# Patient Record
Sex: Female | Born: 1955 | Race: White | Hispanic: No | Marital: Married | Smoking: Former smoker
Health system: Southern US, Community
[De-identification: ages and names within clinical notes are randomized; demographics above are authoritative.]

## PROBLEM LIST (undated history)

## (undated) ENCOUNTER — Emergency Department (HOSPITAL_COMMUNITY): Admission: EM | Payer: BC Managed Care – PPO

## (undated) DIAGNOSIS — F319 Bipolar disorder, unspecified: Secondary | ICD-10-CM

## (undated) DIAGNOSIS — I10 Essential (primary) hypertension: Secondary | ICD-10-CM

## (undated) DIAGNOSIS — F32A Depression, unspecified: Secondary | ICD-10-CM

## (undated) DIAGNOSIS — J45909 Unspecified asthma, uncomplicated: Secondary | ICD-10-CM

## (undated) DIAGNOSIS — F419 Anxiety disorder, unspecified: Secondary | ICD-10-CM

## (undated) DIAGNOSIS — M1990 Unspecified osteoarthritis, unspecified site: Secondary | ICD-10-CM

## (undated) DIAGNOSIS — D649 Anemia, unspecified: Secondary | ICD-10-CM

## (undated) HISTORY — PX: TUBAL LIGATION: SHX77

## (undated) HISTORY — PX: BREAST SURGERY: SHX581

## (undated) HISTORY — PX: ABDOMINAL HYSTERECTOMY: SHX81

## (undated) HISTORY — PX: OTHER SURGICAL HISTORY: SHX169

---

## 2005-08-06 ENCOUNTER — Encounter: Admission: RE | Admit: 2005-08-06 | Discharge: 2005-08-06 | Payer: Self-pay | Admitting: Surgery

## 2005-08-06 ENCOUNTER — Ambulatory Visit (HOSPITAL_COMMUNITY): Admission: RE | Admit: 2005-08-06 | Discharge: 2005-08-06 | Payer: Self-pay | Admitting: Surgery

## 2005-08-07 ENCOUNTER — Ambulatory Visit (HOSPITAL_COMMUNITY): Admission: RE | Admit: 2005-08-07 | Discharge: 2005-08-07 | Payer: Self-pay | Admitting: Surgery

## 2005-10-22 ENCOUNTER — Encounter: Admission: RE | Admit: 2005-10-22 | Discharge: 2006-01-20 | Payer: Self-pay | Admitting: Surgery

## 2005-11-03 ENCOUNTER — Inpatient Hospital Stay (HOSPITAL_COMMUNITY): Admission: RE | Admit: 2005-11-03 | Discharge: 2005-11-05 | Payer: Self-pay | Admitting: Surgery

## 2005-11-04 ENCOUNTER — Encounter: Payer: Self-pay | Admitting: Vascular Surgery

## 2005-12-14 ENCOUNTER — Encounter: Admission: RE | Admit: 2005-12-14 | Discharge: 2006-03-14 | Payer: Self-pay | Admitting: Surgery

## 2005-12-28 ENCOUNTER — Emergency Department (HOSPITAL_COMMUNITY): Admission: EM | Admit: 2005-12-28 | Discharge: 2005-12-28 | Payer: Self-pay | Admitting: *Deleted

## 2006-05-11 ENCOUNTER — Encounter: Admission: RE | Admit: 2006-05-11 | Discharge: 2006-05-11 | Payer: Self-pay | Admitting: Family Medicine

## 2006-10-26 ENCOUNTER — Encounter: Admission: RE | Admit: 2006-10-26 | Discharge: 2006-10-26 | Payer: Self-pay | Admitting: Obstetrics and Gynecology

## 2006-12-23 ENCOUNTER — Emergency Department (HOSPITAL_COMMUNITY): Admission: EM | Admit: 2006-12-23 | Discharge: 2006-12-23 | Payer: Self-pay | Admitting: Emergency Medicine

## 2008-01-16 ENCOUNTER — Emergency Department (HOSPITAL_COMMUNITY): Admission: EM | Admit: 2008-01-16 | Discharge: 2008-01-16 | Payer: Self-pay | Admitting: Emergency Medicine

## 2008-01-17 ENCOUNTER — Ambulatory Visit (HOSPITAL_COMMUNITY): Admission: EM | Admit: 2008-01-17 | Discharge: 2008-01-18 | Payer: Self-pay | Admitting: Emergency Medicine

## 2008-04-17 ENCOUNTER — Inpatient Hospital Stay (HOSPITAL_COMMUNITY): Admission: RE | Admit: 2008-04-17 | Discharge: 2008-04-18 | Payer: Self-pay | Admitting: Obstetrics and Gynecology

## 2008-12-19 ENCOUNTER — Emergency Department (HOSPITAL_COMMUNITY): Admission: EM | Admit: 2008-12-19 | Discharge: 2008-12-19 | Payer: Self-pay | Admitting: Emergency Medicine

## 2010-09-02 NOTE — H&P (Signed)
[name], [name]                 ACCOUNT NO.:  [id number]   MEDICAL RECORD NO.:  [id number]          PATIENT TYPE:  AMB   LOCATION:  SDC                           FACILITY:  [facility]   PHYSICIAN:  Gerald Leitz, MD          DATE OF BIRTH:  [birth year]   DATE OF ADMISSION:  DATE OF DISCHARGE:                              HISTORY & PHYSICAL   The patient scheduled for surgery on April 17, 2008.   HISTORY OF PRESENT ILLNESS:  This is a 55 year old G3, P1-0-0-1 with  cystocele and rectocele with desires treatment via anterior-posterior  repair.   PAST GYN HISTORY:  Hysterectomy in 1990 secondary to menorrhagia with  total abdominal hysterectomy, bilateral oophorectomy performed at that  time as well.  No history of sexually transmitted diseases.   PAST MEDICAL HISTORY:  Depression and migraine headaches.   PAST OB HISTORY:  Spontaneous vaginal delivery x1 and miscarriage x2.   PAST SURGICAL HISTORY:  Abdominal hysterectomy and bilateral  oophorectomy in 1990, gastric bypass surgery in July 2007, bowel  obstruction in October 2009, hemorrhoidectomy x2 one in 1977 and one in  1983.   CURRENT MEDICATIONS:  Cymbalta, Topamax, and Abilify.   ALLERGIES:  No known drug allergies.   SOCIAL HISTORY:  The patient is married.  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  She denies tobacco use and  alcohol use.  No illicit drug use.  The patient had to be primary  caregiver for her mother who has a history of uterine and colon cancer.   FAMILY HISTORY:  Mother with uterine and colon cancer.  No history of  ovarian cancer.  No family history of breast cancer.   REVIEW OF SYSTEMS:  Negative except as stated in the history of present  illness.   PHYSICAL EXAMINATION:  VITAL SIGNS:  Blood pressure 130/64 and weight  175 pounds.  CARDIOVASCULAR:  Regular rate and rhythm.  LUNGS:  Clear to auscultation bilaterally.  ABDOMEN:  Soft, nontender and nondistended.  No masses.  PELVIC:   Normal external female genitalia.  The patient has underlying  moderate cystocele as well as a rectocele on exam.  Bimanual exam  reveals no masses.   IMPRESSION AND PLAN:  A 55 year old with cystocele and rectocele desires  treatment with anterior-posterior repair.  Risks, benefits, and  alternatives of the surgery were discussed with the patient including  but not limited to  infection and bleeding, damage to the bladder or rectum with the need  for further surgery, need for transfusion was discussed.  Risk of HIV  and hepatitis B and C.  The patient was understanding of all risk and  desires to proceed with anterior-posterior repair.      Gerald Leitz, MD  Electronically Signed     TC/MEDQ  D:  04/04/2008  T:  04/05/2008  Job:  [id number]

## 2010-09-02 NOTE — Op Note (Signed)
[name], [name]                 ACCOUNT NO.:  [id number]   MEDICAL RECORD NO.:  [id number]          PATIENT TYPE:  INP   LOCATION:  5118                         FACILITY:  [facility]   PHYSICIAN:  Sandria Bales. Ezzard Standing, M.D.  DATE OF BIRTH:  1956-02-21   DATE OF PROCEDURE:  01/17/2008  DATE OF DISCHARGE:                               OPERATIVE REPORT   Date of surgery ??   PREOPERATIVE DIAGNOSIS:  Abdominal pain status post rule out Roux-en-Y  gastric bypass.   POSTOPERATIVE DIAGNOSIS:  Small bowel obstruction secondary to adhesive  band.   Anatomy of gastric bypass appears normal.  No internal hernia.   PROCEDURE:  Laparoscopic lysis of adhesions.   SURGEON:  Sandria Bales. Ezzard Standing, MD   ASSISTANT:  None.   ANESTHESIA:  General endotracheal.   ESTIMATED BLOOD LOSS:  Minimal.   PROCEDURE:  [name] is a 55 year old white female, the patient of Dr.  Marny Lowenstein, who had a laparoscopic Roux-en-Y gastric bypass on November 03, 2005.  She has successfully lost over 100 pounds of weight and has  done very well from her bypass surgery.  She developed abdominal pain  yesterday and went to the emergency room, the films were nondiagnostic.  She returned to the emergency room today with continued abdominal pain.   I discussed with her and her husband about proceeding with laparoscopic  exploration.  I expressed concerns for possible internal hernias or  other cause for possible bowel obstruction, also talked about a possible  of open surgery, the risk of bowel resection, and upper endoscopy as  nothing else proves to be obvious.   OPERATIVE NOTE:  The patient was placed in supine position and given a  general endotracheal anesthetic.  She had both her arms tucked by her  side.  Her abdomen was prepped with Betadine solution, sterilely draped  and a time-out was held to identify the patient and the procedure.  She  was given 1 g of cefoxitin at the initiation of procedure.   I accessed the  abdominal cavity through an infraumbilical incision with  sharp dissection carried down to the abdominal cavity.  A 0-degree 10-mm  laparoscope was inserted through a 12-mm Hasson trocar.  A 5-mm trocar  was placed in the right lower quadrant of the abdomen.  A 5-mm trocar in  the left upper quadrant of the abdomen.   An abdominal exploration was carried out.  Her right and left lobes of  liver were unremarkable with the gallbladder that I could see was  unremarkable.  She did have dilated proximal small bowel started  actually up in her gastric pouch and followed the small bowel down into  what looked like it first I thought maybe an internal hernia, but  actually proved to be an adhesive band between 2 loops of bowel.  I was  able to expose the adhesive band and cut it without difficulty.  I then  was able to run her gastric limb all the way down to the  jejunojejunostomy, which looked good.  I saw no evidence of any  mesenteric hernia, though I was a little careful in trying to pull up on  the small bowel because it was dilated.  I thought I had identified the  cause of the obstruction.  I went back and went distally down her common  channel and back of her biliary channel and all these channels all  looked good.  I think I released the obstruction.   I irrigated the wounds, re-inspected the bowel that had been trapped  which looked okay.  I then removed the trocar and closed the umbilical  port with a 0 Vicryl suture, closed the skin fold with a 5-0 Vicryl  suture painted the wounds with a tincture of benzoin and Steri-Strips  and the patient tolerated the procedure well.   She was transported to recovery room in good condition.  Sponge and  needle counts were correct at the end of the case.      Sandria Bales. Ezzard Standing, M.D.  Electronically Signed     DHN/MEDQ  D:  01/17/2008  T:  01/18/2008  Job:  [id number]   cc:   Jethro Bastos, M.D.

## 2010-09-02 NOTE — H&P (Signed)
[name], [name]                 ACCOUNT NO.:  [id number]   MEDICAL RECORD NO.:  [id number]          PATIENT TYPE:  INP   LOCATION:  5118                         FACILITY:  [facility]   PHYSICIAN:  Sandria Bales. Ezzard Standing, M.D.  DATE OF BIRTH:  1955-11-05   DATE OF ADMISSION:  01/17/2008  DATE OF DISCHARGE:                              HISTORY & PHYSICAL   Date of H&P ??   HISTORY OF PRESENT ILLNESS:  This is a 55 year old white female who is a  patient of Dr. Marny Lowenstein who underwent a laparoscopic Roux-en-Y  gastric bypass on November 03, 2005, for morbid obesity.  Her initial weight  was 289 pounds with a BMI of 43.8.  I last saw her in May 2008 with a  weight of 176 pounds and a BMI of 26.7.   She did very well until Monday, January 16, 2008.  She was eating egg  at work where she started developing epigastric pain.  Because of  worsening pain, she came to the [facility] Emergency Room approximately  at 1:00 p.m. and stayed until 7:00 p.m. on Monday, January 16, 2008.  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 Arline Asp Romine for further evaluation of this.  Her pain seemed to get  better with pain medication; however, she had worsening pain once the  medicine wore off, and then today when speaking to Dr. Leda Quail  could not be seen for several days, came back to the [facility]  Emergency Room this time with worsening abdominal pain.   She has had some nausea and vomiting.  It is specific in her epigastric  and the left upper quadrant.  She has had no other GI problems other  than her bypass surgery.   She denies any liver disease, gallbladder disease, pancreatic disease,  or colon disease.   PAST MEDICAL HISTORY:  She has, I do not think, any allergies.   CURRENT MEDICATIONS:  1. Cymbalta.  2. Abilify.  3. Topamax.   REVIEW OF SYMPTOMS:  NEUROLOGIC:  She has had  headaches, seen Dr. Merceda Elks about a year ago, and uses Topamax as a prevention for headaches.  CARDIAC:  She has had no heart disease or chest pain.  PULMONARY:  She quit smoking for the bariatric surgery and then she  restarted smoking but quit smoking again this past December 2008.  She  has had no lung disease.  GASTROINTESTINAL:  See history of present illness.  UROLOGIC:  No history of kidney stones or kidney infections.  GYN:  She has had a hysterectomy in 1989.  EXTREMITIES:  Good strength in all 4 extremities.   Other surgeries had besides the bypass and hysterectomy, she had a  lumpectomy of her left breast and she had a hemorrhoidectomy x2, most  recently in 1983.   Her husband is at the bedside.  She works for [employer].   PHYSICAL EXAMINATION:  VITAL SIGNS:  Her temperature is  98.9, her pulse  is 70, respirations 18, and blood pressure 98/57.  HEENT:  Unremarkable.  NECK:  Supple.  I found no mass or thyromegaly.  LUNGS:  Clear to auscultation.  HEART:  Regular rate and rhythm without murmur or rub.  ABDOMEN:  She is tender on palpation in her epigastrium and the left  upper quadrant.  She has decreased but present bowel sounds.  I feel no  evidence of hernia, but she does have a little lipoma of her abdominal  wall, and this actually was seen on the CAT scan.  EXTREMITIES:  Good strength in all 4 extremities.  NEUROLOGIC:  Grossly intact.   I reviewed her ultrasound and CAT scan with Dr. Audie Pinto.  Even though  from a GI standpoint the CAT scan was read as normal, my concern is that  she appears to have stuff that hangs up near her jejunojejunostomy.  I  wonder whether she has either an internal hernia, some external band, or  some problem with her anastomosis.   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   Her white blood count is 13,000 with 81% neutrophils.  Her hemoglobin is  14 and hematocrit 43.   Sodium 137, potassium 3.9, chloride of 105,  glucose of 116.  Her liver functions were normal except for mildly  depressed protein of 5.9, lipase was 24.   IMPRESSION:  1. Status post Roux-en-Y gastric bypass with acute abdominal pain.      Concern for internal hernia versus some kind of partial      obstruction.  I discussed with the patient both laparoscopic and open repairs and the  possibility of open surgery and resection.  I think she understands all  this.  Risks include bleeding, infection, leakage from the bowel, and  complications that cannot be resolved with surgery.  1. Quit smoking for the second time in December.  2. Recurrent headaches.  3. Anxiety, on Cymbalta.  4. Lipoma of abdominal wall.  5. Desmoid of right adnexal area.      Sandria Bales. Ezzard Standing, M.D.  Electronically Signed     DHN/MEDQ  D:  01/17/2008  T:  01/18/2008  Job:  [id number]   cc:   Jethro Bastos, M.D.  Lum Keas, MD

## 2010-09-02 NOTE — Op Note (Signed)
NAMEALVETA, [name]                 ACCOUNT NO.:  [id number]   MEDICAL RECORD NO.:  [id number]          PATIENT TYPE:  OIB   LOCATION:  9315                          FACILITY:  [facility]   PHYSICIAN:  Gerald Leitz, MD          DATE OF BIRTH:  April 03, 1956   DATE OF PROCEDURE:  04/17/2008  DATE OF DISCHARGE:                               OPERATIVE REPORT   PREOPERATIVE DIAGNOSES:  1. Cystocele.  2. Varicocele.   POSTOPERATIVE DIAGNOSES:  1. Cystocele.  2. Varicocele.   PROCEDURE:  Anterior posterior repair.   SURGEON:  Gerald Leitz, MD   ESTIMATED BLOOD LOSS:  100 mL.   ANESTHESIA:  General.   COMPLICATIONS:  None.   SPECIMENS:  None.   PROCEDURE:  The patient was taken to the operating room where she was  placed under general anesthesia.  She was placed in a dorsal lithotomy  position and prepped and draped in the usual sterile fashion.  In the  ER, catheterization was performed prior to beginning the procedure.  The  vaginal cuff was grasped with Allis clamps.  Incision was made into the  anterior portion of the vaginal cuff with scalpel.  The vesicovaginal  space was developed by dissecting off the endopelvic connective tissue  from the vaginal epithelium using Metzenbaum scissors.  The pubocervical  septum was identified.  The pubocervical fascia was reapproximated using  interrupted 2-0 sutures of 0 Vicryl.  Cystocele was introduced.  A  redundant vaginal epithelium was excised.  The vaginal epithelium had  been closed with 2-0 Vicryl in a running lock fashion.  Excellent  hemostasis was noted.  Attention was turned to the rectocele where a  triangular incision was made in the perineum with the scalpel.  The  rectocele was dissected off from the vaginal mucosa using Metzenbaum  scissors.  Allis clips were used for retraction.  The rectovaginal  septum was identified and reapproximated with interrupted sutures of 3-0  Vicryl.  Incisions were tied and adequate support was noted.   The  rectocele was reduced.  The vaginal epithelium was reapproximated with  running stitch of 2-0  Vicryl.  Perineal body was reattached with 4-0 Vicryl.  Vaginal packing  with Estrace cream was placed.  Lap and needle counts were correct x2.  The patient was awakened from the anesthesia and returned to the  recovery room in awake and stable condition.      Gerald Leitz, MD  Electronically Signed     TC/MEDQ  D:  04/17/2008  T:  04/18/2008  Job:  [id number]

## 2010-09-02 NOTE — H&P (Signed)
[name], [name]                 ACCOUNT NO.:  [id number]   MEDICAL RECORD NO.:  [id number]          PATIENT TYPE:  INP   LOCATION:  5118                         FACILITY:  [facility]   PHYSICIAN:  Maisie Fus A. Cornett, M.D.DATE OF BIRTH:  [birth year]   DATE OF ADMISSION:  01/17/2008  DATE OF DISCHARGE:                              HISTORY & PHYSICAL   REQUESTING PHYSICIAN:  Dr. Dianne Dun in the ER.   SURGEON:  Maisie Fus A. Cornett, MD   GASTRIC BYPASS SURGEON:  Sandria Bales. Ezzard Standing, MD   CHIEF COMPLAINT:  Epigastric abdominal pain.   HISTORY OF PRESENT ILLNESS:  [name] is a 55 year old white female  with a history of obesity and gastric bypass surgery approximately 2  years ago by Dr. Ezzard Standing.  The patient is no longer overweight.  Currently, the patient presents today to the Emergency Department with a  2-day history of severe abdominal pain.  She states that she began  having this pain yesterday around 8:30 in the morning after eating an  almond.  This pain is described as very severe epigastric pain.  She  states that this pain radiates to both her right upper quadrant and her  left upper quadrant as well as to her back.  Later, she went to work,  however, ended up having to leave work due to the severity of pain.  Due  to this amount of pain, the patient presented to [facility],  where a CT scan of the abdomen and pelvis as well as transvaginal  ultrasound and a pelvic ultrasound were completed, which were all  essentially normal except for a right dermoid ovarian cyst.  At that  time, the patient was sent home with a prescription for Percocet for  pain, which did not help her pain at all.  She states her last bowel  movement was yesterday.  She did pass some flatus yesterday, however,  has not passed any flatus today.  This morning she woke up, and her pain  persisted.  She states that due to continued worsening in her pain, she  presented back to the Emergency Department  today.  At this time, she had  a complete abdominal ultrasound, which was negative showing no  pericholecystic fluid, no gallstones, no common bile duct dilatation,  and no other intra-abdominal abnormalities on ultrasound.  At this time,  her LFTs and all other labs were essentially normal with the exception  of a white blood cell count of 13,000 as well as a neutrophil count of  81%.  The patient has had nausea as well as dry heaving.  At this time,  we were asked to see the patient by the emergency room physician who had  thought that this abdominal pain may be related to the patient's history  of gastric bypass surgery.   REVIEW OF SYSTEMS:  Apparently, the patient has had a decrease in urine  output within the past several days.  She states she had a small amount  last night as well as this morning, but otherwise has not been able to  urinate  since.  Otherwise, please see HPI.  The patient denies any chest  pain or shortness of breath.  Otherwise, all other systems are negative.   FAMILY HISTORY:  Her mother has a history of ovarian as well as cervical  cancer, which later metastasized to her spine.  Also, a history with her  mother of primary colon cancer.   PAST MEDICAL HISTORY:  1. History of obesity for which she is status post gastric bypass and      no longer obese.  2. Irritable bowel syndrome.  3. Depression.   PAST SURGICAL HISTORY:  Gastric bypass surgery.   SOCIAL HISTORY:  The patient is married.  She recently stopped smoking  this past December 2008.  She has been an occasional drinker  approximately 1-2 times a week approximately 4-5 beers each time,  however, she states that she does not do this every week.  She currently  works for a [employer].   ALLERGIES:  NKDA.   MEDICATIONS:  Cymbalta, Topamax, Abilify, calcium, iron, multivitamin,  as well as a probiotic, doses are unknown.   PHYSICAL EXAMINATION:  GENERAL:  [name] is a 55 year old  white  female, who is a very pleasant, lying in bed currently, and in mild  distress.  VITAL SIGNS:  Temperature 99.5, blood pressure 115/76, pulse 72,  respirations 18.  EYES:  Sclerae nonicteric.  Pupils were equal, round, and reactive to  light.  EARS, NOSE, and THROAT:  Without any obvious rashes or lesions.  No  rhinorrhea.  Throat shows no exudate.  MOUTH:  Pink and moist.  NECK:  Supple.  Trachea is midline.  No thyromegaly.  HEART:  Regular rate and rhythm.  Normal S1 and S2.  No murmurs,  gallops, or rubs were noted.  LUNGS:  Clear to auscultation bilaterally.  No wheezes, rhonchi, or  rales are noted.  Respiratory effort is nonlabored.  ABDOMEN:  Soft.  Extremely tender in the epigastric region as well as  the right upper quadrant and left upper quadrant.  The patient does have  active guarding as well as rebound tenderness.  Currently, she does not  have any bowel sounds and is nondistended.  Otherwise, a small little  mass is noted under one of her prior laparoscopic scars, which I feel at  this time is probably some scar tissue.  Otherwise, no other masses or  hernias are felt.  MUSCULOSKELETAL:  All four extremities are symmetrical.  No cyanosis,  clubbing, or edema.  SKIN:  Warm and dry.  No obvious masses, lesions, or rashes.  NEUROLOGIC:  Cranial nerves II through XII appear to be grossly intact.  PSYCH:  The patient is alert and oriented x3 with appropriate affect.   LABS AND DIAGNOSTICS:  White blood count 13,000, hemoglobin 14.7,  hematocrit 43.6, platelet count 249,000, neutrophils 81%.  Sodium 137,  potassium 3.9, glucose 116, BUN 8, creatinine 0.63.  LFTs are all  normal.  Lipase 24.   DIAGNOSTICS:  An acute abdominal series shows no acute cardiopulmonary  abnormalities, nonobstructive bowel-gas pattern, and no free air.  Abdominal ultrasound shows a normal-appearing gallbladder with no  gallbladder wall thickening, no pericholecystic fluid, no common bile   duct dilatation, and no gallstones.  No other intraabdominal  abnormalities were seen on ultrasound either.   IMPRESSION:  1. Epigastric abdominal pain with unknown etiology.  2. Depression.  3. Dehydration.   PLAN:  At this time, I have discussed this case with Dr. Ezzard Standing, who  is  the surgeon who performed Ms. Krejci's gastric bypass surgery  approximately 2 years ago.  At this time, due to her severe pain as well  as her diagnostics and labs, it appear to be ruling out problems with  possible cholecystitis or any other bowel obstruction type symptoms.  Dr. Ezzard Standing feels that this patient probably needs to go to the OR  tonight for a diagnostic laparoscopy.  Due to the patient's history of  gastric bypass surgery, it does put her at increased risk for a  possibility of an internal hernia as well as possible gastric ulcers.  At this time, we will prophylactically give the patient 1 g of cefoxitin  on-call to the OR.  We will also give her various p.r.n. medications  such as Dilaudid, Zofran, and Phenergan for nausea as well as pain.  Otherwise, I have explained the procedure to the patient and described  some other risks and benefits for this procedure and she currently  wishes to proceed.  A consent for a laparoscopy with possible  laparotomy, possible endoscopy, and possible small bowel resection has  been written for as well.      Letha Cape, PA      Maisie Fus A. Cornett, M.D.  Electronically Signed    KEO/MEDQ  D:  01/17/2008  T:  01/18/2008  Job:  [id number]   cc:   Sandria Bales. Ezzard Standing, M.D.  Jethro Bastos, M.D.

## 2010-09-05 NOTE — Discharge Summary (Signed)
NAMEKORRI, ASK                 ACCOUNT NO.:  [id number]   MEDICAL RECORD NO.:  [id number]          PATIENT TYPE:  INP   LOCATION:  9315                          FACILITY:  [facility]   PHYSICIAN:  Gerald Leitz, MD          DATE OF BIRTH:  Jul 14, 1955   DATE OF ADMISSION:  04/17/2008  DATE OF DISCHARGE:  04/18/2008                               DISCHARGE SUMMARY   INDICATION FOR ADMISSION:  1. Cystocele.  2. Rectocele.   DISCHARGE DIAGNOSES:  1. Cystocele.  2. Rectocele.  3. Status post anterior and posterior repair.   BRIEF HOSPITAL COURSE:  The patient underwent anterior and posterior  repair on April 17, 2008.  She did well postoperatively, was  discharged home on postop day #1.  Hemoglobin at discharge was 11.8.  Pain was well controlled.  She is scheduled to follow up for  postoperative visit in 2 weeks and discharged home on the following  medications, Motrin and Percocet.   CONDITION AT DISCHARGE:  Stable and improved.      Gerald Leitz, MD  Electronically Signed     TC/MEDQ  D:  06/02/2008  T:  06/03/2008  Job:  [id number]

## 2010-09-05 NOTE — Op Note (Signed)
[name], [name]                 ACCOUNT NO.:  [id number]   MEDICAL RECORD NO.:  [id number]          PATIENT TYPE:  INP   LOCATION:  1517                         [facility]AC[facility]   PHYSICIAN:  Sharlet Salina T. Hoxworth, M.D.DATE OF BIRTH:  1956/03/28   DATE OF PROCEDURE:  11/03/2005  DATE OF DISCHARGE:                                 OPERATIVE REPORT   PROCEDURE:  Upper GI endoscopy.   DESCRIPTION OF PROCEDURE:  Upper GI endoscopy is performed intraoperatively  at the completion of laparoscopic Roux-en-Y gastric bypass by Dr. Ovidio Kin.  With the outlet of the gastric pouch clamped by Dr. Ezzard Standing and with  the pouch and anastomosis under saline irrigation, the Olympus video  endoscope was passed into the upper esophagus and then advanced under direct  vision to the EG junction at 40 cm from the incisors.  The small gastric  pouch was entered and then tensely distended with air, and there was no  evidence of leak.  The anastomosis was visualized,  appeared patent.  Suture  and staple lines were intact and without bleeding.  The pouch was measured  at 4-5 cm in length.  Following completion of the procedure, the pouch was  desufflated and the scope withdrawn.      Lorne Skeens. Hoxworth, M.D.  Electronically Signed     BTH/MEDQ  D:  11/03/2005  T:  11/03/2005  Job:  [id number]

## 2010-09-05 NOTE — Op Note (Signed)
[name], [name]                 ACCOUNT NO.:  [id number]   MEDICAL RECORD NO.:  [id number]          PATIENT TYPE:  INP   LOCATION:  0001                         FACILITY:  [facility]   PHYSICIAN:  Sandria Bales. Ezzard Standing, M.D.  DATE OF BIRTH:  [birth year]   DATE OF PROCEDURE:  11/03/2005  DATE OF DISCHARGE:                                 OPERATIVE REPORT   PREOPERATIVE DIAGNOSIS:  Morbid obesity, weight 289, BMI 43.8.   POSTOPERATIVE DIAGNOSIS:  Morbid obesity, weight 289, BMI 43.8.   OPERATION PERFORMED:  Laparoscopic Roux-en-Y gastrojejunostomy (anticolic,  antigastric) and division of omentum.   SURGEON:  Sandria Bales. Ezzard Standing, M.D.   ASSISTANT:  Sharlet Salina T. Hoxworth, M.D.   ANESTHESIA:  General endotracheal.   ESTIMATED BLOOD LOSS:  Minimal.   INDICATIONS FOR PROCEDURE:  Ms. Wegman is a 55 year old white female who is  a patient of Jethro Bastos, M.D., who has been morbidly obese much of  her adult life.  She was into our preoperative bariatric program which has  included nutritional evaluation, psych evaluation, preop labs and x-rays.   She now comes for attempted laparoscopy Roux-en-Y gastric bypass.  The  indications and potential complications of the procedure were explained to  the patient.  Potential complications include but not limited to bleeding  infection, bowel leak or injury, deep venous thrombosis and long term  nutritional changes.   DESCRIPTION OF PROCEDURE:  Patient in supine position, given a general  endotracheal anesthesia.  She had PAS stockings in place, Foley catheter in  place, was given antibiotics preoperatively.   Her abdomen was prepped with Betadine solution and sterilely draped.  I  accessed her abdominal cavity with a 12 mm Opti-view trocar.  With this I  placed a 10 mm 30 degree laparoscope.  I placed of seven trocars.  There was  a 5 mm trocar lateral left upper quadrant. There was the Opti-view I just  described.  There was a trocar to the left  of midline in her upper abdomen.  There was a right paramedian trocar which was 12 mm.  There was a 12 mm  right subcostal, a 5 mm subxiphoid and a 10 mm to the left of the umbilicus.   Abdominal exploration was carried out initially.  The liver was noted to be  fatty, the stomach was unremarkable.  Her bowel was just covered with a  large amount of omentum.  She did have one attachment of the omentum down  toward her Pfannenstiel incision which was about 3 or 4 cm attachment.  I  took this down with a Harmonic scalpel.   I then went up and identified the ligament of Treitz and counted down 40 cm  down the jejunum.  I then divided the jejunum with a white load of the endo  GIA 45 mm stapler and took down the mesentery.  I then counted the future  gastric limb of 100 cm of jejunum.  I had marked the proximal gastric limb  with a Penrose drain and went down distally until I had counted 100 cm.  I  then a side-to-side jejunojejunostomy with a 45 mm stapler and I closed the  enterotomy with two running 2-0 Vicryl sutures.   I then closed the mesenteric defect with a running 2-0 silk suture with  Laparoties on both ends of this.   The patient was then placed in a reverse Trendelenburg position.  The  Nathanson retractor was placed through the subxiphoid 5 mm port and  retracted the left lobe of the liber which was moderately fatty in nature.   What was impressive, she had a lot of fat in her upper abdomen with a large  fat pad probably 3 or 4 cm that overlay her gastroesophageal junction.  I  first identified the angle of His in which I opened up a window about 2 cm  down along the left crus.  I then went to the lesser sac and tried to go  about 4 to 5 cm below the gastroesophageal junction and got into the lesser  sac through the lesser curvature of the stomach.   I actually took the anterior fat pad up about 2 cm to expose the anterior  wall of the stomach.   I then divided the  stomach first with a blue load of the 45 Endo GIA stapler  and I used two 60 staplers with the gold load.  Anesthesia then passed an  Ewald tube down into the stomach to make sure there was no compromise of the  esophagus and this passed easily.  Then I did my final firing with a 45 blue  load of the Endo GIA stapler.   So I created a new stomach pouch approximately 4 to 5 cm in length and 3 cm  in width and had disconnected distal stomach.  I did have a single bleeder  on the new pouch side which I put a stitch in which controlled the bleeding.  I then oversewed the gastric remnant using a locking 2-0 Vicryl suture.   I tried pulling up the jejunum to the stomach but there was a moderate  amount of tension and she had so much omentum I thought she would be best  served by having this divided, so I spent probably 15 minutes dividing the  omentum up to within about 1 or 2 cm of the colon.  I then brought up the  jejunum anticolic and antigastric and this time it reached much better and  did a J-hook which placed to the left side attached to the stomach pouch. I  put a posterior running 2-0 Vicryl suture.  I then did an Endo GIA stapler  trying to create about a 2.5 cm opening between the stomach and the jejunum.  There was some bleeding along the stomach side.  I closed the gastrojejunal  enterotomy with two running 2-0 Vicryl sutures and this controlled the  little bleeding on the stomach side.  I placed an extra stitch along one  area that I was worried about a possible leak.  I then did an anterior  running 2-0 Vicryl suture.   I then closed the Peterson's defect, finding the colon that I tacked to the  mesentery of the candy cane of the jejunum and then through the omentum and  fat.  I used Laparoties on this.   We then withdrew the Ewald tube.  Dr. Johna Sheriff broke scrub and went up and  did the upper endoscopy.  He identified about a 4 to 5 cm patch with a widely patent  gastrojejunostomy and  no evidence of bleeding.  While he  insufflated air, I grasped the small bowel and he put it under tension. I  flooded the upper abdomen with saline, saw no bubbles or evidence of air  leak.   He then removed the endoscope.  I then irrigated out the abdomen.  I placed  a seal to JJ anastomosis.  I placed a seal up along the new gastric pouch  which was right up at the angle of His and I finally placed Tisseel along  the gastrojejunostomy and over the jejunal stump.  I used a total of about 5  mL of Tisseel.   I then reinspected the divided omentum.  I saw no bleeding.  I looked back  down the pelvis and saw no bleeding where the omentum had been taken off the  anterior abdominal wall.  I then removed the trocars in turn under direct  visualization.  There was no bleeding at any trocar site.   The wounds were then cleaned off.  I then stapled each wound.  I had  infiltrated approximately 20 mL of 0.25% Marcaine in the different trocar  sites.  Sponge and needle counts were correct at the end of this case.  She  tolerated the procedure well and was transported to recovery room in good  condition.      Sandria Bales. Ezzard Standing, M.D.  Electronically Signed     DHN/MEDQ  D:  11/03/2005  T:  11/03/2005  Job:  [id number]   cc:   Jethro Bastos, M.D.  Fax: [phone]

## 2011-01-19 LAB — COMPREHENSIVE METABOLIC PANEL
ALT: 32
AST: 20
BUN: 8
CO2: 26
Calcium: 9.1
Chloride: 105
Creatinine, Ser: 0.63
GFR calc Af Amer: >60
Potassium: 3.9
Sodium: 137

## 2011-01-19 LAB — DIFFERENTIAL
Basophils Absolute: 0
Basophils Absolute: 0
Basophils Relative: 0
Basophils Relative: 0
Eosinophils Relative: 4
Lymphs Abs: 1.9
Monocytes Absolute: 0.4
Monocytes Relative: 5
Monocytes Relative: 6
Neutro Abs: 10.5 — ABNORMAL HIGH
Neutrophils Relative %: 81 — ABNORMAL HIGH

## 2011-01-19 LAB — CBC
HCT: 39.4
HCT: 43.6
Hemoglobin: 14.7
MCHC: 33.6
MCV: 97
Platelets: 239
WBC: 8.2

## 2011-01-19 LAB — URINALYSIS, ROUTINE W REFLEX MICROSCOPIC
Glucose, UA: NEGATIVE
Ketones, ur: 40 — AB
Specific Gravity, Urine: >1.046 — ABNORMAL HIGH
pH: 6

## 2011-01-19 LAB — BASIC METABOLIC PANEL
Calcium: 9.4
Chloride: 106
Creatinine, Ser: 0.7
Glucose, Bld: 113 — ABNORMAL HIGH
Potassium: 3.6

## 2011-01-19 LAB — HEPATIC FUNCTION PANEL
AST: 21
Bilirubin, Direct: 0.1

## 2011-01-23 LAB — CBC
MCHC: 33.6 g/dL (ref 30.0–36.0)
MCV: 97.4 fL (ref 78.0–100.0)
Platelets: 171 10*3/uL (ref 150–400)
RDW: 13.1 % (ref 11.5–15.5)
RDW: 13.5 % (ref 11.5–15.5)
WBC: 8.5 10*3/uL (ref 4.0–10.5)

## 2011-01-23 LAB — BASIC METABOLIC PANEL
BUN: 10 mg/dL (ref 6–23)
CO2: 26 mEq/L (ref 19–32)
Calcium: 8.4 mg/dL (ref 8.4–10.5)
Creatinine, Ser: 0.54 mg/dL (ref 0.4–1.2)
GFR calc Af Amer: >60 mL/min (ref >60)
Glucose, Bld: 89 mg/dL (ref 70–99)

## 2011-01-23 LAB — URINALYSIS, ROUTINE W REFLEX MICROSCOPIC
Protein, ur: NEGATIVE mg/dL
Urobilinogen, UA: 0.2 mg/dL (ref 0.0–1.0)

## 2011-01-23 LAB — TYPE AND SCREEN
ABO/RH(D): A POS
Antibody Screen: NEGATIVE

## 2020-02-24 ENCOUNTER — Inpatient Hospital Stay (HOSPITAL_COMMUNITY)
Admission: EM | Admit: 2020-02-24 | Discharge: 2020-03-01 | DRG: 493 | Disposition: A | Discharge status: Rehabilitation Facility | Payer: BC Managed Care – PPO | Attending: Internal Medicine | Admitting: Internal Medicine

## 2020-02-24 DIAGNOSIS — T148XXA Other injury of unspecified body region, initial encounter: Secondary | ICD-10-CM

## 2020-02-24 DIAGNOSIS — S82132A Displaced fracture of medial condyle of left tibia, initial encounter for closed fracture: Secondary | ICD-10-CM

## 2020-02-24 DIAGNOSIS — E559 Vitamin D deficiency, unspecified: Secondary | ICD-10-CM | POA: Diagnosis present

## 2020-02-24 DIAGNOSIS — F319 Bipolar disorder, unspecified: Secondary | ICD-10-CM | POA: Diagnosis present

## 2020-02-24 DIAGNOSIS — W010XXA Fall on same level from slipping, tripping and stumbling without subsequent striking against object, initial encounter: Secondary | ICD-10-CM | POA: Diagnosis present

## 2020-02-24 DIAGNOSIS — E222 Syndrome of inappropriate secretion of antidiuretic hormone: Secondary | ICD-10-CM | POA: Diagnosis present

## 2020-02-24 DIAGNOSIS — M79662 Pain in left lower leg: Secondary | ICD-10-CM | POA: Diagnosis not present

## 2020-02-24 DIAGNOSIS — Z79899 Other long term (current) drug therapy: Secondary | ICD-10-CM

## 2020-02-24 DIAGNOSIS — Z20822 Contact with and (suspected) exposure to covid-19: Secondary | ICD-10-CM | POA: Diagnosis present

## 2020-02-24 DIAGNOSIS — Y92008 Other place in unspecified non-institutional (private) residence as the place of occurrence of the external cause: Secondary | ICD-10-CM

## 2020-02-24 DIAGNOSIS — I10 Essential (primary) hypertension: Secondary | ICD-10-CM | POA: Diagnosis present

## 2020-02-24 DIAGNOSIS — S82142A Displaced bicondylar fracture of left tibia, initial encounter for closed fracture: Principal | ICD-10-CM | POA: Diagnosis present

## 2020-02-24 DIAGNOSIS — D62 Acute posthemorrhagic anemia: Secondary | ICD-10-CM

## 2020-02-24 DIAGNOSIS — Z87891 Personal history of nicotine dependence: Secondary | ICD-10-CM

## 2020-02-24 DIAGNOSIS — E871 Hypo-osmolality and hyponatremia: Secondary | ICD-10-CM | POA: Diagnosis present

## 2020-02-24 DIAGNOSIS — Z419 Encounter for procedure for purposes other than remedying health state, unspecified: Secondary | ICD-10-CM

## 2020-02-24 DIAGNOSIS — Z6835 Body mass index (BMI) 35.0-35.9, adult: Secondary | ICD-10-CM

## 2020-02-24 DIAGNOSIS — S82143A Displaced bicondylar fracture of unspecified tibia, initial encounter for closed fracture: Secondary | ICD-10-CM | POA: Diagnosis present

## 2020-02-24 DIAGNOSIS — G8918 Other acute postprocedural pain: Secondary | ICD-10-CM

## 2020-02-24 DIAGNOSIS — G4700 Insomnia, unspecified: Secondary | ICD-10-CM | POA: Diagnosis present

## 2020-02-24 DIAGNOSIS — F1020 Alcohol dependence, uncomplicated: Secondary | ICD-10-CM | POA: Diagnosis present

## 2020-02-24 DIAGNOSIS — K5900 Constipation, unspecified: Secondary | ICD-10-CM | POA: Diagnosis not present

## 2020-02-24 DIAGNOSIS — T502X5A Adverse effect of carbonic-anhydrase inhibitors, benzothiadiazides and other diuretics, initial encounter: Secondary | ICD-10-CM | POA: Diagnosis present

## 2020-02-24 NOTE — ED Triage Notes (Signed)
To triage via EMS.  Pt was transferred from EMS stretcher to ED stretcher.  Pt has h/o left femur fx that [place], [place] there but when came back to [place] surgery had to be redone.  Left leg is 6" shorter than right and has chronic low back and left leg pain.   Onset tonight pt was walking to [place] with no assistive devices and as she was stepping over grand kids and animals, she stepped down on left foot and immediately felt and heard a ripping and tearing in left leg.

## 2020-02-25 ENCOUNTER — Emergency Department (HOSPITAL_COMMUNITY): Payer: BC Managed Care – PPO

## 2020-02-25 ENCOUNTER — Observation Stay (HOSPITAL_COMMUNITY): Payer: BC Managed Care – PPO

## 2020-02-25 DIAGNOSIS — K5903 Drug induced constipation: Secondary | ICD-10-CM | POA: Diagnosis not present

## 2020-02-25 DIAGNOSIS — F411 Generalized anxiety disorder: Secondary | ICD-10-CM | POA: Diagnosis not present

## 2020-02-25 DIAGNOSIS — F1099 Alcohol use, unspecified with unspecified alcohol-induced disorder: Secondary | ICD-10-CM

## 2020-02-25 DIAGNOSIS — S82132S Displaced fracture of medial condyle of left tibia, sequela: Secondary | ICD-10-CM | POA: Diagnosis not present

## 2020-02-25 DIAGNOSIS — D62 Acute posthemorrhagic anemia: Secondary | ICD-10-CM | POA: Diagnosis not present

## 2020-02-25 DIAGNOSIS — E871 Hypo-osmolality and hyponatremia: Secondary | ICD-10-CM | POA: Diagnosis not present

## 2020-02-25 DIAGNOSIS — T502X5A Adverse effect of carbonic-anhydrase inhibitors, benzothiadiazides and other diuretics, initial encounter: Secondary | ICD-10-CM | POA: Diagnosis present

## 2020-02-25 DIAGNOSIS — G90522 Complex regional pain syndrome I of left lower limb: Secondary | ICD-10-CM | POA: Diagnosis not present

## 2020-02-25 DIAGNOSIS — Z87891 Personal history of nicotine dependence: Secondary | ICD-10-CM | POA: Diagnosis not present

## 2020-02-25 DIAGNOSIS — W19XXXA Unspecified fall, initial encounter: Secondary | ICD-10-CM | POA: Diagnosis not present

## 2020-02-25 DIAGNOSIS — Z79899 Other long term (current) drug therapy: Secondary | ICD-10-CM | POA: Diagnosis not present

## 2020-02-25 DIAGNOSIS — R52 Pain, unspecified: Secondary | ICD-10-CM | POA: Diagnosis not present

## 2020-02-25 DIAGNOSIS — S82141A Displaced bicondylar fracture of right tibia, initial encounter for closed fracture: Secondary | ICD-10-CM | POA: Diagnosis not present

## 2020-02-25 DIAGNOSIS — S82142D Displaced bicondylar fracture of left tibia, subsequent encounter for closed fracture with routine healing: Secondary | ICD-10-CM | POA: Diagnosis not present

## 2020-02-25 DIAGNOSIS — M79662 Pain in left lower leg: Secondary | ICD-10-CM | POA: Diagnosis present

## 2020-02-25 DIAGNOSIS — K5900 Constipation, unspecified: Secondary | ICD-10-CM | POA: Diagnosis not present

## 2020-02-25 DIAGNOSIS — E222 Syndrome of inappropriate secretion of antidiuretic hormone: Secondary | ICD-10-CM | POA: Diagnosis present

## 2020-02-25 DIAGNOSIS — S82143A Displaced bicondylar fracture of unspecified tibia, initial encounter for closed fracture: Secondary | ICD-10-CM | POA: Diagnosis present

## 2020-02-25 DIAGNOSIS — I10 Essential (primary) hypertension: Secondary | ICD-10-CM | POA: Diagnosis present

## 2020-02-25 DIAGNOSIS — S82132D Displaced fracture of medial condyle of left tibia, subsequent encounter for closed fracture with routine healing: Secondary | ICD-10-CM | POA: Diagnosis not present

## 2020-02-25 DIAGNOSIS — G4700 Insomnia, unspecified: Secondary | ICD-10-CM | POA: Diagnosis present

## 2020-02-25 DIAGNOSIS — R3915 Urgency of urination: Secondary | ICD-10-CM | POA: Diagnosis not present

## 2020-02-25 DIAGNOSIS — G8918 Other acute postprocedural pain: Secondary | ICD-10-CM | POA: Diagnosis not present

## 2020-02-25 DIAGNOSIS — S82142A Displaced bicondylar fracture of left tibia, initial encounter for closed fracture: Secondary | ICD-10-CM | POA: Diagnosis present

## 2020-02-25 DIAGNOSIS — F1020 Alcohol dependence, uncomplicated: Secondary | ICD-10-CM | POA: Diagnosis present

## 2020-02-25 DIAGNOSIS — R350 Frequency of micturition: Secondary | ICD-10-CM | POA: Diagnosis not present

## 2020-02-25 DIAGNOSIS — M7989 Other specified soft tissue disorders: Secondary | ICD-10-CM | POA: Diagnosis not present

## 2020-02-25 DIAGNOSIS — W010XXA Fall on same level from slipping, tripping and stumbling without subsequent striking against object, initial encounter: Secondary | ICD-10-CM | POA: Diagnosis present

## 2020-02-25 DIAGNOSIS — G479 Sleep disorder, unspecified: Secondary | ICD-10-CM | POA: Diagnosis not present

## 2020-02-25 DIAGNOSIS — Z6835 Body mass index (BMI) 35.0-35.9, adult: Secondary | ICD-10-CM | POA: Diagnosis not present

## 2020-02-25 DIAGNOSIS — F319 Bipolar disorder, unspecified: Secondary | ICD-10-CM | POA: Diagnosis present

## 2020-02-25 DIAGNOSIS — L299 Pruritus, unspecified: Secondary | ICD-10-CM | POA: Diagnosis not present

## 2020-02-25 DIAGNOSIS — Z20822 Contact with and (suspected) exposure to covid-19: Secondary | ICD-10-CM | POA: Diagnosis present

## 2020-02-25 DIAGNOSIS — E559 Vitamin D deficiency, unspecified: Secondary | ICD-10-CM | POA: Diagnosis present

## 2020-02-25 DIAGNOSIS — Y92008 Other place in unspecified non-institutional (private) residence as the place of occurrence of the external cause: Secondary | ICD-10-CM | POA: Diagnosis not present

## 2020-02-25 LAB — RESPIRATORY PANEL BY RT PCR (FLU A&B, COVID)
Influenza A by PCR: NEGATIVE
Influenza B by PCR: NEGATIVE
SARS Coronavirus 2 by RT PCR: NEGATIVE

## 2020-02-25 LAB — BASIC METABOLIC PANEL
Anion gap: 12 (ref 5–15)
Anion gap: 11 (ref 5–15)
Anion gap: 10 (ref 5–15)
BUN: 6 mg/dL — ABNORMAL LOW (ref 8–23)
BUN: 5 mg/dL — ABNORMAL LOW (ref 8–23)
BUN: 5 mg/dL — ABNORMAL LOW (ref 8–23)
CO2: 26 mmol/L (ref 22–32)
CO2: 29 mmol/L (ref 22–32)
CO2: 28 mmol/L (ref 22–32)
Calcium: 8.4 mg/dL — ABNORMAL LOW (ref 8.9–10.3)
Calcium: 8.6 mg/dL — ABNORMAL LOW (ref 8.9–10.3)
Calcium: 8.6 mg/dL — ABNORMAL LOW (ref 8.9–10.3)
Chloride: 86 mmol/L — ABNORMAL LOW (ref 98–111)
Chloride: 89 mmol/L — ABNORMAL LOW (ref 98–111)
Chloride: 91 mmol/L — ABNORMAL LOW (ref 98–111)
Creatinine, Ser: 0.48 mg/dL (ref 0.44–1.00)
Creatinine, Ser: 0.57 mg/dL (ref 0.44–1.00)
Creatinine, Ser: 0.59 mg/dL (ref 0.44–1.00)
GFR, Estimated: >60 mL/min (ref >60)
GFR, Estimated: >60 mL/min (ref >60)
GFR, Estimated: >60 mL/min (ref >60)
Glucose, Bld: 123 mg/dL — ABNORMAL HIGH (ref 70–99)
Glucose, Bld: 121 mg/dL — ABNORMAL HIGH (ref 70–99)
Glucose, Bld: 117 mg/dL — ABNORMAL HIGH (ref 70–99)
Potassium: 3.5 mmol/L (ref 3.5–5.1)
Potassium: 3.7 mmol/L (ref 3.5–5.1)
Potassium: 3.7 mmol/L (ref 3.5–5.1)
Sodium: 124 mmol/L — ABNORMAL LOW (ref 135–145)
Sodium: 129 mmol/L — ABNORMAL LOW (ref 135–145)
Sodium: 129 mmol/L — ABNORMAL LOW (ref 135–145)

## 2020-02-25 LAB — HEPATIC FUNCTION PANEL
ALT: 29 U/L (ref 0–44)
AST: 27 U/L (ref 15–41)
Albumin: 3.4 g/dL — ABNORMAL LOW (ref 3.5–5.0)
Alkaline Phosphatase: 80 U/L (ref 38–126)
Bilirubin, Direct: 0.2 mg/dL (ref 0.0–0.2)
Indirect Bilirubin: 0.5 mg/dL (ref 0.3–0.9)
Total Bilirubin: 0.7 mg/dL (ref 0.3–1.2)
Total Protein: 5.6 g/dL — ABNORMAL LOW (ref 6.5–8.1)

## 2020-02-25 LAB — CBC WITH DIFFERENTIAL/PLATELET
Abs Immature Granulocytes: 0.04 10*3/uL (ref 0.00–0.07)
Basophils Absolute: 0 10*3/uL (ref 0.0–0.1)
Basophils Relative: 0 %
Eosinophils Absolute: 0 10*3/uL (ref 0.0–0.5)
Eosinophils Relative: 0 %
HCT: 37.2 % (ref 36.0–46.0)
Hemoglobin: 13.1 g/dL (ref 12.0–15.0)
Immature Granulocytes: 0 %
Lymphocytes Relative: 10 %
Lymphs Abs: 1 10*3/uL (ref 0.7–4.0)
MCH: 31.7 pg (ref 26.0–34.0)
MCHC: 35.2 g/dL (ref 30.0–36.0)
MCV: 90.1 fL (ref 80.0–100.0)
Monocytes Absolute: 0.9 10*3/uL (ref 0.1–1.0)
Monocytes Relative: 9 %
Neutro Abs: 8.2 10*3/uL — ABNORMAL HIGH (ref 1.7–7.7)
Neutrophils Relative %: 81 %
Platelets: 277 10*3/uL (ref 150–400)
RBC: 4.13 MIL/uL (ref 3.87–5.11)
RDW: 12.4 % (ref 11.5–15.5)
WBC: 10.1 10*3/uL (ref 4.0–10.5)
nRBC: 0 % (ref 0.0–0.2)

## 2020-02-25 LAB — VITAMIN D 25 HYDROXY (VIT D DEFICIENCY, FRACTURES): Vit D, 25-Hydroxy: 18.03 ng/mL — ABNORMAL LOW (ref 30–100)

## 2020-02-25 LAB — HIV ANTIBODY (ROUTINE TESTING W REFLEX): HIV Screen 4th Generation wRfx: NONREACTIVE

## 2020-02-25 MED ORDER — VITAMIN D (ERGOCALCIFEROL) 1.25 MG (50000 UNIT) PO CAPS
50000.0000 [IU] | ORAL_CAPSULE | ORAL | Status: DC
  Administered 2020-02-26: 50000 [IU] via ORAL
  Filled 2020-02-25 (×2): qty 1

## 2020-02-25 MED ORDER — SODIUM CHLORIDE 0.9 % IV SOLN: INTRAVENOUS | Status: DC

## 2020-02-25 MED ORDER — SENNA 8.6 MG PO TABS
1.0000 | ORAL_TABLET | Freq: Two times a day (BID) | ORAL | Status: DC
  Administered 2020-02-25 – 2020-03-01 (×10): 8.6 mg via ORAL
  Filled 2020-02-25 (×10): qty 1

## 2020-02-25 MED ORDER — DIPHENHYDRAMINE HCL 25 MG PO CAPS
25.0000 mg | ORAL_CAPSULE | Freq: Four times a day (QID) | ORAL | Status: DC | PRN
  Administered 2020-02-25 (×2): 25 mg via ORAL
  Filled 2020-02-25 (×2): qty 1

## 2020-02-25 MED ORDER — FENTANYL CITRATE (PF) 100 MCG/2ML IJ SOLN
INTRAMUSCULAR | Status: AC
  Administered 2020-02-25: 50 ug
  Filled 2020-02-25: qty 2

## 2020-02-25 MED ORDER — HYDROMORPHONE HCL 1 MG/ML IJ SOLN
1.0000 mg | Freq: Once | INTRAMUSCULAR | Status: AC
  Administered 2020-02-25: 1 mg via INTRAVENOUS
  Filled 2020-02-25: qty 1

## 2020-02-25 MED ORDER — ACETAMINOPHEN 650 MG RE SUPP: 650.0000 mg | Freq: Four times a day (QID) | RECTAL | Status: DC | PRN

## 2020-02-25 MED ORDER — MORPHINE SULFATE (PF) 4 MG/ML IV SOLN
4.0000 mg | Freq: Once | INTRAVENOUS | Status: AC
  Administered 2020-02-25: 4 mg via INTRAVENOUS
  Filled 2020-02-25: qty 1

## 2020-02-25 MED ORDER — ACETAMINOPHEN 325 MG PO TABS
650.0000 mg | ORAL_TABLET | Freq: Four times a day (QID) | ORAL | Status: DC | PRN
  Administered 2020-02-25: 650 mg via ORAL
  Filled 2020-02-25: qty 2

## 2020-02-25 MED ORDER — HYDROMORPHONE HCL 1 MG/ML IJ SOLN
0.5000 mg | INTRAMUSCULAR | Status: DC | PRN
  Administered 2020-02-25 – 2020-02-26 (×6): 1 mg via INTRAVENOUS
  Filled 2020-02-25 (×7): qty 1

## 2020-02-25 NOTE — Consult Note (Signed)
Reason for Consult:  Left knee pain Referring Physician:  Dr. Randell Loop [name] is an 64 y.o. female.  HPI: The patient is a 64 year old female with a past medical history significant for hypertension and a previous left femur fracture treated surgically.  She fell yesterday at home while caring for her grandchildren.  She complains of acute pain in the left knee radiating to the left calf.  The pain is sharp and severe with any attempted motion.  She feels best when lying still.  She was unable to bear weight after the fall.  She was brought to the emergency room via EMS.  She was found to be hyponatremic and was admitted by the internal medicine teaching service.  She denies any history of injury or surgery to the knee.  She has had previous intramedullary nailing of the femur while residing in [facility].  She recently relocated to [place].  She denies any history of diabetes.  She is not a smoker.  She drinks alcohol regularly.    Past medical history/past surgical history: Hypertension, left femur fracture status post intramedullary nailing, bipolar disorder, morbid obesity   Family history: Negative for relevant diagnoses.  History of coronary artery disease in her father and cancer in her mother and brother   Social History: Drinks alcohol regularly.  No smoking for many years.  Denies drug use.  Allergies: No Known Allergies  Medications: I have reviewed the patient's current medications.  Results for orders placed or performed during the hospital encounter of 02/24/20 (from the past 48 hour(s))  Respiratory Panel by RT PCR (Flu A&B, Covid) - Nasopharyngeal Swab     Status: None   Collection Time: 02/25/20  4:25 AM   Specimen: Nasopharyngeal Swab  Result Value Ref Range   SARS Coronavirus 2 by RT PCR NEGATIVE NEGATIVE    Comment: (NOTE) SARS-CoV-2 target nucleic acids are NOT DETECTED.  The SARS-CoV-2 RNA is generally detectable in upper respiratoy specimens during the  acute phase of infection. The lowest concentration of SARS-CoV-2 viral copies this assay can detect is 131 copies/mL. A negative result does not preclude SARS-Cov-2 infection and should not be used as the sole basis for treatment or other patient management decisions. A negative result may occur with  improper specimen collection/handling, submission of specimen other than nasopharyngeal swab, presence of viral mutation(s) within the areas targeted by this assay, and inadequate number of viral copies (<131 copies/mL). A negative result must be combined with clinical observations, patient history, and epidemiological information. The expected result is Negative.  Fact Sheet for Patients:  PinkCheek.be  Fact Sheet for Healthcare Providers:  GravelBags.it  This test is no t yet approved or cleared by the [place] FDA and  has been authorized for detection and/or diagnosis of SARS-CoV-2 by FDA under an Emergency Use Authorization (EUA). This EUA will remain  in effect (meaning this test can be used) for the duration of the COVID-19 declaration under Section 564(b)(1) of the Act, 21 U.S.C. section 360bbb-3(b)(1), unless the authorization is terminated or revoked sooner.     Influenza A by PCR NEGATIVE NEGATIVE   Influenza B by PCR NEGATIVE NEGATIVE    Comment: (NOTE) The Xpert Xpress SARS-CoV-2/FLU/RSV assay is intended as an aid in  the diagnosis of influenza from Nasopharyngeal swab specimens and  should not be used as a sole basis for treatment. Nasal washings and  aspirates are unacceptable for Xpert Xpress SARS-CoV-2/FLU/RSV  testing.  Fact Sheet for Patients: PinkCheek.be  Fact  Sheet for Healthcare Providers: GravelBags.it  This test is not yet approved or cleared by the [place] and  has been authorized for detection and/or diagnosis of  SARS-CoV-2 by  FDA under an Emergency Use Authorization (EUA). This EUA will remain  in effect (meaning this test can be used) for the duration of the  Covid-19 declaration under Section 564(b)(1) of the Act, 21  U.S.C. section 360bbb-3(b)(1), unless the authorization is  terminated or revoked. Performed at [facility], [place] [address], [place], [place] [address]   CBC with Differential     Status: Abnormal   Collection Time: 02/25/20  4:25 AM  Result Value Ref Range   WBC 10.1 4.0 - 10.5 K/uL   RBC 4.13 3.87 - 5.11 MIL/uL   Hemoglobin 13.1 12.0 - 15.0 g/dL   HCT 37.2 36 - 46 %   MCV 90.1 80.0 - 100.0 fL   MCH 31.7 26.0 - 34.0 pg   MCHC 35.2 30.0 - 36.0 g/dL   RDW 12.4 11.5 - 15.5 %   Platelets 277 150 - 400 K/uL   nRBC 0.0 0.0 - 0.2 %   Neutrophils Relative % 81 %   Neutro Abs 8.2 (H) 1.7 - 7.7 K/uL   Lymphocytes Relative 10 %   Lymphs Abs 1.0 0.7 - 4.0 K/uL   Monocytes Relative 9 %   Monocytes Absolute 0.9 0.1 - 1.0 K/uL   Eosinophils Relative 0 %   Eosinophils Absolute 0.0 0.0 - 0.5 K/uL   Basophils Relative 0 %   Basophils Absolute 0.0 0.0 - 0.1 K/uL   Immature Granulocytes 0 %   Abs Immature Granulocytes 0.04 0.00 - 0.07 K/uL    Comment: Performed at [facility] [address], [place], [place] [address]  Basic metabolic panel     Status: Abnormal   Collection Time: 02/25/20  4:25 AM  Result Value Ref Range   Sodium 124 (L) 135 - 145 mmol/L   Potassium 3.5 3.5 - 5.1 mmol/L   Chloride 86 (L) 98 - 111 mmol/L   CO2 26 22 - 32 mmol/L   Glucose, Bld 123 (H) 70 - 99 mg/dL    Comment: Glucose reference range applies only to samples taken after fasting for at least 8 hours.   BUN 6 (L) 8 - 23 mg/dL   Creatinine, Ser 0.48 0.44 - 1.00 mg/dL   Calcium 8.4 (L) 8.9 - 10.3 mg/dL   GFR, Estimated >60 >60 mL/min    Comment: (NOTE) Calculated using the CKD-EPI Creatinine Equation (2021)    Anion gap 12 5 - 15    Comment: Performed at [facility] [address] [place], [place] [address]    CT Knee Left Wo Contrast  Result Date: 02/25/2020 CLINICAL DATA:  Tibial plateau fracture. EXAM: CT OF THE LEFT KNEE WITHOUT CONTRAST TECHNIQUE: Multidetector CT imaging of the left knee was performed according to the standard protocol. Multiplanar CT image reconstructions were also generated. COMPARISON:  Left knee radiographs, earlier today FINDINGS: Bones/Joint/Cartilage There is an acute, comminuted, intra-articular fracture involving the medial tibial plateau. There is mild depression of the anterior fracture fragments by approximately 5 mm. Signs of lateral collateral ligament avulsion injury is also noted with small osseous fragments noted adjacent to the lateral femoral condyle and lateral tibial plateau, image 46/6 and image 51/6. Additionally, nondisplaced fracture of the styloid process of the fibula is suspected which may be related to lateral collateral ligament injury and/or  arcuate ligament avulsion fracture., image 81/3. IM nail and screws are noted within the distal femoral diaphysis. Ligaments Suboptimally assessed by CT. Muscles and Tendons Unremarkable. Soft tissues Small suprapatellar joint effusion. Diffuse soft tissue stranding is identified within the subcutaneous fat surrounding the left knee. Small radio dense foreign body is noted within the subcutaneous soft tissues anterior to the proximal tibia measures 2 mm. IMPRESSION: 1. Acute, comminuted, intra-articular fracture involving the medial tibial plateau with mild depression of the anterior fracture fragments. 2. Signs of lateral collateral ligament avulsion injury is noted with small osseous fragments adjacent to the lateral femoral condyle and lateral tibial plateau. 3. Nondisplaced fracture of the styloid process of the fibula is suspected which may be related to lateral collateral ligament injury and/or arcuate ligament avulsion fracture. 4. Small suprapatellar joint effusion. 5. Small radio  dense foreign body is noted within the subcutaneous soft tissues anterior to the proximal tibia. Electronically Signed   By: Kerby Moors M.D.   On: 02/25/2020 05:26   DG Knee Complete 4 Views Left  Result Date: 02/25/2020 CLINICAL DATA:  Pain EXAM: LEFT KNEE - COMPLETE 4+ VIEW COMPARISON:  None. FINDINGS: There is an acute, depressed medial tibial plateau fracture. There is a small suprapatellar joint effusion. Mild tricompartmental degenerative changes are noted. There is soft tissue swelling about the knee. The patient has undergone prior intramedullary nail placement through the femur. The second most distal interlocking screw is bent. IMPRESSION: 1. Acute, depressed medial tibial plateau fracture. 2. Mild tricompartmental degenerative changes are noted. Electronically Signed   By: Constance Holster M.D.   On: 02/25/2020 03:02    ROS: No recent fever, chills, nausea, vomiting or changes in her appetite.  Recent visual difficulties were evaluated by an optometrist with no change in her vision identified.  10 system review was otherwise negative PE:  Blood pressure 111/66, pulse 78, temperature 97.7 F (36.5 C), temperature source Oral, resp. rate 18, SpO2 97 %. Well-nourished well-developed obese female in no apparent distress.  Alert and oriented x4.  Normal mood and affect.  Left lower extremity is immobilized in a knee immobilizer.  This was removed.  Skin is healthy and intact.  The knee has a moderate effusion.  Tender to palpation of the medial joint line.  Tender at the calf.  No significant pain with passive stretch at the ankle and toes.  No palpable pulses in either foot.  Dopplerable posterior tibial pulse bilaterally that is biphasic and equal.  Active plantar flexion dorsiflexion strength at the toes bilaterally.  No lymphadenopathy on either side.  Sensibility to light touch is intact in the medial and lateral plantar nerve distribution bilaterally.  Intact but subjectively decreased  in the superficial peroneal nerve distribution on the left.  Brisk capillary refill at the toes bilaterally.  Assessment/Plan: Left tibial plateau fracture with medial condylar depression anteriorly. -There is a small fleck of bone laterally suggestive of lateral collateral ligament injury.  At this point we will keep her immobilized in a knee immobilizer.  Nonweightbearing on the left lower extremity.  She may eat today and be n.p.o. after midnight.  She will need operative treatment of her displaced tibial plateau fracture.  Dr. Doreatha Martin is aware and will consult in the morning.  SCDs for DVT prophylaxis.  Hold blood thinners pending surgical treatment.  I explained the nature of this injury to the patient as well as the treatment plan in detail.  She understands the plan and agrees.

## 2020-02-25 NOTE — Progress Notes (Signed)
Dr Daryll Drown was sent secure amion message in regards to the patient has arrived to the unit.  Admission orders: notify MD when patient arrives.

## 2020-02-25 NOTE — ED Notes (Signed)
Admit MD at bedside

## 2020-02-25 NOTE — Progress Notes (Signed)
Orthopedic Tech Progress Note Patient Details:  [name] July 30, 1955 [id number]  Ortho Devices Type of Ortho Device: Knee Immobilizer Ortho Device/Splint Location: lle. RN assisted. Ortho Device/Splint Interventions: Ordered, Application, Adjustment   Post Interventions Patient Tolerated: Well Instructions Provided: Care of device, Adjustment of device   Karolee Stamps 02/25/2020, 6:50 AM

## 2020-02-25 NOTE — ED Notes (Signed)
Pt cleaned of urinary incontinence.

## 2020-02-25 NOTE — ED Provider Notes (Signed)
[facility]GENC[facility] DEPARTMENT Provider Note   CSN: [id number] Arrival date & time: 02/24/20  2256     History Chief Complaint  Patient presents with  . Leg Injury    [name] is a 64 y.o. female.  The history is provided by the patient and medical records.    64 y.o. F here after fall that occurred at home.  She has gait imbalance at baseline due to prior left femur fracture resulting in shortening of the leg.  She does use walker at baseline but was not using it tonight as she had grandchildren and pets in the home.  States she stepped down and felt a lot of "crunching and popping" in left knee and then fell to the floor.  No head injury or LOC.  States she has not been able to stand up or bear weight since that time.  She [facility] seen [facility], Dr. Alvan Dame, for her left femur issues and would like to stay with that practice.  No past medical history on file.  There are no problems to display for this patient.    OB History   No obstetric history on file.     No family history on file.  Social History   Tobacco Use  . Smoking status: Not on file  Substance Use Topics  . Alcohol use: Not on file  . Drug use: Not on file    Home Medications Prior to Admission medications   Not on File    Allergies    Patient has no allergy information on record.  Review of Systems   Review of Systems  Musculoskeletal: Positive for arthralgias.  All other systems reviewed and are negative.   Physical Exam Updated Vital Signs BP 133/60 (BP Location: Left Arm)   Pulse 76   Temp 97.8 F (36.6 C) (Oral)   Resp 18   SpO2 98%   Physical Exam Vitals and nursing note reviewed.  Constitutional:      Appearance: She is well-developed.     Comments: Morbidly obese  HENT:     Head: Normocephalic and atraumatic.  Eyes:     Conjunctiva/sclera: Conjunctivae normal.     Pupils: Pupils are equal, round, and reactive to light.   Cardiovascular:     Rate and Rhythm: Normal rate and regular rhythm.     Heart sounds: Normal heart sounds.  Pulmonary:     Effort: Pulmonary effort is normal.     Breath sounds: Normal breath sounds.  Abdominal:     General: Bowel sounds are normal.     Palpations: Abdomen is soft.  Musculoskeletal:        General: Normal range of motion.     Cervical back: Normal range of motion.     Comments: Knee is diffusely tender to palpation but worse over anterior aspect including patella and into proximal calf, there is swelling noted, wiggling toes as normal, DP pulse intact, foot is warm and well perfused  Skin:    General: Skin is warm and dry.  Neurological:     Mental Status: She is alert and oriented to person, place, and time.     ED Results / Procedures / Treatments   Labs (all labs ordered are listed, but only abnormal results are displayed) Labs Reviewed  CBC WITH DIFFERENTIAL/PLATELET - Abnormal; Notable for the following components:      Result Value   Neutro Abs 8.2 (*)    All other components  within normal limits  BASIC METABOLIC PANEL - Abnormal; Notable for the following components:   Sodium 124 (*)    Chloride 86 (*)    Glucose, Bld 123 (*)    BUN 6 (*)    Calcium 8.4 (*)    All other components within normal limits  RESPIRATORY PANEL BY RT PCR (FLU A&B, COVID)    EKG None  Radiology CT Knee Left Wo Contrast  Result Date: 02/25/2020 CLINICAL DATA:  Tibial plateau fracture. EXAM: CT OF THE LEFT KNEE WITHOUT CONTRAST TECHNIQUE: Multidetector CT imaging of the left knee was performed according to the standard protocol. Multiplanar CT image reconstructions were also generated. COMPARISON:  Left knee radiographs, earlier today FINDINGS: Bones/Joint/Cartilage There is an acute, comminuted, intra-articular fracture involving the medial tibial plateau. There is mild depression of the anterior fracture fragments by approximately 5 mm. Signs of lateral collateral  ligament avulsion injury is also noted with small osseous fragments noted adjacent to the lateral femoral condyle and lateral tibial plateau, image 46/6 and image 51/6. Additionally, nondisplaced fracture of the styloid process of the fibula is suspected which may be related to lateral collateral ligament injury and/or arcuate ligament avulsion fracture., image 81/3. IM nail and screws are noted within the distal femoral diaphysis. Ligaments Suboptimally assessed by CT. Muscles and Tendons Unremarkable. Soft tissues Small suprapatellar joint effusion. Diffuse soft tissue stranding is identified within the subcutaneous fat surrounding the left knee. Small radio dense foreign body is noted within the subcutaneous soft tissues anterior to the proximal tibia measures 2 mm. IMPRESSION: 1. Acute, comminuted, intra-articular fracture involving the medial tibial plateau with mild depression of the anterior fracture fragments. 2. Signs of lateral collateral ligament avulsion injury is noted with small osseous fragments adjacent to the lateral femoral condyle and lateral tibial plateau. 3. Nondisplaced fracture of the styloid process of the fibula is suspected which may be related to lateral collateral ligament injury and/or arcuate ligament avulsion fracture. 4. Small suprapatellar joint effusion. 5. Small radio dense foreign body is noted within the subcutaneous soft tissues anterior to the proximal tibia. Electronically Signed   By: Kerby Moors M.D.   On: 02/25/2020 05:26   DG Knee Complete 4 Views Left  Result Date: 02/25/2020 CLINICAL DATA:  Pain EXAM: LEFT KNEE - COMPLETE 4+ VIEW COMPARISON:  None. FINDINGS: There is an acute, depressed medial tibial plateau fracture. There is a small suprapatellar joint effusion. Mild tricompartmental degenerative changes are noted. There is soft tissue swelling about the knee. The patient has undergone prior intramedullary nail placement through the femur. The second most  distal interlocking screw is bent. IMPRESSION: 1. Acute, depressed medial tibial plateau fracture. 2. Mild tricompartmental degenerative changes are noted. Electronically Signed   By: Constance Holster M.D.   On: 02/25/2020 03:02    Procedures Procedures (including critical care time)  Medications Ordered in ED Medications  HYDROmorphone (DILAUDID) injection 1 mg (has no administration in time range)  fentaNYL (SUBLIMAZE) 100 MCG/2ML injection (50 mcg  Given 02/25/20 0208)  morphine 4 MG/ML injection 4 mg (4 mg Intravenous Given 02/25/20 0417)    ED Course  I have reviewed the triage vital signs and the nursing notes.  Pertinent labs & imaging results that were available during my care of the patient were reviewed by me and considered in my medical decision making (see chart for details).    MDM Rules/Calculators/A&P  64 y.o. F here after a fall at home.  States she felt some crunching and  popping.  She has baseline gait instability and uses walker but was not using it tonight at time of fall.  No head injury or LOC.  She is AAOx3, no focal deficits.  Does have diffuse tenderness of left knee and swelling extending into proximal calf.  Leg is NVI, foot warm and well perfused.  X-ray with depressed medial tibial plateau fracture.  Unfortunately, patient will not be able to ambulate given her already unsteady gait and obese body habitus.    Discussed with on call orthopedics for [facility], Dr. Tonita Cong-- will get CT knee, place in immobilizer, and admit for observation.  I have placed temporary admission orders until seen in AM.  CT has been performed, on review injuries appear much more extensive than on original screening x-ray.  See formal read as above.  Final Clinical Impression(s) / ED Diagnoses Final diagnoses:  Closed fracture of medial portion of left tibial plateau, initial encounter    Rx / DC Orders ED Discharge Orders    None       Larene Pickett, PA-C 02/25/20 0530     Lucrezia Starch, MD 02/25/20 0602    Lucrezia Starch, MD 02/25/20 [phone]

## 2020-02-25 NOTE — Plan of Care (Signed)

## 2020-02-25 NOTE — Consult Note (Signed)
Reason for Consult: Left tibia fracture Referring Physician: EDP  [name] is an 64 y.o. female.  HPI: Stepped down felt a pop in knee and fell. History of IM rod left femur with shortening.   No past medical history on file.   No family history on file.  Social History:  has no history on file for tobacco use, alcohol use, and drug use.  Allergies: No Known Allergies  Medications: I have reviewed the patient's current medications.  Results for orders placed or performed during the hospital encounter of 02/24/20 (from the past 48 hour(s))  Respiratory Panel by RT PCR (Flu A&B, Covid) - Nasopharyngeal Swab     Status: None   Collection Time: 02/25/20  4:25 AM   Specimen: Nasopharyngeal Swab  Result Value Ref Range   SARS Coronavirus 2 by RT PCR NEGATIVE NEGATIVE    Comment: (NOTE) SARS-CoV-2 target nucleic acids are NOT DETECTED.  The SARS-CoV-2 RNA is generally detectable in upper respiratoy specimens during the acute phase of infection. The lowest concentration of SARS-CoV-2 viral copies this assay can detect is 131 copies/mL. A negative result does not preclude SARS-Cov-2 infection and should not be used as the sole basis for treatment or other patient management decisions. A negative result may occur with  improper specimen collection/handling, submission of specimen other than nasopharyngeal swab, presence of viral mutation(s) within the areas targeted by this assay, and inadequate number of viral copies (<131 copies/mL). A negative result must be combined with clinical observations, patient history, and epidemiological information. The expected result is Negative.  Fact Sheet for Patients:  PinkCheek.be  Fact Sheet for Healthcare Providers:  GravelBags.it  This test is no t yet approved or cleared by the [place] FDA and  has been authorized for detection and/or diagnosis of SARS-CoV-2 by FDA under  an Emergency Use Authorization (EUA). This EUA will remain  in effect (meaning this test can be used) for the duration of the COVID-19 declaration under Section 564(b)(1) of the Act, 21 U.S.C. section 360bbb-3(b)(1), unless the authorization is terminated or revoked sooner.     Influenza A by PCR NEGATIVE NEGATIVE   Influenza B by PCR NEGATIVE NEGATIVE    Comment: (NOTE) The Xpert Xpress SARS-CoV-2/FLU/RSV assay is intended as an aid in  the diagnosis of influenza from Nasopharyngeal swab specimens and  should not be used as a sole basis for treatment. Nasal washings and  aspirates are unacceptable for Xpert Xpress SARS-CoV-2/FLU/RSV  testing.  Fact Sheet for Patients: PinkCheek.be  Fact Sheet for Healthcare Providers: GravelBags.it  This test is not yet approved or cleared by the [place] FDA and  has been authorized for detection and/or diagnosis of SARS-CoV-2 by  FDA under an Emergency Use Authorization (EUA). This EUA will remain  in effect (meaning this test can be used) for the duration of the  Covid-19 declaration under Section 564(b)(1) of the Act, 21  U.S.C. section 360bbb-3(b)(1), unless the authorization is  terminated or revoked. Performed at [facility], [place] [address], [place], [place] [address]   CBC with Differential     Status: Abnormal   Collection Time: 02/25/20  4:25 AM  Result Value Ref Range   WBC 10.1 4.0 - 10.5 K/uL   RBC 4.13 3.87 - 5.11 MIL/uL   Hemoglobin 13.1 12.0 - 15.0 g/dL   HCT 37.2 36 - 46 %   MCV 90.1 80.0 - 100.0 fL   MCH 31.7 26.0 - 34.0 pg   MCHC 35.2  30.0 - 36.0 g/dL   RDW 12.4 11.5 - 15.5 %   Platelets 277 150 - 400 K/uL   nRBC 0.0 0.0 - 0.2 %   Neutrophils Relative % 81 %   Neutro Abs 8.2 (H) 1.7 - 7.7 K/uL   Lymphocytes Relative 10 %   Lymphs Abs 1.0 0.7 - 4.0 K/uL   Monocytes Relative 9 %   Monocytes Absolute 0.9 0.1 - 1.0 K/uL   Eosinophils Relative 0 %    Eosinophils Absolute 0.0 0.0 - 0.5 K/uL   Basophils Relative 0 %   Basophils Absolute 0.0 0.0 - 0.1 K/uL   Immature Granulocytes 0 %   Abs Immature Granulocytes 0.04 0.00 - 0.07 K/uL    Comment: Performed at [facility] [address] [place], [place] [address]  Basic metabolic panel     Status: Abnormal   Collection Time: 02/25/20  4:25 AM  Result Value Ref Range   Sodium 124 (L) 135 - 145 mmol/L   Potassium 3.5 3.5 - 5.1 mmol/L   Chloride 86 (L) 98 - 111 mmol/L   CO2 26 22 - 32 mmol/L   Glucose, Bld 123 (H) 70 - 99 mg/dL    Comment: Glucose reference range applies only to samples taken after fasting for at least 8 hours.   BUN 6 (L) 8 - 23 mg/dL   Creatinine, Ser 0.48 0.44 - 1.00 mg/dL   Calcium 8.4 (L) 8.9 - 10.3 mg/dL   GFR, Estimated >60 >60 mL/min    Comment: (NOTE) Calculated using the CKD-EPI Creatinine Equation (2021)    Anion gap 12 5 - 15    Comment: Performed at [facility] [address], [place], [place] [address]    CT Knee Left Wo Contrast  Result Date: 02/25/2020 CLINICAL DATA:  Tibial plateau fracture. EXAM: CT OF THE LEFT KNEE WITHOUT CONTRAST TECHNIQUE: Multidetector CT imaging of the left knee was performed according to the standard protocol. Multiplanar CT image reconstructions were also generated. COMPARISON:  Left knee radiographs, earlier today FINDINGS: Bones/Joint/Cartilage There is an acute, comminuted, intra-articular fracture involving the medial tibial plateau. There is mild depression of the anterior fracture fragments by approximately 5 mm. Signs of lateral collateral ligament avulsion injury is also noted with small osseous fragments noted adjacent to the lateral femoral condyle and lateral tibial plateau, image 46/6 and image 51/6. Additionally, nondisplaced fracture of the styloid process of the fibula is suspected which may be related to lateral collateral ligament injury and/or arcuate ligament avulsion fracture., image 81/3. IM nail  and screws are noted within the distal femoral diaphysis. Ligaments Suboptimally assessed by CT. Muscles and Tendons Unremarkable. Soft tissues Small suprapatellar joint effusion. Diffuse soft tissue stranding is identified within the subcutaneous fat surrounding the left knee. Small radio dense foreign body is noted within the subcutaneous soft tissues anterior to the proximal tibia measures 2 mm. IMPRESSION: 1. Acute, comminuted, intra-articular fracture involving the medial tibial plateau with mild depression of the anterior fracture fragments. 2. Signs of lateral collateral ligament avulsion injury is noted with small osseous fragments adjacent to the lateral femoral condyle and lateral tibial plateau. 3. Nondisplaced fracture of the styloid process of the fibula is suspected which may be related to lateral collateral ligament injury and/or arcuate ligament avulsion fracture. 4. Small suprapatellar joint effusion. 5. Small radio dense foreign body is noted within the subcutaneous soft tissues anterior to the proximal tibia. Electronically Signed   By: Queen Slough.D.  On: 02/25/2020 05:26   DG Knee Complete 4 Views Left  Result Date: 02/25/2020 CLINICAL DATA:  Pain EXAM: LEFT KNEE - COMPLETE 4+ VIEW COMPARISON:  None. FINDINGS: There is an acute, depressed medial tibial plateau fracture. There is a small suprapatellar joint effusion. Mild tricompartmental degenerative changes are noted. There is soft tissue swelling about the knee. The patient has undergone prior intramedullary nail placement through the femur. The second most distal interlocking screw is bent. IMPRESSION: 1. Acute, depressed medial tibial plateau fracture. 2. Mild tricompartmental degenerative changes are noted. Electronically Signed   By: Constance Holster M.D.   On: 02/25/2020 03:02    Review of Systems  Musculoskeletal: Positive for joint pain.   Blood pressure 133/61, pulse 79, temperature 98 F (36.7 C), temperature source  Oral, resp. rate 18, SpO2 98 %. Physical Exam  Per Dr. Doran Durand  Assessment/Plan:  Comminuted left medial tibial plateau fracture with LCL avulsion. S/P IM nail left femur with shortening Hyponatremia. Obesity  Plan to admit to HCP with subsequent possible ORIF Of tibial plateau by  Dr. Doreatha [place]. He will se patient tomorrow Knee immobilizer. Ice and elevation. NWB. NV checks TEDS.   Johnn Hai 02/25/2020, 7:11 AM

## 2020-02-25 NOTE — Progress Notes (Signed)
Nurse spoke with lab in regards to the every 6 hour lab draw- The lab person reports that they are currently understaffed and will obtain as soon as possible.

## 2020-02-25 NOTE — ED Provider Notes (Signed)
Internal Medicine Teaching team is aware of case and will evaluate for admission.    Valarie Merino, MD 02/25/20 [phone]

## 2020-02-25 NOTE — Progress Notes (Signed)
EKG completed.  MD present at the bedside.

## 2020-02-25 NOTE — H&P (Addendum)
NAME:  [name], MRN:  [id number], DOB:  [birth year], LOS: 0 ADMISSION DATE:  02/24/2020, Primary: [name], MD  CHIEF COMPLAINT:  Leg pain  Medical Service: Internal Medicine Teaching Service         Attending Physician: Dr. Sid Falcon, MD    First Contact: Dr. Darrick Meigs Pager: [phone]  Second Contact: Dr. Wynetta Emery Pager: [phone]       After Hours (After 5p/  First Contact Pager: [phone]  weekends / holidays): Second Contact Pager: [phone]    History of present illness   64 year old female with past medical history of morbid obesity, bipolar disorder, hypertension presented to [facility] emergency department via EMS on 02/24/2020 for evaluation of left leg pain. Patient notes that she was taking care of her grandchildren yesterday who were staying the night and Tylenol for some air mattresses around 2000 last evening.  She notes that she heard crunching and popping prior to hitting the floor. She does note having drank 3 beers throughout the day yesterday.  Additional note, she femur fracture 2 years ago that required surgical fixation.  She denies use of PPIs, or ibuprofen.  No prior DEXA scan.  She has never been on bisphosphonates.  Also of note, she states that she recently was started on hydrochlorothiazide 25 mg daily on October 28 while establishing with a PCP at [facility].  Serum sodium at that time was 134 and she was instructed to follow-up sometime in early November for recheck.  Also of note, she endorses visual issues over the last 3 weeks or so making it difficult for her to see her laptop.  She describes this as the patient reported.  She did actually go to an optometrist at which time it was found that her vision had changed very little.  She has not seen any physician for the past 2 years prior to October 20 due to not having insurance.  ED course  Imaging in the ED revealed a left tibial plateau fracture involving the joint, left fibular fracture, and  avulsion injury of the LCL. Labs revealed a serum sodium of 124. Orthopedics was called by EDP who asked to admit to medicine for management of hyponatremia and noted that they will plan to take her to the OR later today.   Past Medical History  morbid obesity, bipolar disorder, hypertension  Home Medications     Prior to Admission medications   Medication Sig Start Date End Date Taking? Authorizing Provider  diclofenac (VOLTAREN) 75 MG EC tablet Take 75 mg by mouth 2 (two) times daily.   Yes [provider]  hydrochlorothiazide (HYDRODIURIL) 25 MG tablet Take 25 mg by mouth daily. 02/07/20  Yes [provider]    Allergies    None  Social History  Resides in [place], [place] with her husband.  Recently moved from [facility] couple years ago. Prior smoker.  Drinks about a sixpack of beer 3-4 times a week.  Denies any recreational drug use.  Family History   Mother passed away from colon cancer Father passed away from an MI One brother currently has colon cancer. ROS  Review of Systems  Constitutional: Negative for chills and fever.  Eyes: Positive for blurred vision.  Respiratory: Negative for cough and shortness of breath.   Cardiovascular: Negative for chest pain.  Gastrointestinal: Negative for abdominal pain.  Genitourinary: Negative.   Musculoskeletal: Positive for joint pain.  Neurological: Negative for dizziness, focal weakness, loss of consciousness, weakness and headaches.  Psychiatric/Behavioral: Negative.    Objective   Blood pressure 111/66, pulse 78, temperature 97.7 F (36.5 C), temperature source Oral, resp. rate 18, SpO2 97 %.    Examination: GENERAL: in no acute distress HEENT: atraumatic CV: heart RRR. +1 pitting edema to lower extremities. Right anterior pedal pulse appreciable on doppler. Left anterior pedal pulse not appreciable via doppler. Cap refill 1-2 seconds on the left. PULMONARY: breathing comfortably on  room air. Lungs clear. ABDOMEN: soft. Nontender to palpation.  Nondistended.  NEURO: diminished sensation to the dorsum of the left foot. Plantar and lateral sensation intact. SKIN: no rash or lesions on limited exam  MSK: splint present on left lower extremity. Able to move left toes.   Consults:  ortho  Significant Diagnostic Tests:  CT left knee>>acute, comminuted, intra-articular fracture involving the medial tibial plateau with mild depression of the anterior fracture fragments. Signs of lateral collateral ligament avulsion injurywith small osseous fragments adjacent to the lateral femoral condyle and lateral tibial plateau. Nondisplaced fracture of the styloid process of the fibula is suspected which may be related to lateral collateral ligament injury and or arcuate ligament avulsion fracture. Small suprapatellar joint effusion. Small radio dense foreign body noted within the subcutaneous soft tissues anterior the proximal tibia.   Labs    CBC Latest Ref Rng & Units 02/25/2020 04/18/2008 04/17/2008  WBC 4.0 - 10.5 K/uL 10.1 8.5 6.3  Hemoglobin 12.0 - 15.0 g/dL 13.1 11.8(L) 13.9  Hematocrit 36 - 46 % 37.2 34.5(L) 41.3  Platelets 150 - 400 K/uL 277 171 222   BMP Latest Ref Rng & Units 02/25/2020 04/17/2008 01/17/2008  Glucose 70 - 99 mg/dL 123(H) 89 116(H)  BUN 8 - 23 mg/dL 6(L) 10 8  Creatinine 0.44 - 1.00 mg/dL 0.48 0.54 0.63  Sodium 135 - 145 mmol/L 124(L) 132(L) 137  Potassium 3.5 - 5.1 mmol/L 3.5 3.6 3.9  Chloride 98 - 111 mmol/L 86(L) 99 105  CO2 22 - 32 mmol/L 26 26 26   Calcium 8.9 - 10.3 mg/dL 8.4(L) 8.4 9.1     Summary  64 yo female who presented to the ED after a fall resulting in a left tibial plateau fracture along with fibula fx and ligamentuous injury. Orthopedics has been called and has planned to operate later today. She is being admitted to the medicine service for management of incidentally noted hyponatremia noted on labs.  Assessment & Plan:  Active  Problems:   Tibial plateau fracture   Tibial plateau fracture, left  Left Tibial plateau fracture Left nondisplaced fibula fracture LCL, ?arcuate ligament injury History of femur fracture--fragility fracture. No prior DEXA, no long term use of medications that would increase her fracture risk. Alcohol use and prior tobacco use are present however. Relayed physical exam findings of absent anterior tibial pulse on the left to Dr. Doran Durand with orthopedics who will come to evaluate. Presence of good cap refill time is reassuring. Plan:  --management per orthopedics.  --Will also obtain a vitamin D level.  --Pain management with dilaudid 0.5-1mg  q2h --NPO  Subacute Hyponatremia (124 on admission). Likely in the setting of starting hctz 25mg  on October 20th. Sodium 137 at that time. I think the hctz compounded by her alcohol use, is the most likely source.  Considered obtaining serum osmol, urine sodium, however are likely to be affected by hctz. She has also noted blurry vision over the past three weeks, which may be related to the hyponatremia. Plan --start NS 128mL. She denied any other hyponatremic symptoms  and the blurry vision vision is questionable, so will hold off on giving 3% saline given the higher risks associated with this --Serum sodium goal: increase 4-49mmol/L per day as I do not think this is acute, more so subacute --BMP every 8 hours   Alcohol use disorder. Drinks a 6-pack of beer three to four times a week.  -CIWA  Hypertension. Discontinue hctz. Will need to follow up with PCP to discuss alternatives. Best practice:  CODE STATUS: Full Diet: NPO Pain management: dilaudid 0.5-1mg  q2h prn DVT for prophylaxis: SCDs Dispo: Admit patient to Inpatient with expected length of stay greater than 2 midnights.   Mitzi Hansen, MD Internal Medicine Resident PGY-2 [facility] Pager: [phone] 02/25/2020 8:38 AM

## 2020-02-25 NOTE — Progress Notes (Signed)
Patient has arrived to unit- Dr Tonita Cong called for update on the patient.

## 2020-02-26 ENCOUNTER — Encounter (HOSPITAL_COMMUNITY): Payer: Self-pay | Admitting: Internal Medicine

## 2020-02-26 ENCOUNTER — Inpatient Hospital Stay (HOSPITAL_COMMUNITY): Payer: BC Managed Care – PPO | Admitting: Certified Registered"

## 2020-02-26 ENCOUNTER — Inpatient Hospital Stay (HOSPITAL_COMMUNITY): Payer: BC Managed Care – PPO

## 2020-02-26 ENCOUNTER — Other Ambulatory Visit: Payer: Self-pay

## 2020-02-26 ENCOUNTER — Encounter (HOSPITAL_COMMUNITY)
Admission: EM | Disposition: A | Discharge status: Rehabilitation Facility | Payer: Self-pay | Source: Home / Self Care | Attending: Internal Medicine

## 2020-02-26 DIAGNOSIS — W19XXXA Unspecified fall, initial encounter: Secondary | ICD-10-CM

## 2020-02-26 HISTORY — PX: ORIF TIBIA PLATEAU: SHX2132

## 2020-02-26 LAB — BASIC METABOLIC PANEL
Anion gap: 11 (ref 5–15)
Anion gap: 7 (ref 5–15)
Anion gap: 9 (ref 5–15)
BUN: 5 mg/dL — ABNORMAL LOW (ref 8–23)
BUN: 5 mg/dL — ABNORMAL LOW (ref 8–23)
BUN: 7 mg/dL — ABNORMAL LOW (ref 8–23)
CO2: 28 mmol/L (ref 22–32)
CO2: 29 mmol/L (ref 22–32)
CO2: 30 mmol/L (ref 22–32)
Calcium: 8.2 mg/dL — ABNORMAL LOW (ref 8.9–10.3)
Calcium: 8.3 mg/dL — ABNORMAL LOW (ref 8.9–10.3)
Calcium: 8.4 mg/dL — ABNORMAL LOW (ref 8.9–10.3)
Chloride: 92 mmol/L — ABNORMAL LOW (ref 98–111)
Chloride: 94 mmol/L — ABNORMAL LOW (ref 98–111)
Chloride: 96 mmol/L — ABNORMAL LOW (ref 98–111)
Creatinine, Ser: 0.49 mg/dL (ref 0.44–1.00)
Creatinine, Ser: 0.55 mg/dL (ref 0.44–1.00)
Creatinine, Ser: 0.7 mg/dL (ref 0.44–1.00)
GFR, Estimated: >60 mL/min (ref >60)
GFR, Estimated: >60 mL/min (ref >60)
GFR, Estimated: >60 mL/min (ref >60)
Glucose, Bld: 113 mg/dL — ABNORMAL HIGH (ref 70–99)
Glucose, Bld: 125 mg/dL — ABNORMAL HIGH (ref 70–99)
Glucose, Bld: 152 mg/dL — ABNORMAL HIGH (ref 70–99)
Potassium: 3.7 mmol/L (ref 3.5–5.1)
Potassium: 3.7 mmol/L (ref 3.5–5.1)
Potassium: 4.2 mmol/L (ref 3.5–5.1)
Sodium: 130 mmol/L — ABNORMAL LOW (ref 135–145)
Sodium: 133 mmol/L — ABNORMAL LOW (ref 135–145)
Sodium: 133 mmol/L — ABNORMAL LOW (ref 135–145)

## 2020-02-26 LAB — CBC
HCT: 36.2 % (ref 36.0–46.0)
Hemoglobin: 11.8 g/dL — ABNORMAL LOW (ref 12.0–15.0)
MCH: 30.4 pg (ref 26.0–34.0)
MCHC: 32.6 g/dL (ref 30.0–36.0)
MCV: 93.3 fL (ref 80.0–100.0)
Platelets: 270 10*3/uL (ref 150–400)
RBC: 3.88 MIL/uL (ref 3.87–5.11)
RDW: 13.2 % (ref 11.5–15.5)
WBC: 7.3 10*3/uL (ref 4.0–10.5)
nRBC: 0 % (ref 0.0–0.2)

## 2020-02-26 LAB — MRSA PCR SCREENING: MRSA by PCR: NEGATIVE

## 2020-02-26 SURGERY — OPEN REDUCTION INTERNAL FIXATION (ORIF) TIBIAL PLATEAU
Anesthesia: General | Site: Leg Lower | Laterality: Left

## 2020-02-26 MED ORDER — PROPOFOL 10 MG/ML IV BOLUS
INTRAVENOUS | Status: AC
  Filled 2020-02-26: qty 20

## 2020-02-26 MED ORDER — ENOXAPARIN SODIUM 40 MG/0.4ML ~~LOC~~ SOLN
40.0000 mg | SUBCUTANEOUS | Status: DC
  Administered 2020-02-27 – 2020-03-01 (×4): 40 mg via SUBCUTANEOUS
  Filled 2020-02-26 (×4): qty 0.4

## 2020-02-26 MED ORDER — CEFAZOLIN SODIUM-DEXTROSE 2-4 GM/100ML-% IV SOLN: 2.0000 g | Freq: Three times a day (TID) | INTRAVENOUS | Status: DC

## 2020-02-26 MED ORDER — HYDROMORPHONE HCL 1 MG/ML IJ SOLN
INTRAMUSCULAR | Status: AC
  Filled 2020-02-26: qty 0.5

## 2020-02-26 MED ORDER — HYDROMORPHONE HCL 1 MG/ML IJ SOLN
0.5000 mg | INTRAMUSCULAR | Status: DC | PRN
  Administered 2020-02-26 – 2020-02-29 (×6): 1 mg via INTRAVENOUS
  Filled 2020-02-26 (×6): qty 1

## 2020-02-26 MED ORDER — FENTANYL CITRATE (PF) 250 MCG/5ML IJ SOLN
INTRAMUSCULAR | Status: AC
  Filled 2020-02-26: qty 5

## 2020-02-26 MED ORDER — DEXTROSE 5 % IV SOLN
3.0000 g | INTRAVENOUS | Status: DC
  Administered 2020-02-26: 3 g via INTRAVENOUS
  Filled 2020-02-26: qty 3000

## 2020-02-26 MED ORDER — MEPERIDINE HCL 25 MG/ML IJ SOLN
6.2500 mg | INTRAMUSCULAR | Status: DC | PRN
  Administered 2020-02-26 (×2): 12.5 mg via INTRAVENOUS

## 2020-02-26 MED ORDER — LACTATED RINGERS IV SOLN: INTRAVENOUS | Status: DC

## 2020-02-26 MED ORDER — MELATONIN 5 MG PO TABS
5.0000 mg | ORAL_TABLET | Freq: Once | ORAL | Status: AC
  Administered 2020-02-26: 5 mg via ORAL
  Filled 2020-02-26: qty 1

## 2020-02-26 MED ORDER — LIDOCAINE 2% (20 MG/ML) 5 ML SYRINGE
INTRAMUSCULAR | Status: DC | PRN
  Administered 2020-02-26: 40 mg via INTRAVENOUS

## 2020-02-26 MED ORDER — ONDANSETRON HCL 4 MG/2ML IJ SOLN
INTRAMUSCULAR | Status: DC | PRN
  Administered 2020-02-26: 4 mg via INTRAVENOUS

## 2020-02-26 MED ORDER — CEFAZOLIN SODIUM-DEXTROSE 2-4 GM/100ML-% IV SOLN
2.0000 g | Freq: Three times a day (TID) | INTRAVENOUS | Status: AC
  Administered 2020-02-26 – 2020-02-27 (×3): 2 g via INTRAVENOUS
  Filled 2020-02-26 (×3): qty 100

## 2020-02-26 MED ORDER — VANCOMYCIN HCL 1000 MG IV SOLR
INTRAVENOUS | Status: DC | PRN
  Administered 2020-02-26: 1000 mg

## 2020-02-26 MED ORDER — DEXAMETHASONE SODIUM PHOSPHATE 10 MG/ML IJ SOLN
INTRAMUSCULAR | Status: DC | PRN
  Administered 2020-02-26: 5 mg via INTRAVENOUS

## 2020-02-26 MED ORDER — HYDROMORPHONE HCL 1 MG/ML IJ SOLN
0.2500 mg | INTRAMUSCULAR | Status: DC | PRN
  Administered 2020-02-26 (×2): 0.5 mg via INTRAVENOUS

## 2020-02-26 MED ORDER — METHOCARBAMOL 500 MG PO TABS
500.0000 mg | ORAL_TABLET | Freq: Four times a day (QID) | ORAL | Status: DC | PRN
  Administered 2020-02-26 – 2020-02-29 (×7): 500 mg via ORAL
  Filled 2020-02-26 (×7): qty 1

## 2020-02-26 MED ORDER — ACETAMINOPHEN 10 MG/ML IV SOLN: 1000.0000 mg | Freq: Once | INTRAVENOUS | Status: DC | PRN

## 2020-02-26 MED ORDER — VANCOMYCIN HCL 1000 MG IV SOLR
INTRAVENOUS | Status: AC
  Filled 2020-02-26: qty 1000

## 2020-02-26 MED ORDER — MEPERIDINE HCL 25 MG/ML IJ SOLN
INTRAMUSCULAR | Status: AC
  Filled 2020-02-26: qty 1

## 2020-02-26 MED ORDER — FENTANYL CITRATE (PF) 250 MCG/5ML IJ SOLN
INTRAMUSCULAR | Status: DC | PRN
  Administered 2020-02-26 (×3): 50 ug via INTRAVENOUS
  Administered 2020-02-26: 100 ug via INTRAVENOUS

## 2020-02-26 MED ORDER — HYDROMORPHONE HCL 1 MG/ML IJ SOLN
INTRAMUSCULAR | Status: AC
  Filled 2020-02-26: qty 1

## 2020-02-26 MED ORDER — SUGAMMADEX SODIUM 200 MG/2ML IV SOLN
INTRAVENOUS | Status: DC | PRN
  Administered 2020-02-26: 300 mg via INTRAVENOUS

## 2020-02-26 MED ORDER — MIDAZOLAM HCL 5 MG/5ML IJ SOLN
INTRAMUSCULAR | Status: DC | PRN
  Administered 2020-02-26: 2 mg via INTRAVENOUS

## 2020-02-26 MED ORDER — ACETAMINOPHEN 160 MG/5ML PO SOLN: 325.0000 mg | Freq: Once | ORAL | Status: DC | PRN

## 2020-02-26 MED ORDER — LABETALOL HCL 5 MG/ML IV SOLN
INTRAVENOUS | Status: DC | PRN
  Administered 2020-02-26: 10 mg via INTRAVENOUS

## 2020-02-26 MED ORDER — BACITRACIN ZINC 500 UNIT/GM EX OINT
TOPICAL_OINTMENT | CUTANEOUS | Status: AC
  Filled 2020-02-26: qty 28.35

## 2020-02-26 MED ORDER — AMISULPRIDE (ANTIEMETIC) 5 MG/2ML IV SOLN: 10.0000 mg | Freq: Once | INTRAVENOUS | Status: DC | PRN

## 2020-02-26 MED ORDER — HYDROMORPHONE HCL 1 MG/ML IJ SOLN
2.0000 mg | Freq: Once | INTRAMUSCULAR | Status: AC
  Administered 2020-02-26: 2 mg via INTRAVENOUS
  Filled 2020-02-26: qty 2

## 2020-02-26 MED ORDER — TOBRAMYCIN SULFATE 1.2 G IJ SOLR
INTRAMUSCULAR | Status: DC | PRN
  Administered 2020-02-26: 1.2 g

## 2020-02-26 MED ORDER — DEXTROSE 5 % IV SOLN
INTRAVENOUS | Status: DC | PRN
  Administered 2020-02-26: 3 g via INTRAVENOUS

## 2020-02-26 MED ORDER — ROCURONIUM BROMIDE 10 MG/ML (PF) SYRINGE
PREFILLED_SYRINGE | INTRAVENOUS | Status: DC | PRN
  Administered 2020-02-26: 60 mg via INTRAVENOUS
  Administered 2020-02-26: 20 mg via INTRAVENOUS

## 2020-02-26 MED ORDER — ACETAMINOPHEN 325 MG PO TABS: 325.0000 mg | ORAL_TABLET | Freq: Once | ORAL | Status: DC | PRN

## 2020-02-26 MED ORDER — CHLORHEXIDINE GLUCONATE 0.12 % MT SOLN
OROMUCOSAL | Status: AC
  Filled 2020-02-26: qty 15

## 2020-02-26 MED ORDER — TOBRAMYCIN SULFATE 1.2 G IJ SOLR
INTRAMUSCULAR | Status: AC
  Filled 2020-02-26: qty 1.2

## 2020-02-26 MED ORDER — 0.9 % SODIUM CHLORIDE (POUR BTL) OPTIME
TOPICAL | Status: DC | PRN
  Administered 2020-02-26: 1000 mL

## 2020-02-26 MED ORDER — CHLORHEXIDINE GLUCONATE 0.12 % MT SOLN
15.0000 mL | Freq: Once | OROMUCOSAL | Status: AC
  Administered 2020-02-26: 15 mL via OROMUCOSAL

## 2020-02-26 MED ORDER — OXYCODONE HCL 5 MG PO TABS
5.0000 mg | ORAL_TABLET | ORAL | Status: DC | PRN
  Administered 2020-02-26 – 2020-03-01 (×13): 10 mg via ORAL
  Filled 2020-02-26 (×13): qty 2

## 2020-02-26 MED ORDER — DIPHENHYDRAMINE HCL 50 MG/ML IJ SOLN
25.0000 mg | Freq: Four times a day (QID) | INTRAMUSCULAR | Status: DC | PRN
  Administered 2020-02-26 – 2020-02-28 (×4): 25 mg via INTRAVENOUS
  Filled 2020-02-26 (×4): qty 1

## 2020-02-26 MED ORDER — PROPOFOL 10 MG/ML IV BOLUS
INTRAVENOUS | Status: DC | PRN
  Administered 2020-02-26: 130 mg via INTRAVENOUS

## 2020-02-26 MED ORDER — MIDAZOLAM HCL 2 MG/2ML IJ SOLN
INTRAMUSCULAR | Status: AC
  Filled 2020-02-26: qty 2

## 2020-02-26 SURGICAL SUPPLY — 87 items
APL PRP STRL LF DISP 70% ISPRP (MISCELLANEOUS) ×2
APL SKNCLS STERI-STRIP NONHPOA (GAUZE/BANDAGES/DRESSINGS) ×1
BANDAGE ESMARK 6X9 LF (GAUZE/BANDAGES/DRESSINGS) ×1 IMPLANT
BENZOIN TINCTURE PRP APPL 2/3 (GAUZE/BANDAGES/DRESSINGS) ×3 IMPLANT
BIT DRILL QC 2.5MM SHRT EVO SM (DRILL) ×1 IMPLANT
BLADE CLIPPER SURG (BLADE) IMPLANT
BLADE SURG 15 STRL LF DISP TIS (BLADE) ×1 IMPLANT
BLADE SURG 15 STRL SS (BLADE) ×3
BNDG CMPR 9X6 STRL LF SNTH (GAUZE/BANDAGES/DRESSINGS) ×1
BNDG CMPR MED 10X6 ELC LF (GAUZE/BANDAGES/DRESSINGS) ×1
BNDG ELASTIC 4X5.8 VLCR STR LF (GAUZE/BANDAGES/DRESSINGS) ×3 IMPLANT
BNDG ELASTIC 6X10 VLCR STRL LF (GAUZE/BANDAGES/DRESSINGS) ×3 IMPLANT
BNDG ELASTIC 6X5.8 VLCR STR LF (GAUZE/BANDAGES/DRESSINGS) ×3 IMPLANT
BNDG ESMARK 6X9 LF (GAUZE/BANDAGES/DRESSINGS) ×3
BNDG GAUZE ELAST 4 BULKY (GAUZE/BANDAGES/DRESSINGS) ×3 IMPLANT
BRUSH SCRUB EZ PLAIN DRY (MISCELLANEOUS) ×6 IMPLANT
CANISTER SUCT 3000ML PPV (MISCELLANEOUS) ×3 IMPLANT
CHLORAPREP W/TINT 26 (MISCELLANEOUS) ×6 IMPLANT
CLOSURE STERI-STRIP 1/2X4 (GAUZE/BANDAGES/DRESSINGS) ×2
CLSR STERI-STRIP ANTIMIC 1/2X4 (GAUZE/BANDAGES/DRESSINGS) ×4 IMPLANT
COVER SURGICAL LIGHT HANDLE (MISCELLANEOUS) ×3 IMPLANT
COVER WAND RF STERILE (DRAPES) ×3 IMPLANT
CUFF TOURN SGL QUICK 34 (TOURNIQUET CUFF) ×3
CUFF TRNQT CYL 34X4.125X (TOURNIQUET CUFF) ×1 IMPLANT
DRAPE C-ARM 42X72 X-RAY (DRAPES) ×3 IMPLANT
DRAPE C-ARMOR (DRAPES) ×3 IMPLANT
DRAPE ORTHO SPLIT 77X108 STRL (DRAPES) ×6
DRAPE SURG ORHT 6 SPLT 77X108 (DRAPES) ×2 IMPLANT
DRAPE U-SHAPE 47X51 STRL (DRAPES) ×3 IMPLANT
DRILL QC 2.5MM SHORT EVOS SM (DRILL) ×3
DRSG PAD ABDOMINAL 8X10 ST (GAUZE/BANDAGES/DRESSINGS) ×6 IMPLANT
ELECT REM PT RETURN 9FT ADLT (ELECTROSURGICAL) ×3
ELECTRODE REM PT RTRN 9FT ADLT (ELECTROSURGICAL) ×1 IMPLANT
GAUZE SPONGE 4X4 12PLY STRL (GAUZE/BANDAGES/DRESSINGS) ×3 IMPLANT
GLOVE BIO SURGEON STRL SZ 6.5 (GLOVE) ×6 IMPLANT
GLOVE BIO SURGEON STRL SZ7.5 (GLOVE) ×12 IMPLANT
GLOVE BIO SURGEONS STRL SZ 6.5 (GLOVE) ×3
GLOVE BIOGEL PI IND STRL 6.5 (GLOVE) ×1 IMPLANT
GLOVE BIOGEL PI IND STRL 7.5 (GLOVE) ×1 IMPLANT
GLOVE BIOGEL PI INDICATOR 6.5 (GLOVE) ×2
GLOVE BIOGEL PI INDICATOR 7.5 (GLOVE) ×2
GOWN STRL REUS W/ TWL LRG LVL3 (GOWN DISPOSABLE) ×2 IMPLANT
GOWN STRL REUS W/TWL LRG LVL3 (GOWN DISPOSABLE) ×6
IMMOBILIZER KNEE 22 UNIV (SOFTGOODS) ×3 IMPLANT
K-WIRE 1.6 (WIRE) ×6
K-WIRE FX150X1.6XTROC PNT (WIRE) ×2
KIT BASIN OR (CUSTOM PROCEDURE TRAY) ×3 IMPLANT
KIT TURNOVER KIT B (KITS) ×3 IMPLANT
KWIRE FX150X1.6XTROC PNT (WIRE) ×2 IMPLANT
NDL SUT 6 .5 CRC .975X.05 MAYO (NEEDLE) ×1 IMPLANT
NEEDLE MAYO TAPER (NEEDLE) ×3
NS IRRIG 1000ML POUR BTL (IV SOLUTION) ×3 IMPLANT
PACK TOTAL JOINT (CUSTOM PROCEDURE TRAY) ×3 IMPLANT
PAD ABD 8X10 STRL (GAUZE/BANDAGES/DRESSINGS) ×3 IMPLANT
PAD ARMBOARD 7.5X6 YLW CONV (MISCELLANEOUS) ×6 IMPLANT
PAD CAST 4YDX4 CTTN HI CHSV (CAST SUPPLIES) ×2 IMPLANT
PADDING CAST COTTON 4X4 STRL (CAST SUPPLIES) ×6
PADDING CAST COTTON 6X4 STRL (CAST SUPPLIES) ×3 IMPLANT
PLATE T TIB EVOS 3.5X71 4H LT (Plate) ×1 IMPLANT
SCREW CORT EVOS ST 3.5X28 (Screw) ×3 IMPLANT
SCREW CTX 3.5X36MM EVOS (Screw) ×3 IMPLANT
SCREW LOCK 3.5X50MM EVOS (Screw) ×3 IMPLANT
SCREW LOCK EVOS ST 3.5X18 (Screw) ×3 IMPLANT
SCREW LOCK ST 3.5X48 (Screw) ×3 IMPLANT
SCREW LOCK ST EVOS 3.5X38 (Screw) ×3 IMPLANT
STAPLER VISISTAT 35W (STAPLE) ×3 IMPLANT
SUCTION FRAZIER HANDLE 10FR (MISCELLANEOUS) ×3
SUCTION TUBE FRAZIER 10FR DISP (MISCELLANEOUS) ×1 IMPLANT
SUT ETHILON 2 0 FS 18 (SUTURE) ×3 IMPLANT
SUT ETHILON 3 0 PS 1 (SUTURE) IMPLANT
SUT FIBERWIRE #2 38 T-5 BLUE (SUTURE)
SUT MNCRL AB 3-0 PS2 27 (SUTURE) ×3 IMPLANT
SUT MON AB 3-0 SH 27 (SUTURE) ×3
SUT MON AB 3-0 SH27 (SUTURE) ×1 IMPLANT
SUT VIC AB 0 CT1 27 (SUTURE)
SUT VIC AB 0 CT1 27XBRD ANBCTR (SUTURE) IMPLANT
SUT VIC AB 1 CT1 18XCR BRD 8 (SUTURE) IMPLANT
SUT VIC AB 1 CT1 27 (SUTURE) ×3
SUT VIC AB 1 CT1 27XBRD ANBCTR (SUTURE) ×1 IMPLANT
SUT VIC AB 1 CT1 8-18 (SUTURE)
SUT VIC AB 2-0 CT1 27 (SUTURE) ×6
SUT VIC AB 2-0 CT1 TAPERPNT 27 (SUTURE) ×2 IMPLANT
SUTURE FIBERWR #2 38 T-5 BLUE (SUTURE) IMPLANT
T-PLATE TIB EVOS 3.5X71 4H LT (Plate) ×3 IMPLANT
TOWEL GREEN STERILE (TOWEL DISPOSABLE) ×6 IMPLANT
TRAY FOLEY MTR SLVR 16FR STAT (SET/KITS/TRAYS/PACK) IMPLANT
WATER STERILE IRR 1000ML POUR (IV SOLUTION) ×6 IMPLANT

## 2020-02-26 NOTE — Anesthesia Preprocedure Evaluation (Addendum)
Anesthesia Evaluation  Patient identified by MRN, date of birth, ID band Patient awake    Reviewed: Allergy & Precautions, NPO status , Patient's Chart, lab work & pertinent test results  Airway Mallampati: IV  TM Distance: <3 FB Neck ROM: Full  Mouth opening: Limited Mouth Opening  Dental  (+) Teeth Intact, Dental Advisory Given   Pulmonary neg pulmonary ROS,    breath sounds clear to auscultation       Cardiovascular hypertension, Pt. on medications  Rhythm:Regular Rate:Normal     Neuro/Psych PSYCHIATRIC DISORDERS negative neurological ROS     GI/Hepatic negative GI ROS, Neg liver ROS,   Endo/Other  negative endocrine ROS  Renal/GU negative Renal ROS     Musculoskeletal negative musculoskeletal ROS (+)   Abdominal (+) + obese,   Peds  Hematology negative hematology ROS (+)   Anesthesia Other Findings   Reproductive/Obstetrics                            Anesthesia Physical Anesthesia Plan  ASA: II  Anesthesia Plan: General   Post-op Pain Management:    Induction: Intravenous  PONV Risk Score and Plan: 4 or greater and Ondansetron, Dexamethasone, Midazolam and Scopolamine patch - Pre-op  Airway Management Planned: Oral ETT and Video Laryngoscope Planned  Additional Equipment: None  Intra-op Plan:   Post-operative Plan: Extubation in OR  Informed Consent: I have reviewed the patients History and Physical, chart, labs and discussed the procedure including the risks, benefits and alternatives for the proposed anesthesia with the patient or authorized representative who has indicated his/her understanding and acceptance.     Dental advisory given  Plan Discussed with: CRNA  Anesthesia Plan Comments: (Lab Results      Component                Value               Date                      WBC                      7.3                 02/26/2020                HGB                       11.8 (L)            02/26/2020                HCT                      36.2                02/26/2020                MCV                      93.3                02/26/2020                PLT                      270  02/26/2020            No results found for: INR, PROTIME  Covid-19 Nucleic Acid Test Results Lab Results      Component                Value               Date                      SARSCOV2NAA              NEGATIVE            02/25/2020           )      Anesthesia Quick Evaluation

## 2020-02-26 NOTE — Progress Notes (Signed)
[employer] at the bedside for placement of Hinged Knee Brace

## 2020-02-26 NOTE — Anesthesia Postprocedure Evaluation (Signed)
Anesthesia Post Note  [name]  Procedure(s) Performed: OPEN REDUCTION INTERNAL FIXATION (ORIF) TIBIAL PLATEAU (Left Leg Lower)     Anesthesia Post Evaluation No complications documented.  Last Vitals:  Vitals:   02/26/20 0358 02/26/20 0843  BP: (!) 113/57 (!) 120/48  Pulse: 71 74  Resp: 16 18  Temp: 36.8 C 36.7 C  SpO2: 94% 96%    Last Pain:  Vitals:   02/26/20 0934  TempSrc:   PainSc: 10-Worst pain ever                 Amadeo Garnet

## 2020-02-26 NOTE — Consult Note (Signed)
Orthopaedic Trauma Service (OTS) Consult   Patient ID: [name] MRN: [id number] DOB/AGE: 01-12-1956 64 y.o.  Reason for Consult:Left medial tibial plateau fracture Referring Physician: Dr. Wylene Simmer, MD [name]  HPI: [name] is an 64 y.o. female who is being seen in consultation at the request of Dr. Doran Durand for evaluation of left tibial plateau fracture.  Patient has no significant past medical history.  She states that she lives here in [place] now and was taking care of her grandchildren when her knee gave out from underneath her and she heard a crack and she fell.  She presented to the emergency room where x-rays showed a medial comminuted tibial plateau fracture with possible avulsion from the lateral femoral condyle.  Due to the complexity of her injury Dr. Doran Durand felt that this was outside the scope of practice and recommended orthopedic trauma evaluation for definitive management.  Patient was seen and evaluated on 5 N.  She currently relatively comfortable.  Denies any other injuries.  She underwent intramedullary nailing of the left femur fracture in [place].  She has subsequently been here in [place] she is having back pain and was uninsured so she did not got it evaluated.  She has had rhizotomies in the past that have helped a lot with her pain.  She usually ambulates with a walker.  She works Production designer, theatre/television/film for [employer].  She recently has been full-time employed and currently has insurance.  Medical history significant for hypertension  Surgical history significant for previous intramedullary nailing of left femur.  History reviewed. No pertinent family history.  Social History: Occasional alcohol use.  Does not use tobacco anymore.  Allergies: No Known Allergies  Medications:  No current facility-administered medications on file prior to encounter.   Current Outpatient Medications on File Prior to Encounter  Medication Sig Dispense Refill   . diclofenac (VOLTAREN) 75 MG EC tablet Take 75 mg by mouth 2 (two) times daily.    . hydrochlorothiazide (HYDRODIURIL) 25 MG tablet Take 25 mg by mouth daily.      ROS: Constitutional: No fever or chills Vision: No changes in vision ENT: No difficulty swallowing CV: No chest pain Pulm: No SOB or wheezing GI: No nausea or vomiting GU: No urgency or inability to hold urine Skin: No poor wound healing Neurologic: No numbness or tingling Psychiatric: No depression or anxiety Heme: No bruising Allergic: No reaction to medications or food   Exam: Blood pressure (!) 113/57, pulse 71, temperature 98.2 F (36.8 C), temperature source Oral, resp. rate 16, SpO2 94 %. General: No acute distress Orientation: Awake alert and oriented x3 Mood and Affect: Cooperative and pleasant Gait: Unable to assess due to her fracture Coordination and balance: Within normal limits   Left lower extremity: Knee immobilizer is in place.  This was loosened for the exam.  I did not provide stability exam secondary to increased level of pain.  She has moderate swelling but her skin wrinkles medially.  Her compartments are soft compressible.  She has active dorsiflexion plantarflexion of her foot and ankle.  She has a warm well-perfused foot with 2+ DP pulse.  Right lower extremity: Skin without lesions. No tenderness to palpation. Full painless ROM, full strength in each muscle groups without evidence of instability.   Medical Decision Making: Data: Imaging: X-rays and CT scan are reviewed which shows a comminuted medial tibial plateau fracture.  There is some depression of the articular surface.  No signs of any dislocation  or subluxation.  Labs:  Results for orders placed or performed during the hospital encounter of 02/24/20 (from the past 24 hour(s))  HIV Antibody (routine testing w rflx)     Status: None   Collection Time: 02/25/20 12:17 PM  Result Value Ref Range   HIV Screen 4th Generation wRfx Non  Reactive Non Reactive  Hepatic function panel     Status: Abnormal   Collection Time: 02/25/20 12:17 PM  Result Value Ref Range   Total Protein 5.6 (L) 6.5 - 8.1 g/dL   Albumin 3.4 (L) 3.5 - 5.0 g/dL   AST 27 15 - 41 U/L   ALT 29 0 - 44 U/L   Alkaline Phosphatase 80 38 - 126 U/L   Total Bilirubin 0.7 0.3 - 1.2 mg/dL   Bilirubin, Direct 0.2 0.0 - 0.2 mg/dL   Indirect Bilirubin 0.5 0.3 - 0.9 mg/dL  VITAMIN D 25 Hydroxy (Vit-D Deficiency, Fractures)     Status: Abnormal   Collection Time: 02/25/20 12:17 PM  Result Value Ref Range   Vit D, 25-Hydroxy 18.03 (L) 30 - 100 ng/mL  Basic metabolic panel     Status: Abnormal   Collection Time: 02/25/20 12:17 PM  Result Value Ref Range   Sodium 129 (L) 135 - 145 mmol/L   Potassium 3.7 3.5 - 5.1 mmol/L   Chloride 89 (L) 98 - 111 mmol/L   CO2 29 22 - 32 mmol/L   Glucose, Bld 121 (H) 70 - 99 mg/dL   BUN 5 (L) 8 - 23 mg/dL   Creatinine, Ser 0.57 0.44 - 1.00 mg/dL   Calcium 8.6 (L) 8.9 - 10.3 mg/dL   GFR, Estimated >60 >60 mL/min   Anion gap 11 5 - 15  Basic metabolic panel     Status: Abnormal   Collection Time: 02/25/20  4:36 PM  Result Value Ref Range   Sodium 129 (L) 135 - 145 mmol/L   Potassium 3.7 3.5 - 5.1 mmol/L   Chloride 91 (L) 98 - 111 mmol/L   CO2 28 22 - 32 mmol/L   Glucose, Bld 117 (H) 70 - 99 mg/dL   BUN 5 (L) 8 - 23 mg/dL   Creatinine, Ser 0.59 0.44 - 1.00 mg/dL   Calcium 8.6 (L) 8.9 - 10.3 mg/dL   GFR, Estimated >60 >60 mL/min   Anion gap 10 5 - 15  Basic metabolic panel     Status: Abnormal   Collection Time: 02/25/20 11:41 PM  Result Value Ref Range   Sodium 130 (L) 135 - 145 mmol/L   Potassium 3.7 3.5 - 5.1 mmol/L   Chloride 92 (L) 98 - 111 mmol/L   CO2 29 22 - 32 mmol/L   Glucose, Bld 125 (H) 70 - 99 mg/dL   BUN 7 (L) 8 - 23 mg/dL   Creatinine, Ser 0.55 0.44 - 1.00 mg/dL   Calcium 8.2 (L) 8.9 - 10.3 mg/dL   GFR, Estimated >60 >60 mL/min   Anion gap 9 5 - 15  MRSA PCR Screening     Status: None   Collection  Time: 02/26/20 12:15 AM   Specimen: Nasal Mucosa; Nasopharyngeal  Result Value Ref Range   MRSA by PCR NEGATIVE NEGATIVE  Basic metabolic panel     Status: Abnormal   Collection Time: 02/26/20  6:28 AM  Result Value Ref Range   Sodium 133 (L) 135 - 145 mmol/L   Potassium 3.7 3.5 - 5.1 mmol/L   Chloride 96 (L) 98 - 111 mmol/L  CO2 30 22 - 32 mmol/L   Glucose, Bld 113 (H) 70 - 99 mg/dL   BUN 5 (L) 8 - 23 mg/dL   Creatinine, Ser 0.49 0.44 - 1.00 mg/dL   Calcium 8.3 (L) 8.9 - 10.3 mg/dL   GFR, Estimated >60 >60 mL/min   Anion gap 7 5 - 15  CBC     Status: Abnormal   Collection Time: 02/26/20  6:28 AM  Result Value Ref Range   WBC 7.3 4.0 - 10.5 K/uL   RBC 3.88 3.87 - 5.11 MIL/uL   Hemoglobin 11.8 (L) 12.0 - 15.0 g/dL   HCT 36.2 36 - 46 %   MCV 93.3 80.0 - 100.0 fL   MCH 30.4 26.0 - 34.0 pg   MCHC 32.6 30.0 - 36.0 g/dL   RDW 13.2 11.5 - 15.5 %   Platelets 270 150 - 400 K/uL   nRBC 0.0 0.0 - 0.2 %    Imaging or Labs ordered: None  Medical history and chart was reviewed and case discussed with medical provider.  Assessment/Plan: 64 year old female with a medial tibial plateau fracture  Patient has a complex problem.  She requires open reduction internal fixation to restore the articular surface and restore the stability of her knee.  I discussed risks and benefits of proceeding with surgical intervention.  Risks included but not limited to bleeding, infection, malunion, nonunion, hardware failure, hardware irritation, nerve or blood vessel injury, posttraumatic arthritis, knee stiffness, need for further surgery including total knee arthroplasty, DVT, even the possibility of anesthetic complications.  The patient agreed to proceed with surgery and consent was obtained.  Shona Needles, MD [facility] [phone] (office) [email or web address]

## 2020-02-26 NOTE — Op Note (Addendum)
Orthopaedic Surgery Operative Note (CSN: [id number] ) Date of Surgery: 02/26/2020  Admit Date: 02/24/2020   Diagnoses: Pre-Op Diagnoses: Left medial tibial plateau fracture   Post-Op Diagnosis: Left medial tibial plateau fracture Left PCL/LCL injury Left knee posterior capsular disruption  Procedures: CPT 27536-Open reduction internal fixation of left tibial plateau fracture   Surgeons : Primary: Shona Needles, MD  Assistant: Patrecia Pace, PA-C  Location: OR 7   Anesthesia:General  Antibiotics: Ancef 3gm preop with 1 gm vancomycin powder and 1.2 gm tobramycin powder placed topically.   Tourniquet time:None  Estimated Blood FBPZ:025 mL  Complications:None   Specimens:None   Implants: Implant Name Type Inv. Item Serial No. Manufacturer Lot No. LRB No. Used Action  SCREW CTX 3.5X36MM EVOS - [id number] Screw SCREW CTX 3.5X36MM EVOS  [employer]  Left 1 Implanted  SCREW CORT EVOS ST 3.5X28 - [id number] Screw SCREW CORT EVOS ST 3.5X28  [employer]  Left 1 Implanted  four hole t plate left    [employer]  Left 1 Implanted  SCREW LOCK 3.5X50MM EVOS - [id number] Screw SCREW LOCK 3.5X50MM EVOS  [employer]  Left 1 Implanted  SCREW LOCK ST EVOS 3.5X38 - [id number] Screw SCREW LOCK ST EVOS 3.5X38  [employer]  Left 1 Implanted  SCREW LOCK ST 3.5X48 - [id number] Screw SCREW LOCK ST 3.5X48  [employer]  Left 1 Implanted  SCREW LOCK EVOS ST 3.5X18 - [id number] Screw SCREW LOCK EVOS ST 3.5X18  [employer]  Left 1 Implanted     Indications for Surgery: 64 year old female who sustained an injury to her left knee.  She was found to have a left anterior medial tibial plateau fracture that extended into the intercondylar notch.  Due to the unstable nature of her injury I recommend proceeding with open reduction internal fixation.  Risks included but not limited to bleeding,  infection, malunion, nonunion, hardware failure, hardware irritation, nerve and blood vessel injury, posttraumatic arthritis, DVT, knee instability, even the possibility of anesthesia complications.  Operative Findings: 1.  Unstable left knee with significant hyperextension and varus instability 2.  Open reduction internal fixation of left tibial plateau fracture using [employer] EVOS medial T locking plate. 3.  Improve stability after fixation of the anterior medial fragment with some residual hyperextension of the knee correction of varus instability.  Procedure: The patient was identified in the preoperative holding area. Consent was confirmed with the patient and their family and all questions were answered. The operative extremity was marked after confirmation with the patient. she was then brought back to the operating room by our anesthesia colleagues.  She was placed under general anesthetic and carefully transferred over to a radiolucent flat top table.  The left lower extremity was then prepped and draped in usual sterile fashion.  A timeout was performed to verify the patient, the procedure, and the extremity.  Preoperative antibiotics were dosed.  Fluoroscopic imaging was obtained to show the unstable nature of her injury.  She had severe varus instability along with significant hyperextension.  Due to the unstable nature of her anterior medial plateau fragment I proceeded with a medial parapatellar incision.  I carried down through skin and subcutaneous tissue.  I incised through the capsule cranial to the meniscus.  I made sure that I protected the meniscus insertion.  I then mobilized the patellar tendon to expose the fracture.  I tried to keep all the  soft tissue attachments including the deep MCL and the capsule attached to the bony fragment.  I was able to visualize the articular impaction and used a Publishing copy to elevate this up.  I then reduced the anterior medial  fragment that was underneath the meniscus in place and held provisionally with a K wire.  Fluoroscopic imaging was used to confirm adequate reduction.  I then used a T medial EVOS locking plate from [employer] in place this to buttress that anterior medial fragment.  I held it provisionally with a K wire.  I then placed nonlocking screws into the tibial shaft to bring the plate flush to bone.  I then placed a locking screw into the metaphysis.  I then placed 3 locking screws just underneath the articular surface to hold and raft the disimpaction of the joint.  I then obtained final fluoroscopic imaging.  A stress examination under anesthesia was performed which showed significantly improved varus stability.  She did have some hyperextension of her knee however it was nowhere near the amount that was preoperatively.  At this time I felt that no further intervention with surgical repair was needed necessary and that the remainder would heal with bracing.  The incision was copiously irrigated.  A gram of vancomycin 1.2 g of tobramycin powder were placed into the incision.  The capsule was closed with #1 Vicryl.  The skin was closed with 0 Vicryl and 3-0 Monocryl.  Steri-Strips were placed.  Sterile dressing was placed to the left knee.  The patient was then placed back in a knee immobilizer.  The patient was then awoken from anesthesia and taken to the PACU in stable condition.  Post Op Plan/Instructions: Patient be nonweightbearing to left lower extremity.  We will keep her in a hinged knee brace with about 5 degrees of flexion to prevent any hyperextension of the knee.  We will plan to mobilize her with physical and Occupational Therapy.  We will start Lovenox for DVT prophylaxis.  She will receive postoperative Ancef for surgical prophylaxis.  I was present and performed the entire surgery.  Patrecia Pace, PA-C did assist me throughout the case. An assistant was necessary given the difficulty in  approach, maintenance of reduction and ability to instrument the fracture.   Katha Hamming, MD [facility]

## 2020-02-26 NOTE — Progress Notes (Signed)
Date: 02/26/2020  Patient name: [name]  Medical record number: [id number]  Date of birth: 11/26/1955   I have seen and evaluated [name] and discussed their care with the Residency Team.  In brief, patient is a 64 year old female with a past medical history of morbid obesity, bipolar disorder, hypertension who presented to the ED with left leg pain status post fall.  Patient states that she was taking care of her grandchildren on the day prior to admission and tripped on an air mattress at approximately 8 PM.  Patient states that she heard a popping sound in crunching prior to hitting the floor.  Patient had severe pain after this and was unable to bear weight.  She was brought to the ED for further evaluation.  No chest pain, no shortness of breath, no palpitations, no lightheadedness, no syncope, no focal weakness, no tingling or numbness, no fevers or chills, no nausea or vomiting, no diarrhea, no abdominal pain.  Of note, patient was started on hydrochlorothiazide on October 28 for her hypertension.  Her serum sodium at that time was 134.  He did also endorse some blurry vision over the last 2 to 3 weeks.  She followed up with and optometrist for this and is found that her vision was unchanged.  Today, patient was complaining of severe pain in her left lower extremity and complains of toes feeling cold.  PMHx, Fam Hx, and/or Soc Hx : As per resident admit note  Vitals:   02/26/20 0358 02/26/20 0843  BP: (!) 113/57 (!) 120/48  Pulse: 71 74  Resp: 16 18  Temp: 98.2 F (36.8 C) 98 F (36.7 C)  SpO2: 94% 96%   General: Awake, alert, oriented x3, significant distress secondary to pain CVS: Regular rate and rhythm, normal heart sounds Lungs: CTA bilaterally Abdomen: Soft, nontender, nondistended, normoactive bowel sounds Extremities: Left lower extremity in immobilizer.  No tenderness to palpation of the toes of the right lower extremity Psych: Patient appears anxious and in  significant distress secondary to pain HEENT: Normocephalic, atraumatic Skin: Warm and dry Neuro: Oriented x3, patient able to move the toes in her left foot and has intact sensation over her left lower extremity toes  Assessment and Plan: I have seen and evaluated the patient as outlined above. I agree with the formulated Assessment and Plan as detailed in the residents' note, with the following changes:   1.  Acute left tibial plateau fracture and fracture of left fibula with lateral collateral ligament avulsion injury status post fall: -Patient presented to the ED after a fall at home and was found to have an acute left tibial plateau fracture as well as fracture of her left fibula and lateral collateral ligament avulsion injury on imaging. -We will continue with pain control with IV Dilaudid for now -Ortho follow-up recommendations appreciated.  Patient is status post open reduction and internal fixation of left tibial plateau fracture. -Maintain nonweightbearing of left lower extremity for now -PT/OT to evaluate patient in a.m. -Patient was also noted to have vitamin D deficiency.  We will continue vitamin D supplementation at this time -Patient's fall was likely mechanical but patient was also noted to be hyponatremic with a sodium 124 on admission.  This may have contributed to her fall. -Continue Lovenox for DVT prophylaxis -Patient need to maintain a hinged knee brace with about 5 degrees flexion to prevent hyperextension of the knee  2.  Hyponatremia secondary to HCTZ: -Patient was noted to have a sodium 124  on admission in setting of recent initiation of HCTZ for hypertension.  Patient may also have a component of SIADH secondary to pain which contributed to her hyponatremia. -HCTZ was held and patient was initially started on IV hydration with improvement of her sodium from 124 to 129.  IV fluids were held at this point and patient's sodium correction over the first 24 hours was  approximately 6 mEq which is at goal -Patient repeat sodium today was 133.  Goal sodium for the next 24 hours is between 134-136.  We will continue to monitor her sodium closely -No further work-up at this time  Aldine Contes, MD 11/8/20212:42 PM

## 2020-02-26 NOTE — Plan of Care (Signed)

## 2020-02-26 NOTE — Transfer of Care (Signed)
Immediate Anesthesia Transfer of Care Note  Patient: Karren Newland  Procedure(s) Performed: OPEN REDUCTION INTERNAL FIXATION (ORIF) TIBIAL PLATEAU (Left Leg Lower)  Patient Location: PACU  Anesthesia Type:General  Level of Consciousness: awake, alert  and oriented  Airway & Oxygen Therapy: Patient Spontanous Breathing and Patient connected to face mask oxygen  Post-op Assessment: Report given to RN, Post -op Vital signs reviewed and stable and Patient moving all extremities  Post vital signs: Reviewed and stable  Last Vitals:  Vitals Value Taken Time  BP 176/76 02/26/20 1410  Temp    Pulse 74 02/26/20 1419  Resp 23 02/26/20 1419  SpO2 100 % 02/26/20 1419  Vitals shown include unvalidated device data.  Last Pain:  Vitals:   02/26/20 0934  TempSrc:   PainSc: 10-Worst pain ever         Complications: No complications documented.

## 2020-02-26 NOTE — Progress Notes (Addendum)
Subjective:  [name] is a 64 year old female with past medical history of morbid obesity, bipolar disorder, prior left femur fracture, alcohol use disorder and hypertension who presented to [facility] on the evening of 11/06 after a fall resulting in a left tibial plateau fracture, LCL injury, and fibular fracture found to have hyponatremia (124), now awaiting orthopedic intervention.  Overnight, no acute events.  This morning, patient reports that she is in significant pain. She complains of her left lower extremity feeling cold. She reports being told by orthopedic surgery that she will be taken to the operating room at 12:30PM today. She has no further questions or concerns.  Objective:  Vital signs in last 24 hours: Vitals:   02/25/20 1616 02/25/20 2007 02/26/20 0358 02/26/20 0843  BP: (!) 113/59 (!) 113/52 (!) 113/57 (!) 120/48  Pulse: 77 73 71 74  Resp: 19 16 16 18   Temp: 97.8 F (36.6 C) 98.1 F (36.7 C) 98.2 F (36.8 C) 98 F (36.7 C)  TempSrc: Oral Oral Oral Oral  SpO2: 97% 97% 94% 96%  SpO2: 96 %  Intake/Output Summary (Last 24 hours) at 02/26/2020 1111 Last data filed at 02/26/2020 0900 Gross per 24 hour  Intake 240 ml  Output 2300 ml  Net -2060 ml  There were no vitals filed for this visit. Physical Exam Vitals and nursing note reviewed. Exam conducted with a chaperone present.  Constitutional:      General: She is in acute distress.     Appearance: She is obese.  HENT:     Head: Normocephalic and atraumatic.  Eyes:     Extraocular Movements: Extraocular movements intact.     Conjunctiva/sclera: Conjunctivae normal.  Cardiovascular:     Rate and Rhythm: Normal rate and regular rhythm.     Pulses: Normal pulses.     Heart sounds: Normal heart sounds.  Pulmonary:     Breath sounds: Normal breath sounds.  Abdominal:     General: Abdomen is flat. Bowel sounds are normal.     Palpations: Abdomen is soft.     Tenderness: There is no abdominal tenderness.   Musculoskeletal:     Cervical back: Normal range of motion and neck supple.     Comments: Patient's left lower extremity in immbolizer. She is able to move her toes on left lower extremity.  Skin:    General: Skin is warm and dry.     Capillary Refill: Capillary refill takes less than 2 seconds.  Neurological:     General: No focal deficit present.     Mental Status: She is alert. Mental status is at baseline.     Comments: Patient reports intact sensation in left lower extremity and toes.  Psychiatric:        Mood and Affect: Mood normal.        Thought Content: Thought content normal.        Judgment: Judgment normal.    LABS: CBC Latest Ref Rng & Units 02/26/2020 02/25/2020 04/18/2008  WBC 4.0 - 10.5 K/uL 7.3 10.1 8.5  Hemoglobin 12.0 - 15.0 g/dL 11.8(L) 13.1 11.8(L)  Hematocrit 36 - 46 % 36.2 37.2 34.5(L)  Platelets 150 - 400 K/uL 270 277 171   BMP Latest Ref Rng & Units 02/26/2020 02/25/2020 02/25/2020  Glucose 70 - 99 mg/dL 113(H) 125(H) 117(H)  BUN 8 - 23 mg/dL 5(L) 7(L) 5(L)  Creatinine 0.44 - 1.00 mg/dL 0.49 0.55 0.59  Sodium 135 - 145 mmol/L 133(L) 130(L) 129(L)  Potassium 3.5 -  5.1 mmol/L 3.7 3.7 3.7  Chloride 98 - 111 mmol/L 96(L) 92(L) 91(L)  CO2 22 - 32 mmol/L 30 29 28   Calcium 8.9 - 10.3 mg/dL 8.3(L) 8.2(L) 8.6(L)   25-OH Vitamin D - 18.03 Parathyroid hormone - in process  IMAGING: CT Knee Left Wo Contrast  Result Date: 02/25/2020 CLINICAL DATA:  Tibial plateau fracture. EXAM: CT OF THE LEFT KNEE WITHOUT CONTRAST TECHNIQUE: Multidetector CT imaging of the left knee was performed according to the standard protocol. Multiplanar CT image reconstructions were also generated. COMPARISON:  Left knee radiographs, earlier today FINDINGS: Bones/Joint/Cartilage There is an acute, comminuted, intra-articular fracture involving the medial tibial plateau. There is mild depression of the anterior fracture fragments by approximately 5 mm. Signs of lateral collateral ligament  avulsion injury is also noted with small osseous fragments noted adjacent to the lateral femoral condyle and lateral tibial plateau, image 46/6 and image 51/6. Additionally, nondisplaced fracture of the styloid process of the fibula is suspected which may be related to lateral collateral ligament injury and/or arcuate ligament avulsion fracture., image 81/3. IM nail and screws are noted within the distal femoral diaphysis. Ligaments Suboptimally assessed by CT. Muscles and Tendons Unremarkable. Soft tissues Small suprapatellar joint effusion. Diffuse soft tissue stranding is identified within the subcutaneous fat surrounding the left knee. Small radio dense foreign body is noted within the subcutaneous soft tissues anterior to the proximal tibia measures 2 mm. IMPRESSION: 1. Acute, comminuted, intra-articular fracture involving the medial tibial plateau with mild depression of the anterior fracture fragments. 2. Signs of lateral collateral ligament avulsion injury is noted with small osseous fragments adjacent to the lateral femoral condyle and lateral tibial plateau. 3. Nondisplaced fracture of the styloid process of the fibula is suspected which may be related to lateral collateral ligament injury and/or arcuate ligament avulsion fracture. 4. Small suprapatellar joint effusion. 5. Small radio dense foreign body is noted within the subcutaneous soft tissues anterior to the proximal tibia. Electronically Signed   By: Kerby Moors M.D.   On: 02/25/2020 05:26   DG Knee Complete 4 Views Left  Result Date: 02/25/2020 CLINICAL DATA:  Pain EXAM: LEFT KNEE - COMPLETE 4+ VIEW COMPARISON:  None. FINDINGS: There is an acute, depressed medial tibial plateau fracture. There is a small suprapatellar joint effusion. Mild tricompartmental degenerative changes are noted. There is soft tissue swelling about the knee. The patient has undergone prior intramedullary nail placement through the femur. The second most distal  interlocking screw is bent. IMPRESSION: 1. Acute, depressed medial tibial plateau fracture. 2. Mild tricompartmental degenerative changes are noted. Electronically Signed   By: Constance Holster M.D.   On: 02/25/2020 03:02   Assessment/Plan:  Principal Problem:   Tibial plateau fracture, left Active Problems:   Tibial plateau fracture   Hyponatremia   Alcohol use disorder, moderate, dependence ([place])   Essential hypertension  [name] is a 64 year old female with past medical history of morbid obesity, bipolar disorder, prior left femur fracture, alcohol use disorder and hypertension who presented to [facility] on the evening of 11/06 after a fall resulting in a left tibial plateau fracture, LCL injury, and fibular fracture found to have hyponatremia (124), now awaiting orthopedic intervention.  #Left tibial plateau fracture with medial condylar depression, active #Fracture of fibular styloid process, active #Lateral collateral ligament avulsion injury, active Patient presenting with fractures with ligament injury following fall. Orthopedic surgery on board with plan to operate this morning.  -Knee immobilizer pending surgery -Nonweightbearing of left lower extremity -  Operation today -Continue pain regimen:  -Dilaudid 0.5-1mg  Q2H PRN  #Vitamin D deficiency, active Patient's 25-OH vitamin D level 18.03. -Start on vitamin D supplementation (50,000 units weekly) for 6-8 weeks -Following loading, switch to daily supplementation  #Hyponatremia, improving  Sodium initially 124 on arrival, most recently 133 this morning. Likely multifactorial given her HCTZ as well as her alcohol use and pain which can cause SIADH. -Daily BMP  #HTN, chronic -Discontinued HCTZ in setting of hyponatremia -Plan to follow-up with PCP for alternative treatment  #Alcohol Use Disorder, chronic Reports drinking approximately a 6-pack of beer three to four times a week. -Placed on CIWA  #VTE ppx:  SCDs #Diet: NPO #Code status: Full code #Bowel regimen: Senna twice daily  Cato Mulligan, MD 02/26/2020, 11:11 AM Pager: [phone] After 5pm on weekdays and 1pm on weekends: On Call pager [phone]

## 2020-02-26 NOTE — Anesthesia Procedure Notes (Signed)
Procedure Name: Intubation Date/Time: 02/26/2020 12:24 PM Performed by: Amadeo Garnet, CRNA Pre-anesthesia Checklist: Patient identified, Emergency Drugs available and Suction available Patient Re-evaluated:Patient Re-evaluated prior to induction Oxygen Delivery Method: Circle system utilized Preoxygenation: Pre-oxygenation with 100% oxygen Induction Type: IV induction Ventilation: Mask ventilation without difficulty Laryngoscope Size: Glidescope and 4 Grade View: Grade I Tube type: Oral Tube size: 7.0 mm Number of attempts: 1 Airway Equipment and Method: Stylet and Oral airway Placement Confirmation: ETT inserted through vocal cords under direct vision,  positive ETCO2 and breath sounds checked- equal and bilateral Secured at: 22 cm Tube secured with: Tape Dental Injury: Teeth and Oropharynx as per pre-operative assessment

## 2020-02-26 NOTE — Progress Notes (Signed)
Orthopedic Tech Progress Note Patient Details:  [name] 23-Aug-1955 [id number] Called in order to [employer] for a Coffee. LOCKED in 5 degrees flexion   Patient ID: [name], female   DOB: [birth year], 64 y.o.   MRN: [id number]   [name] 02/26/2020, 3:34 PM

## 2020-02-26 NOTE — Progress Notes (Signed)
Patient was given Dilaudid 2 mg  IV for acute severe pain.

## 2020-02-27 ENCOUNTER — Encounter (HOSPITAL_COMMUNITY): Payer: Self-pay | Admitting: Student

## 2020-02-27 LAB — BASIC METABOLIC PANEL
Anion gap: 10 (ref 5–15)
Anion gap: 8 (ref 5–15)
Anion gap: 8 (ref 5–15)
Anion gap: 7 (ref 5–15)
BUN: <5 mg/dL — ABNORMAL LOW (ref 8–23)
BUN: <5 mg/dL — ABNORMAL LOW (ref 8–23)
BUN: <5 mg/dL — ABNORMAL LOW (ref 8–23)
BUN: 7 mg/dL — ABNORMAL LOW (ref 8–23)
CO2: 23 mmol/L (ref 22–32)
CO2: 29 mmol/L (ref 22–32)
CO2: 32 mmol/L (ref 22–32)
CO2: 29 mmol/L (ref 22–32)
Calcium: 8.4 mg/dL — ABNORMAL LOW (ref 8.9–10.3)
Calcium: 8.6 mg/dL — ABNORMAL LOW (ref 8.9–10.3)
Calcium: 8.7 mg/dL — ABNORMAL LOW (ref 8.9–10.3)
Calcium: 8.6 mg/dL — ABNORMAL LOW (ref 8.9–10.3)
Chloride: 99 mmol/L (ref 98–111)
Chloride: 97 mmol/L — ABNORMAL LOW (ref 98–111)
Chloride: 94 mmol/L — ABNORMAL LOW (ref 98–111)
Chloride: 94 mmol/L — ABNORMAL LOW (ref 98–111)
Creatinine, Ser: 0.49 mg/dL (ref 0.44–1.00)
Creatinine, Ser: 0.48 mg/dL (ref 0.44–1.00)
Creatinine, Ser: 0.48 mg/dL (ref 0.44–1.00)
Creatinine, Ser: 0.63 mg/dL (ref 0.44–1.00)
GFR, Estimated: >60 mL/min (ref >60)
GFR, Estimated: >60 mL/min (ref >60)
GFR, Estimated: >60 mL/min (ref >60)
GFR, Estimated: >60 mL/min (ref >60)
Glucose, Bld: 145 mg/dL — ABNORMAL HIGH (ref 70–99)
Glucose, Bld: 135 mg/dL — ABNORMAL HIGH (ref 70–99)
Glucose, Bld: 133 mg/dL — ABNORMAL HIGH (ref 70–99)
Glucose, Bld: 160 mg/dL — ABNORMAL HIGH (ref 70–99)
Potassium: 4.9 mmol/L (ref 3.5–5.1)
Potassium: 4.6 mmol/L (ref 3.5–5.1)
Potassium: 4.2 mmol/L (ref 3.5–5.1)
Potassium: 4.5 mmol/L (ref 3.5–5.1)
Sodium: 132 mmol/L — ABNORMAL LOW (ref 135–145)
Sodium: 134 mmol/L — ABNORMAL LOW (ref 135–145)
Sodium: 134 mmol/L — ABNORMAL LOW (ref 135–145)
Sodium: 130 mmol/L — ABNORMAL LOW (ref 135–145)

## 2020-02-27 LAB — CBC
HCT: 35.2 % — ABNORMAL LOW (ref 36.0–46.0)
Hemoglobin: 11.4 g/dL — ABNORMAL LOW (ref 12.0–15.0)
MCH: 30.6 pg (ref 26.0–34.0)
MCHC: 32.4 g/dL (ref 30.0–36.0)
MCV: 94.4 fL (ref 80.0–100.0)
Platelets: 241 10*3/uL (ref 150–400)
RBC: 3.73 MIL/uL — ABNORMAL LOW (ref 3.87–5.11)
RDW: 13.2 % (ref 11.5–15.5)
WBC: 9 10*3/uL (ref 4.0–10.5)
nRBC: 0 % (ref 0.0–0.2)

## 2020-02-27 LAB — PARATHYROID HORMONE, INTACT (NO CA): PTH: 40 pg/mL (ref 15–65)

## 2020-02-27 MED ORDER — GABAPENTIN 300 MG PO CAPS
300.0000 mg | ORAL_CAPSULE | Freq: Three times a day (TID) | ORAL | Status: DC
  Administered 2020-02-27 – 2020-03-01 (×11): 300 mg via ORAL
  Filled 2020-02-27 (×11): qty 1

## 2020-02-27 MED ORDER — KETOROLAC TROMETHAMINE 15 MG/ML IJ SOLN
15.0000 mg | Freq: Four times a day (QID) | INTRAMUSCULAR | Status: DC
  Administered 2020-02-27 – 2020-02-29 (×9): 15 mg via INTRAVENOUS
  Filled 2020-02-27 (×9): qty 1

## 2020-02-27 MED ORDER — ACETAMINOPHEN 650 MG RE SUPP: 650.0000 mg | Freq: Four times a day (QID) | RECTAL | Status: DC

## 2020-02-27 MED ORDER — ACETAMINOPHEN 325 MG PO TABS
650.0000 mg | ORAL_TABLET | Freq: Four times a day (QID) | ORAL | Status: DC
  Administered 2020-02-27 – 2020-03-01 (×14): 650 mg via ORAL
  Filled 2020-02-27 (×14): qty 2

## 2020-02-27 NOTE — Progress Notes (Addendum)
Rehab Admissions Coordinator Note:  Per PT and OT recommendation, this patient was screened by [name] for appropriateness for an Inpatient Acute Rehab Consult.  At this time, we are recommending Inpatient Rehab consult. AC will contact MD to request consult order.   [name] 02/27/2020, 4:46 PM  I can be reached at [phone].

## 2020-02-27 NOTE — TOC CAGE-AID Note (Signed)
Transition of Care [facility]) - CAGE-AID Screening   Patient Details  Name: [name] MRN: [id number] Date of Birth: 1956-04-17  Transition of Care [facility] CM/SW Contact:    Emeterio Reeve, [place] Phone Number: 02/27/2020, 3:34 PM   Clinical Narrative:  CSW met with pt at bedside. CSW introduced self and explained her role at the hospital.  Pt reports that she is an alcoholic who had 10 years of clean time. Pt reports she began drinking again de to personal issues. Pt reports she has a therapist. Pt declined resources stating she knows what's out there and how to access it when she is ready, but states shes not ready right now.   CSW explained that its recommended that pt goes to SNF and that she wont be able to drink there. Pt reports she understands and states that she will be fine not having alcohol since she will be away from her stressor. Pt also expressed worry about bills while out of work. CSW offered pt [employer], pt declined.    CAGE-AID Screening:    Have You Ever Felt You Ought to Cut Down on Your Drinking or Drug Use?: Yes Have People Annoyed You By Critizing Your Drinking Or Drug Use?: No Have You Felt Bad Or Guilty About Your Drinking Or Drug Use?: No Have You Ever Had a Drink or Used Drugs First Thing In The Morning to Steady Your Nerves or to Get Rid of a Hangover?: No CAGE-AID Score: 1  Substance Abuse Education Offered: Yes    Blima Ledger, [place] Social Worker [phone]

## 2020-02-27 NOTE — Evaluation (Signed)
Occupational Therapy Evaluation Patient Details Name: [name] MRN: [id number] DOB: 08-Dec-1955 Today's Date: 02/27/2020    History of Present Illness Pt is a 64 y.o. female admitted 02/24/20 after fall sustaining L tibial plateau fx with lateral collateral ligament avulsion injury. S/p L tibial ORIF on 02/26/20. PMH includes L femur IMN, HTN, obesity, bipolar disorder, ETOH use.   Clinical Impression   PTA, pt lives with husband and reports Modified Independence for ADLs, IADLs in the home and mobility using RW or quad cane. Pt works from home and reports back pain has chronically limited pt's QOL. Pt presents now s/p fx with NWB L LE status. Pt with deficits in pain, strength, endurance, balance and ability to maintain precautions. Pt able to demo bed mobility at [place] A using leg lifter but requires cues for light lifting of LE to maintain WB precautions. Pt able to demo scooting alongside bed at min guard with plan to locate drop arm recliner for scooting or sliding transfer training. Attempted standing with Max A, but pt unable to achieve full upright posture with 1 person assist. Pt requires Min A for UB ADLs and Total A for LB ADLs. Extended time spent discussing home setup, DME needs and compensatory strategies for ADLs. Recommend CIR for intensive therapies. If unable to go to CIR, pt will need [facility] rehab at [facility].     Follow Up Recommendations  CIR;Supervision/Assistance - 24 hour;Other (comment) (SNF if unable to go to CIR)    Equipment Recommendations  3 in 1 bedside commode;Tub/shower bench;Wheelchair (measurements OT);Wheelchair cushion (measurements OT);Other (comment) (RW; bariatric DME)    Recommendations for Other Services Rehab consult     Precautions / Restrictions Precautions Precautions: Fall Required Braces or Orthoses: Other Brace Other Brace: L knee hinged brace locked in 5-10' flexion Restrictions Weight Bearing Restrictions: Yes LLE Weight Bearing: Non weight bearing       Mobility Bed Mobility Overal bed mobility: Needs Assistance Bed Mobility: Supine to Sit;Sit to Supine     Supine to sit: Min assist;HOB elevated Sit to supine: Min assist;HOB elevated   General bed mobility comments: Min A using gait belt as leg lifter and HOB slightly elevated. Heavy use of bed rail. Cued to avoid pulling too hard on leg lifter, use only to lightly lift to advance LE with continued reinforcement needed    Transfers Overall transfer level: Needs assistance Equipment used: Rolling walker (2 wheeled);None Transfers: Sit to/from Stand;Lateral/Scoot Transfers           General transfer comment: Pt able to scoot along bedside with great effort at min guard. No drop arm recliner in room to advance OOB today. Attempted sit to stand trial with RW with pt able to clear hips slightly but unable to transition to upright posture with 1 person assist. Pt also noted to have difficulty maintaining NWB with standing attempt. Plan to trial Bariatric walker next time and 2 person assist     Balance Overall balance assessment: Needs assistance;History of Falls Sitting-balance support: No upper extremity supported;Feet supported Sitting balance-Leahy Scale: Good                                     ADL either performed or assessed with clinical judgement   ADL Overall ADL's : Needs assistance/impaired Eating/Feeding: Independent;Sitting   Grooming: Set up;Sitting   Upper Body Bathing: Minimal assistance;Sitting   Lower Body Bathing: Maximal assistance;Sitting/lateral leans  Upper Body Dressing : Set up;Sitting   Lower Body Dressing: Total assistance;Sitting/lateral leans;Sit to/from stand Lower Body Dressing Details (indicate cue type and reason): Discussed easier clothing options for dressing tasks      Toileting- Clothing Manipulation and Hygiene: Total assistance;Bed level         General ADL Comments: Limited by pain, NWB status and  increased weight in ability to mobilize OOB without extensive assist      Vision Baseline Vision/History: No visual deficits Patient Visual Report: No change from baseline Vision Assessment?: No apparent visual deficits     Perception     Praxis      Pertinent Vitals/Pain Pain Assessment: Faces Faces Pain Scale: Hurts whole lot Pain Location: L LE with movement after session Pain Descriptors / Indicators: Grimacing;Guarding;Moaning Pain Intervention(s): Limited activity within patient's tolerance;Monitored during session;Repositioned;RN gave pain meds during session     Hand Dominance Right   Extremity/Trunk Assessment Upper Extremity Assessment Upper Extremity Assessment: Overall WFL for tasks assessed   Lower Extremity Assessment Lower Extremity Assessment: Defer to PT evaluation LLE Deficits / Details: s/p L tibial ORIF locked in 10' flex hinged brace; hip functionally <3/5, likely limited by post-op pain and weakness LLE: Unable to fully assess due to pain;Unable to fully assess due to immobilization LLE Coordination: decreased gross motor;decreased fine motor       Communication Communication Communication: No difficulties   Cognition Arousal/Alertness: Awake/alert Behavior During Therapy: WFL for tasks assessed/performed;Anxious Overall Cognitive Status: Within Functional Limits for tasks assessed                                 General Comments: Anxiety regarding pain with therapy, looks for feedback, implements safety techniques after cueing    General Comments  Extended time spent discussing home setup with husband sending pictures of bathroom to pt during session. Unsure if bathroom wheelchair accessible, etc. Pt with increased awareness of deficits, agreeable to rehab now.     Exercises     Shoulder Instructions      Home Living Family/patient expects to be discharged to:: Private residence Living Arrangements: Spouse/significant  other Available Help at Discharge: Family Type of Home: House Home Access: Ramped entrance     [place]: One level     Bathroom Shower/Tub: Teacher, early years/pre: Standard (low toilet) Bathroom Accessibility: Yes How Accessible: Accessible via walker Home Equipment: Walker - 2 wheels;Hand held shower head;Adaptive equipment;Cane - quad Adaptive Equipment: Other (Comment) (leg lifter) Additional Comments: Ramp being set-up today      Prior Functioning/Environment Level of Independence: Independent with assistive device(s)        Comments: Pt reports being Modified Independent using RW or quad cane for mobility. Pt able to complete ADLs, IADLs in the home. Limited by back pain. Uses electric shopping cart in store        OT Problem List: Decreased strength;Decreased activity tolerance;Impaired balance (sitting and/or standing);Decreased safety awareness;Decreased knowledge of use of DME or AE;Decreased knowledge of precautions;Pain;Obesity      OT Treatment/Interventions: Self-care/ADL training;Therapeutic exercise;DME and/or AE instruction;Therapeutic activities;Patient/family education    OT Goals(Current goals can be found in the care plan section) Acute Rehab OT Goals Patient Stated Goal: be able to take care of self independently  OT Goal Formulation: With patient Time For Goal Achievement: 03/12/20 Potential to Achieve Goals: Good ADL Goals Pt Will Perform Lower Body Bathing: with min assist;sitting/lateral leans  Pt Will Perform Lower Body Dressing: with min assist;sitting/lateral leans Pt Will Transfer to Toilet: with max assist;stand pivot transfer;bedside commode Pt Will Perform Toileting - Clothing Manipulation and hygiene: with min assist;sitting/lateral leans  OT Frequency: Min 2X/week   Barriers to D/C:            Co-evaluation              AM-PAC OT "6 Clicks" Daily Activity     Outcome Measure Help from another person eating  meals?: None Help from another person taking care of personal grooming?: A Little Help from another person toileting, which includes using toliet, bedpan, or urinal?: Total Help from another person bathing (including washing, rinsing, drying)?: A Lot Help from another person to put on and taking off regular upper body clothing?: A Little Help from another person to put on and taking off regular lower body clothing?: Total 6 Click Score: 14   End of Session Equipment Utilized During Treatment: Gait belt;Rolling walker;Other (comment) (knee brace) Nurse Communication: Mobility status  Activity Tolerance: Patient limited by pain Patient left: in bed;with call bell/phone within reach;with bed alarm set  OT Visit Diagnosis: Unsteadiness on feet (R26.81);Other abnormalities of gait and mobility (R26.89);Muscle weakness (generalized) (M62.81);History of falling (Z91.81);Pain Pain - Right/Left: Left Pain - part of body: Leg                Time: 3943-2003 OT Time Calculation (min): 49 min Charges:  OT General Charges $OT Visit: 1 Visit OT Evaluation $OT Eval Moderate Complexity: 1 Mod OT Treatments $Self Care/Home Management : 8-22 mins $Therapeutic Activity: 8-22 mins  Layla Maw, OTR/L  Layla Maw 02/27/2020, 2:34 PM

## 2020-02-27 NOTE — NC FL2 (Signed)
[place] LEVEL OF CARE SCREENING TOOL     IDENTIFICATION  Patient Name: [name] Birthdate: [birth year] Sex: female Admission Date (Current Location): 02/24/2020  [facility] and [place] Number:  Herbalist and Address:  [facility], [place] [address] [place], [place] [address]      Provider Number: [id number]  Attending Physician Name and Address:  Sid Falcon, MD  Relative Name and Phone Number:  Mckenze Slone, spouse - [phone]    Current Level of Care: Hospital Recommended Level of Care: [place] Prior Approval Number:    Date Approved/Denied:   PASRR Number: [id number]  Discharge Plan: SNF    Current Diagnoses: Patient Active Problem List   Diagnosis Date Noted  . Tibial plateau fracture 02/25/2020  . Tibial plateau fracture, left 02/25/2020  . Hyponatremia 02/25/2020  . Alcohol use disorder, moderate, dependence ([place]) 02/25/2020  . Essential hypertension 02/25/2020    Orientation RESPIRATION BLADDER Height & Weight     Self, Time, Situation, Place  Normal Continent Weight: 107.2 kg Height:  5\' 8"  (172.7 cm)  BEHAVIORAL SYMPTOMS/MOOD NEUROLOGICAL BOWEL NUTRITION STATUS      Continent Diet (See discharge summary)  AMBULATORY STATUS COMMUNICATION OF NEEDS Skin   Total Care Verbally Surgical wounds                       Personal Care Assistance Level of Assistance  Bathing, Dressing Bathing Assistance: Limited assistance   Dressing Assistance: Limited assistance     Functional Limitations Info  Sight, Hearing, Speech Sight Info: Adequate Hearing Info: Adequate Speech Info: Adequate    SPECIAL CARE FACTORS FREQUENCY  PT (By licensed PT), OT (By licensed OT)     PT Frequency: 5 x per week OT Frequency: 5 x per week            Contractures Contractures Info: Not present    Additional Factors Info  Code Status, Allergies, Psychotropic Code Status Info: full  code Allergies Info: NKDA Psychotropic Info: Neurontin         Current Medications (02/27/2020):  This is the current hospital active medication list Current Facility-Administered Medications  Medication Dose Route Frequency Provider Last Rate Last Admin  . acetaminophen (TYLENOL) tablet 650 mg  650 mg Oral Q6H Delray Alt, PA-C   650 mg at 02/27/20 1356   Or  . acetaminophen (TYLENOL) suppository 650 mg  650 mg Rectal Q6H Patrecia Pace A, PA-C      . diphenhydrAMINE (BENADRYL) injection 25 mg  25 mg Intravenous Q6H PRN Patrecia Pace A, PA-C   25 mg at 02/26/20 2040  . enoxaparin (LOVENOX) injection 40 mg  40 mg Subcutaneous Q24H Patrecia Pace A, PA-C   40 mg at 02/27/20 0849  . gabapentin (NEURONTIN) capsule 300 mg  300 mg Oral TID Patrecia Pace A, PA-C   300 mg at 02/27/20 1401  . HYDROmorphone (DILAUDID) injection 0.5-1 mg  0.5-1 mg Intravenous Q3H PRN Delray Alt, PA-C   1 mg at 02/27/20 5638  . ketorolac (TORADOL) 15 MG/ML injection 15 mg  15 mg Intravenous Q6H Patrecia Pace A, PA-C   15 mg at 02/27/20 1356  . lactated ringers infusion   Intravenous Continuous Delray Alt, PA-C 10 mL/hr at 02/26/20 1104 New Bag at 02/26/20 1331  . methocarbamol (ROBAXIN) tablet 500 mg  500 mg Oral Q6H PRN Patrecia Pace A, PA-C   500 mg at 02/27/20 0443  .  oxyCODONE (Oxy IR/ROXICODONE) immediate release tablet 5-10 mg  5-10 mg Oral Q4H PRN Patrecia Pace A, PA-C   10 mg at 02/27/20 1050  . senna (SENOKOT) tablet 8.6 mg  1 tablet Oral BID Patrecia Pace A, PA-C   8.6 mg at 02/27/20 0849  . Vitamin D (Ergocalciferol) (DRISDOL) capsule 50,000 Units  50,000 Units Oral Q7 days Delray Alt, PA-C   50,000 Units at 02/26/20 2258     Discharge Medications: Please see discharge summary for a list of discharge medications.  Relevant Imaging Results:  Relevant Lab Results:   Additional Information SS# [id number]  Curlene Labrum, RN

## 2020-02-27 NOTE — Progress Notes (Signed)
Orthopaedic Trauma Progress Note  S: Doing fair this morning. Pain in left leg, rates it 6/10 currently. States that she has difficulty ambulating with her walker at baseline. Although not documented patient states she occasionally takes her husband's gabapentin at home for nerve pain related to her left hip and she also back. She has also gotten gabapentin prescribed for her dog in the past for which she has taken due to having no insurance at the time. Patient also notes that she is having some decreased sensation over the top of her left foot. She is unsure how long this has been going on or if this is new since her recent injury. She has not been out of bed yet since surgery yesterday afternoon. She is very anxious regarding therapy. Patient has a family history of osteoporosis, is asking about getting a bone scan while here in the hospital for evaluation of this. Was seen by her primary care doctor about 2 weeks ago and was started on D2 supplementation.  I was contacted by Sheliah Hatch yesterday afternoon regarding locking of Bledsoe brace. Brace is only able to be locked in increments of 10 degrees.  O:  Vitals:   02/27/20 0330 02/27/20 0700  BP: (!) 162/67 (!) 150/75  Pulse: 74 78  Resp: 17 17  Temp: 97.6 F (36.4 C) 98.1 F (36.7 C)  SpO2: 100% 100%    General: Sitting up in bed, no acute distress Respiratory:  No increased work of breathing.  Left Lower Extremity: Bledsoe brace in place locked in 10 degrees of flexion. Dressing is clean, dry, intact. Some decrease sensation noted over the dorsal aspect of the foot when compared to contralateral side. Able to wiggle toes. Foot warm and well-perfused. Compartments of compressible. Otherwise neurovascularly intact  Imaging: Stable post op imaging.  Labs:  Results for orders placed or performed during the hospital encounter of 02/24/20 (from the past 24 hour(s))  Basic metabolic panel     Status: Abnormal   Collection Time: 02/26/20   5:08 PM  Result Value Ref Range   Sodium 133 (L) 135 - 145 mmol/L   Potassium 4.2 3.5 - 5.1 mmol/L   Chloride 94 (L) 98 - 111 mmol/L   CO2 28 22 - 32 mmol/L   Glucose, Bld 152 (H) 70 - 99 mg/dL   BUN 5 (L) 8 - 23 mg/dL   Creatinine, Ser 0.70 0.44 - 1.00 mg/dL   Calcium 8.4 (L) 8.9 - 10.3 mg/dL   GFR, Estimated >60 >60 mL/min   Anion gap 11 5 - 15  Basic metabolic panel     Status: Abnormal   Collection Time: 02/26/20 11:19 PM  Result Value Ref Range   Sodium 132 (L) 135 - 145 mmol/L   Potassium 4.9 3.5 - 5.1 mmol/L   Chloride 99 98 - 111 mmol/L   CO2 23 22 - 32 mmol/L   Glucose, Bld 145 (H) 70 - 99 mg/dL   BUN <5 (L) 8 - 23 mg/dL   Creatinine, Ser 0.49 0.44 - 1.00 mg/dL   Calcium 8.4 (L) 8.9 - 10.3 mg/dL   GFR, Estimated >60 >60 mL/min   Anion gap 10 5 - 15  Basic metabolic panel     Status: Abnormal   Collection Time: 02/27/20  1:31 AM  Result Value Ref Range   Sodium 134 (L) 135 - 145 mmol/L   Potassium 4.6 3.5 - 5.1 mmol/L   Chloride 97 (L) 98 - 111 mmol/L   CO2 29  22 - 32 mmol/L   Glucose, Bld 135 (H) 70 - 99 mg/dL   BUN <5 (L) 8 - 23 mg/dL   Creatinine, Ser 0.48 0.44 - 1.00 mg/dL   Calcium 8.6 (L) 8.9 - 10.3 mg/dL   GFR, Estimated >60 >60 mL/min   Anion gap 8 5 - 15  CBC     Status: Abnormal   Collection Time: 02/27/20  1:31 AM  Result Value Ref Range   WBC 9.0 4.0 - 10.5 K/uL   RBC 3.73 (L) 3.87 - 5.11 MIL/uL   Hemoglobin 11.4 (L) 12.0 - 15.0 g/dL   HCT 35.2 (L) 36 - 46 %   MCV 94.4 80.0 - 100.0 fL   MCH 30.6 26.0 - 34.0 pg   MCHC 32.4 30.0 - 36.0 g/dL   RDW 13.2 11.5 - 15.5 %   Platelets 241 150 - 400 K/uL   nRBC 0.0 0.0 - 0.2 %    Assessment: 64 year old female status post fall, 1 Day Post-Op   Injuries:1.  Left medial tibial plateau fracture s/p ORIF 2. Left PCL/LCL injury 3. Left knee posterior capsular disruption  Weightbearing: NWB LLE  Insicional and dressing care: Plan to remove dressing tomorrow to evaluate incision  Showering: Okay to begin  showering on 02/29/2020 with assistance  Orthopedic device(s): Bledsoe brace left knee, locked in 10 degrees flexion   CV/Blood loss: Acute blood loss anemia, Hgb 11.4 this morning. Hemodynamically stable  Pain management:  1. Tylenol 650 mg q 6 hours scheduled 2. Robaxin 500 mg q 6 hours PRN 3. Oxycodone 5-10 mg q 4 hours PRN 4. Neurontin 300 mg TID 5. Dilaudid 0.5-1 mg q 3 hours PRN 6. Toradol 15 mg q 6 hours x 5 doses  VTE prophylaxis: Lovenox, SCD RLE  ID:  Ancef 2gm post op  Foley/Lines:  No foley, KVO IVFs  Medical co-morbidities: morbid obesity, bipolar disorder, hypertension  Impediments to Fracture Healing: Vit D level 18, continue vitamin D supplementation  Dispo: PT/OT eval, dispo pending.   Follow - up plan: 2 weeks after discharge for repeat x-rays  Contact information:  Katha Hamming MD, Patrecia Pace PA-C   Miria Cappelli A. [facility] [phone] (office) [email or web address]

## 2020-02-27 NOTE — Plan of Care (Signed)

## 2020-02-27 NOTE — Progress Notes (Signed)
Subjective:  [name] is a 64 year old female with past medical history of morbid obesity, bipolar disorder, prior left femur fracture, alcohol use disorder and hypertension who presented to [facility] on the evening of 11/06 after a fall resulting in a left tibial plateau fracture, LCL injury, and fibular fracture found to have hyponatremia (124), now awaiting orthopedic intervention.  Overnight, no acute events.  This morning, patient reports that she is very upset. She reports having excruciating pain throughout her left lower extremity, particularly in her ankle. She has no other concerns or questions.    Objective:  Vital signs in last 24 hours: Vitals:   02/26/20 1806 02/26/20 2245 02/27/20 0211 02/27/20 0330  BP: (!) 147/72 (!) 160/62 (!) 144/67 (!) 162/67  Pulse: 76   74  Resp: 17 17 16 17   Temp: 98.4 F (36.9 C) 97.9 F (36.6 C) 98 F (36.7 C) 97.6 F (36.4 C)  TempSrc: Oral Oral Oral Oral  SpO2:  97% 99% 100%  Weight: 107.2 kg     Height: 5\' 8"  (1.727 m)     On room air  Intake/Output Summary (Last 24 hours) at 02/27/2020 0648 Last data filed at 02/27/2020 0600 Gross per 24 hour  Intake 1779.58 ml  Output 3225 ml  Net -1445.42 ml   Filed Weights   02/26/20 1806  Weight: 107.2 kg   Physical Exam Vitals and nursing note reviewed. Exam conducted with a chaperone present.  Constitutional:      General: She is in acute distress.     Appearance: She is obese.  HENT:     Head: Normocephalic and atraumatic.  Eyes:     Extraocular Movements: Extraocular movements intact.     Conjunctiva/sclera: Conjunctivae normal.  Cardiovascular:     Rate and Rhythm: Normal rate and regular rhythm.     Pulses: Normal pulses.     Heart sounds: Normal heart sounds.  Pulmonary:     Breath sounds: Normal breath sounds.  Abdominal:     General: Abdomen is flat. Bowel sounds are normal.     Palpations: Abdomen is soft.     Tenderness: There is no abdominal tenderness.   Musculoskeletal:     Cervical back: Normal range of motion and neck supple.     Comments: Patient's left lower extremity wrapped and in knee brace.  Skin:    General: Skin is warm and dry.     Capillary Refill: Capillary refill takes less than 2 seconds.  Neurological:     General: No focal deficit present.     Mental Status: She is alert. Mental status is at baseline.     Comments: Patient reports intact sensation in left lower extremity and toes.  Psychiatric:        Mood and Affect: Mood normal.        Thought Content: Thought content normal.        Judgment: Judgment normal.     Comments: Tearful, very anxious    LABS: CBC Latest Ref Rng & Units 02/27/2020 02/26/2020 02/25/2020  WBC 4.0 - 10.5 K/uL 9.0 7.3 10.1  Hemoglobin 12.0 - 15.0 g/dL 11.4(L) 11.8(L) 13.1  Hematocrit 36 - 46 % 35.2(L) 36.2 37.2  Platelets 150 - 400 K/uL 241 270 277   BMP Latest Ref Rng & Units 02/27/2020 02/26/2020 02/26/2020  Glucose 70 - 99 mg/dL 135(H) 145(H) 152(H)  BUN 8 - 23 mg/dL <5(L) <5(L) 5(L)  Creatinine 0.44 - 1.00 mg/dL 0.48 0.49 0.70  Sodium 135 -  145 mmol/L 134(L) 132(L) 133(L)  Potassium 3.5 - 5.1 mmol/L 4.6 4.9 4.2  Chloride 98 - 111 mmol/L 97(L) 99 94(L)  CO2 22 - 32 mmol/L 29 23 28   Calcium 8.9 - 10.3 mg/dL 8.6(L) 8.4(L) 8.4(L)   25-OH Vitamin D - 18.03 Parathyroid hormone - 40  IMAGING: DG Tibia/Fibula Left  Result Date: 02/26/2020 CLINICAL DATA:  ORIF tibial plateau fracture. EXAM: LEFT TIBIA AND FIBULA - 2 VIEW; DG C-ARM 1-60 MIN FLUOROSCOPY TIME:  1 minute, 21 seconds (7.9 mGy) COMPARISON:  Left knee radiographs-02/25/2020; left knee CT-02/25/2020 FINDINGS: Seven spot intraoperative fluoroscopic images of the left knee are provided for review and demonstrate the C quell a of sideplate fixation of previously identified impacted medial tibial plateau fracture. Alignment appears near anatomic. Note is also made of the distal end of a intramedullary femoral rod, incompletely evaluated.  No radiopaque foreign body. IMPRESSION: Post sideplate fixation of medial tibial plateau fracture without evidence of complication. Electronically Signed   By: Sandi Mariscal M.D.   On: 02/26/2020 13:51   CT Knee Left Wo Contrast  Result Date: 02/25/2020 CLINICAL DATA:  Tibial plateau fracture. EXAM: CT OF THE LEFT KNEE WITHOUT CONTRAST TECHNIQUE: Multidetector CT imaging of the left knee was performed according to the standard protocol. Multiplanar CT image reconstructions were also generated. COMPARISON:  Left knee radiographs, earlier today FINDINGS: Bones/Joint/Cartilage There is an acute, comminuted, intra-articular fracture involving the medial tibial plateau. There is mild depression of the anterior fracture fragments by approximately 5 mm. Signs of lateral collateral ligament avulsion injury is also noted with small osseous fragments noted adjacent to the lateral femoral condyle and lateral tibial plateau, image 46/6 and image 51/6. Additionally, nondisplaced fracture of the styloid process of the fibula is suspected which may be related to lateral collateral ligament injury and/or arcuate ligament avulsion fracture., image 81/3. IM nail and screws are noted within the distal femoral diaphysis. Ligaments Suboptimally assessed by CT. Muscles and Tendons Unremarkable. Soft tissues Small suprapatellar joint effusion. Diffuse soft tissue stranding is identified within the subcutaneous fat surrounding the left knee. Small radio dense foreign body is noted within the subcutaneous soft tissues anterior to the proximal tibia measures 2 mm. IMPRESSION: 1. Acute, comminuted, intra-articular fracture involving the medial tibial plateau with mild depression of the anterior fracture fragments. 2. Signs of lateral collateral ligament avulsion injury is noted with small osseous fragments adjacent to the lateral femoral condyle and lateral tibial plateau. 3. Nondisplaced fracture of the styloid process of the fibula is  suspected which may be related to lateral collateral ligament injury and/or arcuate ligament avulsion fracture. 4. Small suprapatellar joint effusion. 5. Small radio dense foreign body is noted within the subcutaneous soft tissues anterior to the proximal tibia. Electronically Signed   By: Kerby Moors M.D.   On: 02/25/2020 05:26   DG Knee Complete 4 Views Left  Result Date: 02/25/2020 CLINICAL DATA:  Pain EXAM: LEFT KNEE - COMPLETE 4+ VIEW COMPARISON:  None. FINDINGS: There is an acute, depressed medial tibial plateau fracture. There is a small suprapatellar joint effusion. Mild tricompartmental degenerative changes are noted. There is soft tissue swelling about the knee. The patient has undergone prior intramedullary nail placement through the femur. The second most distal interlocking screw is bent. IMPRESSION: 1. Acute, depressed medial tibial plateau fracture. 2. Mild tricompartmental degenerative changes are noted. Electronically Signed   By: Constance Holster M.D.   On: 02/25/2020 03:02   DG Knee Left Port  Result Date: 02/26/2020  CLINICAL DATA:  64 year old female postoperative films following ORIF. EXAM: PORTABLE LEFT KNEE - 1-2 VIEW COMPARISON:  Intraoperative images 12 45 hours today. FINDINGS: AP and cross-table lateral views. Pre-existing distal left femur intramedullary rod and distal interlocking cortical screws. New anterior/medial proximal tibia plate and screws. No adverse hardware features. No new osseous abnormality identified. IMPRESSION: Proximal left tibia ORIF with no adverse features. Electronically Signed   By: Genevie Ann M.D.   On: 02/26/2020 18:13   DG C-Arm 1-60 Min  Result Date: 02/26/2020 CLINICAL DATA:  ORIF tibial plateau fracture. EXAM: LEFT TIBIA AND FIBULA - 2 VIEW; DG C-ARM 1-60 MIN FLUOROSCOPY TIME:  1 minute, 21 seconds (7.9 mGy) COMPARISON:  Left knee radiographs-02/25/2020; left knee CT-02/25/2020 FINDINGS: Seven spot intraoperative fluoroscopic images of the left  knee are provided for review and demonstrate the C quell a of sideplate fixation of previously identified impacted medial tibial plateau fracture. Alignment appears near anatomic. Note is also made of the distal end of a intramedullary femoral rod, incompletely evaluated. No radiopaque foreign body. IMPRESSION: Post sideplate fixation of medial tibial plateau fracture without evidence of complication. Electronically Signed   By: Sandi Mariscal M.D.   On: 02/26/2020 13:51   Assessment/Plan:  Principal Problem:   Tibial plateau fracture, left Active Problems:   Tibial plateau fracture   Hyponatremia   Alcohol use disorder, moderate, dependence ([place])   Essential hypertension  [name] is a 64 year old female with past medical history of morbid obesity, bipolar disorder, prior left femur fracture, alcohol use disorder and hypertension who presented to [facility] on the evening of 11/06 after a fall resulting in a left tibial plateau fracture, LCL injury, and fibular fracture found to have hyponatremia (124), now awaiting orthopedic intervention.  #Left medial tibial plateau fracture, PCL/LCL injury, knee posterior capsular disruption s/p ORIF Patient presented with fractures with ligament injury following fall. Patient underwent orthopedic surgery on 94/08/8590 without complications. Following the operation, she reports persistent significant pain. -Escalate pain regimen per ortho:  -Oxycodone 5-10mg  Q4H PRN  -Robaxin 500mg  Q6H PRN  -Hydromorphone 0.5-1mg  Q3H PRN  -Toradol 15mg  Q6H  -Gabapentin 300mg  TID  -Tylenol 650mg  Q6H -PT/OT eval and treat -CBC tomorrow morning -Orthopedics following  #Hyponatremia, improving  Sodium initially 124 on arrival, most recently 134 this morning. Likely multifactorial given her HCTZ as well as her alcohol use and pain which can cause SIADH. -BMP this evening, monitor sodium -Daily BMP -Holding home HCTZ  #HTN, chronic -Discontinued HCTZ in setting of  hyponatremia -Plan to follow-up with PCP for alternative treatment  #Vitamin D deficiency, active Patient's 25-OH vitamin D level 18.03. -Start on vitamin D supplementation (50,000 units weekly) for 6-8 weeks -Following loading, switch to daily supplementation  #Alcohol Use Disorder, chronic Reports drinking approximately a 6-pack of beer three to four times a week. -Placed on CIWA  #VTE ppx: Lovenox #Diet: Heart healthy #Code status: Full code #Bowel regimen: Senna twice daily #PT/OT recs: pending  Cato Mulligan, MD 02/27/2020, 6:48 AM Pager: [phone] After 5pm on weekdays and 1pm on weekends: On Call pager [phone]

## 2020-02-27 NOTE — Evaluation (Signed)
Physical Therapy Evaluation Patient Details Name: [name] MRN: [id number] DOB: Jun 27, 1955 Today's Date: 02/27/2020   History of Present Illness  Pt is a 64 y.o. female admitted 02/24/20 after fall sustaining L tibial plateau fx with lateral collateral ligament avulsion injury. S/p L tibial ORIF on 02/26/20. PMH includes L femur IMN, HTN, obesity, bipolar disorder, ETOH use.    Clinical Impression  Pt presents with an overall decrease in functional mobility secondary to above. PTA, pt independent, lives with husband, and enjoys spending time with grandkids. Educ on LLE NWB precautions, brace wear, positioning, and importance of mobility. Today, pt required maxA for bed mobility; unable to stand to RW or pivot to recliner with assist+1, limited by pain and weakness. Will require +2 assist for OOB mobility. Discussed recommendation for post-acute rehab services to maximize functional mobility and independence prior to return home; pt unsure, hopeful for d/c home with husband's assist. Will follow acutely to address established goals.    Follow Up Recommendations CIR;Supervision for mobility/OOB    Equipment Recommendations   (TBD)    Recommendations for Other Services OT consult;Rehab consult     Precautions / Restrictions Precautions Precautions: Fall Required Braces or Orthoses: Other Brace Other Brace: L knee hinged brace locked in 5-10' flexion Restrictions Weight Bearing Restrictions: Yes LLE Weight Bearing: Non weight bearing      Mobility  Bed Mobility Overal bed mobility: Needs Assistance Bed Mobility: Supine to Sit;Sit to Supine     Supine to sit: Max assist Sit to supine: Max assist   General bed mobility comments: Pt assisting well with RLE and BUE support, reliant on bed rails; maxA to manage LLE, limited by significant pain    Transfers Overall transfer level: Needs assistance               General transfer comment: Attempted 2x stand from EOB with RW, pt  able to minimally offload buttocks, but unable to initiate extension to stand despite maxA; limited by pain and weakness, difficulty maintaining LLE NWB precautions with anterior weight translation  Ambulation/Gait                Stairs            Wheelchair Mobility    Modified Rankin (Stroke Patients Only)       Balance Overall balance assessment: Needs assistance   Sitting balance-Leahy Scale: Fair                                       Pertinent Vitals/Pain Pain Assessment: Faces Faces Pain Scale: Hurts worst Pain Location: Pt rates 5/10 pain at rest; faces pain scale 10/10 with mobility Pain Descriptors / Indicators: Grimacing;Guarding;Moaning Pain Intervention(s): Premedicated before session;Repositioned;Monitored during session    Home Living Family/patient expects to be discharged to:: Private residence (Simultaneous filing. User may not have seen previous data.) Living Arrangements: Spouse/significant other (Simultaneous filing. User may not have seen previous data.) Available Help at Discharge: Family;Available 24 hours/day (Simultaneous filing. User may not have seen previous data.) Type of Home: House (Simultaneous filing. User may not have seen previous data.) Home Access: Ramped entrance (Simultaneous filing. User may not have seen previous data.)     Home Layout: One level (Simultaneous filing. User may not have seen previous data.) Home Equipment: Walker - 2 wheels (Simultaneous filing. User may not have seen previous data.) Additional Comments: Ramp being set-up today (Simultaneous filing.  User may not have seen previous data.)    Prior Function Level of Independence: Independent (Simultaneous filing. User may not have seen previous data.)         Comments: Enjoys spending time with 2 grandkids who live nearby     Hand Dominance        Extremity/Trunk Assessment   Upper Extremity Assessment Upper Extremity Assessment:  Overall WFL for tasks assessed    Lower Extremity Assessment Lower Extremity Assessment: LLE deficits/detail LLE Deficits / Details: s/p L tibial ORIF locked in 10' flex hinged brace; hip functionally <3/5, likely limited by post-op pain and weakness LLE: Unable to fully assess due to pain;Unable to fully assess due to immobilization LLE Coordination: decreased gross motor;decreased fine motor       Communication   Communication: No difficulties  Cognition Arousal/Alertness: Awake/alert Behavior During Therapy: WFL for tasks assessed/performed;Anxious Overall Cognitive Status: Within Functional Limits for tasks assessed                                 General Comments: Some anxiety related to pain/mobility and current amount of assist pt needing; becoming tearful at end of session but responds well to encouragement      General Comments General comments (skin integrity, edema, etc.): Increased time discussing d/c recommendations; pt hopeful for return home (not interested in SNF) - educ on current assist level needs and DME needs    Exercises     Assessment/Plan    PT Assessment Patient needs continued PT services  PT Problem List Decreased strength;Decreased range of motion;Decreased activity tolerance;Decreased balance;Decreased mobility;Decreased knowledge of use of DME;Decreased knowledge of precautions;Obesity;Pain       PT Treatment Interventions DME instruction;Gait training;Functional mobility training;Therapeutic activities;Therapeutic exercise;Balance training;Patient/family education;Wheelchair mobility training    PT Goals (Current goals can be found in the Care Plan section)  Acute Rehab PT Goals Patient Stated Goal: Return home with help from husband PT Goal Formulation: With patient Time For Goal Achievement: 03/12/20 Potential to Achieve Goals: Fair    Frequency Min 5X/week   Barriers to discharge        Co-evaluation                AM-PAC PT "6 Clicks" Mobility  Outcome Measure Help needed turning from your back to your side while in a flat bed without using bedrails?: A Lot Help needed moving from lying on your back to sitting on the side of a flat bed without using bedrails?: A Lot Help needed moving to and from a bed to a chair (including a wheelchair)?: Total Help needed standing up from a chair using your arms (e.g., wheelchair or bedside chair)?: Total Help needed to walk in hospital room?: Total Help needed climbing 3-5 steps with a railing? : Total 6 Click Score: 8    End of Session Equipment Utilized During Treatment: Gait belt Activity Tolerance: Patient limited by pain Patient left: in bed;with call bell/phone within reach;with bed alarm set Nurse Communication: Mobility status PT Visit Diagnosis: Other abnormalities of gait and mobility (R26.89);Pain;Muscle weakness (generalized) (M62.81) Pain - Right/Left: Left Pain - part of body: Leg;Knee    Time: 1001-1033 PT Time Calculation (min) (ACUTE ONLY): 32 min   Charges:   PT Evaluation $PT Eval Moderate Complexity: 1 Mod PT Treatments $Therapeutic Activity: 8-22 mins       Mabeline Caras, PT, DPT Acute Rehabilitation Services  Pager [phone] Office [phone]  Derry Lory 02/27/2020, 1:56 PM

## 2020-02-27 NOTE — TOC Initial Note (Signed)
Transition of Care [facility]) - Initial/Assessment Note    Patient Details  Name: [name] MRN: [id number] Date of Birth: 01-Nov-1955  Transition of Care [facility]) CM/SW Contact:    Curlene Labrum, RN Phone Number: 02/27/2020, 1:59 PM  Clinical Narrative:                 Case management met with the patient at the bedside and the patient states that she is willing to go to rehab for SNF placement.  I'm waiting on PT evaluation and note since the patient was unable to stand or participate in PT this morning.  I gave the patient information for choice regarding SNF placement and I will present offers to the patient tomorrow once the PT evaluation is done and note is placed.  The patient would like [name] to be her first choice if they are in network with the patient's insurance.  Patient is fully vaccinated for COVID and copy is in her wallet.  Will continue to follow the patient for [facility] placement - need to give patient bed choice once available.  Expected Discharge Plan: [place] Barriers to Discharge: No Barriers Identified (waiting on PT evaluation)   Patient Goals and CMS Choice Patient states their goals for this hospitalization and ongoing recovery are:: Patient plans to discharge to a SNF facility. CMS Medicare.gov Compare Post Acute Care list provided to:: Patient Choice offered to / list presented to : Patient  Expected Discharge Plan and Services Expected Discharge Plan: [place]   Discharge Planning Services: CM Consult Post Acute Care Choice: [place] Living arrangements for the past 2 months: Single Family Home                                      Prior Living Arrangements/Services Living arrangements for the past 2 months: Single Family Home Lives with:: Spouse Patient language and need for interpreter reviewed:: Yes Do you feel safe going back to the place where you live?: Yes      Need for Family  Participation in Patient Care: Yes (Comment) Care giver support system in place?: Yes (comment) Current home services: DME (Patient has RW, WC and Cane at home - needs raised bari toilet seat) Criminal Activity/Legal Involvement Pertinent to Current Situation/Hospitalization: No - Comment as needed  Activities of Daily Living Home Assistive Devices/Equipment: None ADL Screening (condition at time of admission) Patient's cognitive ability adequate to safely complete daily activities?: Yes Is the patient deaf or have difficulty hearing?: No Does the patient have difficulty seeing, even when wearing glasses/contacts?: No Does the patient have difficulty concentrating, remembering, or making decisions?: No Patient able to express need for assistance with ADLs?: Yes Does the patient have difficulty dressing or bathing?: No Independently performs ADLs?: Yes (appropriate for developmental age) Does the patient have difficulty walking or climbing stairs?: Yes Weakness of Legs: Both Weakness of Arms/Hands: None  Permission Sought/Granted Permission sought to share information with : Case Manager Permission granted to share information with : Yes, Verbal Permission Granted     Permission granted to share info w AGENCY: SNF facility  Permission granted to share info w Relationship: spouse - [name]     Emotional Assessment Appearance:: Appears stated age Attitude/Demeanor/Rapport: Engaged Affect (typically observed): Accepting Orientation: : Oriented to Self, Oriented to Place, Oriented to  Time, Oriented to Situation Alcohol / Substance Use: Alcohol Use Psych  Involvement: No (comment)  Admission diagnosis:  Tibial plateau fracture [S82.143A] Tibial plateau fracture, left [S82.142A] Closed fracture of medial portion of left tibial plateau, initial encounter [S82.132A] Patient Active Problem List   Diagnosis Date Noted  . Tibial plateau fracture 02/25/2020  . Tibial plateau fracture, left  02/25/2020  . Hyponatremia 02/25/2020  . Alcohol use disorder, moderate, dependence ([place]) 02/25/2020  . Essential hypertension 02/25/2020   PCP:  [name], MD Pharmacy:   [facility] [phone] [address], [address] AT [facility] [place] [address] Phone: [phone] Fax: [phone]     Social Determinants of Health ([place]) Interventions    Readmission Risk Interventions Readmission Risk Prevention Plan 02/27/2020  Post Dischage Appt Complete  Medication Screening Complete  Transportation Screening Complete  Some recent data might be hidden

## 2020-02-28 ENCOUNTER — Inpatient Hospital Stay (HOSPITAL_COMMUNITY): Payer: BC Managed Care – PPO

## 2020-02-28 LAB — CBC
HCT: 33 % — ABNORMAL LOW (ref 36.0–46.0)
Hemoglobin: 10.9 g/dL — ABNORMAL LOW (ref 12.0–15.0)
MCH: 31.5 pg (ref 26.0–34.0)
MCHC: 33 g/dL (ref 30.0–36.0)
MCV: 95.4 fL (ref 80.0–100.0)
Platelets: 247 10*3/uL (ref 150–400)
RBC: 3.46 MIL/uL — ABNORMAL LOW (ref 3.87–5.11)
RDW: 13.3 % (ref 11.5–15.5)
WBC: 7.2 10*3/uL (ref 4.0–10.5)
nRBC: 0 % (ref 0.0–0.2)

## 2020-02-28 LAB — BASIC METABOLIC PANEL
Anion gap: 9 (ref 5–15)
BUN: 8 mg/dL (ref 8–23)
CO2: 29 mmol/L (ref 22–32)
Calcium: 8.6 mg/dL — ABNORMAL LOW (ref 8.9–10.3)
Chloride: 97 mmol/L — ABNORMAL LOW (ref 98–111)
Creatinine, Ser: 0.52 mg/dL (ref 0.44–1.00)
GFR, Estimated: >60 mL/min (ref >60)
Glucose, Bld: 99 mg/dL (ref 70–99)
Potassium: 4.5 mmol/L (ref 3.5–5.1)
Sodium: 135 mmol/L (ref 135–145)

## 2020-02-28 LAB — GLUCOSE, CAPILLARY
Glucose-Capillary: 119 mg/dL — ABNORMAL HIGH (ref 70–99)
Glucose-Capillary: 109 mg/dL — ABNORMAL HIGH (ref 70–99)

## 2020-02-28 MED ORDER — AMLODIPINE BESYLATE 5 MG PO TABS
5.0000 mg | ORAL_TABLET | Freq: Every day | ORAL | Status: DC
  Administered 2020-02-28 – 2020-03-01 (×3): 5 mg via ORAL
  Filled 2020-02-28 (×3): qty 1

## 2020-02-28 NOTE — Progress Notes (Signed)
Inpatient Rehab Admissions:  Inpatient Rehab Consult received.  I met with pt at the bedside for rehabilitation assessment and to discuss goals and expectations of an inpatient rehab admission. Feel pt is an appropriate candidate for CIR. Pt would like to pursue this program and is very motivated. Confirmed DC support with pt's husband. AC will begin insurance auth process for possible admit.   Raechel Ache, OTR/L  Rehab Admissions Coordinator  [phone] 02/28/2020 4:00 PM

## 2020-02-28 NOTE — Progress Notes (Signed)
Occupational Therapy Treatment Patient Details Name: [name] MRN: [id number] DOB: 1955-08-25 Today's Date: 02/28/2020    History of present illness Pt is a 64 y.o. female admitted 02/24/20 after fall sustaining L tibial plateau fx with lateral collateral ligament avulsion injury. S/p L tibial ORIF on 02/26/20. PMH includes L femur IMN, HTN, obesity, bipolar disorder, ETOH use.   OT comments  Pt progressing towards goals and highly motivated to attempt transfer to [facility] for BM today. Pt overall Max A x 2 for scooting/squat pivot transfers to [facility] and recliner chair. Pt receptive of feedback and assists in problem solving for safety. Pt requires Total A for posterior hygiene (reports use of bidet at home due to difficulty reaching). Pt may benefit from AE education and further compensatory strategies for LB ADLs during next session. Continue to recommend CIR.    Follow Up Recommendations  CIR;Supervision/Assistance - 24 hour;Other (comment) (will need SNF if unable to go to CIR)    Equipment Recommendations  3 in 1 bedside commode;Tub/shower bench;Wheelchair (measurements OT);Wheelchair cushion (measurements OT);Other (comment) (bariatric, drop arm BSC)    Recommendations for Other Services Rehab consult    Precautions / Restrictions Precautions Precautions: Fall Required Braces or Orthoses: Other Brace Other Brace: L knee hinged brace locked in 5-10' flexion (ortho does not want knee extended fully) Restrictions Weight Bearing Restrictions: Yes LLE Weight Bearing: Non weight bearing       Mobility Bed Mobility Overal bed mobility: Needs Assistance Bed Mobility: Supine to Sit     Supine to sit: Min assist;HOB elevated Sit to supine: Min assist;HOB elevated   General bed mobility comments: Min A using gait belt as leg lifter and HOB slightly elevated. Heavy use of bed rail.   Transfers Overall transfer level: Needs assistance Equipment used: 2 person hand held assist;1 person  hand held assist Transfers: Sit to/from Stand;Lateral/Scoot Transfers;Squat Pivot Transfers Sit to Stand: Max assist;+2 physical assistance;+2 safety/equipment   Squat pivot transfers: Max assist;+2 physical assistance;+2 safety/equipment    Lateral/Scoot Transfers: Min guard General transfer comment: Attempted standing, but unable to achieve fully upright despite maxA+2; able to scoot along EOB with min guard, heavy use of BUE support; short increments of squat pivot transfer with single HHA (hooked elbows) and arm rest from bed>drop-arm BSC>drop-arm recliner with maxA+2    Balance Overall balance assessment: Needs assistance;History of Falls Sitting-balance support: No upper extremity supported;Feet supported Sitting balance-Leahy Scale: Good         Standing balance comment: Dependent for posterior pericare with partial standing, heavy reliance on BUE support                           ADL either performed or assessed with clinical judgement   ADL Overall ADL's : Needs assistance/impaired                     Lower Body Dressing: Total assistance;Sitting/lateral leans;Sit to/from stand Lower Body Dressing Details (indicate cue type and reason): Total A to don socks, may benefit from AE education during next session Toilet Transfer: Maximal assistance;+2 for physical assistance;+2 for safety/equipment;Squat-pivot;BSC (drop arm BSC) Toilet Transfer Details (indicate cue type and reason): Max A x 2 for squat pivot/scooting transfer to drop arm BSC, cueing for technique and pt with improving problem solving  Toileting- Clothing Manipulation and Hygiene: Total assistance;Sit to/from stand;Sitting/lateral lean;+2 for physical assistance;+2 for safety/equipment Toileting - Clothing Manipulation Details (indicate cue type and reason): Total A  for peri care after BM with slight squat stance. x 2 assist needed. Pt reports difficulty reaching and uses a bidet at home        General ADL Comments: Limited by pain but motivated to return to independence and attempt all tasks     Vision   Vision Assessment?: No apparent visual deficits   Perception     Praxis      Cognition Arousal/Alertness: Awake/alert Behavior During Therapy: WFL for tasks assessed/performed;Anxious Overall Cognitive Status: Within Functional Limits for tasks assessed                                 General Comments: Anxiety regarding pain with therapy, looks for feedback, implements safety techniques after cueing         Exercises     Shoulder Instructions       General Comments hinged knee brace readjusted as it was sliding off of L LE.     Pertinent Vitals/ Pain       Pain Assessment: Faces Faces Pain Scale: Hurts whole lot Pain Location: LLE with mobility Pain Descriptors / Indicators: Grimacing;Guarding;Moaning Pain Intervention(s): Limited activity within patient's tolerance;Monitored during session  Home Living                                          Prior Functioning/Environment              Frequency  Min 2X/week        Progress Toward Goals  OT Goals(current goals can now be found in the care plan section)  Progress towards OT goals: Progressing toward goals  Acute Rehab OT Goals Patient Stated Goal: be able to take care of self independently  OT Goal Formulation: With patient Time For Goal Achievement: 03/12/20 Potential to Achieve Goals: Good ADL Goals Pt Will Perform Lower Body Bathing: with min assist;sitting/lateral leans Pt Will Perform Lower Body Dressing: with min assist;sitting/lateral leans Pt Will Transfer to Toilet: with max assist;stand pivot transfer;bedside commode Pt Will Perform Toileting - Clothing Manipulation and hygiene: with min assist;sitting/lateral leans  Plan Discharge plan remains appropriate    Co-evaluation    PT/OT/SLP Co-Evaluation/Treatment: Yes Reason for Co-Treatment:  For patient/therapist safety;To address functional/ADL transfers   OT goals addressed during session: ADL's and self-care      AM-PAC OT "6 Clicks" Daily Activity     Outcome Measure   Help from another person eating meals?: None Help from another person taking care of personal grooming?: A Little Help from another person toileting, which includes using toliet, bedpan, or urinal?: Total Help from another person bathing (including washing, rinsing, drying)?: A Lot Help from another person to put on and taking off regular upper body clothing?: A Little Help from another person to put on and taking off regular lower body clothing?: Total 6 Click Score: 14    End of Session Equipment Utilized During Treatment: Gait belt;Other (comment) (knee brace)  OT Visit Diagnosis: Unsteadiness on feet (R26.81);Other abnormalities of gait and mobility (R26.89);Muscle weakness (generalized) (M62.81);History of falling (Z91.81);Pain Pain - Right/Left: Left Pain - part of body: Leg   Activity Tolerance Patient tolerated treatment well   Patient Left in chair;with call bell/phone within reach;with chair alarm set   Nurse Communication Mobility status;Need for lift equipment  Time: 4496-7591 OT Time Calculation (min): 37 min  Charges: OT General Charges $OT Visit: 1 Visit OT Treatments $Self Care/Home Management : 8-22 mins  Layla Maw, OTR/L   Layla Maw 02/28/2020, 3:16 PM

## 2020-02-28 NOTE — Plan of Care (Signed)
Patient is alert and oriented x4. Patient has pain to LLE, immobilizer in place. Scheduled toradol and tylenol effective at controlling pain. No other needs expressed. Problem: Education: Goal: Knowledge of General Education information will improve Description: Including pain rating scale, medication(s)/side effects and non-pharmacologic comfort measures Outcome: Progressing   Problem: Health Behavior/Discharge Planning: Goal: Ability to manage health-related needs will improve Outcome: Progressing   Problem: Clinical Measurements: Goal: Ability to maintain clinical measurements within normal limits will improve Outcome: Progressing Goal: Will remain free from infection Outcome: Progressing Goal: Diagnostic test results will improve Outcome: Progressing Goal: Respiratory complications will improve Outcome: Progressing Goal: Cardiovascular complication will be avoided Outcome: Progressing   Problem: Activity: Goal: Risk for activity intolerance will decrease Outcome: Progressing   Problem: Nutrition: Goal: Adequate nutrition will be maintained Outcome: Progressing   Problem: Coping: Goal: Level of anxiety will decrease Outcome: Progressing   Problem: Elimination: Goal: Will not experience complications related to bowel motility Outcome: Progressing Goal: Will not experience complications related to urinary retention Outcome: Progressing   Problem: Pain Managment: Goal: General experience of comfort will improve Outcome: Progressing   Problem: Safety: Goal: Ability to remain free from injury will improve Outcome: Progressing   Problem: Skin Integrity: Goal: Risk for impaired skin integrity will decrease Outcome: Progressing

## 2020-02-28 NOTE — Progress Notes (Signed)
Orthopaedic Trauma Progress Note  S: Pain much better today, slept well overnight. Feels motivated to work with therapies and is hopeful for CIR  O:  Vitals:   02/28/20 0501 02/28/20 0800  BP: (!) 156/77 (!) 151/66  Pulse: 70 78  Resp: 17 18  Temp: 99 F (37.2 C) 98.2 F (36.8 C)  SpO2: 97% 98%    General: Sitting up in bed, no acute distress Respiratory:  No increased work of breathing.  Left Lower Extremity: Bledsoe brace in place locked in 10 degrees of flexion. Dressing remove, incsion clean, dry, intact with steri-strips in place. Tenderness through lower leg as expected. Swelling appropriate.  Sensation intact distally. Able to wiggle toes. Foot warm and well-perfused. Compartments of compressible. Otherwise neurovascularly intact  Imaging: Stable post op imaging.  Labs:  Results for orders placed or performed during the hospital encounter of 02/24/20 (from the past 24 hour(s))  Basic metabolic panel     Status: Abnormal   Collection Time: 02/27/20  5:29 PM  Result Value Ref Range   Sodium 130 (L) 135 - 145 mmol/L   Potassium 4.5 3.5 - 5.1 mmol/L   Chloride 94 (L) 98 - 111 mmol/L   CO2 29 22 - 32 mmol/L   Glucose, Bld 160 (H) 70 - 99 mg/dL   BUN 7 (L) 8 - 23 mg/dL   Creatinine, Ser 0.63 0.44 - 1.00 mg/dL   Calcium 8.6 (L) 8.9 - 10.3 mg/dL   GFR, Estimated >60 >60 mL/min   Anion gap 7 5 - 15  Basic metabolic panel     Status: Abnormal   Collection Time: 02/28/20  2:33 AM  Result Value Ref Range   Sodium 135 135 - 145 mmol/L   Potassium 4.5 3.5 - 5.1 mmol/L   Chloride 97 (L) 98 - 111 mmol/L   CO2 29 22 - 32 mmol/L   Glucose, Bld 99 70 - 99 mg/dL   BUN 8 8 - 23 mg/dL   Creatinine, Ser 0.52 0.44 - 1.00 mg/dL   Calcium 8.6 (L) 8.9 - 10.3 mg/dL   GFR, Estimated >60 >60 mL/min   Anion gap 9 5 - 15  CBC     Status: Abnormal   Collection Time: 02/28/20  2:33 AM  Result Value Ref Range   WBC 7.2 4.0 - 10.5 K/uL   RBC 3.46 (L) 3.87 - 5.11 MIL/uL   Hemoglobin 10.9 (L)  12.0 - 15.0 g/dL   HCT 33.0 (L) 36 - 46 %   MCV 95.4 80.0 - 100.0 fL   MCH 31.5 26.0 - 34.0 pg   MCHC 33.0 30.0 - 36.0 g/dL   RDW 13.3 11.5 - 15.5 %   Platelets 247 150 - 400 K/uL   nRBC 0.0 0.0 - 0.2 %  Glucose, capillary     Status: Abnormal   Collection Time: 02/28/20  6:54 AM  Result Value Ref Range   Glucose-Capillary 119 (H) 70 - 99 mg/dL    Assessment: 64 year old female status post fall, 2 Days Post-Op   Injuries:1.  Left medial tibial plateau fracture s/p ORIF 2. Left PCL/LCL injury 3. Left knee posterior capsular disruption  Weightbearing: NWB LLE  Insicional and dressing care: Ok to leave incision open to air Showering: Okay to begin showering on 02/29/2020 with assistance  Orthopedic device(s): Bledsoe brace left knee, locked in 10 degrees flexion   CV/Blood loss: Acute blood loss anemia, Hgb 10.9 this morning. Hemodynamically stable  Pain management:  1. Tylenol 650 mg q  6 hours scheduled 2. Robaxin 500 mg q 6 hours PRN 3. Oxycodone 5-10 mg q 4 hours PRN 4. Neurontin 300 mg TID 5. Dilaudid 0.5-1 mg q 3 hours PRN 6. Toradol 15 mg q 6 hours x 5 doses  VTE prophylaxis: Lovenox, SCD RLE  ID:  Ancef 2gm post op  Foley/Lines:  No foley, KVO IVFs  Medical co-morbidities: morbid obesity, bipolar disorder, hypertension  Impediments to Fracture Healing: Vit D level 18, continue home dosevitamin D supplementation  Dispo: Therapies as tolerated. PT/OT recommending CIR. Consult has been placed for this.  Patient ok for d/c to next venue from ortho standpoint once bed available  Follow - up plan: Will continue to follow while in hospital and plan for outpateint follow-up  2 weeks after discharge for repeat x-rays  Contact information:  Katha Hamming MD, Patrecia Pace PA-C   [facility] [phone] (office) [email or web address]

## 2020-02-28 NOTE — Progress Notes (Signed)
Subjective:  [name] is a 64 year old female with past medical history of morbid obesity, bipolar disorder, prior left femur fracture, alcohol use disorder and hypertension (on HCTZ) who presented to [facility] on the evening of 11/06 after a fall resulting in a left tibial plateau fracture, LCL injury, and fibular fracture with a laboratory evidence of hyponatremia (124) s/p ORIF on 28/78 without complication.  Overnight, no acute events.  This morning, patient states that she is feeling much improved from yesterday. She reports sleeping well overnight. She has not gotten out of bed today, but looks forward to working with her physical and occupational therapists. Patient is concerned about brittle bone because her mother has been diagnosed with this condition. Patient states that she has scheduled a Telehealth visit with her PCP next Monday.   Objective:  Vital signs in last 24 hours: Vitals:   02/27/20 1640 02/27/20 2040 02/28/20 0501 02/28/20 0800  BP: (!) 150/72 (!) 158/78 (!) 156/77 (!) 151/66  Pulse: 85 69 70 78  Resp: 18 16 17 18   Temp: 98.1 F (36.7 C) 98.5 F (36.9 C) 99 F (37.2 C) 98.2 F (36.8 C)  TempSrc: Oral Oral Oral   SpO2: 100% 98% 97% 98%  Weight:      Height:      On room air  Intake/Output Summary (Last 24 hours) at 02/28/2020 1245 Last data filed at 02/28/2020 0900 Gross per 24 hour  Intake 940 ml  Output 5000 ml  Net -4060 ml   Filed Weights   02/26/20 1806  Weight: 107.2 kg   Physical Exam Vitals and nursing note reviewed. Exam conducted with a chaperone present.  Constitutional:      General: She is not in acute distress.    Appearance: She is obese.  HENT:     Head: Normocephalic and atraumatic.  Eyes:     Extraocular Movements: Extraocular movements intact.     Conjunctiva/sclera: Conjunctivae normal.  Cardiovascular:     Rate and Rhythm: Normal rate and regular rhythm.     Pulses: Normal pulses.     Heart sounds: Normal heart  sounds.  Pulmonary:     Breath sounds: Normal breath sounds.  Abdominal:     General: Abdomen is flat. Bowel sounds are normal.     Palpations: Abdomen is soft.     Tenderness: There is no abdominal tenderness.  Musculoskeletal:     Cervical back: Normal range of motion and neck supple.     Comments: Patient's left lower extremity wrapped and in knee brace.  Skin:    General: Skin is warm and dry.     Capillary Refill: Capillary refill takes less than 2 seconds.  Neurological:     General: No focal deficit present.     Mental Status: She is alert. Mental status is at baseline.     Comments: Patient reports intact sensation in left lower extremity and toes.  Psychiatric:        Mood and Affect: Mood normal.        Behavior: Behavior normal.        Thought Content: Thought content normal.        Judgment: Judgment normal.    LABS: CBC Latest Ref Rng & Units 02/28/2020 02/27/2020 02/26/2020  WBC 4.0 - 10.5 K/uL 7.2 9.0 7.3  Hemoglobin 12.0 - 15.0 g/dL 10.9(L) 11.4(L) 11.8(L)  Hematocrit 36 - 46 % 33.0(L) 35.2(L) 36.2  Platelets 150 - 400 K/uL 247 241 270   BMP Latest  Ref Rng & Units 02/28/2020 02/27/2020 02/27/2020  Glucose 70 - 99 mg/dL 99 160(H) 133(H)  BUN 8 - 23 mg/dL 8 7(L) <5(L)  Creatinine 0.44 - 1.00 mg/dL 0.52 0.63 0.48  Sodium 135 - 145 mmol/L 135 130(L) 134(L)  Potassium 3.5 - 5.1 mmol/L 4.5 4.5 4.2  Chloride 98 - 111 mmol/L 97(L) 94(L) 94(L)  CO2 22 - 32 mmol/L 29 29 32  Calcium 8.9 - 10.3 mg/dL 8.6(L) 8.6(L) 8.7(L)   25-OH Vitamin D - 18.03 Parathyroid hormone - 40  IMAGING: DG Tibia/Fibula Left  Result Date: 02/26/2020 CLINICAL DATA:  ORIF tibial plateau fracture. EXAM: LEFT TIBIA AND FIBULA - 2 VIEW; DG C-ARM 1-60 MIN FLUOROSCOPY TIME:  1 minute, 21 seconds (7.9 mGy) COMPARISON:  Left knee radiographs-02/25/2020; left knee CT-02/25/2020 FINDINGS: Seven spot intraoperative fluoroscopic images of the left knee are provided for review and demonstrate the C quell a  of sideplate fixation of previously identified impacted medial tibial plateau fracture. Alignment appears near anatomic. Note is also made of the distal end of a intramedullary femoral rod, incompletely evaluated. No radiopaque foreign body. IMPRESSION: Post sideplate fixation of medial tibial plateau fracture without evidence of complication. Electronically Signed   By: Sandi Mariscal M.D.   On: 02/26/2020 13:51   CT Knee Left Wo Contrast  Result Date: 02/25/2020 CLINICAL DATA:  Tibial plateau fracture. EXAM: CT OF THE LEFT KNEE WITHOUT CONTRAST TECHNIQUE: Multidetector CT imaging of the left knee was performed according to the standard protocol. Multiplanar CT image reconstructions were also generated. COMPARISON:  Left knee radiographs, earlier today FINDINGS: Bones/Joint/Cartilage There is an acute, comminuted, intra-articular fracture involving the medial tibial plateau. There is mild depression of the anterior fracture fragments by approximately 5 mm. Signs of lateral collateral ligament avulsion injury is also noted with small osseous fragments noted adjacent to the lateral femoral condyle and lateral tibial plateau, image 46/6 and image 51/6. Additionally, nondisplaced fracture of the styloid process of the fibula is suspected which may be related to lateral collateral ligament injury and/or arcuate ligament avulsion fracture., image 81/3. IM nail and screws are noted within the distal femoral diaphysis. Ligaments Suboptimally assessed by CT. Muscles and Tendons Unremarkable. Soft tissues Small suprapatellar joint effusion. Diffuse soft tissue stranding is identified within the subcutaneous fat surrounding the left knee. Small radio dense foreign body is noted within the subcutaneous soft tissues anterior to the proximal tibia measures 2 mm. IMPRESSION: 1. Acute, comminuted, intra-articular fracture involving the medial tibial plateau with mild depression of the anterior fracture fragments. 2. Signs of  lateral collateral ligament avulsion injury is noted with small osseous fragments adjacent to the lateral femoral condyle and lateral tibial plateau. 3. Nondisplaced fracture of the styloid process of the fibula is suspected which may be related to lateral collateral ligament injury and/or arcuate ligament avulsion fracture. 4. Small suprapatellar joint effusion. 5. Small radio dense foreign body is noted within the subcutaneous soft tissues anterior to the proximal tibia. Electronically Signed   By: Kerby Moors M.D.   On: 02/25/2020 05:26   DG Knee Complete 4 Views Left  Result Date: 02/25/2020 CLINICAL DATA:  Pain EXAM: LEFT KNEE - COMPLETE 4+ VIEW COMPARISON:  None. FINDINGS: There is an acute, depressed medial tibial plateau fracture. There is a small suprapatellar joint effusion. Mild tricompartmental degenerative changes are noted. There is soft tissue swelling about the knee. The patient has undergone prior intramedullary nail placement through the femur. The second most distal interlocking screw is bent. IMPRESSION:  1. Acute, depressed medial tibial plateau fracture. 2. Mild tricompartmental degenerative changes are noted. Electronically Signed   By: Constance Holster M.D.   On: 02/25/2020 03:02   DG Knee Left Port  Result Date: 02/26/2020 CLINICAL DATA:  64 year old female postoperative films following ORIF. EXAM: PORTABLE LEFT KNEE - 1-2 VIEW COMPARISON:  Intraoperative images 12 45 hours today. FINDINGS: AP and cross-table lateral views. Pre-existing distal left femur intramedullary rod and distal interlocking cortical screws. New anterior/medial proximal tibia plate and screws. No adverse hardware features. No new osseous abnormality identified. IMPRESSION: Proximal left tibia ORIF with no adverse features. Electronically Signed   By: Genevie Ann M.D.   On: 02/26/2020 18:13   DG C-Arm 1-60 Min  Result Date: 02/26/2020 CLINICAL DATA:  ORIF tibial plateau fracture. EXAM: LEFT TIBIA AND FIBULA -  2 VIEW; DG C-ARM 1-60 MIN FLUOROSCOPY TIME:  1 minute, 21 seconds (7.9 mGy) COMPARISON:  Left knee radiographs-02/25/2020; left knee CT-02/25/2020 FINDINGS: Seven spot intraoperative fluoroscopic images of the left knee are provided for review and demonstrate the C quell a of sideplate fixation of previously identified impacted medial tibial plateau fracture. Alignment appears near anatomic. Note is also made of the distal end of a intramedullary femoral rod, incompletely evaluated. No radiopaque foreign body. IMPRESSION: Post sideplate fixation of medial tibial plateau fracture without evidence of complication. Electronically Signed   By: Sandi Mariscal M.D.   On: 02/26/2020 13:51   Assessment/Plan:  Principal Problem:   Tibial plateau fracture, left Active Problems:   Tibial plateau fracture   Hyponatremia   Alcohol use disorder, moderate, dependence ([place])   Essential hypertension  Ms. Kali Deadwyler is a 64 year old female with past medical history of morbid obesity, bipolar disorder, prior left femur fracture, alcohol use disorder and hypertension (on HCTZ) who presented to [facility] on the evening of 11/06 after a fall resulting in a left tibial plateau fracture, LCL injury, and fibular fracture with a laboratory evidence of hyponatremia (124) s/p ORIF on 02/72 without complication.  #Left medial tibial plateau fracture, PCL/LCL injury, knee posterior capsular disruption s/p ORIF Patient presented with fractures with ligament injury following fall. Patient underwent orthopedic surgery on 53/09/6438 without complications. Patient doing well on current pain medication regimen.  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 HCTZ was discontinued and sodium most recently 135 this morning. -Daily BMP -Discontinued HCTZ  #HTN, chronic Patient mildly hypertensive to 140s-150s following discontinuation of HCTZ. -Discontinued HCTZ in setting of hyponatremia -Started amlodipine 5mg  this morning -Plan to follow-up with PCP for continued management  #Vitamin D deficiency, active Patient's 25-OH vitamin D level 18.03 with normal PTH level. -Start on vitamin D supplementation (50,000 units weekly) for 6-8 weeks -Following loading, switch to daily supplementation  #Alcohol Use Disorder, chronic Reports drinking approximately a 6-pack of beer three to four times a week. -Placed on CIWA  #VTE ppx: Lovenox #Diet: Heart healthy #Code status: Full code #Bowel regimen: Senna twice daily #PT/OT recs: CIR, awaiting admission  Cato Mulligan, MD 02/28/2020, 12:45 PM Pager: [phone] After 5pm on weekdays and 1pm on weekends: On Call pager [phone]

## 2020-02-28 NOTE — Progress Notes (Signed)
Physical Therapy Treatment Patient Details Name: [name] MRN: [id number] DOB: Jul 18, 1955 Today's Date: 02/28/2020    History of Present Illness Pt is a 64 y.o. female admitted 02/24/20 after fall sustaining L tibial plateau fx with lateral collateral ligament avulsion injury. S/p L tibial ORIF on 02/26/20. PMH includes L femur IMN, HTN, obesity, bipolar disorder, ETOH use.   PT Comments    Pt extremely motivated for OOB mobility this session, with goal being to sit on BSC in order to finally have BM. Pt able to perform multiple lateral scoots and squat pivot transfers to drop-arm BSC and drop-arm recliner, requiring up to maxA+2. Remains limited by LLE pain, generalized weakness, and decreased activity tolerance. Continue to recommend intensive CIR-level therapies to maximize functional mobility and independence prior to return home.    Follow Up Recommendations  CIR;Supervision for mobility/OOB     Equipment Recommendations  Wheelchair (measurements PT);Wheelchair cushion (measurements PT);Other (comment) (bariatric rolling walker, bariatric 3in1 with drop-arm)    Recommendations for Other Services       Precautions / Restrictions Precautions Precautions: Fall Required Braces or Orthoses: Other Brace Other Brace: L knee hinged brace locked in 5-10' flexion (ortho does not want knee extended fully) Restrictions Weight Bearing Restrictions: Yes LLE Weight Bearing: Non weight bearing    Mobility  Bed Mobility Overal bed mobility: Needs Assistance Bed Mobility: Supine to Sit     Supine to sit: Min assist;HOB elevated     General bed mobility comments: Min A using gait belt as leg lifter and HOB slightly elevated. Heavy use of bed rail. Cued to avoid pulling too hard on leg lifter, use only to lightly lift to advance LE  Transfers Overall transfer level: Needs assistance Equipment used: 2 person hand held assist;1 person hand held assist Transfers: Sit to/from  Stand;Lateral/Scoot Transfers;Squat Pivot Transfers Sit to Stand: Max assist;+2 physical assistance;+2 safety/equipment   Squat pivot transfers: Max assist;+2 physical assistance;+2 safety/equipment    Lateral/Scoot Transfers: Min guard General transfer comment: Attempted standing, but unable to achieve fully upright despite maxA+2; able to scoot along EOB with min guard, heavy use of BUE support; short increments of squat pivot transfer with single HHA (hooked elbows) and arm rest from bed>drop-arm BSC>drop-arm recliner with maxA+2  Ambulation/Gait                 Stairs             Wheelchair Mobility    Modified Rankin (Stroke Patients Only)       Balance Overall balance assessment: Needs assistance;History of Falls Sitting-balance support: No upper extremity supported;Feet supported Sitting balance-Leahy Scale: Good         Standing balance comment: Dependent for posterior pericare with partial standing, heavy reliance on BUE support                            Cognition Arousal/Alertness: Awake/alert Behavior During Therapy: WFL for tasks assessed/performed;Anxious Overall Cognitive Status: Within Functional Limits for tasks assessed                                 General Comments: Anxiety regarding pain with therapy, looks for feedback, implements safety techniques after cueing       Exercises      General Comments General comments (skin integrity, edema, etc.): Pt extremely motivated to participate and regain PLOF. Bledsoe brace readjusted as  it was sliding off leg      Pertinent Vitals/Pain Pain Assessment: Faces Faces Pain Scale: Hurts whole lot Pain Location: LLE with mobility Pain Descriptors / Indicators: Grimacing;Guarding;Moaning Pain Intervention(s): Monitored during session;Repositioned;Premedicated before session    Home Living                      Prior Function            PT Goals (current  goals can now be found in the care plan section) Progress towards PT goals: Progressing toward goals    Frequency    Min 5X/week      PT Plan Current plan remains appropriate    Co-evaluation              AM-PAC PT "6 Clicks" Mobility   Outcome Measure  Help needed turning from your back to your side while in a flat bed without using bedrails?: A Little Help needed moving from lying on your back to sitting on the side of a flat bed without using bedrails?: A Lot Help needed moving to and from a bed to a chair (including a wheelchair)?: Total Help needed standing up from a chair using your arms (e.g., wheelchair or bedside chair)?: Total Help needed to walk in hospital room?: Total Help needed climbing 3-5 steps with a railing? : Total 6 Click Score: 9    End of Session Equipment Utilized During Treatment: Gait belt Activity Tolerance: Patient tolerated treatment well Patient left: in chair;with call bell/phone within reach;with chair alarm set Nurse Communication: Mobility status;Need for lift equipment PT Visit Diagnosis: Other abnormalities of gait and mobility (R26.89);Pain;Muscle weakness (generalized) (M62.81) Pain - Right/Left: Left Pain - part of body: Leg;Knee     Time: 8182-9937 PT Time Calculation (min) (ACUTE ONLY): 33 min  Charges:  $Therapeutic Activity: 8-22 mins                    Mabeline Caras, PT, DPT Acute Rehabilitation Services  Pager [phone] Office [place] 02/28/2020, 3:07 PM

## 2020-02-29 LAB — BASIC METABOLIC PANEL
Anion gap: 7 (ref 5–15)
BUN: 6 mg/dL — ABNORMAL LOW (ref 8–23)
CO2: 28 mmol/L (ref 22–32)
Calcium: 8.7 mg/dL — ABNORMAL LOW (ref 8.9–10.3)
Chloride: 101 mmol/L (ref 98–111)
Creatinine, Ser: 0.52 mg/dL (ref 0.44–1.00)
GFR, Estimated: >60 mL/min (ref >60)
Glucose, Bld: 110 mg/dL — ABNORMAL HIGH (ref 70–99)
Potassium: 4.3 mmol/L (ref 3.5–5.1)
Sodium: 136 mmol/L (ref 135–145)

## 2020-02-29 LAB — CBC
HCT: 33.1 % — ABNORMAL LOW (ref 36.0–46.0)
Hemoglobin: 10.9 g/dL — ABNORMAL LOW (ref 12.0–15.0)
MCH: 31 pg (ref 26.0–34.0)
MCHC: 32.9 g/dL (ref 30.0–36.0)
MCV: 94 fL (ref 80.0–100.0)
Platelets: 243 10*3/uL (ref 150–400)
RBC: 3.52 MIL/uL — ABNORMAL LOW (ref 3.87–5.11)
RDW: 13.1 % (ref 11.5–15.5)
WBC: 7 10*3/uL (ref 4.0–10.5)
nRBC: 0 % (ref 0.0–0.2)

## 2020-02-29 LAB — GLUCOSE, CAPILLARY
Glucose-Capillary: 110 mg/dL — ABNORMAL HIGH (ref 70–99)
Glucose-Capillary: 110 mg/dL — ABNORMAL HIGH (ref 70–99)

## 2020-02-29 MED ORDER — DIPHENHYDRAMINE HCL 25 MG PO CAPS
25.0000 mg | ORAL_CAPSULE | Freq: Four times a day (QID) | ORAL | Status: DC | PRN
  Administered 2020-03-01: 25 mg via ORAL
  Filled 2020-02-29: qty 1

## 2020-02-29 NOTE — PMR Pre-admission (Signed)
PMR Admission Coordinator Pre-Admission Assessment  [name] is an 64 y.o., female MRN: [id number] DOB: October 11, 1955 Height: 5' 8"  (172.7 cm) Weight: 107.2 kg  Insurance Information HMO:     PPO: yes     PCP:      IPA:      80/20:      OTHER:  PRIMARY: BCBS       Policy#: [id number]      Subscriber: patient CM Name: Amy via fax      Phone#: [phone]     Fax#: [phone] Pre-Cert#: [id number]      Employer:  [employer]aufmann provided by Amy via fax for admit to CIR. Effective dates of authorization are: 02/29/20 with clinical update required by 03/18/20 to the above fax number [phone]) Benefits:  Phone #: [phone]     Name:  Eff. Date: 01/15/2020-04/19/2020     Deduct: $2,000 ($0 met)      Out of Pocket Max: $4,000 ($121.47 met)      Life Max: NA CIR: $0/admission co-pay , then, 90% coverage, 10% co-insurance      SNF: 90% coverage, 10% co-insurance, no co-pay; limit 60 days Outpatient: $70 per visit, 0% co-insurance, limit 30 visits    Home Health: 90% coverage, 10% co-insurance, no co-pay       DME: 90% coverage, 10% co-insurance, no co-pay     Providers:  SECONDARY: None      Policy#:      Phone#:   Development worker, community:       Phone#:   The Therapist, art Information Summary" for patients in Inpatient Rehabilitation Facilities with attached "Privacy Act Ewa [place]entry Records" was provided and verbally reviewed with: N/A  Emergency Contact Information Contact Information    Name Relation Home Work [place] Spouse [phone]  [phone]      Current Medical History  Patient Admitting Diagnosis: fall resulting in left tibial plateau fracture, LCL injury, and fibular fracture s/p ORIF  History of Present Illness: [name] is a 64 year old right-handed female with history of morbid obesity BMI 35.93, bipolar disorder, hypertension as well as tobacco/alcohol use, left femur fracture 2 years ago that required surgical intervention.  Per  chart review patient lives with spouse.  1 level home ramped entrance.  Independent with assistive device.  Patient able to complete ADLs modified independent.  Presented 02/25/2020 after a fall when her knee gave out from under her.  No loss of consciousness.  Admission chemistries hemoglobin 13.1, sodium 124, glucose 123.  X-rays and imaging revealed left medial tibial plateau fracture.  Underwent ORIF of left tibial plateau fracture 02/26/2020 per Dr. Doreatha Martin..  Placed on Lovenox for DVT prophylaxis.  Nonweightbearing left lower extremity with left hinged knee brace locked in 5-10 degree flexion.  Acute blood loss anemia 10.9 and monitored.  Therapy evaluations completed with recommendations of inpatient rehab services. Pt is to admit to CIR on 03/01/20.     Patient's medical record from [facility] has been reviewed by the rehabilitation admission coordinator and physician.  Past Medical History  History reviewed. No pertinent past medical history.  Family History   family history is not on file.  Prior Rehab/Hospitalizations Has the patient had prior rehab or hospitalizations prior to admission? No  Has the patient had major surgery during 100 days prior to admission? Yes   Current Medications  Current Facility-Administered Medications:  .  acetaminophen (TYLENOL) tablet 650 mg, 650 mg, Oral, Q6H, 650 mg at 03/01/20 0935 **OR** acetaminophen (  TYLENOL) suppository 650 mg, 650 mg, Rectal, Q6H, Ricci Barker, Sarah A, PA-C .  amLODipine (NORVASC) tablet 5 mg, 5 mg, Oral, Daily, Gaylan Gerold, DO, 5 mg at 03/01/20 0935 .  diphenhydrAMINE (BENADRYL) capsule 25 mg, 25 mg, Oral, Q6H PRN, Cato Mulligan, MD, 25 mg at 03/01/20 0000 .  enoxaparin (LOVENOX) injection 40 mg, 40 mg, Subcutaneous, Q24H, Delray Alt, PA-C, 40 mg at 03/01/20 0934 .  gabapentin (NEURONTIN) capsule 300 mg, 300 mg, Oral, TID, Patrecia Pace A, PA-C, 300 mg at 03/01/20 0935 .  lactated ringers infusion, ,  Intravenous, Continuous, Delray Alt, PA-C, Last Rate: 10 mL/hr at 02/26/20 1104, New Bag at 02/26/20 1331 .  methocarbamol (ROBAXIN) tablet 500 mg, 500 mg, Oral, Q6H PRN, Delray Alt, PA-C, 500 mg at 02/29/20 0820 .  oxyCODONE (Oxy IR/ROXICODONE) immediate release tablet 5-10 mg, 5-10 mg, Oral, Q4H PRN, Patrecia Pace A, PA-C, 10 mg at 03/01/20 0254 .  senna (SENOKOT) tablet 8.6 mg, 1 tablet, Oral, BID, Patrecia Pace A, PA-C, 8.6 mg at 03/01/20 0935 .  Vitamin D (Ergocalciferol) (DRISDOL) capsule 50,000 Units, 50,000 Units, Oral, Q7 days, Vivien Rota, 50,000 Units at 02/26/20 2258  Patients Current Diet:  Diet Order            Diet Heart Room service appropriate? Yes; Fluid consistency: Thin  Diet effective now                 Precautions / Restrictions Precautions Precautions: Fall Other Brace: L knee hinged brace locked in 5-10' flexion (ortho does not want knee extended fully) Restrictions Weight Bearing Restrictions: Yes LLE Weight Bearing: Touchdown weight bearing   Has the patient had 2 or more falls or a fall with injury in the past year? Yes  Prior Activity Level Limited Community (1-2x/wk): limited due to LLE injuries in the past; limited mobility; used electric scooter in grocery store and RW vs cane in house.   Prior Functional Level Self Care: Did the patient need help bathing, dressing, using the toilet or eating? Independent  Indoor Mobility: Did the patient need assistance with walking from room to room (with or without device)? Independent  Stairs: Did the patient need assistance with internal or external stairs (with or without device)? Dependent  Functional Cognition: Did the patient need help planning regular tasks such as shopping or remembering to take medications? Independent  Home Assistive Devices / Equipment Home Assistive Devices/Equipment: None Home Equipment: Walker - 2 wheels, Hand held shower head, Adaptive equipment, Cane -  quad  Prior Device Use: Indicate devices/aids used by the patient prior to current illness, exacerbation or injury? Motorized wheelchair or scooter, Designer, television/film set  Overall Cognitive Status: Within Functional Limits for tasks assessed Orientation Level: Oriented X4 General Comments: Anxiety regarding pain with therapy, looks for feedback, implements safety techniques after cueing     Extremity Assessment (includes Sensation/Coordination)  Upper Extremity Assessment: Overall WFL for tasks assessed  Lower Extremity Assessment: Defer to PT evaluation LLE Deficits / Details: s/p L tibial ORIF locked in 10' flex hinged brace; hip functionally <3/5, likely limited by post-op pain and weakness LLE: Unable to fully assess due to pain, Unable to fully assess due to immobilization LLE Coordination: decreased gross motor, decreased fine motor    ADLs  Overall ADL's : Needs assistance/impaired Eating/Feeding: Independent, Sitting Grooming: Set up, Sitting Upper Body Bathing: Minimal assistance, Sitting Lower Body Bathing: Maximal assistance, Sitting/lateral leans Upper Body Dressing :  Set up, Sitting Lower Body Dressing: Total assistance, Sitting/lateral leans, Sit to/from stand Lower Body Dressing Details (indicate cue type and reason): Total A to don socks, may benefit from AE education during next session Toilet Transfer: Maximal assistance, +2 for physical assistance, +2 for safety/equipment, Squat-pivot, BSC (drop arm BSC) Toilet Transfer Details (indicate cue type and reason): Max A x 2 for squat pivot/scooting transfer to drop arm BSC, cueing for technique and pt with improving problem solving  Toileting- Clothing Manipulation and Hygiene: Total assistance, Sit to/from stand, Sitting/lateral lean, +2 for physical assistance, +2 for safety/equipment Toileting - Clothing Manipulation Details (indicate cue type and reason): Total A for peri care after BM  with slight squat stance. x 2 assist needed. Pt reports difficulty reaching and uses a bidet at home General ADL Comments: Limited by pain but motivated to return to independence and attempt all tasks    Mobility  Overal bed mobility: Needs Assistance Bed Mobility: Supine to Sit, Sit to Supine Supine to sit: Min assist, HOB elevated Sit to supine: Mod assist General bed mobility comments: Min A using gait belt as leg lifter and HOB slightly elevated. Heavy use of bed rail.   Pt required moderate assistance to manage LLE to move back into bed.  Pt placed in trendelenberg position to boost to head of bed. Increased time to position.    Transfers  Overall transfer level: Needs assistance Equipment used: 1 person hand held assist (assisted L side and leaning to the R with forearm supported on back of recliner.) Transfers: Sit to/from Stand Sit to Stand: Max assist, +2 physical assistance, +2 safety/equipment Squat pivot transfers: Mod assist, +2 safety/equipment  Lateral/Scoot Transfers: Min guard General transfer comment: Pt able to stand twice from elevate surface of bed.  Assistance to boost into standing with use of back of recliner on R side to pull and support weight on R forarm.  Pt stood for 10-20 sec each trial.    Ambulation / Gait / Stairs / Wheelchair Mobility  Ambulation/Gait Ambulation/Gait assistance:  (NT)    Posture / Balance Balance Overall balance assessment: Needs assistance, History of Falls Sitting-balance support: No upper extremity supported, Feet supported Sitting balance-Leahy Scale: Good Standing balance-Leahy Scale: Poor Standing balance comment: Dependent for posterior pericare with full standing. heavy reliance on RUE support on back of recliner.    Special needs/care consideration Continuous Drip IV  : lactated ringers infusion,  Skin: excoriated skin: abdomen; surgical incision to knee      Previous Home Environment (from acute therapy  documentation) Living Arrangements: Spouse/significant other Available Help at Discharge: Family Type of Home: House Home Layout: One level Home Access: Ramped entrance Bathroom Shower/Tub: Chiropodist: Standard (low toilet) Bathroom Accessibility: Yes How Accessible: Accessible via walker Marshall: No Additional Comments: Ramp being set-up today  Discharge Living Setting Plans for Discharge Living Setting: House (lives with husband) Type of Home at Discharge: House Discharge Home Layout: One level Discharge Home Access: Ramped entrance (vs one step to enter in to garage (no rails)) Discharge Bathroom Shower/Tub: Tub/shower unit Discharge Bathroom Toilet: Standard Discharge Bathroom Accessibility: Yes How Accessible: Accessible via walker Does the patient have any problems obtaining your medications?: No  Social/Family/Support Systems Patient Roles: Spouse Contact Information: [name] (husband): [phone] Anticipated Caregiver: husband  Anticipated Caregiver's Contact Information: see above Ability/Limitations of Caregiver: Min A Caregiver Availability: 24/7 Discharge Plan Discussed with Primary Caregiver: Yes Is Caregiver In Agreement with Plan?: Yes Does Caregiver/Family have Issues  with Lodging/Transportation while Pt is in Rehab?: No  Goals Patient/Family Goal for Rehab: PT/OT: Min A; SLP: NA Expected length of stay: 12-16 days Pt/Family Agrees to Admission and willing to participate: Yes Program Orientation Provided & Reviewed with Pt/Caregiver Including Roles  & Responsibilities: Yes (pt and her husband)  Barriers to Discharge: Weight bearing restrictions  Decrease burden of Care through IP rehab admission: Other NA  Possible need for SNF placement upon discharge: Not anticipated; pt has good support at DC and an accessible home (ramped entrance).   Patient Condition: I have reviewed medical records from [facility],  spoken with MD, RN, [facility] Team, and patient and spouse. I met with patient at the bedside for inpatient rehabilitation assessment.  Patient will benefit from ongoing PT and OT, can actively participate in 3 hours of therapy a day 5 days of the week, and can make measurable gains during the admission.  Patient will also benefit from the coordinated team approach during an Inpatient Acute Rehabilitation admission.  The patient will receive intensive therapy as well as Rehabilitation physician, nursing, social worker, and care management interventions.  Due to safety, skin/wound care, disease management, medication administration, pain management and patient education the patient requires 24 hour a day rehabilitation nursing.  The patient is currently Mod A +2 for transfers, no gait, and Max +2 to total A for basic ADLs.  Discharge setting and therapy post discharge at home with home health is anticipated.  Patient has agreed to participate in the Acute Inpatient Rehabilitation Program and will admit 03/01/20.  Preadmission Screen Completed By:  Raechel Ache, 03/01/2020 1:11 PM ______________________________________________________________________   Discussed status with Dr. Posey Pronto on 03/01/20 at 1:11PM and received approval for admission today 03/01/20.  Admission Coordinator:  Raechel Ache, OT, time 1:11PM/Date 03/01/20   Assessment/Plan: Diagnosis: left tibial plateau fracture, LCL injury, and fibular fracture s/p ORIF  1. Does the need for close, 24 hr/day Medical supervision in concert with the patient's rehab needs make it unreasonable for this patient to be served in a less intensive setting? Yes  2. Co-Morbidities requiring supervision/potential complications: morbid obesity (encourage weight loss), bipolar disorder, HTN (monitor and provide prns in accordance with increased physical exertion and pain), as well as tobacco/alcohol use,  3. Due to bladder management, bowel management, safety, skin/wound  care, disease management, pain management and patient education, does the patient require 24 hr/day rehab nursing? Yes 4. Does the patient require coordinated care of a physician, rehab nurse, PT, OT to address physical and functional deficits in the context of the above medical diagnosis(es)? Yes Addressing deficits in the following areas: balance, endurance, locomotion, strength, transferring, bathing, dressing, toileting and psychosocial support 5. Can the patient actively participate in an intensive therapy program of at least 3 hrs of therapy 5 days a week? Yes 6. The potential for patient to make measurable gains while on inpatient rehab is excellent 7. Anticipated functional outcomes upon discharge from inpatient rehab: supervision and min assist PT, supervision and min assist OT, n/a SLP 8. Estimated rehab length of stay to reach the above functional goals is: 12-15 days. 9. Anticipated discharge destination: Home 10. Overall Rehab/Functional Prognosis: good   MD Signature: Delice Lesch, MD, ABPMR

## 2020-02-29 NOTE — Progress Notes (Signed)
Inpatient Rehabilitation-Admissions Coordinator   Received insurance approval for admit to CIR. Unfortunately I do not have a bed available for this patient today. Will follow until bed becomes available.   Raechel Ache, OTR/L  Rehab Admissions Coordinator  [phone] 02/29/2020 2:30 PM

## 2020-02-29 NOTE — H&P (Signed)
Physical Medicine and Rehabilitation Admission H&P    Chief Complaint  Patient presents with  . Leg Injury  : HPI: [name] is a 64 year old right-handed female with history of morbid obesity BMI 35.93, bipolar disorder, hypertension as well as tobacco/alcohol use, left femur fracture 2 years ago that required surgical intervention.  History taken from chart review and patient.  Patient lives with spouse.  1 level home ramped entrance.  Independent with assistive device.  Patient able to complete ADLs modified independent.  She presented on 02/25/2020 after fall, when her knee gave out from under her.  Denies LOC.  Admission chemistries hemoglobin 13.1, sodium 124, glucose 123.  X-rays and imaging revealed left medial tibial plateau fracture.  Underwent ORIF of left tibial plateau fracture on 02/26/2020 per Dr. Doreatha Martin..  Placed on Lovenox for DVT prophylaxis.  Nonweightbearing left lower extremity with left hinged knee brace locked in 5-10 degree flexion.  Hospital course further complicated by acute blood loss anemia, 10.9 and monitored.  Therapy evaluations completed with recommendations of inpatient rehab services.  Please see preadmission assessment from earlier today as well.  Review of Systems  Constitutional: Negative for chills and fever.  HENT: Negative for hearing loss.   Eyes: Negative for blurred vision and double vision.  Respiratory: Negative for cough and shortness of breath.   Cardiovascular: Negative for chest pain, palpitations and leg swelling.  Gastrointestinal: Positive for constipation. Negative for heartburn, nausea and vomiting.  Genitourinary: Negative for dysuria, flank pain and hematuria.  Musculoskeletal: Positive for joint pain and myalgias.  Skin: Negative for rash.  Neurological: Positive for tingling and focal weakness. Negative for loss of consciousness.  Psychiatric/Behavioral: The patient has insomnia.        Bipolar  All other systems reviewed and are  negative.  Past medical history: Bipolar disorder, hypertension, morbid obesity Past Surgical History:  Procedure Laterality Date  . ORIF TIBIA PLATEAU Left 02/26/2020   Procedure: OPEN REDUCTION INTERNAL FIXATION (ORIF) TIBIAL PLATEAU;  Surgeon: Shona Needles, MD;  Location: [facility];  Service: Orthopedics;  Laterality: Left;   Medical history: No pertinent family history of trauma Social History:  reports that she has quit smoking. She has never used smokeless tobacco. She reports current alcohol use. She reports that she does not use drugs. Allergies: No Known Allergies Medications Prior to Admission  Medication Sig Dispense Refill  . diclofenac (VOLTAREN) 75 MG EC tablet Take 75 mg by mouth 2 (two) times daily.    . hydrochlorothiazide (HYDRODIURIL) 25 MG tablet Take 25 mg by mouth daily.      Drug Regimen Review Drug regimen was reviewed and remains appropriate with no significant issues identified  Home: Home Living Family/patient expects to be discharged to:: Private residence Living Arrangements: Spouse/significant other Available Help at Discharge: Family Type of Home: House Home Access: Ramped entrance [place]: One level Bathroom Shower/Tub: Chiropodist: Standard (low toilet) Bathroom Accessibility: Yes Home Equipment: Environmental consultant - 2 wheels, Hand held shower head, Adaptive equipment, Cane - quad Adaptive Equipment: Other (Comment) (leg lifter) Additional Comments: Ramp being set-up today   Functional History: Prior Function Level of Independence: Independent with assistive device(s) Comments: Pt reports being Modified Independent using RW or quad cane for mobility. Pt able to complete ADLs, IADLs in the home. Limited by back pain. Uses electric shopping cart in store  Functional Status:  Mobility: Bed Mobility Overal bed mobility: Needs Assistance Bed Mobility: Supine to Sit, Sit to Supine Supine to  sit: Min assist, HOB elevated Sit to supine:  Mod assist General bed mobility comments: Min A using gait belt as leg lifter and HOB slightly elevated. Heavy use of bed rail.   Pt required moderate assistance to manage LLE to move back into bed.  Pt placed in trendelenberg position to boost to head of bed. Increased time to position. Transfers Overall transfer level: Needs assistance Equipment used: 1 person hand held assist (assisted L side and leaning to the R with forearm supported on back of recliner.) Transfers: Sit to/from Stand Sit to Stand: Max assist, +2 physical assistance, +2 safety/equipment Squat pivot transfers: Mod assist, +2 safety/equipment  Lateral/Scoot Transfers: Min guard General transfer comment: Pt able to stand twice from elevate surface of bed.  Assistance to boost into standing with use of back of recliner on R side to pull and support weight on R forarm.  Pt stood for 10-20 sec each trial. Ambulation/Gait Ambulation/Gait assistance:  (NT)    ADL: ADL Overall ADL's : Needs assistance/impaired Eating/Feeding: Independent, Sitting Grooming: Set up, Sitting Upper Body Bathing: Minimal assistance, Sitting Lower Body Bathing: Maximal assistance, Sitting/lateral leans Upper Body Dressing : Set up, Sitting Lower Body Dressing: Total assistance, Sitting/lateral leans, Sit to/from stand Lower Body Dressing Details (indicate cue type and reason): Total A to don socks, may benefit from AE education during next session Toilet Transfer: Maximal assistance, +2 for physical assistance, +2 for safety/equipment, Squat-pivot, BSC (drop arm BSC) Toilet Transfer Details (indicate cue type and reason): Max A x 2 for squat pivot/scooting transfer to drop arm BSC, cueing for technique and pt with improving problem solving  Toileting- Clothing Manipulation and Hygiene: Total assistance, Sit to/from stand, Sitting/lateral lean, +2 for physical assistance, +2 for safety/equipment Toileting - Clothing Manipulation Details (indicate cue  type and reason): Total A for peri care after BM with slight squat stance. x 2 assist needed. Pt reports difficulty reaching and uses a bidet at home General ADL Comments: Limited by pain but motivated to return to independence and attempt all tasks  Cognition: Cognition Overall Cognitive Status: Within Functional Limits for tasks assessed Orientation Level: Oriented X4 Cognition Arousal/Alertness: Awake/alert Behavior During Therapy: WFL for tasks assessed/performed, Anxious Overall Cognitive Status: Within Functional Limits for tasks assessed General Comments: Anxiety regarding pain with therapy, looks for feedback, implements safety techniques after cueing   Physical Exam: Blood pressure (!) 150/71, pulse 68, temperature 97.8 F (36.6 C), temperature source Oral, resp. rate 17, height 5\' 8"  (1.727 m), weight 107.2 kg, SpO2 97 %. Physical Exam Vitals reviewed.  Constitutional:      Appearance: She is obese.  HENT:     Head: Normocephalic and atraumatic.     Right Ear: External ear normal.     Left Ear: External ear normal.     Nose: Nose normal.  Eyes:     General:        Right eye: No discharge.        Left eye: No discharge.     Extraocular Movements: Extraocular movements intact.  Cardiovascular:     Rate and Rhythm: Normal rate and regular rhythm.  Pulmonary:     Effort: Pulmonary effort is normal. No respiratory distress.     Breath sounds: No stridor.  Abdominal:     General: Abdomen is flat. Bowel sounds are normal. There is no distension.  Musculoskeletal:     Cervical back: Normal range of motion and neck supple.     Comments: Left knee with edema  and tenderness  Skin:    Comments: Left knee incision site CDI  Neurological:     Mental Status: She is alert and oriented to person, place, and time.     Comments: Alert Motor: Bilateral upper extremities: 5/5 proximal distal Right lower extremity: 4+/5 proximal distal Left lower extremity: Hip flexion to/5 (some  pain inhibition), knee brace, ankle dorsiflexion 4+/5  Psychiatric:        Mood and Affect: Mood normal.        Behavior: Behavior normal.        Thought Content: Thought content normal.     Results for orders placed or performed during the hospital encounter of 02/24/20 (from the past 48 hour(s))  Glucose, capillary     Status: Abnormal   Collection Time: 02/28/20  8:29 PM  Result Value Ref Range   Glucose-Capillary 109 (H) 70 - 99 mg/dL    Comment: Glucose reference range applies only to samples taken after fasting for at least 8 hours.  Basic metabolic panel     Status: Abnormal   Collection Time: 02/29/20  1:49 AM  Result Value Ref Range   Sodium 136 135 - 145 mmol/L   Potassium 4.3 3.5 - 5.1 mmol/L   Chloride 101 98 - 111 mmol/L   CO2 28 22 - 32 mmol/L   Glucose, Bld 110 (H) 70 - 99 mg/dL    Comment: Glucose reference range applies only to samples taken after fasting for at least 8 hours.   BUN 6 (L) 8 - 23 mg/dL   Creatinine, Ser 0.52 0.44 - 1.00 mg/dL   Calcium 8.7 (L) 8.9 - 10.3 mg/dL   GFR, Estimated >60 >60 mL/min    Comment: (NOTE) Calculated using the CKD-EPI Creatinine Equation (2021)    Anion gap 7 5 - 15    Comment: Performed at [facility] [address], [place], [place] [address]  CBC     Status: Abnormal   Collection Time: 02/29/20  1:49 AM  Result Value Ref Range   WBC 7.0 4.0 - 10.5 K/uL   RBC 3.52 (L) 3.87 - 5.11 MIL/uL   Hemoglobin 10.9 (L) 12.0 - 15.0 g/dL   HCT 33.1 (L) 36 - 46 %   MCV 94.0 80.0 - 100.0 fL   MCH 31.0 26.0 - 34.0 pg   MCHC 32.9 30.0 - 36.0 g/dL   RDW 13.1 11.5 - 15.5 %   Platelets 243 150 - 400 K/uL   nRBC 0.0 0.0 - 0.2 %    Comment: Performed at [facility], [place] [address], [place], [place] [address]  Glucose, capillary     Status: Abnormal   Collection Time: 02/29/20  6:48 AM  Result Value Ref Range   Glucose-Capillary 110 (H) 70 - 99 mg/dL    Comment: Glucose reference range applies only to samples taken  after fasting for at least 8 hours.  Glucose, capillary     Status: Abnormal   Collection Time: 02/29/20  8:14 PM  Result Value Ref Range   Glucose-Capillary 110 (H) 70 - 99 mg/dL    Comment: Glucose reference range applies only to samples taken after fasting for at least 8 hours.  Basic metabolic panel     Status: Abnormal   Collection Time: 03/01/20  2:35 AM  Result Value Ref Range   Sodium 136 135 - 145 mmol/L   Potassium 4.5 3.5 - 5.1 mmol/L   Chloride 98 98 - 111 mmol/L   CO2  30 22 - 32 mmol/L   Glucose, Bld 108 (H) 70 - 99 mg/dL    Comment: Glucose reference range applies only to samples taken after fasting for at least 8 hours.   BUN 8 8 - 23 mg/dL   Creatinine, Ser 0.54 0.44 - 1.00 mg/dL   Calcium 9.0 8.9 - 10.3 mg/dL   GFR, Estimated >60 >60 mL/min    Comment: (NOTE) Calculated using the CKD-EPI Creatinine Equation (2021)    Anion gap 8 5 - 15    Comment: Performed at [facility] [address] [place], [place] [address]   No results found.  Medical Problem List and Plan: 1.  Decreased functional mobility secondary to left tibial plateau fracture.  Status post ORIF 02/25/2020.  Nonweightbearing left lower extremity with hinged knee brace locked in 5 to 10 degree flexion.  Ortho does not want knee extended fully  -patient may not shower  -ELOS/Goals: 12-15 days/supervision/min a  Admit to CIR 2.  Antithrombotics: -DVT/anticoagulation: Lovenox.  Vascular studies ordered.  -antiplatelet therapy: N/A 3. Pain Management: Neurontin 300 mg 3 times daily, oxycodone and Robaxin as needed  Monitor with increased exertion 4. Mood: Provide emotional support  -antipsychotic agents: N/A 5. Neuropsych: This patient is capable of making decisions on her own behalf. 6. Skin/Wound Care: Routine skin checks 7. Fluids/Electrolytes/Nutrition: Routine in and outs.  CMP ordered. 8.  Acute blood loss anemia.    CBC ordered. 9.  Hypertension.  Norvasc 5 mg daily.    Monitor with  increased mobility. 10.  History of alcohol tobacco use.  Provide counseling 11.  Morbid obesity.  BMI 35.93.  Encourage weight loss.  Dietary follow-up  Cathlyn Parsons, PA-C 03/01/2020  I have personally performed a face to face diagnostic evaluation, including, but not limited to relevant history and physical exam findings, of this patient and developed relevant assessment and plan.  Additionally, I have reviewed and concur with the physician assistant's documentation above.  Delice Lesch, MD, ABPMR

## 2020-02-29 NOTE — Progress Notes (Signed)
Physical Therapy Treatment Patient Details Name: [name] MRN: [id number] DOB: [birth year] Today's Date: 02/29/2020    History of Present Illness Pt is a 64 y.o. female admitted 02/24/20 after fall sustaining L tibial plateau fx with lateral collateral ligament avulsion injury. S/p L tibial ORIF on 02/26/20. PMH includes L femur IMN, HTN, obesity, bipolar disorder, ETOH use.    PT Comments    Pt supine in bed on arrival.  Pt very eager and motivated to mobilize.  She was able achieve standing x 2 trials this session with heavy support on R side.  Pt continues to benefit from aggressive rehab in a post acute setting to maximize functional gains before returning home.      Follow Up Recommendations  CIR;Supervision for mobility/OOB     Equipment Recommendations  Wheelchair (measurements PT);Wheelchair cushion (measurements PT);Other (comment)    Recommendations for Other Services       Precautions / Restrictions Precautions Precautions: Fall Required Braces or Orthoses: Other Brace Other Brace: L knee hinged brace locked in 5-10' flexion (ortho does not want knee extended fully) Restrictions Weight Bearing Restrictions: Yes LLE Weight Bearing: Non weight bearing    Mobility  Bed Mobility Overal bed mobility: Needs Assistance Bed Mobility: Supine to Sit;Sit to Supine     Supine to sit: Min assist;HOB elevated Sit to supine: Mod assist   General bed mobility comments: Min A using gait belt as leg lifter and HOB slightly elevated. Heavy use of bed rail.   Pt required moderate assistance to manage LLE to move back into bed.  Pt placed in trendelenberg position to boost to head of bed. Increased time to position.  Transfers Overall transfer level: Needs assistance Equipment used: 1 person hand held assist (assisted L side and leaning to the R with forearm supported on back of recliner.) Transfers: Sit to/from Stand     Squat pivot transfers: Mod assist;+2 safety/equipment      General transfer comment: Pt able to stand twice from elevate surface of bed.  Assistance to boost into standing with use of back of recliner on R side to pull and support weight on R forarm.  Pt stood for 10-20 sec each trial.  Ambulation/Gait Ambulation/Gait assistance:  (NT)               Stairs             Wheelchair Mobility    Modified Rankin (Stroke Patients Only)       Balance Overall balance assessment: Needs assistance;History of Falls Sitting-balance support: No upper extremity supported;Feet supported Sitting balance-Leahy Scale: Good       Standing balance-Leahy Scale: Poor Standing balance comment: Dependent for posterior pericare with full standing. heavy reliance on RUE support on back of recliner.                            Cognition Arousal/Alertness: Awake/alert Behavior During Therapy: WFL for tasks assessed/performed;Anxious Overall Cognitive Status: Within Functional Limits for tasks assessed                                 General Comments: Anxiety regarding pain with therapy, looks for feedback, implements safety techniques after cueing       Exercises      General Comments        Pertinent Vitals/Pain Pain Assessment: 0-10 Pain Score: 10-Worst pain ever Pain Location:  LLE with mobility Pain Descriptors / Indicators: Grimacing;Guarding;Moaning Pain Intervention(s): Monitored during session;Repositioned    Home Living                      Prior Function            PT Goals (current goals can now be found in the care plan section) Acute Rehab PT Goals Patient Stated Goal: be able to take care of self independently  Potential to Achieve Goals: Fair Progress towards PT goals: Progressing toward goals    Frequency    Min 5X/week      PT Plan Current plan remains appropriate    Co-evaluation              AM-PAC PT "6 Clicks" Mobility   Outcome Measure  Help needed  turning from your back to your side while in a flat bed without using bedrails?: A Little Help needed moving from lying on your back to sitting on the side of a flat bed without using bedrails?: A Lot Help needed moving to and from a bed to a chair (including a wheelchair)?: Total Help needed standing up from a chair using your arms (e.g., wheelchair or bedside chair)?: Total Help needed to walk in hospital room?: Total Help needed climbing 3-5 steps with a railing? : Total 6 Click Score: 9    End of Session Equipment Utilized During Treatment: Gait belt Activity Tolerance: Patient tolerated treatment well Patient left: in chair;with call bell/phone within reach;with chair alarm set Nurse Communication: Mobility status;Need for lift equipment PT Visit Diagnosis: Other abnormalities of gait and mobility (R26.89);Pain;Muscle weakness (generalized) (M62.81) Pain - Right/Left: Left Pain - part of body: Leg;Knee     Time: 1683-7290 PT Time Calculation (min) (ACUTE ONLY): 33 min  Charges:  $Therapeutic Activity: 23-37 mins                     Erasmo Leventhal , PTA Acute Rehabilitation Services Pager [phone] Office [phone]     Leary Mcnulty Eli Hose 02/29/2020, 5:35 PM

## 2020-02-29 NOTE — Progress Notes (Signed)
Subjective:  [name] is a 64 year old female with past medical history of morbid obesity, bipolar disorder, prior left femur fracture, alcohol use disorder and hypertension (on HCTZ) who presented to [facility] on the evening of 11/06 after a fall resulting in a left tibial plateau fracture, LCL injury, and fibular fracture with a laboratory evidence of hyponatremia (124) s/p ORIF on 35/36 without complication.  Overnight, no acute events.  This morning, patient reports that her pain is well controlled at rest on current regimen. She states that her pain is worse with activity, however she looks forward to continuing to work with physical and occupational therapy as well as discharge to inpatient rehab. She has no further questions or concerns.  Objective:  Vital signs in last 24 hours: Vitals:   02/28/20 0800 02/28/20 1513 02/28/20 1957 02/29/20 0427  BP: (!) 151/66 (!) 143/66 137/71 (!) 115/92  Pulse: 78 77 80 64  Resp: 18 18 18 16   Temp: 98.2 F (36.8 C) 97.7 F (36.5 C) 98.4 F (36.9 C) (!) 97.3 F (36.3 C)  TempSrc:  Oral Oral Oral  SpO2: 98% 96% 99% 99%  Weight:      Height:      On room air  Intake/Output Summary (Last 24 hours) at 02/29/2020 0642 Last data filed at 02/29/2020 0500 Gross per 24 hour  Intake 1080 ml  Output 5300 ml  Net -4220 ml   Filed Weights   02/26/20 1806  Weight: 107.2 kg   Physical Exam Vitals and nursing note reviewed. Exam conducted with a chaperone present.  Constitutional:      General: She is not in acute distress.    Appearance: She is obese.  HENT:     Head: Normocephalic and atraumatic.  Eyes:     Extraocular Movements: Extraocular movements intact.     Conjunctiva/sclera: Conjunctivae normal.  Cardiovascular:     Rate and Rhythm: Normal rate and regular rhythm.     Pulses: Normal pulses.     Heart sounds: Normal heart sounds.  Pulmonary:     Breath sounds: Normal breath sounds.  Abdominal:     General: Abdomen is flat.  Bowel sounds are normal.     Palpations: Abdomen is soft.     Tenderness: There is no abdominal tenderness.  Musculoskeletal:     Cervical back: Normal range of motion and neck supple.     Comments: Patient's left lower extremity in knee brace.  Skin:    General: Skin is warm and dry.     Capillary Refill: Capillary refill takes less than 2 seconds.  Neurological:     General: No focal deficit present.     Mental Status: She is alert. Mental status is at baseline.     Comments: Patient reports intact sensation in left lower extremity and toes.  Psychiatric:        Mood and Affect: Mood normal.        Behavior: Behavior normal.        Thought Content: Thought content normal.        Judgment: Judgment normal.    LABS: CBC Latest Ref Rng & Units 02/29/2020 02/28/2020 02/27/2020  WBC 4.0 - 10.5 K/uL 7.0 7.2 9.0  Hemoglobin 12.0 - 15.0 g/dL 10.9(L) 10.9(L) 11.4(L)  Hematocrit 36 - 46 % 33.1(L) 33.0(L) 35.2(L)  Platelets 150 - 400 K/uL 243 247 241   BMP Latest Ref Rng & Units 02/29/2020 02/28/2020 02/27/2020  Glucose 70 - 99 mg/dL 110(H) 99 160(H)  BUN 8 - 23 mg/dL 6(L) 8 7(L)  Creatinine 0.44 - 1.00 mg/dL 0.52 0.52 0.63  Sodium 135 - 145 mmol/L 136 135 130(L)  Potassium 3.5 - 5.1 mmol/L 4.3 4.5 4.5  Chloride 98 - 111 mmol/L 101 97(L) 94(L)  CO2 22 - 32 mmol/L 28 29 29   Calcium 8.9 - 10.3 mg/dL 8.7(L) 8.6(L) 8.6(L)   25-OH Vitamin D - 18.03 Parathyroid hormone - 40  IMAGING: DG Tibia/Fibula Left  Result Date: 02/26/2020 CLINICAL DATA:  ORIF tibial plateau fracture. EXAM: LEFT TIBIA AND FIBULA - 2 VIEW; DG C-ARM 1-60 MIN FLUOROSCOPY TIME:  1 minute, 21 seconds (7.9 mGy) COMPARISON:  Left knee radiographs-02/25/2020; left knee CT-02/25/2020 FINDINGS: Seven spot intraoperative fluoroscopic images of the left knee are provided for review and demonstrate the C quell a of sideplate fixation of previously identified impacted medial tibial plateau fracture. Alignment appears near anatomic.  Note is also made of the distal end of a intramedullary femoral rod, incompletely evaluated. No radiopaque foreign body. IMPRESSION: Post sideplate fixation of medial tibial plateau fracture without evidence of complication. Electronically Signed   By: Sandi Mariscal M.D.   On: 02/26/2020 13:51   DG Knee Left Port  Result Date: 02/26/2020 CLINICAL DATA:  64 year old female postoperative films following ORIF. EXAM: PORTABLE LEFT KNEE - 1-2 VIEW COMPARISON:  Intraoperative images 12 45 hours today. FINDINGS: AP and cross-table lateral views. Pre-existing distal left femur intramedullary rod and distal interlocking cortical screws. New anterior/medial proximal tibia plate and screws. No adverse hardware features. No new osseous abnormality identified. IMPRESSION: Proximal left tibia ORIF with no adverse features. Electronically Signed   By: Genevie Ann M.D.   On: 02/26/2020 18:13   DG C-Arm 1-60 Min  Result Date: 02/26/2020 CLINICAL DATA:  ORIF tibial plateau fracture. EXAM: LEFT TIBIA AND FIBULA - 2 VIEW; DG C-ARM 1-60 MIN FLUOROSCOPY TIME:  1 minute, 21 seconds (7.9 mGy) COMPARISON:  Left knee radiographs-02/25/2020; left knee CT-02/25/2020 FINDINGS: Seven spot intraoperative fluoroscopic images of the left knee are provided for review and demonstrate the C quell a of sideplate fixation of previously identified impacted medial tibial plateau fracture. Alignment appears near anatomic. Note is also made of the distal end of a intramedullary femoral rod, incompletely evaluated. No radiopaque foreign body. IMPRESSION: Post sideplate fixation of medial tibial plateau fracture without evidence of complication. Electronically Signed   By: Sandi Mariscal M.D.   On: 02/26/2020 13:51   DG FEMUR MIN 2 VIEWS LEFT  Result Date: 02/28/2020 CLINICAL DATA:  Left femoral fracture. EXAM: LEFT FEMUR 2 VIEWS COMPARISON:  February 26, 2020. FINDINGS: Status post intramedullary rod fixation of left femur for treatment of old healed  proximal left femoral fracture. No acute fracture or dislocation is noted involving the left femur. IMPRESSION: Status post intramedullary rod fixation of old healed proximal left femoral fracture. Electronically Signed   By: Marijo Conception M.D.   On: 02/28/2020 13:47   Assessment/Plan:  Principal Problem:   Tibial plateau fracture, left Active Problems:   Tibial plateau fracture   Hyponatremia   Alcohol use disorder, moderate, dependence (Rafael Hernandez)   Essential hypertension  Ms. Wanetta Funderburke is a 64 year old female with past medical history of morbid obesity, bipolar disorder, prior left femur fracture, alcohol use disorder and hypertension (on HCTZ) who presented to [facility] on the evening of 11/06 after a fall resulting in a left tibial plateau fracture, LCL injury, and fibular fracture with a laboratory evidence of hyponatremia (124) s/p ORIF on 11/08  without complication.  #Left medial tibial plateau fracture, PCL/LCL injury, knee posterior capsular disruption s/p ORIF, stable Patient presented with fractures with ligament injury following fall. Patient underwent orthopedic surgery on 20/11/221 without complications. Patient doing well on current pain medication regimen.  -Pain regimen per ortho:  -Oxycodone 5-10mg  Q4H PRN  -Robaxin 500mg  Q6H PRN  -Toradol 15mg  Q6H scheduled  -Gabapentin 300mg  TID scheduled  -Tylenol 650mg  Q6H scheduled  -Hydromorphone 0.5-1mg  Q3H PRN for breakthrough pain -PT/OT eval and treat, recommending CIR  #HTN, chronic, stable Patient's blood pressure well controlled on amlodipine. -Discontinued HCTZ in setting of hyponatremia -Continue amlodipine 5mg  -Plan to follow-up with PCP for continued management  #Vitamin D deficiency, chronic, stable Patient's 25-OH vitamin D level 18.03 with normal PTH level. 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 HCTZ was discontinued and sodium most recently 136 this morning. -Daily BMP -Discontinued HCTZ  #VTE ppx: Lovenox #Diet: Heart healthy #Code status: Full code #Bowel regimen: Senna twice daily #PT/OT recs: CIR, awaiting admission  Cato Mulligan, MD 02/29/2020, 6:42 AM Pager: [phone] After 5pm on weekdays and 1pm on weekends: On Call pager [phone]

## 2020-02-29 NOTE — Discharge Summary (Addendum)
Name: [name] MRN: [id number] DOB: [birth year] 64 y.o. PCP: Rachel Mires, MD  Date of Admission: 02/24/2020 10:56 PM Date of Discharge:  Attending Physician: Aldine Contes, MD  Discharge Diagnosis: 1. Left Tibial Plateau Fracture complicated by ligamentous injury s/p ORIF 2. Vitamin D deficiency 3. Hypertension 4. Hyponatremia (resolved) 5. Alcohol use disorder  Discharge Medications: Allergies as of 03/01/2020   No Known Allergies     Medication List    STOP taking these medications   hydrochlorothiazide 25 MG tablet Commonly known as: HYDRODIURIL     TAKE these medications   acetaminophen 325 MG tablet Commonly known as: TYLENOL Take 2 tablets (650 mg total) by mouth every 6 (six) hours.   amLODipine 5 MG tablet Commonly known as: NORVASC Take 1 tablet (5 mg total) by mouth daily. Start taking on: March 02, 2020   diclofenac 75 MG EC tablet Commonly known as: VOLTAREN Take 75 mg by mouth 2 (two) times daily.   diphenhydrAMINE 25 mg capsule Commonly known as: BENADRYL Take 1 capsule (25 mg total) by mouth every 6 (six) hours as needed for itching.   enoxaparin 40 MG/0.4ML injection Commonly known as: LOVENOX Inject 0.4 mLs (40 mg total) into the skin daily. Start taking on: March 02, 2020   gabapentin 300 MG capsule Commonly known as: NEURONTIN Take 1 capsule (300 mg total) by mouth 3 (three) times daily.   methocarbamol 500 MG tablet Commonly known as: ROBAXIN Take 1 tablet (500 mg total) by mouth every 6 (six) hours as needed for muscle spasms.   oxyCODONE 5 MG immediate release tablet Commonly known as: Oxy IR/ROXICODONE Take 1-2 tablets (5-10 mg total) by mouth every 4 (four) hours as needed for moderate pain or severe pain (5 mg moderate pain, 10 mg severe pain).   senna 8.6 MG Tabs tablet Commonly known as: SENOKOT Take 1 tablet (8.6 mg total) by mouth 2 (two) times daily.   Vitamin D (Ergocalciferol) 1.25 MG (50000 UNIT) Caps  capsule Commonly known as: DRISDOL Take 1 capsule (50,000 Units total) by mouth every 7 (seven) days. Start taking on: March 03, 2020       Disposition and follow-up:   [name] was discharged from [facility] in Stable condition.  At the hospital follow up visit please address:  1.  Left tibial plateau plateau fracture with ligamentous injury s/p ORIF 11/8. Discharged to inpatient rehabilitation for ongoing needs.  --Non-weight bearing on LLE --Wound care: ok to leave open to air. Ok to shower with assistance --Orthopedic devices: Bledsoe brace locked in 10 degrees flexion to left knee --Follow up with orthopedics 2 weeks after discharge for repeat xrays. --Pain management: tylenol, oxycodone 5-10mg  every 4 hours as needed, robaxin 500mg  every 6 hours as needed for muscle spasms, gabapentin 300mg  three times daily  2. Vitamin D deficiency. She does have a history of femur fracture 2 years ago--unsure if these may be fragility fractures. Evaluate for possible need for bisphosphonate therapy. -Medication change: START Vitamin D 50,000U weekly for 6 weeks followed by 800U daily thereafter.  3. Hypertension/ Hyponatremia. Pt recently started on 25mg  hctz by PCP on 10/20. Sodium 124 on this admission. Hyponatremia now resolved.  Medication changes: HCTZ 25mg  discontinued. Started amlodipine 5mg  daily. Follow up: with PCP after discharge for blood pressure recheck and BMP  4. Alcohol use disorder. Pt notes drinking about a 6 pack of beer three to four times a week. Encourage cessation  2.  Labs /  imaging needed at time of follow-up: BMP  3.  Pending labs/ test needing follow-up: none  Follow-up Appointments: Orthopedics, PCP  Follow-up Information    Rachel Mires, MD. Call in 1 week(s).   Specialty: Family Medicine Contact information: [place] [address] [phone]              Hospital Course: #Left medial  tibial plateau fracture, PCL/LCL injury, knee posterior capsular disruption s/p ORIF, stable Patient presented with fractures with ligament injury following fall. Patient underwent operation on 56/05/1306 without complications. Patient required de-escalation of her pain medication regimen as she is pending discharge to CIR. Currently doing well on current regimen. -Tylenol 650mg  Q6H scheduled -Gabapentin 300mg  TID scheduled -Robaxin 500mg  Q6H PRN -Oxycodone 5-10mg  Q4H PRN -Continue PT/OT while admitted -Discharge to CIR  #HTN, chronic Patient's blood pressure elevated to 140s-150s in the setting of pain following surgery and discontinuation of HCTZ with transition to amlodipine.  -Discontinued HCTZ in setting of hyponatremia -Continue amlodipine 5mg  -Plan to follow-up with PCP for continued management of this chronic medical condition  #Vitamin D deficiency, chronic, stable Patient's 25-OH vitamin D level 18.03 with normal PTH level. 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 HCTZ was discontinued and sodium within normal limits for the past few days. -Daily BMP  Discharge Vitals:   BP (!) 154/75 (BP Location: Right Arm)   Pulse 76   Temp 97.9 F (36.6 C) (Oral)   Resp 17   Ht 5\' 8"  (1.727 m)   Wt 107.2 kg   SpO2 98%   BMI 35.93 kg/m   Pertinent Labs, Studies, and Procedures:  CBC Latest Ref Rng & Units 02/29/2020 02/28/2020 02/27/2020  WBC 4.0 - 10.5 K/uL 7.0 7.2 9.0  Hemoglobin 12.0 - 15.0 g/dL 10.9(L) 10.9(L) 11.4(L)  Hematocrit 36 - 46 % 33.1(L) 33.0(L) 35.2(L)  Platelets 150 - 400 K/uL 243 247 241   CMP Latest Ref Rng & Units 03/01/2020 02/29/2020 02/28/2020   Glucose 70 - 99 mg/dL 108(H) 110(H) 99  BUN 8 - 23 mg/dL 8 6(L) 8  Creatinine 0.44 - 1.00 mg/dL 0.54 0.52 0.52  Sodium 135 - 145 mmol/L 136 136 135  Potassium 3.5 - 5.1 mmol/L 4.5 4.3 4.5  Chloride 98 - 111 mmol/L 98 101 97(L)  CO2 22 - 32 mmol/L 30 28 29   Calcium 8.9 - 10.3 mg/dL 9.0 8.7(L) 8.6(L)  Total Protein 6.5 - 8.1 g/dL - - -  Total Bilirubin 0.3 - 1.2 mg/dL - - -  Alkaline Phos 38 - 126 U/L - - -  AST 15 - 41 U/L - - -  ALT 0 - 44 U/L - - -   DG Knee Left Port  Result Date: 02/26/2020 CLINICAL DATA:  64 year old female postoperative films following ORIF. EXAM: PORTABLE LEFT KNEE - 1-2 VIEW COMPARISON:  Intraoperative images 12 45 hours today. FINDINGS: AP and cross-table lateral views. Pre-existing distal left femur intramedullary rod and distal interlocking cortical screws. New anterior/medial proximal tibia plate and screws. No adverse hardware features. No new osseous abnormality identified. IMPRESSION: Proximal left tibia ORIF with no adverse features. Electronically Signed   By: Genevie Ann M.D.   On: 02/26/2020 18:13   DG FEMUR MIN 2 VIEWS  LEFT  Result Date: 02/28/2020 CLINICAL DATA:  Left femoral fracture. EXAM: LEFT FEMUR 2 VIEWS COMPARISON:  February 26, 2020. FINDINGS: Status post intramedullary rod fixation of left femur for treatment of old healed proximal left femoral fracture. No acute fracture or dislocation is noted involving the left femur. IMPRESSION: Status post intramedullary rod fixation of old healed proximal left femoral fracture. Electronically Signed   By: Marijo Conception M.D.   On: 02/28/2020 13:47   Discharge Instructions: Discharge Instructions    Call MD for:  difficulty breathing, headache or visual disturbances   Complete by: As directed    Call MD for:  extreme fatigue   Complete by: As directed    Call MD for:  hives   Complete by: As directed    Call MD for:  persistant dizziness or light-headedness   Complete by: As directed    Call MD for:   persistant nausea and vomiting   Complete by: As directed    Call MD for:  redness, tenderness, or signs of infection (pain, swelling, redness, odor or green/yellow discharge around incision site)   Complete by: As directed    Call MD for:  severe uncontrolled pain   Complete by: As directed    Call MD for:  temperature >100.4   Complete by: As directed    Diet - low sodium heart healthy   Complete by: As directed    Increase activity slowly   Complete by: As directed    No wound care   Complete by: As directed      Signed: Foy Guadalajara, MD 03/01/2020, 2:44 PM   Pager: [phone]

## 2020-03-01 ENCOUNTER — Inpatient Hospital Stay (HOSPITAL_COMMUNITY)
Admit: 2020-03-01 | Discharge: 2020-03-16 | DRG: 560 | Disposition: A | Discharge status: Home / Self Care | Payer: BC Managed Care – PPO | Source: Intra-hospital | Attending: Physical Medicine & Rehabilitation | Admitting: Physical Medicine & Rehabilitation

## 2020-03-01 DIAGNOSIS — R52 Pain, unspecified: Secondary | ICD-10-CM

## 2020-03-01 DIAGNOSIS — Z79899 Other long term (current) drug therapy: Secondary | ICD-10-CM | POA: Diagnosis not present

## 2020-03-01 DIAGNOSIS — L299 Pruritus, unspecified: Secondary | ICD-10-CM

## 2020-03-01 DIAGNOSIS — K5903 Drug induced constipation: Secondary | ICD-10-CM

## 2020-03-01 DIAGNOSIS — F319 Bipolar disorder, unspecified: Secondary | ICD-10-CM | POA: Diagnosis present

## 2020-03-01 DIAGNOSIS — Z716 Tobacco abuse counseling: Secondary | ICD-10-CM

## 2020-03-01 DIAGNOSIS — F1010 Alcohol abuse, uncomplicated: Secondary | ICD-10-CM | POA: Diagnosis present

## 2020-03-01 DIAGNOSIS — S82142D Displaced bicondylar fracture of left tibia, subsequent encounter for closed fracture with routine healing: Secondary | ICD-10-CM

## 2020-03-01 DIAGNOSIS — S82132A Displaced fracture of medial condyle of left tibia, initial encounter for closed fracture: Secondary | ICD-10-CM | POA: Diagnosis present

## 2020-03-01 DIAGNOSIS — E871 Hypo-osmolality and hyponatremia: Secondary | ICD-10-CM | POA: Diagnosis present

## 2020-03-01 DIAGNOSIS — T148XXA Other injury of unspecified body region, initial encounter: Secondary | ICD-10-CM

## 2020-03-01 DIAGNOSIS — I10 Essential (primary) hypertension: Secondary | ICD-10-CM

## 2020-03-01 DIAGNOSIS — E8809 Other disorders of plasma-protein metabolism, not elsewhere classified: Secondary | ICD-10-CM | POA: Diagnosis present

## 2020-03-01 DIAGNOSIS — M79609 Pain in unspecified limb: Secondary | ICD-10-CM | POA: Diagnosis not present

## 2020-03-01 DIAGNOSIS — E559 Vitamin D deficiency, unspecified: Secondary | ICD-10-CM

## 2020-03-01 DIAGNOSIS — R350 Frequency of micturition: Secondary | ICD-10-CM

## 2020-03-01 DIAGNOSIS — G479 Sleep disorder, unspecified: Secondary | ICD-10-CM

## 2020-03-01 DIAGNOSIS — Z87891 Personal history of nicotine dependence: Secondary | ICD-10-CM | POA: Diagnosis not present

## 2020-03-01 DIAGNOSIS — F411 Generalized anxiety disorder: Secondary | ICD-10-CM

## 2020-03-01 DIAGNOSIS — Z7141 Alcohol abuse counseling and surveillance of alcoholic: Secondary | ICD-10-CM | POA: Diagnosis not present

## 2020-03-01 DIAGNOSIS — S82141A Displaced bicondylar fracture of right tibia, initial encounter for closed fracture: Secondary | ICD-10-CM

## 2020-03-01 DIAGNOSIS — Z6841 Body Mass Index (BMI) 40.0 and over, adult: Secondary | ICD-10-CM

## 2020-03-01 DIAGNOSIS — G8918 Other acute postprocedural pain: Secondary | ICD-10-CM | POA: Diagnosis not present

## 2020-03-01 DIAGNOSIS — D62 Acute posthemorrhagic anemia: Secondary | ICD-10-CM | POA: Diagnosis present

## 2020-03-01 DIAGNOSIS — S82132D Displaced fracture of medial condyle of left tibia, subsequent encounter for closed fracture with routine healing: Secondary | ICD-10-CM | POA: Diagnosis not present

## 2020-03-01 DIAGNOSIS — W19XXXD Unspecified fall, subsequent encounter: Secondary | ICD-10-CM | POA: Diagnosis present

## 2020-03-01 DIAGNOSIS — M79662 Pain in left lower leg: Secondary | ICD-10-CM

## 2020-03-01 DIAGNOSIS — R3915 Urgency of urination: Secondary | ICD-10-CM

## 2020-03-01 DIAGNOSIS — S82142S Displaced bicondylar fracture of left tibia, sequela: Secondary | ICD-10-CM

## 2020-03-01 DIAGNOSIS — S82132S Displaced fracture of medial condyle of left tibia, sequela: Secondary | ICD-10-CM | POA: Diagnosis not present

## 2020-03-01 DIAGNOSIS — Z713 Dietary counseling and surveillance: Secondary | ICD-10-CM | POA: Diagnosis not present

## 2020-03-01 DIAGNOSIS — M7989 Other specified soft tissue disorders: Secondary | ICD-10-CM | POA: Diagnosis not present

## 2020-03-01 LAB — BASIC METABOLIC PANEL
Anion gap: 8 (ref 5–15)
BUN: 8 mg/dL (ref 8–23)
CO2: 30 mmol/L (ref 22–32)
Calcium: 9 mg/dL (ref 8.9–10.3)
Chloride: 98 mmol/L (ref 98–111)
Creatinine, Ser: 0.54 mg/dL (ref 0.44–1.00)
GFR, Estimated: >60 mL/min (ref >60)
Glucose, Bld: 108 mg/dL — ABNORMAL HIGH (ref 70–99)
Potassium: 4.5 mmol/L (ref 3.5–5.1)
Sodium: 136 mmol/L (ref 135–145)

## 2020-03-01 MED ORDER — OXYCODONE HCL 5 MG PO TABS
5.0000 mg | ORAL_TABLET | ORAL | 0 refills | Status: DC | PRN
Start: 2020-03-01 — End: 2020-03-15

## 2020-03-01 MED ORDER — ACETAMINOPHEN 325 MG PO TABS
325.0000 mg | ORAL_TABLET | ORAL | Status: DC | PRN
  Administered 2020-03-02 – 2020-03-15 (×8): 650 mg via ORAL
  Filled 2020-03-01 (×8): qty 2

## 2020-03-01 MED ORDER — SENNA 8.6 MG PO TABS
1.0000 | ORAL_TABLET | Freq: Two times a day (BID) | ORAL | Status: DC
  Administered 2020-03-01 – 2020-03-16 (×30): 8.6 mg via ORAL
  Filled 2020-03-01 (×30): qty 1

## 2020-03-01 MED ORDER — ENOXAPARIN SODIUM 40 MG/0.4ML ~~LOC~~ SOLN
40.0000 mg | SUBCUTANEOUS | Status: DC
Start: 2020-03-02 — End: 2020-03-16

## 2020-03-01 MED ORDER — METHOCARBAMOL 500 MG PO TABS: 500.0000 mg | ORAL_TABLET | Freq: Four times a day (QID) | ORAL | Status: DC | PRN

## 2020-03-01 MED ORDER — GABAPENTIN 300 MG PO CAPS
300.0000 mg | ORAL_CAPSULE | Freq: Three times a day (TID) | ORAL | Status: DC
  Administered 2020-03-02 – 2020-03-03 (×4): 300 mg via ORAL
  Filled 2020-03-01 (×4): qty 1

## 2020-03-01 MED ORDER — ENOXAPARIN SODIUM 40 MG/0.4ML ~~LOC~~ SOLN
40.0000 mg | SUBCUTANEOUS | Status: DC
  Administered 2020-03-02 – 2020-03-16 (×15): 40 mg via SUBCUTANEOUS
  Filled 2020-03-01 (×16): qty 0.4

## 2020-03-01 MED ORDER — DIPHENHYDRAMINE HCL 25 MG PO CAPS
25.0000 mg | ORAL_CAPSULE | Freq: Four times a day (QID) | ORAL | Status: DC | PRN
  Administered 2020-03-01 – 2020-03-03 (×5): 25 mg via ORAL
  Filled 2020-03-01 (×5): qty 1

## 2020-03-01 MED ORDER — GABAPENTIN 300 MG PO CAPS
300.0000 mg | ORAL_CAPSULE | Freq: Three times a day (TID) | ORAL | Status: DC
Start: 2020-03-01 — End: 2020-03-16

## 2020-03-01 MED ORDER — ENOXAPARIN SODIUM 40 MG/0.4ML ~~LOC~~ SOLN
40.0000 mg | SUBCUTANEOUS | Status: DC
Start: 2020-03-02 — End: 2020-03-01

## 2020-03-01 MED ORDER — ACETAMINOPHEN 325 MG PO TABS
650.0000 mg | ORAL_TABLET | Freq: Four times a day (QID) | ORAL | Status: DC
Start: 2020-03-01 — End: 2020-08-21

## 2020-03-01 MED ORDER — VITAMIN D (ERGOCALCIFEROL) 1.25 MG (50000 UNIT) PO CAPS
50000.0000 [IU] | ORAL_CAPSULE | ORAL | Status: DC
Start: 2020-03-03 — End: 2020-03-15

## 2020-03-01 MED ORDER — AMLODIPINE BESYLATE 5 MG PO TABS
5.0000 mg | ORAL_TABLET | Freq: Every day | ORAL | Status: DC
  Administered 2020-03-02 – 2020-03-16 (×15): 5 mg via ORAL
  Filled 2020-03-01 (×15): qty 1

## 2020-03-01 MED ORDER — SENNA 8.6 MG PO TABS
1.0000 | ORAL_TABLET | Freq: Two times a day (BID) | ORAL | 0 refills | Status: DC
Start: 2020-03-01 — End: 2020-03-15

## 2020-03-01 MED ORDER — VITAMIN D (ERGOCALCIFEROL) 1.25 MG (50000 UNIT) PO CAPS
50000.0000 [IU] | ORAL_CAPSULE | ORAL | Status: DC
  Administered 2020-03-04 – 2020-03-11 (×2): 50000 [IU] via ORAL
  Filled 2020-03-01 (×2): qty 1

## 2020-03-01 MED ORDER — DIPHENHYDRAMINE HCL 25 MG PO CAPS: 25.0000 mg | ORAL_CAPSULE | Freq: Four times a day (QID) | ORAL | 0 refills | Status: DC | PRN

## 2020-03-01 MED ORDER — METHOCARBAMOL 500 MG PO TABS
500.0000 mg | ORAL_TABLET | Freq: Four times a day (QID) | ORAL | Status: DC | PRN
  Administered 2020-03-02 – 2020-03-14 (×37): 500 mg via ORAL
  Filled 2020-03-01 (×42): qty 1

## 2020-03-01 MED ORDER — AMLODIPINE BESYLATE 5 MG PO TABS
5.0000 mg | ORAL_TABLET | Freq: Every day | ORAL | Status: DC
Start: 2020-03-02 — End: 2020-03-15

## 2020-03-01 MED ORDER — OXYCODONE HCL 5 MG PO TABS
5.0000 mg | ORAL_TABLET | ORAL | Status: DC | PRN
  Administered 2020-03-01 – 2020-03-04 (×12): 10 mg via ORAL
  Administered 2020-03-04: 5 mg via ORAL
  Administered 2020-03-04 – 2020-03-07 (×18): 10 mg via ORAL
  Administered 2020-03-07: 5 mg via ORAL
  Administered 2020-03-07 – 2020-03-12 (×25): 10 mg via ORAL
  Filled 2020-03-01 (×60): qty 2

## 2020-03-01 NOTE — Progress Notes (Signed)
PT Cancellation Note  Patient Details Name: [name] MRN: [id number] DOB: 03/11/1956   Cancelled Treatment:    Reason Eval/Treat Not Completed: (P) Other (comment) (Pt plan to admit to CIR today, will defer session due to upcoming discharge.)   Carlene Coria 03/01/2020, 4:17 PM

## 2020-03-01 NOTE — Progress Notes (Signed)
Orthopaedic Trauma Progress Note  S: Pain controlled, not requiring much medication.  Has received insurance approval for CIR, no beds available currently.  O:  Vitals:   03/01/20 0454 03/01/20 0834  BP: (!) 158/77 (!) 154/75  Pulse: 63 76  Resp: 15 17  Temp: 98.3 F (36.8 C) 97.9 F (36.6 C)  SpO2: 99% 98%    General: Sitting up in bed, no acute distress Respiratory:  No increased work of breathing.  Left Lower Extremity: Bledsoe brace in place locked in 10 degrees of flexion.  Incision clean, dry, intact with steri-strips in place. Tenderness through lower leg as expected. Swelling appropriate.  Sensation intact distally. Able to wiggle toes. Foot warm and well-perfused. Compartments soft and compressible. Otherwise neurovascularly intact  Imaging: Stable post op imaging.  Labs:  Results for orders placed or performed during the hospital encounter of 02/24/20 (from the past 24 hour(s))  Glucose, capillary     Status: Abnormal   Collection Time: 02/29/20  8:14 PM  Result Value Ref Range   Glucose-Capillary 110 (H) 70 - 99 mg/dL  Basic metabolic panel     Status: Abnormal   Collection Time: 03/01/20  2:35 AM  Result Value Ref Range   Sodium 136 135 - 145 mmol/L   Potassium 4.5 3.5 - 5.1 mmol/L   Chloride 98 98 - 111 mmol/L   CO2 30 22 - 32 mmol/L   Glucose, Bld 108 (H) 70 - 99 mg/dL   BUN 8 8 - 23 mg/dL   Creatinine, Ser 0.54 0.44 - 1.00 mg/dL   Calcium 9.0 8.9 - 10.3 mg/dL   GFR, Estimated >60 >60 mL/min   Anion gap 8 5 - 15    Assessment: 64 year old female status post fall, 4 Days Post-Op   Injuries:1.  Left medial tibial plateau fracture s/p ORIF 2. Left PCL/LCL injury 3. Left knee posterior capsular disruption  Weightbearing: NWB LLE  Insicional and dressing care: Ok to leave incision open to air Showering: Okay to begin showering with assistance  Orthopedic device(s): Bledsoe brace left knee, locked in 10 degrees flexion   CV/Blood loss: Hemoglobin stable.    Pain management:  1. Tylenol 650 mg q 6 hours scheduled 2. Robaxin 500 mg q 6 hours PRN 3. Oxycodone 5-10 mg q 4 hours PRN 4. Neurontin 300 mg TID  VTE prophylaxis: Lovenox, SCD RLE  ID:  Ancef 2gm post op completed  Foley/Lines:  No foley, KVO IVFs  Medical co-morbidities: morbid obesity, bipolar disorder, hypertension  Impediments to Fracture Healing: Vit D level 18, continue home dose vitamin D supplementation  Dispo: Patient ok for d/c to CIR from ortho standpoint once bed available.  Continue Lovenox for 30 days postop  Follow - up plan: Will continue to follow while in hospital and plan for outpateint follow-up  2 weeks after discharge for repeat x-rays  Contact information:  Katha Hamming MD, Patrecia Pace PA-C   [facility] [phone] (office) [email or web address]

## 2020-03-01 NOTE — Plan of Care (Signed)
  Problem: Education: Goal: Knowledge of General Education information will improve Description: Including pain rating scale, medication(s)/side effects and non-pharmacologic comfort measures Outcome: Progressing   Problem: Health Behavior/Discharge Planning: Goal: Ability to manage health-related needs will improve Outcome: Progressing   Problem: Clinical Measurements: Goal: Ability to maintain clinical measurements within normal limits will improve Outcome: Progressing Goal: Will remain free from infection Outcome: Progressing Goal: Diagnostic test results will improve Outcome: Progressing Goal: Respiratory complications will improve Outcome: Progressing Goal: Cardiovascular complication will be avoided Outcome: Progressing   Problem: Nutrition: Goal: Adequate nutrition will be maintained Outcome: Progressing   Problem: Coping: Goal: Level of anxiety will decrease Outcome: Progressing   Problem: Elimination: Goal: Will not experience complications related to bowel motility Outcome: Progressing Goal: Will not experience complications related to urinary retention Outcome: Progressing   Problem: Safety: Goal: Ability to remain free from injury will improve Outcome: Progressing   

## 2020-03-01 NOTE — Discharge Instructions (Signed)
Ms. Faddis,  It was a pleasure taking care of you during your recent hospitalization. You were admitted to the hospital due to a fracture of your tibial plateau with injury to associated ligaments. You will discharge to the inpatient rehab facility for continued management of this condition.  Sincerely, Dr. Cato Mulligan, MD

## 2020-03-01 NOTE — Plan of Care (Signed)
Patient is s/p left tibial ORIF that was done yesterday on 11/11. Pain controlled with prn medications as ordered.Plan for discharge is CIR - insurance approved, just waiting for the bed. Keeping patient comfortable. No apparent distress at this time. Will continue to monitor and continue current POC.

## 2020-03-01 NOTE — Progress Notes (Signed)
Inpatient Rehabilitation-Admissions Coordinator   Received medical approval from attending service to admit to CIR Saturday, 03/02/20. Pt notified of bed offer and she has accepted. Reviewed insurance benefit letter and consent form. All questions answered. RN and [facility] team notified of plan for Saturday.   PM&R MD, Dr. Dagoberto Ligas will see the patient Saturday morning to confirm medical readiness. RN can call [phone] for report by noon.   Raechel Ache, OTR/L  Rehab Admissions Coordinator  [phone] 03/01/2020 1:06 PM

## 2020-03-01 NOTE — TOC Transition Note (Signed)
Transition of Care [facility]) - CM/SW Discharge Note   Patient Details  Name: [name] MRN: [id number] Date of Birth: 05/06/1955  [facility] CM/SW Contact:  Curlene Labrum, RN Phone Number: 03/01/2020, 4:29 PM   Clinical Narrative:    Case management spoke Claiborne Billings, CM with CIR and the patient will be admitted to CIR today.  I spoke with the patient at the bedside and she is agreeable to CIR admission this evening.   Final next level of care: [place] Barriers to Discharge: No Barriers Identified (waiting on PT evaluation)   Patient Goals and CMS Choice Patient states their goals for this hospitalization and ongoing recovery are:: Patient plans to discharge to a SNF facility. CMS Medicare.gov Compare Post Acute Care list provided to:: Patient Choice offered to / list presented to : Patient  Discharge Placement                       Discharge Plan and Services   Discharge Planning Services: CM Consult Post Acute Care Choice: [place]                               Social Determinants of Health (SDOH) Interventions     Readmission Risk Interventions Readmission Risk Prevention Plan 02/27/2020  Post Dischage Appt Complete  Medication Screening Complete  Transportation Screening Complete  Some recent data might be hidden

## 2020-03-01 NOTE — Progress Notes (Signed)
Subjective:  [name] is a 64 year old female with past medical history of morbid obesity, bipolar disorder, prior left femur fracture, alcohol use disorder and hypertension (on HCTZ) who presented to [facility] on the evening of 11/06 after a fall resulting in a left tibial plateau fracture, LCL injury, and fibular fracture with laboratory evidence of hyponatremia, now s/p ORIF on 29/52 without complication.  Overnight, no acute events.  This morning, patient reports feeling well with no acute complaints. She understands that she is approved for CIR placement and now awaiting bed availability. She has no further questions or concerns.  Objective:  Vital signs in last 24 hours: Vitals:   02/29/20 0816 02/29/20 1528 02/29/20 1930 03/01/20 0454  BP: (!) 126/59 (!) 147/64 (!) 152/69 (!) 158/77  Pulse: 83 79 69 63  Resp: 17 18 16 15   Temp: 97.9 F (36.6 C) 98.2 F (36.8 C) 97.6 F (36.4 C) 98.3 F (36.8 C)  TempSrc: Oral Oral Oral Oral  SpO2: 98% 100% 97% 99%  Weight:      Height:      On room air  Intake/Output Summary (Last 24 hours) at 03/01/2020 0724 Last data filed at 03/01/2020 0500 Gross per 24 hour  Intake 840 ml  Output 3500 ml  Net -2660 ml   Filed Weights   02/26/20 1806  Weight: 107.2 kg   Physical Exam Vitals and nursing note reviewed. Exam conducted with a chaperone present.  Constitutional:      General: She is not in acute distress.    Appearance: She is obese.  HENT:     Head: Normocephalic and atraumatic.  Eyes:     Extraocular Movements: Extraocular movements intact.     Conjunctiva/sclera: Conjunctivae normal.  Cardiovascular:     Rate and Rhythm: Normal rate and regular rhythm.     Pulses: Normal pulses.     Heart sounds: Normal heart sounds.  Pulmonary:     Breath sounds: Normal breath sounds.  Abdominal:     General: Abdomen is flat. Bowel sounds are normal.     Palpations: Abdomen is soft.     Tenderness: There is no abdominal  tenderness.  Musculoskeletal:     Cervical back: Normal range of motion and neck supple.     Comments: Patient's left lower extremity in knee brace.  Skin:    General: Skin is warm and dry.     Capillary Refill: Capillary refill takes less than 2 seconds.  Neurological:     General: No focal deficit present.     Mental Status: She is alert. Mental status is at baseline.     Comments: Patient reports intact sensation in left lower extremity and toes.  Psychiatric:        Mood and Affect: Mood normal.        Behavior: Behavior normal.        Thought Content: Thought content normal.        Judgment: Judgment normal.    CBC Latest Ref Rng & Units 02/29/2020 02/28/2020 02/27/2020  WBC 4.0 - 10.5 K/uL 7.0 7.2 9.0  Hemoglobin 12.0 - 15.0 g/dL 10.9(L) 10.9(L) 11.4(L)  Hematocrit 36 - 46 % 33.1(L) 33.0(L) 35.2(L)  Platelets 150 - 400 K/uL 243 247 241   BMP Latest Ref Rng & Units 03/01/2020 02/29/2020 02/28/2020  Glucose 70 - 99 mg/dL 108(H) 110(H) 99  BUN 8 - 23 mg/dL 8 6(L) 8  Creatinine 0.44 - 1.00 mg/dL 0.54 0.52 0.52  Sodium 135 - 145  mmol/L 136 136 135  Potassium 3.5 - 5.1 mmol/L 4.5 4.3 4.5  Chloride 98 - 111 mmol/L 98 101 97(L)  CO2 22 - 32 mmol/L 30 28 29   Calcium 8.9 - 10.3 mg/dL 9.0 8.7(L) 8.6(L)   25-OH Vitamin D - 18.03 Parathyroid hormone - 40  IMAGING: DG Tibia/Fibula Left  Result Date: 02/26/2020 CLINICAL DATA:  ORIF tibial plateau fracture. EXAM: LEFT TIBIA AND FIBULA - 2 VIEW; DG C-ARM 1-60 MIN FLUOROSCOPY TIME:  1 minute, 21 seconds (7.9 mGy) COMPARISON:  Left knee radiographs-02/25/2020; left knee CT-02/25/2020 FINDINGS: Seven spot intraoperative fluoroscopic images of the left knee are provided for review and demonstrate the C quell a of sideplate fixation of previously identified impacted medial tibial plateau fracture. Alignment appears near anatomic. Note is also made of the distal end of a intramedullary femoral rod, incompletely evaluated. No radiopaque foreign  body. IMPRESSION: Post sideplate fixation of medial tibial plateau fracture without evidence of complication. Electronically Signed   By: Sandi Mariscal M.D.   On: 02/26/2020 13:51   DG Knee Left Port  Result Date: 02/26/2020 CLINICAL DATA:  64 year old female postoperative films following ORIF. EXAM: PORTABLE LEFT KNEE - 1-2 VIEW COMPARISON:  Intraoperative images 12 45 hours today. FINDINGS: AP and cross-table lateral views. Pre-existing distal left femur intramedullary rod and distal interlocking cortical screws. New anterior/medial proximal tibia plate and screws. No adverse hardware features. No new osseous abnormality identified. IMPRESSION: Proximal left tibia ORIF with no adverse features. Electronically Signed   By: Genevie Ann M.D.   On: 02/26/2020 18:13   DG C-Arm 1-60 Min  Result Date: 02/26/2020 CLINICAL DATA:  ORIF tibial plateau fracture. EXAM: LEFT TIBIA AND FIBULA - 2 VIEW; DG C-ARM 1-60 MIN FLUOROSCOPY TIME:  1 minute, 21 seconds (7.9 mGy) COMPARISON:  Left knee radiographs-02/25/2020; left knee CT-02/25/2020 FINDINGS: Seven spot intraoperative fluoroscopic images of the left knee are provided for review and demonstrate the C quell a of sideplate fixation of previously identified impacted medial tibial plateau fracture. Alignment appears near anatomic. Note is also made of the distal end of a intramedullary femoral rod, incompletely evaluated. No radiopaque foreign body. IMPRESSION: Post sideplate fixation of medial tibial plateau fracture without evidence of complication. Electronically Signed   By: Sandi Mariscal M.D.   On: 02/26/2020 13:51   DG FEMUR MIN 2 VIEWS LEFT  Result Date: 02/28/2020 CLINICAL DATA:  Left femoral fracture. EXAM: LEFT FEMUR 2 VIEWS COMPARISON:  February 26, 2020. FINDINGS: Status post intramedullary rod fixation of left femur for treatment of old healed proximal left femoral fracture. No acute fracture or dislocation is noted involving the left femur. IMPRESSION: Status  post intramedullary rod fixation of old healed proximal left femoral fracture. Electronically Signed   By: Marijo Conception M.D.   On: 02/28/2020 13:47   Assessment/Plan:  Principal Problem:   Tibial plateau fracture, left Active Problems:   Tibial plateau fracture   Hyponatremia   Alcohol use disorder, moderate, dependence ([place])   Essential hypertension  [name] is a 64 year old female with past medical history of morbid obesity, bipolar disorder, prior left femur fracture, alcohol use disorder and hypertension (on HCTZ) who presented to [facility] on the evening of 11/06 after a fall resulting in a left tibial plateau fracture, LCL injury, and fibular fracture with laboratory evidence of hyponatremia, now s/p ORIF on 80/03 without complication.  #Left medial tibial plateau fracture, PCL/LCL injury, knee posterior capsular disruption s/p ORIF, stable Patient presented with fractures with ligament  injury following fall. Patient underwent operation on 74/04/2876 without complications. Patient required de-escalation of her pain medication regimen as she is pending discharge to CIR. Currently doing well on current regimen. -Tylenol 650mg  Q6H scheduled -Gabapentin 300mg  TID scheduled -Robaxin 500mg  Q6H PRN -Oxycodone 5-10mg  Q4H PRN -Continue PT/OT while admitted -Discharge to CIR  #HTN, chronic Patient's blood pressure elevated to 140s-150s in the setting of pain following surgery and discontinuation of HCTZ with transition to amlodipine.  -Discontinued HCTZ in setting of hyponatremia -Continue amlodipine 5mg  -Plan to follow-up with PCP for continued management of this chronic medical condition  #Vitamin D deficiency, chronic, stable Patient's 25-OH vitamin D level 18.03 with normal PTH level. 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 HCTZ was discontinued and sodium within normal limits for the past few days. -Daily BMP  #VTE ppx: Lovenox (will need to continue for 30 days postop) #Diet: Heart healthy #Code status: Full code #Bowel regimen: Senna twice daily #PT/OT recs: CIR, awaiting admission  Cato Mulligan, MD 03/01/2020, 7:24 AM Pager: [phone] After 5pm on weekdays and 1pm on weekends: On Call pager [phone]

## 2020-03-01 NOTE — H&P (Signed)
Physical Medicine and Rehabilitation Admission H&P    Chief Complaint  Patient presents with  . Leg Injury  : HPI: [name] is a 64 year old right-handed female with history of morbid obesity BMI 35.93, bipolar disorder, hypertension as well as tobacco/alcohol use, left femur fracture 2 years ago that required surgical intervention.  History taken from chart review and patient.  Patient lives with spouse.  1 level home ramped entrance.  Independent with assistive device.  Patient able to complete ADLs modified independent.  She presented on 02/25/2020 after fall, when her knee gave out from under her.  Denies LOC.  Admission chemistries hemoglobin 13.1, sodium 124, glucose 123.  X-rays and imaging revealed left medial tibial plateau fracture.  Underwent ORIF of left tibial plateau fracture on 02/26/2020 per Dr. Doreatha Martin..  Placed on Lovenox for DVT prophylaxis.  Nonweightbearing left lower extremity with left hinged knee brace locked in 5-10 degree flexion.  Hospital course further complicated by acute blood loss anemia, 10.9 and monitored.  Therapy evaluations completed with recommendations of inpatient rehab services.  Please see preadmission assessment from earlier today as well.  Review of Systems  Constitutional: Negative for chills and fever.  HENT: Negative for hearing loss.   Eyes: Negative for blurred vision and double vision.  Respiratory: Negative for cough and shortness of breath.   Cardiovascular: Negative for chest pain, palpitations and leg swelling.  Gastrointestinal: Positive for constipation. Negative for heartburn, nausea and vomiting.  Genitourinary: Negative for dysuria, flank pain and hematuria.  Musculoskeletal: Positive for joint pain and myalgias.  Skin: Negative for rash.  Neurological: Positive for tingling and focal weakness. Negative for loss of consciousness.  Psychiatric/Behavioral: The patient has insomnia.        Bipolar  All other systems reviewed and are  negative.  Past medical history: Bipolar disorder, hypertension, morbid obesity Past Surgical History:  Procedure Laterality Date  . ORIF TIBIA PLATEAU Left 02/26/2020   Procedure: OPEN REDUCTION INTERNAL FIXATION (ORIF) TIBIAL PLATEAU;  Surgeon: Shona Needles, MD;  Location: [facility];  Service: Orthopedics;  Laterality: Left;   Medical history: No pertinent family history of trauma Social History:  reports that she has quit smoking. She has never used smokeless tobacco. She reports current alcohol use. She reports that she does not use drugs. Allergies: No Known Allergies Medications Prior to Admission  Medication Sig Dispense Refill  . diclofenac (VOLTAREN) 75 MG EC tablet Take 75 mg by mouth 2 (two) times daily.    . hydrochlorothiazide (HYDRODIURIL) 25 MG tablet Take 25 mg by mouth daily.      Drug Regimen Review Drug regimen was reviewed and remains appropriate with no significant issues identified  Home: Home Living Family/patient expects to be discharged to:: Private residence Living Arrangements: Spouse/significant other Available Help at Discharge: Family Type of Home: House Home Access: Ramped entrance [place]: One level Bathroom Shower/Tub: Chiropodist: Standard (low toilet) Bathroom Accessibility: Yes Home Equipment: Environmental consultant - 2 wheels, Hand held shower head, Adaptive equipment, Cane - quad Adaptive Equipment: Other (Comment) (leg lifter) Additional Comments: Ramp being set-up today   Functional History: Prior Function Level of Independence: Independent with assistive device(s) Comments: Pt reports being Modified Independent using RW or quad cane for mobility. Pt able to complete ADLs, IADLs in the home. Limited by back pain. Uses electric shopping cart in store  Functional Status:  Mobility: Bed Mobility Overal bed mobility: Needs Assistance Bed Mobility: Supine to Sit, Sit to Supine Supine to  sit: Min assist, HOB elevated Sit to supine:  Mod assist General bed mobility comments: Min A using gait belt as leg lifter and HOB slightly elevated. Heavy use of bed rail.   Pt required moderate assistance to manage LLE to move back into bed.  Pt placed in trendelenberg position to boost to head of bed. Increased time to position. Transfers Overall transfer level: Needs assistance Equipment used: 1 person hand held assist (assisted L side and leaning to the R with forearm supported on back of recliner.) Transfers: Sit to/from Stand Sit to Stand: Max assist, +2 physical assistance, +2 safety/equipment Squat pivot transfers: Mod assist, +2 safety/equipment  Lateral/Scoot Transfers: Min guard General transfer comment: Pt able to stand twice from elevate surface of bed.  Assistance to boost into standing with use of back of recliner on R side to pull and support weight on R forarm.  Pt stood for 10-20 sec each trial. Ambulation/Gait Ambulation/Gait assistance:  (NT)    ADL: ADL Overall ADL's : Needs assistance/impaired Eating/Feeding: Independent, Sitting Grooming: Set up, Sitting Upper Body Bathing: Minimal assistance, Sitting Lower Body Bathing: Maximal assistance, Sitting/lateral leans Upper Body Dressing : Set up, Sitting Lower Body Dressing: Total assistance, Sitting/lateral leans, Sit to/from stand Lower Body Dressing Details (indicate cue type and reason): Total A to don socks, may benefit from AE education during next session Toilet Transfer: Maximal assistance, +2 for physical assistance, +2 for safety/equipment, Squat-pivot, BSC (drop arm BSC) Toilet Transfer Details (indicate cue type and reason): Max A x 2 for squat pivot/scooting transfer to drop arm BSC, cueing for technique and pt with improving problem solving  Toileting- Clothing Manipulation and Hygiene: Total assistance, Sit to/from stand, Sitting/lateral lean, +2 for physical assistance, +2 for safety/equipment Toileting - Clothing Manipulation Details (indicate cue  type and reason): Total A for peri care after BM with slight squat stance. x 2 assist needed. Pt reports difficulty reaching and uses a bidet at home General ADL Comments: Limited by pain but motivated to return to independence and attempt all tasks  Cognition: Cognition Overall Cognitive Status: Within Functional Limits for tasks assessed Orientation Level: Oriented X4 Cognition Arousal/Alertness: Awake/alert Behavior During Therapy: WFL for tasks assessed/performed, Anxious Overall Cognitive Status: Within Functional Limits for tasks assessed General Comments: Anxiety regarding pain with therapy, looks for feedback, implements safety techniques after cueing   Physical Exam: Blood pressure (!) 150/71, pulse 68, temperature 97.8 F (36.6 C), temperature source Oral, resp. rate 17, height 5\' 8"  (1.727 m), weight 107.2 kg, SpO2 97 %. Physical Exam Vitals reviewed.  Constitutional:      Appearance: She is obese.  HENT:     Head: Normocephalic and atraumatic.     Right Ear: External ear normal.     Left Ear: External ear normal.     Nose: Nose normal.  Eyes:     General:        Right eye: No discharge.        Left eye: No discharge.     Extraocular Movements: Extraocular movements intact.  Cardiovascular:     Rate and Rhythm: Normal rate and regular rhythm.  Pulmonary:     Effort: Pulmonary effort is normal. No respiratory distress.     Breath sounds: No stridor.  Abdominal:     General: Abdomen is flat. Bowel sounds are normal. There is no distension.  Musculoskeletal:     Cervical back: Normal range of motion and neck supple.     Comments: Left knee with edema  and tenderness  Skin:    Comments: Left knee incision site CDI  Neurological:     Mental Status: She is alert and oriented to person, place, and time.     Comments: Alert Motor: Bilateral upper extremities: 5/5 proximal distal Right lower extremity: 4+/5 proximal distal Left lower extremity: Hip flexion to/5 (some  pain inhibition), knee brace, ankle dorsiflexion 4+/5  Psychiatric:        Mood and Affect: Mood normal.        Behavior: Behavior normal.        Thought Content: Thought content normal.     Results for orders placed or performed during the hospital encounter of 02/24/20 (from the past 48 hour(s))  Glucose, capillary     Status: Abnormal   Collection Time: 02/28/20  8:29 PM  Result Value Ref Range   Glucose-Capillary 109 (H) 70 - 99 mg/dL    Comment: Glucose reference range applies only to samples taken after fasting for at least 8 hours.  Basic metabolic panel     Status: Abnormal   Collection Time: 02/29/20  1:49 AM  Result Value Ref Range   Sodium 136 135 - 145 mmol/L   Potassium 4.3 3.5 - 5.1 mmol/L   Chloride 101 98 - 111 mmol/L   CO2 28 22 - 32 mmol/L   Glucose, Bld 110 (H) 70 - 99 mg/dL    Comment: Glucose reference range applies only to samples taken after fasting for at least 8 hours.   BUN 6 (L) 8 - 23 mg/dL   Creatinine, Ser 0.52 0.44 - 1.00 mg/dL   Calcium 8.7 (L) 8.9 - 10.3 mg/dL   GFR, Estimated >60 >60 mL/min    Comment: (NOTE) Calculated using the CKD-EPI Creatinine Equation (2021)    Anion gap 7 5 - 15    Comment: Performed at [facility] [address], [place], [place] [address]  CBC     Status: Abnormal   Collection Time: 02/29/20  1:49 AM  Result Value Ref Range   WBC 7.0 4.0 - 10.5 K/uL   RBC 3.52 (L) 3.87 - 5.11 MIL/uL   Hemoglobin 10.9 (L) 12.0 - 15.0 g/dL   HCT 33.1 (L) 36 - 46 %   MCV 94.0 80.0 - 100.0 fL   MCH 31.0 26.0 - 34.0 pg   MCHC 32.9 30.0 - 36.0 g/dL   RDW 13.1 11.5 - 15.5 %   Platelets 243 150 - 400 K/uL   nRBC 0.0 0.0 - 0.2 %    Comment: Performed at [facility], [place] [address] [place], [place] [address]  Glucose, capillary     Status: Abnormal   Collection Time: 02/29/20  6:48 AM  Result Value Ref Range   Glucose-Capillary 110 (H) 70 - 99 mg/dL    Comment: Glucose reference range applies only to samples taken  after fasting for at least 8 hours.  Glucose, capillary     Status: Abnormal   Collection Time: 02/29/20  8:14 PM  Result Value Ref Range   Glucose-Capillary 110 (H) 70 - 99 mg/dL    Comment: Glucose reference range applies only to samples taken after fasting for at least 8 hours.  Basic metabolic panel     Status: Abnormal   Collection Time: 03/01/20  2:35 AM  Result Value Ref Range   Sodium 136 135 - 145 mmol/L   Potassium 4.5 3.5 - 5.1 mmol/L   Chloride 98 98 - 111 mmol/L   CO2  30 22 - 32 mmol/L   Glucose, Bld 108 (H) 70 - 99 mg/dL    Comment: Glucose reference range applies only to samples taken after fasting for at least 8 hours.   BUN 8 8 - 23 mg/dL   Creatinine, Ser 0.54 0.44 - 1.00 mg/dL   Calcium 9.0 8.9 - 10.3 mg/dL   GFR, Estimated >60 >60 mL/min    Comment: (NOTE) Calculated using the CKD-EPI Creatinine Equation (2021)    Anion gap 8 5 - 15    Comment: Performed at [facility] [address] [place], [place] [address]   No results found.  Medical Problem List and Plan: 1.  Decreased functional mobility secondary to left tibial plateau fracture.  Status post ORIF 02/25/2020.  Nonweightbearing left lower extremity with hinged knee brace locked in 5 to 10 degree flexion.  Ortho does not want knee extended fully  -patient may not shower  -ELOS/Goals: 12-15 days/supervision/min a  Admit to CIR 2.  Antithrombotics: -DVT/anticoagulation: Lovenox.  Vascular studies ordered.  -antiplatelet therapy: N/A 3. Pain Management: Neurontin 300 mg 3 times daily, oxycodone and Robaxin as needed  Monitor with increased exertion 4. Mood: Provide emotional support  -antipsychotic agents: N/A 5. Neuropsych: This patient is capable of making decisions on her own behalf. 6. Skin/Wound Care: Routine skin checks 7. Fluids/Electrolytes/Nutrition: Routine in and outs.  CMP ordered. 8.  Acute blood loss anemia.    CBC ordered. 9.  Hypertension.  Norvasc 5 mg daily.    Monitor with  increased mobility. 10.  History of alcohol tobacco use.  Provide counseling 11.  Morbid obesity.  BMI 35.93.  Encourage weight loss.  Dietary follow-up  Cathlyn Parsons, PA-C 03/01/2020  I have personally performed a face to face diagnostic evaluation, including, but not limited to relevant history and physical exam findings, of this patient and developed relevant assessment and plan.  Additionally, I have reviewed and concur with the physician assistant's documentation above.  Delice Lesch, MD, ABPMR  The patient's status has not changed. Any changes from the pre-admission screening or documentation from the acute chart are noted above.   Delice Lesch, MD, ABPMR

## 2020-03-01 NOTE — IPOC Note (Signed)
Individualized overall Plan of Care [facility]) Patient Details Name: [name] MRN: [id number] DOB: 24-Jan-1956  Admitting Diagnosis: Left medial tibial plateau fracture  Hospital Problems: Principal Problem:   Left medial tibial plateau fracture Active Problems:   Post-operative pain   Benign essential HTN   Pruritus     Functional Problem List: Nursing Endurance, Medication Management, Pain, Safety, Skin Integrity  PT Safety, Endurance, Motor, Pain, Balance  OT Balance, Safety, Sensory, Endurance, Edema, Motor, Pain  SLP    TR         Basic ADL's: OT Grooming, Bathing, Dressing, Toileting     Advanced  ADL's: OT       Transfers: PT Bed Mobility, Bed to Chair, Car, Chief Operating Officer: PT Ambulation, Emergency planning/management officer     Additional Impairments: OT None  SLP        TR      Anticipated Outcomes Item Anticipated Outcome  Self Feeding n/a  Swallowing      Basic self-care  Mod I  Insurance underwriter Transfers Supervision  Bowel/Bladder     Transfers  MOD i BASIC, MIN ASSIST CAR  Locomotion  MIN ASSIST SHORT DISTANCE HOUSEHOLD FOR BR ACCESS  Communication     Cognition     Pain  pt to report pain as less than a 3/10  Safety/Judgment  pt to verbalize and demonstrate understanding of safety precautions   Therapy Plan: PT Intensity: Minimum of 1-2 x/day ,45 to 90 minutes PT Frequency: 5 out of 7 days PT Duration Estimated Length of Stay: 12-14 dAYS OT Intensity: Minimum of 1-2 x/day, 45 to 90 minutes OT Frequency: 5 out of 7 days OT Duration/Estimated Length of Stay: 10-14 days      Team Interventions: Nursing Interventions Patient/Family Education, Pain Management, Medication Management, Skin Care/Wound Management, Discharge Planning, Psychosocial Support  PT interventions Ambulation/gait training, DME/adaptive equipment instruction, UE/LE Strength taining/ROM, Wheelchair propulsion/positioning, Human resources officer, Discharge planning, Pain management, Therapeutic Activities, Disease management/prevention, Functional mobility training, Patient/family education, Therapeutic Exercise  OT Interventions Balance/vestibular training, Disease mangement/prevention, Self Care/advanced ADL retraining, Therapeutic Exercise, Wheelchair propulsion/positioning, DME/adaptive equipment instruction, Pain management, Skin care/wound managment, UE/LE Strength taining/ROM, Community reintegration, Barrister's clerk education, Functional electrical stimulation, UE/LE Coordination activities, Discharge planning, Functional mobility training, Psychosocial support, Therapeutic Activities  SLP Interventions    TR Interventions    SW/CM Interventions Discharge Planning, Psychosocial Support, Patient/Family Education   Barriers to Discharge MD  Medical stability, Wound care and Weight  Nursing      PT Home environment access/layout, Weight, Weight bearing restrictions bathroom not bari wc accessible  OT      SLP      SW Decreased caregiver support Husband can not do much lifting if needed   Team Discharge Planning: Destination: PT-Home ,OT- Home , SLP-  Projected Follow-up: PT-Home health PT, OT-  None, SLP-  Projected Equipment Needs: PT-To be determined, OT- 3 in 1 bedside comode, SLP-  Equipment Details: PT- , OT-  Patient/family involved in discharge planning: PT- Patient,  OT-Patient, SLP-   MD ELOS: 10-14 days. Medical Rehab Prognosis:  Good Assessment: 64 year old right-handed female with history of morbid obesity BMI 35.93, bipolar disorder, hypertension as well as tobacco/alcohol use, left femur fracture 2 years ago that required surgical intervention. She presented on 02/25/2020 after fall, when her knee gave out from under her.  Denies LOC.  Admission chemistries hemoglobin 13.1, sodium 124, glucose 123.  X-rays and imaging revealed left  medial tibial plateau fracture.  Underwent ORIF of left tibial plateau  fracture on 02/26/2020 per Dr. Doreatha Martin. Nonweightbearing left lower extremity with left hinged knee brace locked in 5-10 degree flexion.  Hospital course further complicated by acute blood loss anemia,monitored.  Patient with resulting functional deficits with mobility, endurance, sefl-care.  Will set goals for mod I/supervision with PT/OT.  Due to the current state of emergency, patients may not be receiving their 3-hours of Medicare-mandated therapy.  See Team Conference Notes for weekly updates to the plan of care

## 2020-03-01 NOTE — Progress Notes (Signed)
Inpatient Rehabilitation Medication Review by a Pharmacist  A complete drug regimen review was completed for this patient to identify any potential clinically significant medication issues.  Clinically significant medication issues were identified:  no   Type of Medication Issue Identified Description of Issue Urgent (address now) Non-Urgent (address on AM team rounds) Plan Plan Accepted by Provider? (Yes / No / Pending AM Rounds)  Drug Interaction(s) (clinically significant)       Duplicate Therapy       Allergy       No Medication Administration End Date       Incorrect Dose       Additional Drug Therapy Needed       Other  Home med not yet resumed:  Voltaren None-urgent Resume per MD's discretion      Time spent performing this drug regimen review (minutes):  10 min  Vianey Caniglia D. Mina Marble, PharmD, BCPS, [place] 03/01/2020, 9:47 PM

## 2020-03-01 NOTE — Progress Notes (Signed)
Inpatient Rehabilitation-Admissions Coordinator   *IP Rehab bed available today. Will admit this patient to CIR today. MD notified and has provided medical clearance for today.

## 2020-03-02 ENCOUNTER — Inpatient Hospital Stay (HOSPITAL_COMMUNITY): Payer: BC Managed Care – PPO

## 2020-03-02 DIAGNOSIS — S82132S Displaced fracture of medial condyle of left tibia, sequela: Secondary | ICD-10-CM

## 2020-03-02 NOTE — Evaluation (Signed)
Physical Therapy Assessment and Plan  Patient Details  Name: [name] MRN: [id number] Date of Birth: 05-15-1955  PT Diagnosis: Pain in joint Rehab Potential: Good ELOS: 12-14 dAYS   Today's Date: 03/02/2020 PT Individual Time: 5573-2202 and 1415-1430 PT Individual Time Calculation (min): 45 min  And 15 min  Hospital Problem: Principal Problem:   Left medial tibial plateau fracture   Past Medical History: No past medical history on file. Past Surgical History:  Past Surgical History:  Procedure Laterality Date   ORIF TIBIA PLATEAU Left 02/26/2020   Procedure: OPEN REDUCTION INTERNAL FIXATION (ORIF) TIBIAL PLATEAU;  Surgeon: Shona Needles, MD;  Location: [place];  Service: Orthopedics;  Laterality: Left;    Assessment & Plan Clinical Impression:[name] is a 64 year old right-handed female with history of morbid obesity BMI 35.93, bipolar disorder, hypertension as well as tobacco/alcohol use, left femur fracture 2 years ago that required surgical intervention.  History taken from chart review and patient.  Patient lives with spouse.  1 level home ramped entrance.  Independent with assistive device.  Patient able to complete ADLs modified independent.  She presented on 02/25/2020 after fall, when her knee gave out from under her.  Denies LOC.  Admission chemistries hemoglobin 13.1, sodium 124, glucose 123.  X-rays and imaging revealed left medial tibial plateau fracture.  Underwent ORIF of left tibial plateau fracture on 02/26/2020 per Dr. Doreatha Martin..  Placed on Lovenox for DVT prophylaxis.  Nonweightbearing left lower extremity with left hinged knee brace locked in 5-10 degree flexion.  Hospital course further complicated by acute blood loss anemia, 10.9 and monitored.  Therapy evaluations completed with recommendations of inpatient rehab services   Patient transferred to CIR on 03/01/2020 .   Patient currently requires max with mobility secondary to muscle weakness and muscle joint  tightness, decreased cardiorespiratoy endurance and decreased standing balance and difficulty maintaining precautions.  Prior to hospitalization, patient was modified independent  with mobility and lived with Spouse in a House home.  Home access is  Ramped entrance.  Patient will benefit from skilled PT intervention to maximize safe functional mobility, minimize fall risk and decrease caregiver burden for planned discharge home with 24 hour assist.  Anticipate patient will benefit from follow up [facility] at discharge.  PT - End of Session Activity Tolerance: Tolerates 30+ min activity with multiple rests Endurance Deficit: Yes PT Assessment Rehab Potential (ACUTE/IP ONLY): Good PT Barriers to Discharge: Home environment access/layout;Weight;Weight bearing restrictions PT Barriers to Discharge Comments: bathroom not bari wc accessible PT Patient demonstrates impairments in the following area(s): Safety;Endurance;Motor;Pain;Balance PT Transfers Functional Problem(s): Bed Mobility;Bed to Chair;Car;Furniture PT Locomotion Functional Problem(s): Ambulation;Wheelchair Mobility PT Plan PT Intensity: Minimum of 1-2 x/day ,45 to 90 minutes PT Frequency: 5 out of 7 days PT Duration Estimated Length of Stay: 12-14 dAYS PT Treatment/Interventions: Ambulation/gait training;DME/adaptive equipment instruction;UE/LE Strength taining/ROM;Wheelchair propulsion/positioning;Balance/vestibular training;Discharge planning;Pain management;Therapeutic Activities;Disease management/prevention;Functional mobility training;Patient/family education;Therapeutic Exercise PT Transfers Anticipated Outcome(s): MOD i BASIC, MIN ASSIST CAR PT Locomotion Anticipated Outcome(s): MIN ASSIST SHORT DISTANCE HOUSEHOLD FOR BR ACCESS PT Recommendation Follow Up Recommendations: Home health PT Patient destination: Home Equipment Recommended: To be determined   PT Evaluation Precautions/Restrictions Precautions Precautions:  Knee Precaution Comments: Bledsoe locked 15 degree flexion Other Brace: L knee hinged brace locked in 5-10' flexion (ortho does not want knee extended fully) Restrictions LLE Weight Bearing: Non weight bearing Other Position/Activity Restrictions: LLE General   Vital SignsTherapy Vitals Temp: 97.8 F (36.6 C) Temp Source: Oral Pulse Rate: 73 BP: 135/63  Pain Pain Assessment Pain Scale: 0-10 Pain Score: 8  Pain Type: Surgical pain Pain Location: Knee Pain Orientation: Left Pain Descriptors / Indicators: Aching Pain Frequency: Constant Pain Onset: On-going Patients Stated Pain Goal: 4 Pain Intervention(s): Medication (See eMAR) Home Living/Prior Functioning Home Living Available Help at Discharge: Family;Available 24 hours/day Type of Home: House Home Access: Ramped entrance Home Layout: One level Bathroom Shower/Tub: Chiropodist: Standard Bathroom Accessibility: Yes  Lives With: Spouse Prior Function Level of Independence: Requires assistive device for independence  Able to Take Stairs?: No Driving: Yes Vocation: Full time employment Vocation Requirements: works from home Comments: Pt reports being Modified Independent using RW or quad cane for mobility. Pt able to complete ADLs, IADLs in the home. Limited by back pain. Uses electric shopping cart in store Vision/Perception     Cognition Overall Cognitive Status: Within Functional Limits for tasks assessed Arousal/Alertness: Awake/alert Orientation Level: Oriented X4 Attention: Alternating Memory: Appears intact Sensation Sensation Light Touch: Impaired by gross assessment Light Touch Impaired Details: Impaired LLE (slightly decreased sesitivity to lt touch anterior aspect of lower limb/tibia) Hot/Cold: Not tested Proprioception: Appears Intact Coordination Gross Motor Movements are Fluid and Coordinated: No Fine Motor Movements are Fluid and Coordinated: Yes (UEs) Coordination and Movement  Description: limited by LLE pain and NWB status Finger Nose Finger Test: intact Motor  Motor Motor: Within Functional Limits Motor - Skilled Clinical Observations: strength assessment limited by pain, moves UEs and RLE in normal fashion and able to sustaing light resistance, pain limits tolerance   Trunk/Postural Assessment  Cervical Assessment Cervical Assessment: Within Functional Limits Thoracic Assessment Thoracic Assessment: Within Functional Limits Lumbar Assessment Lumbar Assessment: Within Functional Limits Postural Control Postural Control: Within Functional Limits (in sitting, unable to stand)  Balance Balance Balance Assessed: Yes Static Sitting Balance Static Sitting - Balance Support: Feet supported Static Sitting - Level of Assistance: 7: Independent Dynamic Sitting Balance Dynamic Sitting - Balance Support: During functional activity Dynamic Sitting - Level of Assistance: 5: Stand by assistance Dynamic Sitting - Balance Activities: Reaching across midline;Lateral lean/weight shifting;Forward lean/weight shifting Extremity Assessment  RUE Assessment RUE Assessment: Within Functional Limits   RLE Assessment RLE Assessment: Within Functional Limits LLE Assessment LLE Assessment: Exceptions to [facility] Passive Range of Motion (PROM) Comments: Bledsoe brace locked 5-10degrees flexion Active Range of Motion (AROM) Comments: ankle WFL General Strength Comments: with limb supported pt able to abd/add at hip , DF/PF ankle thru full ROM, unable to test other due to pain  Care Tool Care Tool Bed Mobility Roll left and right activity   Roll left and right assist level: Moderate Assistance - Patient 50 - 74%    Sit to lying activity   Sit to lying assist level: Moderate Assistance - Patient 50 - 74%    Lying to sitting edge of bed activity   Lying to sitting edge of bed assist level: Moderate Assistance - Patient 50 - 74%     Care Tool Transfers Sit to stand transfer  Sit to stand activity did not occur: Safety/medical concerns      Chair/bed transfer   Chair/bed transfer assist level: 2 Pension scheme manager transfer   Assist Level: 2 Production assistant, radio transfer activity did not occur: Safety/medical concerns        Care Tool Locomotion Ambulation Ambulation activity did not occur: Safety/medical concerns        Walk 10 feet activity Walk 10 feet activity did not occur:  Safety/medical concerns       Walk 50 feet with 2 turns activity Walk 50 feet with 2 turns activity did not occur: Safety/medical concerns      Walk 150 feet activity Walk 150 feet activity did not occur: Safety/medical concerns      Walk 10 feet on uneven surfaces activity Walk 10 feet on uneven surfaces activity did not occur: Safety/medical concerns      Stairs Stair activity did not occur: Safety/medical concerns        Walk up/down 1 step activity Walk up/down 1 step or curb (drop down) activity did not occur: Safety/medical concerns     Walk up/down 4 steps activity did not occuR: Safety/medical concerns  Walk up/down 4 steps activity      Walk up/down 12 steps activity Walk up/down 12 steps activity did not occur: Safety/medical concerns      Pick up small objects from floor Pick up small object from the floor (from standing position) activity did not occur: Safety/medical concerns      Wheelchair Will patient use wheelchair at discharge?: Yes Type of Wheelchair: Manual Wheelchair activity did not occur: Safety/medical concerns      Wheel 50 feet with 2 turns activity Wheelchair 50 feet with 2 turns activity did not occur: Safety/medical concerns    Wheel 150 feet activity Wheelchair 150 feet activity did not occur: Safety/medical concerns      Refer to Care Plan for Long Term Goals  SHORT TERM GOAL WEEK 1 PT Short Term Goal 1 (Week 1): supine to/from sit w/supervision PT Short Term Goal 2 (Week 1): sliding board transfers w/level surfaces  w/min assist of 1 PT Short Term Goal 3 (Week 1): sit to stand in parallel bars or w/bari walker w/mod assist NWBing LLE PT Short Term Goal 4 (Week 1): pt will maintain NWBing in standing w/LRAD x >1 min  Recommendations for other services: None   Skilled Therapeutic Intervention Evaluation completed (see details above and below) with education on PT POC and goals and individual treatment initiated with focus on functional mobility/transfers, LE strength, dynamic sitting balance/coordination, ADLs, and improved endurance with activity Pt initially supine, very anxious and upset stating she thought she was on Purewick and accidentally urinated large amount in bed.  Therapist provided assurance and support.  Then states she would like to use BSC. Pt did not have [place] in room. Therapist obtained bariatric BSC and delivered to pt room.  Pt very upset and states she was not able to hold urine and had further soiled her bed/brief.  Nursing notified and therapist agreed to return later in day to complete evaluation. Therapist returned to complete eval, pt requesting assistance to [facility] stating she needed to urinate.   Therapist adjusted bledsoe brace which was very low and educated pt on positioning.  Pt w/much pain w/any movement of LE.   Supine to sit w/mod assist, assist to manage LLE, use of rail, painful. In sitting LLE propped w/floormat/folded Instructed pt w/use of slidingboard and technique for transfer using BSC, no droparm available in bari size. Pt able to lean to L for palcement of board/total assist of therapist. Bed to BSC max assist of 1, cues for sequencing and safety, cues to lean forward and not back to prevent sliding/safety risk, therapist supports LLE to ensure NWB. On commode, pt able to boost for removal of board and lowering of pants and again for raising pants and placing board, this takes mult efforts due to obesity/pt fatigue.  This requires second person assist for raising  pants/stabiliing BSC, stabilizing board w/transfer Pt continent of urine and declines hygiene stating she is too tired and will get assist when back in the bed.  Sit to supine w/max assist for LLE management. Once supine, pt able to scoot w/rails and mod assist. Bledsoe brace again required repositioning due to sliding down at leas 1.5in during mobility. Ice applied to LLE/knee for pain control.  Discussed pt goals, scheduling, expectations to prepare for dc to home, team conference, and pt performance this session as pt very anxious and concerned w/limited ability to participate.  Assured pt/support provided, pt assured that her max effort in session was acceptable and that process/performance would improve w/IPR services.   Pt left supine w/rails up x 3, alarm set, bed in lowest position, and needs in reach.    Mobility Bed Mobility Bed Mobility: Rolling Right;Rolling Left;Sitting - Scoot to Edge of Bed;Scooting to [employer];Sit to Supine;Supine to Sit Rolling Right: Minimal Assistance - Patient > 75% Rolling Left: Minimal Assistance - Patient > 75% Supine to Sit: Moderate Assistance - Patient 50-74% Sitting - Scoot to Edge of Bed: Moderate Assistance - Patient 50-74% Sit to Supine: Moderate Assistance - Patient 50-74% Scooting to HOB: Moderate Assistance - Patient 50-74% Transfers Transfers: Lateral/Scoot Transfers Lateral/Scoot Transfers: 2 Press photographer (Assistive device): Other (Comment) (sliding board) Locomotion  Gait Ambulation: No Gait Gait: No Stairs / Additional Locomotion Stairs: No Wheelchair Mobility Wheelchair Mobility: No   Discharge Criteria: Patient will be discharged from PT if patient refuses treatment 3 consecutive times without medical reason, if treatment goals not met, if there is a change in medical status, if patient makes no progress towards goals or if patient is discharged from hospital.  The above assessment, treatment plan, treatment alternatives  and goals were discussed and mutually agreed upon: by patient Callie Fielding, [place] 03/02/2020, 5:14 PM

## 2020-03-02 NOTE — Progress Notes (Addendum)
[facility] PHYSICAL MEDICINE & REHABILITATION PROGRESS NOTE   Subjective/Complaints:  Pt reports itching- benadryl helps- ordered it last night for 25 mg q6 hours prn- pt likes current dosing.   Feels constipate-d LBM yesterday but small- needs to go more-  Pain 7/10- but JUST got pain meds.  Pees a lot- feels bad about asking nursing to go to bathroom- liked [place]wick- explained don't do here. She knows.   ROS:  Pt denies SOB, abd pain, CP, N/V/C/D, and vision changes   Objective:   No results found. Recent Labs    02/29/20 0149  WBC 7.0  HGB 10.9*  HCT 33.1*  PLT 243   Recent Labs    02/29/20 0149 03/01/20 0235  NA 136 136  K 4.3 4.5  CL 101 98  CO2 28 30  GLUCOSE 110* 108*  BUN 6* 8  CREATININE 0.52 0.54  CALCIUM 8.7* 9.0    Intake/Output Summary (Last 24 hours) at 03/02/2020 1415 Last data filed at 03/02/2020 0914 Gross per 24 hour  Intake 720 ml  Output 475 ml  Net 245 ml        Physical Exam: Vital Signs Blood pressure (!) 142/63, pulse 77, temperature 97.9 F (36.6 C), temperature source Oral, resp. rate 20, height 5\' 8"  (1.727 m), weight (!) 143.2 kg, SpO2 97 %.   Physical Exam Vitals reviewed.  Constitutional: pt awake, , BMI 36- sitting up in bed- RN in room for visit, NAD.  HENT: conjugate gaze Cardiovascular: RRR  Pulmonary: CTA B/L- no W/R/R- good air movement.  Abdominal: Soft, NT, ND, (+)BS  Musculoskeletal:     Cervical back: Normal range of motion and neck supple.     Comments: Left knee with edema and tenderness  Skin:    Comments: Left knee incision site CDI  Neurological: ox3    Comments: Alert Motor: Bilateral upper extremities: 5/5 proximal distal Right lower extremity: 4+/5 proximal distal Left lower extremity: Hip flexion to/5 (some pain inhibition), knee brace, ankle dorsiflexion 4+/5  Psychiatric:    appropriate, a little nervous about frequent voids     Assessment/Plan: 1. Functional deficits which require 3+  hours per day of interdisciplinary therapy in a comprehensive inpatient rehab setting.  Physiatrist is providing close team supervision and 24 hour management of active medical problems listed below.  Physiatrist and rehab team continue to assess barriers to discharge/monitor patient progress toward functional and medical goals  Care Tool:  Bathing    Body parts bathed by patient: Right arm, Left arm, Chest, Abdomen, Front perineal area, Left upper leg, Right upper leg, Face   Body parts bathed by helper: Buttocks, Right lower leg, Left lower leg     Bathing assist Assist Level: Minimal Assistance - Patient > 75%     Upper Body Dressing/Undressing Upper body dressing   What is the patient wearing?: Dress    Upper body assist Assist Level: Set up assist    Lower Body Dressing/Undressing Lower body dressing    Lower body dressing activity did not occur:  (Pt had just completed hygiene with NT prior to OT arrival.)       Lower body assist       Toileting Toileting    Toileting assist       Transfers Chair/bed transfer  Transfers assist           Locomotion Ambulation   Ambulation assist              Walk 10 feet activity  Assist           Walk 50 feet activity   Assist           Walk 150 feet activity   Assist           Walk 10 feet on uneven surface  activity   Assist           Wheelchair     Assist               Wheelchair 50 feet with 2 turns activity    Assist            Wheelchair 150 feet activity     Assist          Blood pressure (!) 142/63, pulse 77, temperature 97.9 F (36.6 C), temperature source Oral, resp. rate 20, height 5\' 8"  (1.727 m), weight (!) 143.2 kg, SpO2 97 %.  Medical Problem List and Plan: 1.  Decreased functional mobility secondary to left tibial plateau fracture.  Status post ORIF 02/25/2020.  Nonweightbearing left lower extremity with hinged knee brace  locked in 5 to 10 degree flexion.  Ortho does not want knee extended fully  11/13- weight went from 107 kg to 143 kg in 4 days- will check bed scale             -patient may not shower             -ELOS/Goals: 12-15 days/supervision/min a             Admit to CIR 2.  Antithrombotics: -DVT/anticoagulation: Lovenox.  Vascular studies ordered.             -antiplatelet therapy: N/A 3. Pain Management: Neurontin 300 mg 3 times daily, oxycodone and Robaxin as needed  11/13- pain usually controlled with current regimen- con't regimen             Monitor with increased exertion 4. Mood: Provide emotional support             -antipsychotic agents: N/A 5. Neuropsych: This patient is capable of making decisions on her own behalf. 6. Skin/Wound Care: Routine skin checks 7. Fluids/Electrolytes/Nutrition: Routine in and outs.  CMP ordered. 8.  Acute blood loss anemia.               CBC ordered. 9.  Hypertension.  Norvasc 5 mg daily.   11/13- BP controlled con't regimen              Monitor with increased mobility. 10.  History of alcohol tobacco use.  Provide counseling 11.  Morbid obesity.  BMI 35.93.  Encourage weight loss.  Dietary follow-up  11/13- we weighed pt- her BMI was 36- now 48- something wrong with weight- will have nursing weigh again.   12. Itching  11/13- started benadryl 25 mg q6 hours prn- con't  LOS: 1 days A FACE TO FACE EVALUATION WAS PERFORMED  [name] 03/02/2020, 2:15 PM

## 2020-03-02 NOTE — Evaluation (Signed)
Occupational Therapy Assessment and Plan  Patient Details  Name: Stephanye Finnicum MRN: [id number] Date of Birth: 1955/12/16  OT Diagnosis: acute pain and muscle weakness (generalized) Rehab Potential: Rehab Potential (ACUTE ONLY): Good ELOS: 10-14 days   Today's Date: 03/02/2020 OT Individual Time: 0900-1015 OT Individual Time Calculation (min): 75 min     Hospital Problem: Active Problems:   Left medial tibial plateau fracture   Past Medical History: No past medical history on file. Past Surgical History:  Past Surgical History:  Procedure Laterality Date  . ORIF TIBIA PLATEAU Left 02/26/2020   Procedure: OPEN REDUCTION INTERNAL FIXATION (ORIF) TIBIAL PLATEAU;  Surgeon: Shona Needles, MD;  Location: [facility];  Service: Orthopedics;  Laterality: Left;    Assessment & Plan Clinical Impression: Keshawna Dix is a 64 year old right-handed female with history of morbid obesity BMI 35.93, bipolar disorder, hypertension as well as tobacco/alcohol use, left femur fracture 2 years ago that required surgical intervention.  History taken from chart review and patient.  Patient lives with spouse.  1 level home ramped entrance.  Independent with assistive device.  Patient able to complete ADLs modified independent.  She presented on 02/25/2020 after fall, when her knee gave out from under her.  Denies LOC.  Admission chemistries hemoglobin 13.1, sodium 124, glucose 123.  X-rays and imaging revealed left medial tibial plateau fracture.  Underwent ORIF of left tibial plateau fracture on 02/26/2020 per Dr. Doreatha Martin..  Placed on Lovenox for DVT prophylaxis.  Nonweightbearing left lower extremity with left hinged knee brace locked in 5-10 degree flexion.  Hospital course further complicated by acute blood loss anemia, 10.9 and monitored.  Therapy evaluations completed with recommendations of inpatient rehab services.Jakyah Bradby is a 64 year old right-handed female with history of morbid obesity BMI 35.93, bipolar  disorder, hypertension as well as tobacco/alcohol use, left femur fracture 2 years ago that required surgical intervention.  History taken from chart review and patient.  Patient lives with spouse.  1 level home ramped entrance.  Independent with assistive device.  Patient able to complete ADLs modified independent.  She presented on 02/25/2020 after fall, when her knee gave out from under her.  Denies LOC.  Admission chemistries hemoglobin 13.1, sodium 124, glucose 123.  X-rays and imaging revealed left medial tibial plateau fracture.  Underwent ORIF of left tibial plateau fracture on 02/26/2020 per Dr. Doreatha Martin..  Placed on Lovenox for DVT prophylaxis.  Nonweightbearing left lower extremity with left hinged knee brace locked in 5-10 degree flexion.  Hospital course further complicated by acute blood loss anemia, 10.9 and monitored.  Therapy evaluations completed with recommendations of inpatient rehab services. Patient transferred to CIR on 03/01/2020 .    Patient currently requires max with basic self-care skills secondary to muscle weakness, decreased cardiorespiratoy endurance and decreased standing balance, decreased postural control and decreased balance strategies.  Prior to hospitalization, patient could complete basic ADLs with modified independent .  Patient will benefit from skilled intervention to increase independence with basic self-care skills prior to discharge home with care partner.  Anticipate patient will require intermittent supervision and no further OT follow recommended.  OT - End of Session Activity Tolerance: Decreased this session Endurance Deficit: Yes OT Assessment Rehab Potential (ACUTE ONLY): Good OT Patient demonstrates impairments in the following area(s): Balance;Safety;Sensory;Endurance;Edema;Motor;Pain OT Basic ADL's Functional Problem(s): Grooming;Bathing;Dressing;Toileting OT Transfers Functional Problem(s): Toilet OT Additional Impairment(s): None OT Plan OT  Intensity: Minimum of 1-2 x/day, 45 to 90 minutes OT Frequency: 5 out of 7 days OT Duration/Estimated Length  of Stay: 10-14 days OT Treatment/Interventions: Balance/vestibular training;Disease mangement/prevention;Self Care/advanced ADL retraining;Therapeutic Exercise;Wheelchair propulsion/positioning;DME/adaptive equipment instruction;Pain management;Skin care/wound managment;UE/LE Strength taining/ROM;Community reintegration;Patient/family education;Functional electrical stimulation;UE/LE Coordination activities;Discharge planning;Functional mobility training;Psychosocial support;Therapeutic Activities OT Self Feeding Anticipated Outcome(s): n/a OT Basic Self-Care Anticipated Outcome(s): Mod I OT Toileting Anticipated Outcome(s): Supervision OT Bathroom Transfers Anticipated Outcome(s): Supervision OT Recommendation Patient destination: Home Follow Up Recommendations: None Equipment Recommended: 3 in 1 bedside comode   OT Evaluation Precautions/Restrictions  Precautions Precautions: Knee Restrictions Weight Bearing Restrictions: Yes LLE Weight Bearing: Non weight bearing General   Vital Signs  Pain Pain Assessment Pain Scale: 0-10 Pain Score: 9  Pain Type: Surgical pain Pain Location: Knee Pain Orientation: Left Pain Descriptors / Indicators: Aching;Sharp;Shooting Pain Frequency: Constant Pain Onset: On-going Patients Stated Pain Goal: 4 Pain Intervention(s): Medication (See eMAR) Home Living/Prior [address] expects to be discharged to:: Private residence Living Arrangements: Spouse/significant other Available Help at Discharge: Family, Available 24 hours/day Type of Home: House Home Access: Ramped entrance Home Layout: One level Bathroom Shower/Tub: Chiropodist: Standard Bathroom Accessibility: Yes  Lives With: Spouse Vision Baseline Vision/History: Wears glasses Wears Glasses: At all times Patient Visual Report:  No change from baseline Vision Assessment?: No apparent visual deficits Perception  Perception: Within Functional Limits Praxis Praxis: Intact Cognition Overall Cognitive Status: Within Functional Limits for tasks assessed Arousal/Alertness: Awake/alert Orientation Level: Person;Place;Situation Person: Oriented Place: Oriented Situation: Oriented Year: 2021 Month: November Day of Week: Correct Memory: Appears intact Immediate Memory Recall: Sock;Blue;Bed Memory Recall Sock: Without Cue Memory Recall Blue: Without Cue Memory Recall Bed: Without Cue Attention: Alternating Awareness: Appears intact Problem Solving: Appears intact Safety/Judgment: Appears intact Sensation Sensation Light Touch: Appears Intact Coordination Gross Motor Movements are Fluid and Coordinated: No Fine Motor Movements are Fluid and Coordinated: Yes Coordination and Movement Description: limited by LLE pain and NWB status Motor  Motor Motor - Skilled Clinical Observations: limited by pain  Trunk/Postural Assessment  Cervical Assessment Cervical Assessment: Within Functional Limits Thoracic Assessment Thoracic Assessment: Within Functional Limits Lumbar Assessment Lumbar Assessment: Within Functional Limits Postural Control Postural Control: Within Functional Limits  Balance Balance Balance Assessed: Yes Static Sitting Balance Static Sitting - Balance Support: Feet supported;No upper extremity supported Static Sitting - Level of Assistance: 7: Independent Dynamic Sitting Balance Dynamic Sitting - Balance Support: During functional activity Dynamic Sitting - Level of Assistance: 6: Modified independent (Device/Increase time) Extremity/Trunk Assessment RUE Assessment RUE Assessment: Within Functional Limits General Strength Comments: 5/5 LUE Assessment LUE Assessment: Within Functional Limits General Strength Comments: 5/5  Care Tool Care Tool Self Care Eating        Oral Care     Oral Care Assist Level: Set up assist    Bathing   Body parts bathed by patient: Right arm;Left arm;Chest;Abdomen;Front perineal area;Left upper leg;Right upper leg;Face Body parts bathed by helper: Buttocks;Right lower leg;Left lower leg   Assist Level: Minimal Assistance - Patient > 75%    Upper Body Dressing(including orthotics)   What is the patient wearing?: Dress   Assist Level: Set up assist    Lower Body Dressing (excluding footwear) Lower body dressing activity did not occur:  (Pt had just completed hygiene with NT prior to OT arrival.)        Putting on/Taking off footwear   What is the patient wearing?: [place] for footwear: Total Assistance - Patient < 25%       Care Tool Toileting Toileting activity  Care Tool Bed Mobility Roll left and right activity        Sit to lying activity   Sit to lying assist level: Moderate Assistance - Patient 50 - 74%    Lying to sitting edge of bed activity   Lying to sitting edge of bed assist level: Minimal Assistance - Patient > 75%     Care Tool Transfers Sit to stand transfer        Chair/bed transfer         Toilet transfer         Care Tool Cognition Expression of Ideas and Wants Expression of Ideas and Wants: Without difficulty (complex and basic) - expresses complex messages without difficulty and with speech that is clear and easy to understand   Understanding Verbal and Non-Verbal Content Understanding Verbal and Non-Verbal Content: Understands (complex and basic) - clear comprehension without cues or repetitions   Memory/Recall Ability *first 3 days only Memory/Recall Ability *first 3 days only: Current season;Location of own room;Staff names and faces;That he or she is in a hospital/hospital unit    Refer to Care Plan for [place] [facility] OT Short Term Goal 1 (Week 1): With use of AE, patient will complete LB dressing with min A OT Short Term Goal 2 (Week 1):  Patient will complete toilet transfer with min A OT Short Term Goal 3 (Week 1): Patient will complete toileting task with min A  Recommendations for other services: None    Skilled Therapeutic Intervention OT evaluation completed. Discussed role of OT, safety plan, fall risk, possible DME, etc. Pt very self-motivated. Completed supine>sit with min A to manage LLE then pt remained sitting EOB for ~45 minutes. Pt completed bathing and dressing with mod-max assist. Due to NT completing peri care prior to OT arrival, pt did not complete this task. Pt reported 6-9 pain throughout session in LLE with OT assisting for positioning. Towards end of session, max pain reported with pt requesting to return to supine. Completed sit>supine with mod A and positioning in bed with min A. OT provided ice and educated on coping responses to pain. Pt left with all needs in reach and bed alarm on. RN aware of pain.  ADL   Mobility  Bed Mobility Bed Mobility: Right Sidelying to Sit;Sit to Supine Right Sidelying to Sit: Minimal Assistance - Patient > 75% Sit to Supine: Moderate Assistance - Patient 50-74%   Discharge Criteria: Patient will be discharged from OT if patient refuses treatment 3 consecutive times without medical reason, if treatment goals not met, if there is a change in medical status, if patient makes no progress towards goals or if patient is discharged from hospital.  The above assessment, treatment plan, treatment alternatives and goals were discussed and mutually agreed upon: by patient  Duayne Cal 03/02/2020, 12:41 PM

## 2020-03-02 NOTE — Progress Notes (Signed)
Jamse Arn, MD  Physician  Physical Medicine and Rehabilitation  PMR Pre-admission      Signed  Date of Service:  02/29/2020  2:35 PM      Related encounter: ED to Hosp-Admission (Discharged) from 02/24/2020 in [place]       Show:Clear all [x] Manual[x] Template[x] Copied  Added by: [x] Raechel Ache, OT[x] Jamse Arn, MD  [] Hover for details PMR Admission Coordinator Pre-Admission Assessment   [name] is an 64 y.o., female MRN: [id number] DOB: 07-11-1955 Height: 5' 8"  (172.7 cm) Weight: 107.2 kg   Insurance Information HMO:     PPO: yes     PCP:      IPA:      80/20:      OTHER:  PRIMARY: BCBS       Policy#: [id number]      Subscriber: patient CM Name: Amy via fax      Phone#: [phone]     Fax#: [phone] Pre-Cert#: [id number]      Employer:  [employer]m Kaufmann provided by Amy via fax for admit to CIR. Effective dates of authorization are: 02/29/20 with clinical update required by 03/18/20 to the above fax number [phone]) Benefits:  Phone #: [phone]     Name:  Eff. Date: 01/15/2020-04/19/2020     Deduct: $2,000 ($0 met)      Out of Pocket Max: $4,000 ($121.47 met)      Life Max: NA CIR: $0/admission co-pay , then, 90% coverage, 10% co-insurance      SNF: 90% coverage, 10% co-insurance, no co-pay; limit 60 days Outpatient: $70 per visit, 0% co-insurance, limit 30 visits    Home Health: 90% coverage, 10% co-insurance, no co-pay       DME: 90% coverage, 10% co-insurance, no co-pay     Providers:  SECONDARY: None      Policy#:      Phone#:    Development worker, community:       Phone#:    The Therapist, art Information Summary" for patients in Inpatient Rehabilitation Facilities with attached "Privacy Act [place] Records" was provided and verbally reviewed with: N/A   Emergency Contact Information         Contact Information     Name Relation Home Work Ken[place]  Spouse [phone]   [phone]         Current Medical History  Patient Admitting Diagnosis: fall resulting in left tibial plateau fracture, LCL injury, and fibular fracture s/p ORIF   History of Present Illness: [name] is a 64 year old right-handed female with history of morbid obesity BMI 35.93, bipolar disorder, hypertension as well as tobacco/alcohol use, left femur fracture 2 years ago that required surgical intervention.  Per chart review patient lives with spouse.  1 level home ramped entrance.  Independent with assistive device.  Patient able to complete ADLs modified independent.  Presented 02/25/2020 after a fall when her knee gave out from under her.  No loss of consciousness.  Admission chemistries hemoglobin 13.1, sodium 124, glucose 123.  X-rays and imaging revealed left medial tibial plateau fracture.  Underwent ORIF of left tibial plateau fracture 02/26/2020 per Dr. Doreatha Martin..  Placed on Lovenox for DVT prophylaxis.  Nonweightbearing left lower extremity with left hinged knee brace locked in 5-10 degree flexion.  Acute blood loss anemia 10.9 and monitored.  Therapy evaluations completed with recommendations of inpatient rehab services. Pt is to admit to CIR on 03/01/20.  Patient's medical record from [facility] has been reviewed by the rehabilitation admission coordinator and physician.   Past Medical History  History reviewed. No pertinent past medical history.   Family History   family history is not on file.   Prior Rehab/Hospitalizations Has the patient had prior rehab or hospitalizations prior to admission? No   Has the patient had major surgery during 100 days prior to admission? Yes              Current Medications   Current Facility-Administered Medications:  .  acetaminophen (TYLENOL) tablet 650 mg, 650 mg, Oral, Q6H, 650 mg at 03/01/20 0935 **OR** acetaminophen (TYLENOL) suppository 650 mg, 650 mg, Rectal, Q6H, Ricci Barker, Sarah A, PA-C .   amLODipine (NORVASC) tablet 5 mg, 5 mg, Oral, Daily, Gaylan Gerold, DO, 5 mg at 03/01/20 0935 .  diphenhydrAMINE (BENADRYL) capsule 25 mg, 25 mg, Oral, Q6H PRN, Cato Mulligan, MD, 25 mg at 03/01/20 0000 .  enoxaparin (LOVENOX) injection 40 mg, 40 mg, Subcutaneous, Q24H, Delray Alt, PA-C, 40 mg at 03/01/20 0934 .  gabapentin (NEURONTIN) capsule 300 mg, 300 mg, Oral, TID, Patrecia Pace A, PA-C, 300 mg at 03/01/20 0935 .  lactated ringers infusion, , Intravenous, Continuous, Delray Alt, PA-C, Last Rate: 10 mL/hr at 02/26/20 1104, New Bag at 02/26/20 1331 .  methocarbamol (ROBAXIN) tablet 500 mg, 500 mg, Oral, Q6H PRN, Delray Alt, PA-C, 500 mg at 02/29/20 0820 .  oxyCODONE (Oxy IR/ROXICODONE) immediate release tablet 5-10 mg, 5-10 mg, Oral, Q4H PRN, Patrecia Pace A, PA-C, 10 mg at 03/01/20 0254 .  senna (SENOKOT) tablet 8.6 mg, 1 tablet, Oral, BID, Patrecia Pace A, PA-C, 8.6 mg at 03/01/20 0935 .  Vitamin D (Ergocalciferol) (DRISDOL) capsule 50,000 Units, 50,000 Units, Oral, Q7 days, Vivien Rota, 50,000 Units at 02/26/20 2258   Patients Current Diet:  Diet Order                      Diet Heart Room service appropriate? Yes; Fluid consistency: Thin  Diet effective now                      Precautions / Restrictions Precautions Precautions: Fall Other Brace: L knee hinged brace locked in 5-10' flexion (ortho does not want knee extended fully) Restrictions Weight Bearing Restrictions: Yes LLE Weight Bearing: Touchdown weight bearing    Has the patient had 2 or more falls or a fall with injury in the past year? Yes   Prior Activity Level Limited Community (1-2x/wk): limited due to LLE injuries in the past; limited mobility; used electric scooter in grocery store and RW vs cane in house.    Prior Functional Level Self Care: Did the patient need help bathing, dressing, using the toilet or eating? Independent   Indoor Mobility: Did the patient need assistance with  walking from room to room (with or without device)? Independent   Stairs: Did the patient need assistance with internal or external stairs (with or without device)? Dependent   Functional Cognition: Did the patient need help planning regular tasks such as shopping or remembering to take medications? Independent   Home Assistive Devices / Equipment Home Assistive Devices/Equipment: None Home Equipment: Walker - 2 wheels, Hand held shower head, Adaptive equipment, Cane - quad   Prior Device Use: Indicate devices/aids used by the patient prior to current illness, exacerbation or injury? Motorized wheelchair or scooter, Environmental consultant and cane   Current  Functional Level Cognition   Overall Cognitive Status: Within Functional Limits for tasks assessed Orientation Level: Oriented X4 General Comments: Anxiety regarding pain with therapy, looks for feedback, implements safety techniques after cueing     Extremity Assessment (includes Sensation/Coordination)   Upper Extremity Assessment: Overall WFL for tasks assessed  Lower Extremity Assessment: Defer to PT evaluation LLE Deficits / Details: s/p L tibial ORIF locked in 10' flex hinged brace; hip functionally <3/5, likely limited by post-op pain and weakness LLE: Unable to fully assess due to pain, Unable to fully assess due to immobilization LLE Coordination: decreased gross motor, decreased fine motor     ADLs   Overall ADL's : Needs assistance/impaired Eating/Feeding: Independent, Sitting Grooming: Set up, Sitting Upper Body Bathing: Minimal assistance, Sitting Lower Body Bathing: Maximal assistance, Sitting/lateral leans Upper Body Dressing : Set up, Sitting Lower Body Dressing: Total assistance, Sitting/lateral leans, Sit to/from stand Lower Body Dressing Details (indicate cue type and reason): Total A to don socks, may benefit from AE education during next session Toilet Transfer: Maximal assistance, +2 for physical assistance, +2 for  safety/equipment, Squat-pivot, BSC (drop arm BSC) Toilet Transfer Details (indicate cue type and reason): Max A x 2 for squat pivot/scooting transfer to drop arm BSC, cueing for technique and pt with improving problem solving  Toileting- Clothing Manipulation and Hygiene: Total assistance, Sit to/from stand, Sitting/lateral lean, +2 for physical assistance, +2 for safety/equipment Toileting - Clothing Manipulation Details (indicate cue type and reason): Total A for peri care after BM with slight squat stance. x 2 assist needed. Pt reports difficulty reaching and uses a bidet at home General ADL Comments: Limited by pain but motivated to return to independence and attempt all tasks     Mobility   Overal bed mobility: Needs Assistance Bed Mobility: Supine to Sit, Sit to Supine Supine to sit: Min assist, HOB elevated Sit to supine: Mod assist General bed mobility comments: Min A using gait belt as leg lifter and HOB slightly elevated. Heavy use of bed rail.   Pt required moderate assistance to manage LLE to move back into bed.  Pt placed in trendelenberg position to boost to head of bed. Increased time to position.     Transfers   Overall transfer level: Needs assistance Equipment used: 1 person hand held assist (assisted L side and leaning to the R with forearm supported on back of recliner.) Transfers: Sit to/from Stand Sit to Stand: Max assist, +2 physical assistance, +2 safety/equipment Squat pivot transfers: Mod assist, +2 safety/equipment  Lateral/Scoot Transfers: Min guard General transfer comment: Pt able to stand twice from elevate surface of bed.  Assistance to boost into standing with use of back of recliner on R side to pull and support weight on R forarm.  Pt stood for 10-20 sec each trial.     Ambulation / Gait / Stairs / Wheelchair Mobility   Ambulation/Gait Ambulation/Gait assistance:  (NT)     Posture / Balance Balance Overall balance assessment: Needs assistance, History of  Falls Sitting-balance support: No upper extremity supported, Feet supported Sitting balance-Leahy Scale: Good Standing balance-Leahy Scale: Poor Standing balance comment: Dependent for posterior pericare with full standing. heavy reliance on RUE support on back of recliner.     Special needs/care consideration Continuous Drip IV  : lactated ringers infusion,  Skin: excoriated skin: abdomen; surgical incision to knee       Previous Home Environment (from acute therapy documentation) Living Arrangements: Spouse/significant other Available Help at Discharge: Family Type of  Home: House Home Layout: One level Home Access: Ramped entrance Bathroom Shower/Tub: Chiropodist: Standard (low toilet) Bathroom Accessibility: Yes How Accessible: Accessible via walker [place]: No Additional Comments: Ramp being set-up today   Discharge Living Setting Plans for Discharge Living Setting: House (lives with husband) Type of Home at Discharge: House Discharge Home Layout: One level Discharge Home Access: Ramped entrance (vs one step to enter in to garage (no rails)) Discharge Bathroom Shower/Tub: Tub/shower unit Discharge Bathroom Toilet: Standard Discharge Bathroom Accessibility: Yes How Accessible: Accessible via walker Does the patient have any problems obtaining your medications?: No   Social/Family/Support Systems Patient Roles: Spouse Contact Information: [name] (husband): [phone] Anticipated Caregiver: husband  Anticipated Caregiver's Contact Information: see above Ability/Limitations of Caregiver: Min A Caregiver Availability: 24/7 Discharge Plan Discussed with Primary Caregiver: Yes Is Caregiver In Agreement with Plan?: Yes Does Caregiver/Family have Issues with Lodging/Transportation while Pt is in Rehab?: No   Goals Patient/Family Goal for Rehab: PT/OT: Min A; SLP: NA Expected length of stay: 12-16 days Pt/Family Agrees to Admission and willing  to participate: Yes Program Orientation Provided & Reviewed with Pt/Caregiver Including Roles  & Responsibilities: Yes (pt and her husband)  Barriers to Discharge: Weight bearing restrictions   Decrease burden of Care through IP rehab admission: Other NA   Possible need for SNF placement upon discharge: Not anticipated; pt has good support at DC and an accessible home (ramped entrance).    Patient Condition: I have reviewed medical records from [facility], spoken with MD, RN, [facility] Team, and patient and spouse. I met with patient at the bedside for inpatient rehabilitation assessment.  Patient will benefit from ongoing PT and OT, can actively participate in 3 hours of therapy a day 5 days of the week, and can make measurable gains during the admission.  Patient will also benefit from the coordinated team approach during an Inpatient Acute Rehabilitation admission.  The patient will receive intensive therapy as well as Rehabilitation physician, nursing, social worker, and care management interventions.  Due to safety, skin/wound care, disease management, medication administration, pain management and patient education the patient requires 24 hour a day rehabilitation nursing.  The patient is currently Mod A +2 for transfers, no gait, and Max +2 to total A for basic ADLs.  Discharge setting and therapy post discharge at home with home health is anticipated.  Patient has agreed to participate in the Acute Inpatient Rehabilitation Program and will admit 03/01/20.   Preadmission Screen Completed By:  Raechel Ache, 03/01/2020 1:11 PM ______________________________________________________________________   Discussed status with Dr. Posey Pronto on 03/01/20 at 1:11PM and received approval for admission today 03/01/20.   Admission Coordinator:  Raechel Ache, OT, time 1:11PM/Date 03/01/20    Assessment/Plan: Diagnosis: left tibial plateau fracture, LCL injury, and fibular fracture s/p ORIF    1. Does the need for close, 24 hr/day Medical supervision in concert with the patient's rehab needs make it unreasonable for this patient to be served in a less intensive setting? Yes  2. Co-Morbidities requiring supervision/potential complications: morbid obesity (encourage weight loss), bipolar disorder, HTN (monitor and provide prns in accordance with increased physical exertion and pain), as well as tobacco/alcohol use,  3. Due to bladder management, bowel management, safety, skin/wound care, disease management, pain management and patient education, does the patient require 24 hr/day rehab nursing? Yes 4. Does the patient require coordinated care of a physician, rehab nurse, PT, OT to address physical and functional deficits in the context  of the above medical diagnosis(es)? Yes Addressing deficits in the following areas: balance, endurance, locomotion, strength, transferring, bathing, dressing, toileting and psychosocial support 5. Can the patient actively participate in an intensive therapy program of at least 3 hrs of therapy 5 days a week? Yes 6. The potential for patient to make measurable gains while on inpatient rehab is excellent 7. Anticipated functional outcomes upon discharge from inpatient rehab: supervision and min assist PT, supervision and min assist OT, n/a SLP 8. Estimated rehab length of stay to reach the above functional goals is: 12-15 days. 9. Anticipated discharge destination: Home 10. Overall Rehab/Functional Prognosis: good     MD Signature: Delice Lesch, MD, ABPMR        Revision History                          Note Details  Author Jamse Arn, MD File Time 03/01/2020  2:33 PM  Author Type Physician Status Signed  Last Editor Jamse Arn, MD Service Physical Medicine and [place] # [id number] Admit Date 03/01/2020

## 2020-03-03 ENCOUNTER — Inpatient Hospital Stay (HOSPITAL_COMMUNITY): Payer: BC Managed Care – PPO

## 2020-03-03 DIAGNOSIS — M7989 Other specified soft tissue disorders: Secondary | ICD-10-CM

## 2020-03-03 MED ORDER — GABAPENTIN 300 MG PO CAPS
600.0000 mg | ORAL_CAPSULE | Freq: Once | ORAL | Status: AC
  Administered 2020-03-03: 600 mg via ORAL
  Filled 2020-03-03: qty 2

## 2020-03-03 MED ORDER — ZINC OXIDE 40 % EX OINT
TOPICAL_OINTMENT | Freq: Two times a day (BID) | CUTANEOUS | Status: DC
  Administered 2020-03-03 – 2020-03-11 (×3): 1 via TOPICAL
  Filled 2020-03-03 (×2): qty 57

## 2020-03-03 MED ORDER — GABAPENTIN 300 MG PO CAPS
900.0000 mg | ORAL_CAPSULE | Freq: Two times a day (BID) | ORAL | Status: DC
  Administered 2020-03-03 – 2020-03-14 (×22): 900 mg via ORAL
  Filled 2020-03-03 (×22): qty 3

## 2020-03-03 MED ORDER — DIPHENHYDRAMINE HCL 25 MG PO CAPS
25.0000 mg | ORAL_CAPSULE | ORAL | Status: DC | PRN
  Administered 2020-03-03 – 2020-03-07 (×20): 25 mg via ORAL
  Filled 2020-03-03 (×20): qty 1

## 2020-03-03 NOTE — Progress Notes (Signed)
[facility] PHYSICAL MEDICINE & REHABILITATION PROGRESS NOTE   Subjective/Complaints:   Pt said itching because it's "nerve pain" that itches- explained was taking gabapentin 900 mg BID at home- explained likely itching because in withdrawal- let's restart gabapentin 900 mg BID and go from there.   Won't increase benadryl at this time- but will con't 25 mg q6 hours prn.   Also used desitin at home under pannus and breasts- would like to restart.   ROS:  Pt denies SOB, abd pain, CP, N/V/C/D, and vision changes   Objective:   No results found. No results for input(s): WBC, HGB, HCT, PLT in the last 72 hours. Recent Labs    03/01/20 0235  NA 136  K 4.5  CL 98  CO2 30  GLUCOSE 108*  BUN 8  CREATININE 0.54  CALCIUM 9.0    Intake/Output Summary (Last 24 hours) at 03/03/2020 1348 Last data filed at 03/03/2020 1110 Gross per 24 hour  Intake 1200 ml  Output 1950 ml  Net -750 ml        Physical Exam: Vital Signs Blood pressure (!) 129/53, pulse 66, temperature (!) 97.5 F (36.4 C), temperature source Oral, resp. rate 18, height 5\' 8"  (1.727 m), weight (!) 143.2 kg, SpO2 99 %.   Physical Exam Vitals reviewed.  Constitutional: pt getting changed by NT's, being turned; NAD- c/o itching- constant HENT: conjugate gaze Cardiovascular: RRR  Pulmonary: CTA B/L- no W/R/R- good air movement Abdominal: Soft, NT, ND, (+)BS  Musculoskeletal:     Cervical back: Normal range of motion and neck supple.     Comments: Left knee with edema and tenderness - a lot of associated bruising seen- wearing Bledsoe on LLE Skin:    Comments: Left knee incision site CDI - looks good- dressing off.  Neurological: Ox3    Comments: Alert Motor: Bilateral upper extremities: 5/5 proximal distal Right lower extremity: 4+/5 proximal distal Left lower extremity: Hip flexion to/5 (some pain inhibition), knee brace, ankle dorsiflexion 4+/5  Psychiatric:    appropriate   Assessment/Plan: 1.  Functional deficits which require 3+ hours per day of interdisciplinary therapy in a comprehensive inpatient rehab setting.  Physiatrist is providing close team supervision and 24 hour management of active medical problems listed below.  Physiatrist and rehab team continue to assess barriers to discharge/monitor patient progress toward functional and medical goals  Care Tool:  Bathing    Body parts bathed by patient: Right arm, Left arm, Chest, Face   Body parts bathed by helper: Front perineal area, Buttocks     Bathing assist Assist Level: Minimal Assistance - Patient > 75%     Upper Body Dressing/Undressing Upper body dressing   What is the patient wearing?: Hospital gown only    Upper body assist Assist Level: Minimal Assistance - Patient > 75%    Lower Body Dressing/Undressing Lower body dressing    Lower body dressing activity did not occur:  (Pt had just completed hygiene with NT prior to OT arrival.)       Lower body assist Assist for lower body dressing: 2 Helpers     Toileting Toileting    Toileting assist Assist for toileting: Maximal Assistance - Patient 25 - 49%     Transfers Chair/bed transfer  Transfers assist     Chair/bed transfer assist level: 2 Helpers     Locomotion Ambulation   Ambulation assist   Ambulation activity did not occur: Safety/medical concerns  Walk 10 feet activity   Assist  Walk 10 feet activity did not occur: Safety/medical concerns        Walk 50 feet activity   Assist Walk 50 feet with 2 turns activity did not occur: Safety/medical concerns         Walk 150 feet activity   Assist Walk 150 feet activity did not occur: Safety/medical concerns         Walk 10 feet on uneven surface  activity   Assist Walk 10 feet on uneven surfaces activity did not occur: Safety/medical concerns         Wheelchair     Assist Will patient use wheelchair at discharge?: Yes Type of  Wheelchair: Manual Wheelchair activity did not occur: Safety/medical concerns         Wheelchair 50 feet with 2 turns activity    Assist    Wheelchair 50 feet with 2 turns activity did not occur: Safety/medical concerns       Wheelchair 150 feet activity     Assist  Wheelchair 150 feet activity did not occur: Safety/medical concerns       Blood pressure (!) 129/53, pulse 66, temperature (!) 97.5 F (36.4 C), temperature source Oral, resp. rate 18, height 5\' 8"  (1.727 m), weight (!) 143.2 kg, SpO2 99 %.  Medical Problem List and Plan: 1.  Decreased functional mobility secondary to left tibial plateau fracture.  Status post ORIF 02/25/2020.  Nonweightbearing left lower extremity with hinged knee brace locked in 5 to 10 degree flexion.  Ortho does not want knee extended fully  11/13- weight went from 107 kg to 143 kg in 4 days- will check bed scale  11/14- BMI now 48- I think it's the change in bed?             -patient may not shower             -ELOS/Goals: 12-15 days/supervision/min a             Admit to CIR 2.  Antithrombotics: -DVT/anticoagulation: Lovenox.  Vascular studies ordered.             -antiplatelet therapy: N/A 3. Pain Management: Neurontin 300 mg 3 times daily, oxycodone and Robaxin as needed  11/13- pain usually controlled with current regimen- con't regimen  11/14- itching is likely due to neuropathic pain- will increase gabapentin to home dose (taking from her dog!)- 900 mg BID for now-              Monitor with increased exertion 4. Mood: Provide emotional support             -antipsychotic agents: N/A 5. Neuropsych: This patient is capable of making decisions on her own behalf. 6. Skin/Wound Care: Routine skin checks 7. Fluids/Electrolytes/Nutrition: Routine in and outs.  CMP ordered. 8.  Acute blood loss anemia.               CBC ordered. 9.  Hypertension.  Norvasc 5 mg daily.   11/13- BP controlled con't regimen              Monitor with  increased mobility. 10.  History of alcohol tobacco use.  Provide counseling 11.  Morbid obesity.  BMI 35.93.  Encourage weight loss.  Dietary follow-up  11/13- we weighed pt- her BMI was 36- now 48- something wrong with weight- will have nursing weigh again.  11/14- weight up to a BMI of 48- again- likely the type of bed?  12. Itching  11/13- started benadryl 25 mg q6 hours prn- con't  11/14- won't change benadryl dose- increase gabapentin   LOS: 2 days A FACE TO FACE EVALUATION WAS PERFORMED  Elimelech Houseman 03/03/2020, 1:48 PM

## 2020-03-03 NOTE — Progress Notes (Signed)
Bilateral lower extremity venous duplex has been completed. Preliminary results can be found in CV Proc through chart review.   03/03/20 3:04 PM [name] RVT

## 2020-03-04 ENCOUNTER — Inpatient Hospital Stay (HOSPITAL_COMMUNITY): Payer: BC Managed Care – PPO

## 2020-03-04 ENCOUNTER — Inpatient Hospital Stay (HOSPITAL_COMMUNITY): Payer: BC Managed Care – PPO | Admitting: Occupational Therapy

## 2020-03-04 ENCOUNTER — Encounter (HOSPITAL_COMMUNITY): Payer: Self-pay | Admitting: Physical Medicine & Rehabilitation

## 2020-03-04 ENCOUNTER — Other Ambulatory Visit: Payer: Self-pay

## 2020-03-04 DIAGNOSIS — E871 Hypo-osmolality and hyponatremia: Secondary | ICD-10-CM

## 2020-03-04 DIAGNOSIS — S82132D Displaced fracture of medial condyle of left tibia, subsequent encounter for closed fracture with routine healing: Secondary | ICD-10-CM

## 2020-03-04 DIAGNOSIS — L299 Pruritus, unspecified: Secondary | ICD-10-CM

## 2020-03-04 DIAGNOSIS — G8918 Other acute postprocedural pain: Secondary | ICD-10-CM

## 2020-03-04 DIAGNOSIS — I10 Essential (primary) hypertension: Secondary | ICD-10-CM

## 2020-03-04 DIAGNOSIS — D62 Acute posthemorrhagic anemia: Secondary | ICD-10-CM

## 2020-03-04 LAB — CBC WITH DIFFERENTIAL/PLATELET
Abs Immature Granulocytes: 0.04 10*3/uL (ref 0.00–0.07)
Basophils Absolute: 0.1 10*3/uL (ref 0.0–0.1)
Basophils Relative: 1 %
Eosinophils Absolute: 0.2 10*3/uL (ref 0.0–0.5)
Eosinophils Relative: 3 %
HCT: 35.9 % — ABNORMAL LOW (ref 36.0–46.0)
Hemoglobin: 11.7 g/dL — ABNORMAL LOW (ref 12.0–15.0)
Immature Granulocytes: 1 %
Lymphocytes Relative: 17 %
Lymphs Abs: 1.2 10*3/uL (ref 0.7–4.0)
MCH: 31 pg (ref 26.0–34.0)
MCHC: 32.6 g/dL (ref 30.0–36.0)
MCV: 95.2 fL (ref 80.0–100.0)
Monocytes Absolute: 0.5 10*3/uL (ref 0.1–1.0)
Monocytes Relative: 8 %
Neutro Abs: 5.2 10*3/uL (ref 1.7–7.7)
Neutrophils Relative %: 70 %
Platelets: 304 10*3/uL (ref 150–400)
RBC: 3.77 MIL/uL — ABNORMAL LOW (ref 3.87–5.11)
RDW: 13.2 % (ref 11.5–15.5)
WBC: 7.2 10*3/uL (ref 4.0–10.5)
nRBC: 0 % (ref 0.0–0.2)

## 2020-03-04 LAB — COMPREHENSIVE METABOLIC PANEL
ALT: 20 U/L (ref 0–44)
AST: 15 U/L (ref 15–41)
Albumin: 3 g/dL — ABNORMAL LOW (ref 3.5–5.0)
Alkaline Phosphatase: 68 U/L (ref 38–126)
Anion gap: 8 (ref 5–15)
BUN: 8 mg/dL (ref 8–23)
CO2: 27 mmol/L (ref 22–32)
Calcium: 8.7 mg/dL — ABNORMAL LOW (ref 8.9–10.3)
Chloride: 99 mmol/L (ref 98–111)
Creatinine, Ser: 0.54 mg/dL (ref 0.44–1.00)
GFR, Estimated: >60 mL/min (ref >60)
Glucose, Bld: 121 mg/dL — ABNORMAL HIGH (ref 70–99)
Potassium: 3.9 mmol/L (ref 3.5–5.1)
Sodium: 134 mmol/L — ABNORMAL LOW (ref 135–145)
Total Bilirubin: 0.5 mg/dL (ref 0.3–1.2)
Total Protein: 5.6 g/dL — ABNORMAL LOW (ref 6.5–8.1)

## 2020-03-04 NOTE — Progress Notes (Signed)
Inpatient Rehabilitation  Patient information reviewed and entered into eRehab system by Tanara Turvey M. Osiris Charles, M.A., CCC/SLP, PPS Coordinator.  Information including medical coding, functional ability and quality indicators will be reviewed and updated through discharge.    

## 2020-03-04 NOTE — Progress Notes (Signed)
Inpatient [facility] Individual Statement of Services  Patient Name:  [name]  Date:  03/04/2020  Welcome to the [facility].  Our goal is to provide you with an individualized program based on your diagnosis and situation, designed to meet your specific needs.  With this comprehensive rehabilitation program, you will be expected to participate in at least 3 hours of rehabilitation therapies Monday-Friday, with modified therapy programming on the weekends.  Your rehabilitation program will include the following services:  Physical Therapy (PT), Occupational Therapy (OT), 24 hour per day rehabilitation nursing, Therapeutic Recreaction (TR), Neuropsychology, Care Coordinator, Rehabilitation Medicine, Nutrition Services and Pharmacy Services  Weekly team conferences will be held on Wednesday to discuss your progress.  Your Inpatient Rehabilitation Care Coordinator will talk with you frequently to get your input and to update you on team discussions.  Team conferences with you and your family in attendance may also be held.  Expected length of stay: 12-14 days  Overall anticipated outcome: supervision-min level-ambulation  Depending on your progress and recovery, your program may change. Your Inpatient Rehabilitation Care Coordinator will coordinate services and will keep you informed of any changes. Your Inpatient Rehabilitation Care Coordinator's name and contact numbers are listed  below.  The following services may also be recommended but are not provided by the [facility] [place]:    [place] will be made to provide these services after discharge if needed.  Arrangements include referral to agencies that provide these services.  Your insurance has been verified to be:  [place] Your primary doctor is:  Maudie Mercury [place]  Pertinent information will be shared with your  doctor and your insurance company.  Inpatient Rehabilitation Care Coordinator:  Ovidio Kin, [place] or Emilia Beck  Information discussed with and copy given to patient by: Elease Hashimoto, 03/04/2020, 9:04 AM

## 2020-03-04 NOTE — Progress Notes (Signed)
Physical Therapy Session Note  Patient Details  Name: [name] MRN: [id number] Date of Birth: [birth year]  Today's Date: 03/04/2020 PT Individual Time: 4132-4401 and 0272-5366  PT Individual Time Calculation (min): 73 min and 50 min PT Missed Time: 25 minutes due to pain and fatigue  Short Term Goals: Week 1:  PT Short Term Goal 1 (Week 1): supine to/from sit w/supervision PT Short Term Goal 2 (Week 1): sliding board transfers w/level surfaces w/min assist of 1 PT Short Term Goal 3 (Week 1): sit to stand in parallel bars or w/bari walker w/mod assist NWBing LLE PT Short Term Goal 4 (Week 1): pt will maintain NWBing in standing w/LRAD x >1 min  Skilled Therapeutic Interventions/Progress Updates:   Treatment Session 1: 4403-4742 59 min Received pt supine in bed, pt agreeable to therapy, and reported pain 8/10 in L knee (premedicated). Repositioning, rest breaks, and distraction done to reduce pain levels. Session with emphasis on functional mobility/transfers, dressing, generalized strengthening, dynamic standing balance/coordinaiton, and improved activity tolerance. Therapist adjusted L bledsoe brace total A. CSW present during session. Discussed equipment and pt reported her husband will be present to assist at home but has a pacemaker. Pt voided in female urinal with total A to position urinal. Donned R non-skid socks total A and shorts in supine with max A to thread LLE through. Pt rolled L and R with supervision and use of bedrails and required max A to pull pants over hips. Pt transferred supine<>sitting EOB with supervision using gait belt as leg lifter on LLE. Donned pull over shirt with supervision while sitting EOB. Worked on lateral scooting up/down bed with CGA while maintaining LLE NWB precautions. Pt transferred sit<>stand with RW with mod A of 2 however pt unable to come unto complete standing and unable to let go of bedrail due to fear and pain. Returned to sitting for safety and  transferred bed<>WC via slideboard with mod A +2 and total A to position board. Therapist located LLE elevating legrest and pt transported to therapy gym in [facility] total A for time management purposes. Pt transferred sit<>stand inside // bars x 3 trials with mod A +2 for safety. Pt able to remain standing ~1 minute prior to requesting to sit and demonstrating occasional TDWB on LLE otherwise adhering to NWB precautions well. Initially upon standing RLE trembling and pt fearful, however improved with increased time. Pt with increased anxiety throughout session and required multiple extended rest breaks throughout session due to pain and poor activity tolerance, however pt extremely motivated to work hard in therapy. Pt transported back to room in [facility] total A. Concluded session with pt sitting in [facility] with needs within reach. Therapist provided pt with ice pack to L knee and NT present change linens.   Treatment Session 2: 1415-1505 50 min Received pt supine in bed with NT assisting pt to get pants on, PT took over with care. Pt agreeable to therapy, and reported pain 7/10 in LLE (premedicated). Repositioning, rest breaks, and distraction done to reduce pain levels. Pt with frequent screaming outbursts with minimal movement due to high pain levels and requires increased time with all mobility. Pt rolled L and R with supervision and use of bedrails and required mod A +2 for time management purposes to fasten brief and pull pants over hips. Pt transferred supine<>sitting EOB with supervision using gait belt as leg lifter for LLE and requested to attempt standing with new bariatric RW. Upon repositioning LLE mod I while sitting EOB  pt with sudden intense and excruciating pain resulting in pt screaming out in pain. Pt with increased anxiety, emotional, and crying due to high pain levels. Pt unable to get comfortable sitting EOB and insisted on returning to supine to rest. Sit<>supine with mod A +2 and total A to manage LLE.  Pt required increased time to calm down, then requested to get undressed. Doffed pants with mod A +2 and shirt with supervision and cues. Donned gown with min A and pt required significantly increased time to reposition in bed for comfort. Concluded session with pt supine in bed, needs within reach, and bed alarm on. Therapist provided fresh drinks for pt and NT present assessing vitals. 25 minutes missed of skilled physical therapy due to pain and fatigue.   Therapy Documentation Precautions:  Precautions Precautions: Knee Precaution Comments: Bledsoe locked 15 degree flexion Other Brace: L knee hinged brace locked in 5-10' flexion (ortho does not want knee extended fully) Restrictions Weight Bearing Restrictions: Yes LLE Weight Bearing: Non weight bearing Other Position/Activity Restrictions: LLE  Therapy/Group: Individual Therapy Alfonse Alpers PT, DPT   03/04/2020, 7:21 AM

## 2020-03-04 NOTE — Progress Notes (Signed)
[place] PHYSICAL MEDICINE & REHABILITATION PROGRESS NOTE   Subjective/Complaints: Patient seen laying in bed this A.  She states she slept well overnight.  She states she has not had a good BM, but would like to wait 1 more day before making med adjustments.  She has questions regarding dopplers, reviewed with patient.  She notes improvement in pruritis.   ROS: Denies CP, SOB, N/V/D  Objective:   VAS [place] LOWER EXTREMITY VENOUS (DVT)  Result Date: 03/04/2020  Lower Venous DVT Study Indications: Swelling.  Risk Factors: Surgery Trauma. Limitations: Body habitus, poor ultrasound/tissue interface, bandages, orthopaedic appliance and patient positioning, patient immobility, patient pain tolerance. Comparison Study: No prior studies. Performing Technologist: Oliver Hum RVT  Examination Guidelines: A complete evaluation includes B-mode imaging, spectral Doppler, color Doppler, and power Doppler as needed of all accessible portions of each vessel. Bilateral testing is considered an integral part of a complete examination. Limited examinations for reoccurring indications may be performed as noted. The reflux portion of the exam is performed with the patient in reverse Trendelenburg.  +---------+---------------+---------+-----------+----------+--------------+ RIGHT    CompressibilityPhasicitySpontaneityPropertiesThrombus Aging +---------+---------------+---------+-----------+----------+--------------+ CFV      Full           Yes      Yes                                 +---------+---------------+---------+-----------+----------+--------------+ SFJ      Full                                                        +---------+---------------+---------+-----------+----------+--------------+ FV Prox  Full                                                        +---------+---------------+---------+-----------+----------+--------------+ FV Mid   Full                                                         +---------+---------------+---------+-----------+----------+--------------+ FV DistalFull                                                        +---------+---------------+---------+-----------+----------+--------------+ PFV      Full                                                        +---------+---------------+---------+-----------+----------+--------------+ POP      Full           Yes      Yes                                 +---------+---------------+---------+-----------+----------+--------------+  PTV      Full                                                        +---------+---------------+---------+-----------+----------+--------------+ PERO     Full                                                        +---------+---------------+---------+-----------+----------+--------------+   +---------+---------------+---------+-----------+----------+-------------------+ LEFT     CompressibilityPhasicitySpontaneityPropertiesThrombus Aging      +---------+---------------+---------+-----------+----------+-------------------+ CFV      Full           Yes      Yes                                      +---------+---------------+---------+-----------+----------+-------------------+ SFJ      Full                                                             +---------+---------------+---------+-----------+----------+-------------------+ FV Prox  Full                                                             +---------+---------------+---------+-----------+----------+-------------------+ FV Mid   Full                                                             +---------+---------------+---------+-----------+----------+-------------------+ FV Distal               Yes      Yes                                      +---------+---------------+---------+-----------+----------+-------------------+ PFV      Full                                                              +---------+---------------+---------+-----------+----------+-------------------+ POP                                                   Not well visualized +---------+---------------+---------+-----------+----------+-------------------+ PTV      Full                                                             +---------+---------------+---------+-----------+----------+-------------------+  PERO                                                  Not well visualized +---------+---------------+---------+-----------+----------+-------------------+     Summary: RIGHT: - There is no evidence of deep vein thrombosis in the lower extremity. However, portions of this examination were limited- see technologist comments above.  - No cystic structure found in the popliteal fossa.  LEFT: - There is no evidence of deep vein thrombosis in the lower extremity. However, portions of this examination were limited- see technologist comments above.  - No cystic structure found in the popliteal fossa.  *See table(s) above for measurements and observations. Electronically signed by Servando Snare MD on 03/04/2020 at 4:33:35 PM.    Final    Recent Labs    03/04/20 0721  WBC 7.2  HGB 11.7*  HCT 35.9*  PLT 304   Recent Labs    03/04/20 0721  NA 134*  K 3.9  CL 99  CO2 27  GLUCOSE 121*  BUN 8  CREATININE 0.54  CALCIUM 8.7*    Intake/Output Summary (Last 24 hours) at 03/04/2020 1839 Last data filed at 03/04/2020 1243 Gross per 24 hour  Intake 440 ml  Output 2550 ml  Net -2110 ml        Physical Exam: Vital Signs Blood pressure 93/79, pulse 78, temperature 98.1 F (36.7 C), resp. rate 18, height 5\' 8"  (1.727 m), weight (!) 143.2 kg, SpO2 100 %. Constitutional: No distress . Vital signs reviewed. HENT: Normocephalic.  Atraumatic. Eyes: EOMI. No discharge. Cardiovascular: No JVD.  RRR. Respiratory: Normal effort.  No stridor.  Bilateral clear to  auscultation. GI: Non-distended.  BS +. Skin: Warm and dry.  Left knee with incision CDI. Psych: Normal mood.  Normal behavior. Musc: Left knee with edema and tenderness Neuro:  Alert Motor: Bilateral upper extremities: 5/5 proximal distal Right lower extremity: 4+/5 proximal distal Left lower extremity: Hip flexion 2+/5 (some pain inhibition), knee brace, ankle dorsiflexion 4+/5   Assessment/Plan: 1. Functional deficits which require 3+ hours per day of interdisciplinary therapy in a comprehensive inpatient rehab setting.  Physiatrist is providing close team supervision and 24 hour management of active medical problems listed below.  Physiatrist and rehab team continue to assess barriers to discharge/monitor patient progress toward functional and medical goals  Care Tool:  Bathing    Body parts bathed by patient: Right arm, Left arm, Chest, Face   Body parts bathed by helper: Front perineal area, Buttocks     Bathing assist Assist Level: Minimal Assistance - Patient > 75%     Upper Body Dressing/Undressing Upper body dressing   What is the patient wearing?: Hospital gown only    Upper body assist Assist Level: Minimal Assistance - Patient > 75%    Lower Body Dressing/Undressing Lower body dressing    Lower body dressing activity did not occur:  (Pt had just completed hygiene with NT prior to OT arrival.)       Lower body assist Assist for lower body dressing: 2 Helpers     Toileting Toileting    Toileting assist Assist for toileting: Maximal Assistance - Patient 25 - 49%     Transfers Chair/bed transfer  Transfers assist     Chair/bed transfer assist level: 2 Helpers     Locomotion Ambulation   Ambulation assist  Ambulation activity did not occur: Safety/medical concerns          Walk 10 feet activity   Assist  Walk 10 feet activity did not occur: Safety/medical concerns        Walk 50 feet activity   Assist Walk 50 feet with 2  turns activity did not occur: Safety/medical concerns         Walk 150 feet activity   Assist Walk 150 feet activity did not occur: Safety/medical concerns         Walk 10 feet on uneven surface  activity   Assist Walk 10 feet on uneven surfaces activity did not occur: Safety/medical concerns         Wheelchair     Assist Will patient use wheelchair at discharge?: Yes Type of Wheelchair: Manual Wheelchair activity did not occur: Safety/medical concerns         Wheelchair 50 feet with 2 turns activity    Assist    Wheelchair 50 feet with 2 turns activity did not occur: Safety/medical concerns       Wheelchair 150 feet activity     Assist  Wheelchair 150 feet activity did not occur: Safety/medical concerns       Blood pressure 93/79, pulse 78, temperature 98.1 F (36.7 C), resp. rate 18, height 5\' 8"  (1.727 m), weight (!) 143.2 kg, SpO2 100 %.  Medical Problem List and Plan: 1.  Decreased functional mobility secondary to left tibial plateau fracture.  Status post ORIF 02/25/2020.  Nonweightbearing left lower extremity with hinged knee brace locked in 5 to 10 degree flexion.  Ortho does not want knee extended fully  Continue CIR 2.  Antithrombotics: -DVT/anticoagulation: Lovenox.    Vascular studies reviewed, negative for DVT, await final read             -antiplatelet therapy: N/A 3. Pain Management:    Oxycodone and Robaxin as needed  Increased gabapentin to home dose 900 mg BID   Controlled with meds on 11/15             Monitor with increased exertion 4. Mood: Provide emotional support             -antipsychotic agents: N/A 5. Neuropsych: This patient is capable of making decisions on her own behalf. 6. Skin/Wound Care: Routine skin checks 7. Fluids/Electrolytes/Nutrition: Routine in and outs.   8.  Acute blood loss anemia.               Hb 11.7 on 11/15  Cont to monitor  9.  Hypertension.  Norvasc 5 mg daily.   Relatively controlled  on 11/15             Monitor with increased mobility. 10.  History of alcohol tobacco use.  Provide counseling 11.  Morbid obesity.  BMI 48.00 Encourage weight loss.  Dietary follow-up 12. Pruritis  Benadryl 25 mg q6 hours prn  Improving 13. Hyponatremia  Na+ 134 on 11/15  Cont to monitor 14. Hypoalbuminemia  Supplement initiated on 11/15  LOS: 3 days A FACE TO FACE EVALUATION WAS PERFORMED  Lynisha Osuch Lorie Phenix 03/04/2020, 6:39 PM

## 2020-03-04 NOTE — Progress Notes (Signed)
Occupational Therapy Session Note  Patient Details  Name: [name] MRN: [id number] Date of Birth: [birth year]  Today's Date: 03/04/2020 OT Individual Time: 6606-0045 OT Individual Time Calculation (min): 58 min    Short Term Goals: Week 1:  OT Short Term Goal 1 (Week 1): With use of AE, patient will complete LB dressing with min A OT Short Term Goal 2 (Week 1): Patient will complete toilet transfer with min A OT Short Term Goal 3 (Week 1): Patient will complete toileting task with min A  Skilled Therapeutic Interventions/Progress Updates:    Treatment session with focus on functional transfer, sit > stand, and discussion of home setup to problem solve toileting concerns.  Pt received upright in w/c agreeable to therapy session.  Pt reports concerns with accessing toilet and difficulty with hygiene prior to admission due to body habitus.  Pt reports having bidet attachment to toilet to assist with hygiene.  Pt showed pictures of both bathroom at her home as one with tub/shower with curtain and the other with sliding doors; toilets are close to vanity which may make BSC placement over toilet a challenge.  Pt declined any bathing/dressing at this time as she completed dressing during previous therapy session.  Pt agreeable to attempting standing again.  Engaged in sit > stand x3 in parallel bars with mod fading to min assist and heavy reliance on parallel bars.  Pt tolerated standing 1-2 mins per stand, with amount diminishing each time.  Pt reports excruciating pain in Lt calf in standing, most likely due to dependent position, as pt maintaining NWB when in standing.  Completed slide board transfer mod assist +2 with total assist to place slide board.  Pt with increased c/o pain in LLE during transfer due to poor positioning during transfer.  Required +2 to return to supine and position in bed for improved pain management.  Pt remained semi-reclined with all needs in reach.    Therapy  Documentation Precautions:  Precautions Precautions: Knee Precaution Comments: Bledsoe locked 15 degree flexion Other Brace: L knee hinged brace locked in 5-10' flexion (ortho does not want knee extended fully) Restrictions Weight Bearing Restrictions: Yes LLE Weight Bearing: Non weight bearing Other Position/Activity Restrictions: LLE Vital Signs: Therapy Vitals Temp: 98.1 F (36.7 C) Pulse Rate: 78 Resp: 18 BP: 93/79 Patient Position (if appropriate): Lying Oxygen Therapy SpO2: 100 % O2 Device: Room Air Pain:  Pt with c/o pain 7/10 in LLE, premedicated.   Therapy/Group: Individual Therapy  Simonne Come 03/04/2020, 3:19 PM

## 2020-03-04 NOTE — Progress Notes (Signed)
Patient Details  Name: [name] MRN: [id number] Date of Birth: [birth year]  Today's Date: 03/04/2020  Hospital Problems: Principal Problem:   Left medial tibial plateau fracture  Past Medical History: No past medical history on file. Past Surgical History:  Past Surgical History:  Procedure Laterality Date  . ORIF TIBIA PLATEAU Left 02/26/2020   Procedure: OPEN REDUCTION INTERNAL FIXATION (ORIF) TIBIAL PLATEAU;  Surgeon: Shona Needles, MD;  Location: [facility];  Service: Orthopedics;  Laterality: Left;   Social History:  reports that she has quit smoking. She has never used smokeless tobacco. She reports current alcohol use. She reports that she does not use drugs.  Family / Support Systems Marital Status: Married How Long?: 22 years Patient Roles: Spouse, Parent Spouse/Significant Other: [name] [phone]  [phone] Children: Pt has a daughter and husband has three children from first marriages-all supportive Other Supports: Friends-siblings Anticipated Caregiver: Husband Ability/Limitations of Caregiver: Min-husband is older and in relatively good health Caregiver Availability: 24/7 Family Dynamics: Close with children and extended family, pt has always been independent and plans to be again. She has learned to adapt and will do what is needed to help herself  Social History Preferred language: English Religion: Christian Cultural Background: No issues Education: HS Read: Yes Write: Yes Employment Status: Retired Public relations account executive Issues: No issues Guardian/Conservator: None-according to MD pt is capable of making her own decisions while here   Abuse/Neglect Abuse/Neglect Assessment Can Be Completed: Yes Physical Abuse: Denies Verbal Abuse: Denies Sexual Abuse: Denies Exploitation of patient/patient's resources: Denies Self-Neglect: Denies  Emotional Status Pt's affect, behavior and adjustment status: Pt is motivated to do well here and glad to be  here. She wants to be mod/i like she was prior to admission but be more mobile prior to admission she shuffled when walking and feels she can learn better techniques here Recent Psychosocial Issues: Been dealing with this leg since 2019 and the issues after surgery Psychiatric History: History of bipolar takes medications for this and feels it controls her. She would benefit from seeing neuro-psych while here for support. She is quite vocal and can express her feelings and concerns. Substance Abuse History: No issues  Patient / Family Perceptions, Expectations & Goals Pt/Family understanding of illness & functional limitations: Pt is able to explain her journey with her leg and the multiple surgeries she has had for it. She does talk with the MD daily and feels she has a good understanding of her treatment plan going forward. Premorbid pt/family roles/activities: Wife, grandmother, mom, retiree, sibling, etc Anticipated changes in roles/activities/participation: resume Pt/family expectations/goals: Pt states: " I want to be able to get around when I leave here, my husband will help but is older than me and needs to be careful."  [employer]: None Premorbid Home Care/DME Agencies: Other (Comment) (has wc, rw, quad cane, bsc) Transportation available at discharge: Husband Resource referrals recommended: Neuropsychology  Discharge Planning Living Arrangements: Spouse/significant other Support Systems: Spouse/significant other, Children, Other relatives, Friends/neighbors Type of Residence: Private residence Insurance Resources: Multimedia programmer (specify) Nurse, mental health) Financial Resources: [place], Family Support Financial Screen Referred: No Living Expenses: Own Money Management: Patient, Spouse Does the patient have any problems obtaining your medications?: No Home Management: Both she did cook and he did the cleaning Patient/Family Preliminary Plans: Return  home with husband who is able to assist her. She hopes it will not be much assistance. She is happy to be here and ready to work on therapies. Aware of  the est length of stay and goals. Care Coordinator Barriers to Discharge: Decreased caregiver support Care Coordinator Barriers to Discharge Comments: Husband can not do much lifting if needed Care Coordinator Anticipated Follow Up Needs: HH/OP  Clinical Impression Pleasant talkative female who is motivated to do well and become mobile after her surgery. She has been [place]. Aware of LOS and has a very supportive husband. Have placed on neuro-psych list to be seen while here.  Elease Hashimoto 03/04/2020, 9:15 AM

## 2020-03-05 ENCOUNTER — Inpatient Hospital Stay (HOSPITAL_COMMUNITY): Payer: BC Managed Care – PPO

## 2020-03-05 ENCOUNTER — Inpatient Hospital Stay (HOSPITAL_COMMUNITY): Payer: BC Managed Care – PPO | Admitting: Occupational Therapy

## 2020-03-05 DIAGNOSIS — K5903 Drug induced constipation: Secondary | ICD-10-CM

## 2020-03-05 DIAGNOSIS — R3915 Urgency of urination: Secondary | ICD-10-CM

## 2020-03-05 MED ORDER — LIDOCAINE 5 % EX PTCH
1.0000 | MEDICATED_PATCH | CUTANEOUS | Status: DC
  Administered 2020-03-05 – 2020-03-06 (×2): 1 via TRANSDERMAL
  Filled 2020-03-05 (×7): qty 1

## 2020-03-05 MED ORDER — POLYETHYLENE GLYCOL 3350 17 G PO PACK
17.0000 g | PACK | Freq: Every day | ORAL | Status: DC
  Filled 2020-03-05 (×2): qty 1

## 2020-03-05 NOTE — Plan of Care (Signed)
  Problem: Consults Goal: RH GENERAL PATIENT EDUCATION Description: See Patient Education module for education specifics. Outcome: Progressing Goal: Skin Care Protocol Initiated - if Braden Score 18 or less Description: If consults are not indicated, leave blank or document N/A Outcome: Progressing Goal: Nutrition Consult-if indicated Outcome: Progressing Goal: Diabetes Guidelines if Diabetic/Glucose > 140 Description: If diabetic or lab glucose is > 140 mg/dl - Initiate Diabetes/Hyperglycemia Guidelines & Document Interventions  Outcome: Progressing   Problem: RH BOWEL ELIMINATION Goal: RH STG MANAGE BOWEL WITH ASSISTANCE Description: STG Manage Bowel with min Assistance. Outcome: Progressing Goal: RH STG MANAGE BOWEL W/MEDICATION W/ASSISTANCE Description: STG Manage Bowel with Medication with min Assistance. Outcome: Progressing   Problem: RH BLADDER ELIMINATION Goal: RH STG MANAGE BLADDER WITH ASSISTANCE Description: STG Manage Bladder With min Assistance Outcome: Progressing Goal: RH STG MANAGE BLADDER WITH MEDICATION WITH ASSISTANCE Description: STG Manage Bladder With Medication With min Assistance. Outcome: Progressing Goal: RH STG MANAGE BLADDER WITH EQUIPMENT WITH ASSISTANCE Description: STG Manage Bladder With Equipment With min Assistance Outcome: Progressing   Problem: RH SKIN INTEGRITY Goal: RH STG SKIN FREE OF INFECTION/BREAKDOWN Description: No new skin breakdown this admission with supervision assist Outcome: Progressing Goal: RH STG MAINTAIN SKIN INTEGRITY WITH ASSISTANCE Description: STG Maintain Skin Integrity With min Assistance. Outcome: Progressing Goal: RH STG ABLE TO PERFORM INCISION/WOUND CARE W/ASSISTANCE Description: STG Able To Perform Incision/Wound Care With min Assistance. Outcome: Progressing   Problem: RH SAFETY Goal: RH STG ADHERE TO SAFETY PRECAUTIONS W/ASSISTANCE/DEVICE Description: STG Adhere to Safety Precautions With supervision  Assistance/Device. Outcome: Progressing Goal: RH STG DECREASED RISK OF FALL WITH ASSISTANCE Description: STG Decreased Risk of Fall With supervision Assistance. Outcome: Progressing   Problem: RH PAIN MANAGEMENT Goal: RH STG PAIN MANAGED AT OR BELOW PT'S PAIN GOAL Description: Less than 3 out of 10 Outcome: Progressing   Problem: RH KNOWLEDGE DEFICIT GENERAL Goal: RH STG INCREASE KNOWLEDGE OF SELF CARE AFTER HOSPITALIZATION Description: Pt and family will have understanding of post hospital care including medications follow up care and wound care with supervision verbal cues  Outcome: Progressing   Problem: RH Vision Goal: RH LTG Vision (Specify) Outcome: Progressing   Problem: RH Pre-functional/Other (Specify) Goal: RH LTG Pre-functional (Specify) Outcome: Progressing Goal: RH LTG Interdisciplinary (Specify) 1 Description: RH LTG Interdisciplinary (Specify)1 Outcome: Progressing Goal: RH LTG Interdisciplinary (Specify) 2 Description: RH LTG Interdisciplinary (Specify) 2  Outcome: Progressing

## 2020-03-05 NOTE — Progress Notes (Signed)
[facility] PHYSICAL MEDICINE & REHABILITATION PROGRESS NOTE   Subjective/Complaints: Patient seen laying in bed this morning.  She states initially that she did not sleep well overnight, but later states that she slept for 6 hours straight, which is the best thing she has gotten while she has been here.  She is about to work with therapies.  She notes urinary urgency.  She also notes severe pain medial to the left tibia and posteriorly.  She notes that she has not had a bowel movement yet.  Discussed with patient and nursing of the after mentioned issues.  ROS: + Constipation, left knee pain, urinary urgency.  Denies CP, SOB, N/V/D  Objective:   DG Knee 1-2 Views Left  Result Date: 03/05/2020 CLINICAL DATA:  Knee pain.  Tibial plateau fracture. EXAM: LEFT KNEE - 1-2 VIEW COMPARISON:  02/26/2020 FINDINGS: Locking intramedullary rod extending into the distal femur unchanged. Medial tibial plateau fracture with surgical fixation. Medial plate and screws. Depression of the tibial plateau unchanged from prior study. Small avulsion fracture of the lateral aspect of the lateral tibial plateau unchanged from prior studies. No new fracture. IMPRESSION: No acute findings and no change from 02/26/2020 Electronically Signed   By: Franchot Gallo M.D.   On: 03/05/2020 08:29   VAS [place] LOWER EXTREMITY VENOUS (DVT)  Result Date: 03/04/2020  Lower Venous DVT Study Indications: Swelling.  Risk Factors: Surgery Trauma. Limitations: Body habitus, poor ultrasound/tissue interface, bandages, orthopaedic appliance and patient positioning, patient immobility, patient pain tolerance. Comparison Study: No prior studies. Performing Technologist: Oliver Hum RVT  Examination Guidelines: A complete evaluation includes B-mode imaging, spectral Doppler, color Doppler, and power Doppler as needed of all accessible portions of each vessel. Bilateral testing is considered an integral part of a complete examination. Limited  examinations for reoccurring indications may be performed as noted. The reflux portion of the exam is performed with the patient in reverse Trendelenburg.  +---------+---------------+---------+-----------+----------+--------------+  RIGHT     Compressibility Phasicity Spontaneity Properties Thrombus Aging  +---------+---------------+---------+-----------+----------+--------------+  CFV       Full            Yes       Yes                                    +---------+---------------+---------+-----------+----------+--------------+  SFJ       Full                                                             +---------+---------------+---------+-----------+----------+--------------+  FV Prox   Full                                                             +---------+---------------+---------+-----------+----------+--------------+  FV Mid    Full                                                             +---------+---------------+---------+-----------+----------+--------------+  FV Distal Full                                                             +---------+---------------+---------+-----------+----------+--------------+  PFV       Full                                                             +---------+---------------+---------+-----------+----------+--------------+  POP       Full            Yes       Yes                                    +---------+---------------+---------+-----------+----------+--------------+  PTV       Full                                                             +---------+---------------+---------+-----------+----------+--------------+  PERO      Full                                                             +---------+---------------+---------+-----------+----------+--------------+   +---------+---------------+---------+-----------+----------+-------------------+  LEFT      Compressibility Phasicity Spontaneity Properties Thrombus Aging        +---------+---------------+---------+-----------+----------+-------------------+  CFV       Full            Yes       Yes                                         +---------+---------------+---------+-----------+----------+-------------------+  SFJ       Full                                                                  +---------+---------------+---------+-----------+----------+-------------------+  FV Prox   Full                                                                  +---------+---------------+---------+-----------+----------+-------------------+  FV Mid    Full                                                                  +---------+---------------+---------+-----------+----------+-------------------+  FV Distal                 Yes       Yes                                         +---------+---------------+---------+-----------+----------+-------------------+  PFV       Full                                                                  +---------+---------------+---------+-----------+----------+-------------------+  POP                                                        Not well visualized  +---------+---------------+---------+-----------+----------+-------------------+  PTV       Full                                                                  +---------+---------------+---------+-----------+----------+-------------------+  PERO                                                       Not well visualized  +---------+---------------+---------+-----------+----------+-------------------+     Summary: RIGHT: - There is no evidence of deep vein thrombosis in the lower extremity. However, portions of this examination were limited- see technologist comments above.  - No cystic structure found in the popliteal fossa.  LEFT: - There is no evidence of deep vein thrombosis in the lower extremity. However, portions of this examination were limited- see technologist comments above.  - No cystic  structure found in the popliteal fossa.  *See table(s) above for measurements and observations. Electronically signed by Servando Snare MD on 03/04/2020 at 4:33:35 PM.    Final    Recent Labs    03/04/20 0721  WBC 7.2  HGB 11.7*  HCT 35.9*  PLT 304   Recent Labs    03/04/20 0721  NA 134*  K 3.9  CL 99  CO2 27  GLUCOSE 121*  BUN 8  CREATININE 0.54  CALCIUM 8.7*    Intake/Output Summary (Last 24 hours) at 03/05/2020 0914 Last data filed at 03/05/2020 0719 Gross per 24 hour  Intake 700 ml  Output 2325 ml  Net -1625 ml        Physical Exam: Vital Signs Blood pressure 137/68, pulse 64, temperature 97.9 F (36.6 C), temperature source Oral, resp. rate 18, height 5\' 8"  (1.727 m), weight (!) 143.2 kg, SpO2 98 %. Constitutional: No distress . Vital signs reviewed. HENT: Normocephalic.  Atraumatic. Eyes: EOMI. No discharge. Cardiovascular: No JVD.  RRR. Respiratory: Normal effort.  No stridor.  Bilateral clear to auscultation. GI: Non-distended.  BS +. Skin: Warm and dry.  Left knee with incision CDI Psych: Slightly anxious.  Normal behavior. Musc: Left knee with edema and tenderness, more significant medially and posteriorly. Neuro:  Alert Motor: Bilateral upper extremities: 5/5 proximal distal Right lower extremity: 4+/5 proximal distal Left lower extremity: Hip flexion 2+/5 (some pain inhibition), knee brace, ankle dorsiflexion 4+/5  Sensation diminished light touch left toes  Assessment/Plan: 1. Functional deficits which require 3+ hours per day of interdisciplinary therapy in a comprehensive inpatient rehab setting.  Physiatrist is providing close team supervision and 24 hour management of active medical problems listed below.  Physiatrist and rehab team continue to assess barriers to discharge/monitor patient progress toward functional and medical goals  Care Tool:  Bathing    Body parts bathed by patient: Right arm, Left arm, Chest, Face   Body parts bathed  by helper: Front perineal area, Buttocks     Bathing assist Assist Level: Minimal Assistance - Patient > 75%     Upper Body Dressing/Undressing Upper body dressing   What is the patient wearing?: Hospital gown only    Upper body assist Assist Level: Minimal Assistance - Patient > 75%    Lower Body Dressing/Undressing Lower body dressing    Lower body dressing activity did not occur:  (Pt had just completed hygiene with NT prior to OT arrival.)       Lower body assist Assist for lower body dressing: 2 Helpers     Toileting Toileting    Toileting assist Assist for toileting: Maximal Assistance - Patient 25 - 49%     Transfers Chair/bed transfer  Transfers assist     Chair/bed transfer assist level: 2 Helpers     Locomotion Ambulation   Ambulation assist   Ambulation activity did not occur: Safety/medical concerns          Walk 10 feet activity   Assist  Walk 10 feet activity did not occur: Safety/medical concerns        Walk 50 feet activity   Assist Walk 50 feet with 2 turns activity did not occur: Safety/medical concerns         Walk 150 feet activity   Assist Walk 150 feet activity did not occur: Safety/medical concerns         Walk 10 feet on uneven surface  activity   Assist Walk 10 feet on uneven surfaces activity did not occur: Safety/medical concerns         Wheelchair     Assist Will patient use wheelchair at discharge?: Yes Type of Wheelchair: Manual Wheelchair activity did not occur: Safety/medical concerns         Wheelchair 50 feet with 2 turns activity    Assist    Wheelchair 50 feet with 2 turns activity did not occur: Safety/medical concerns       Wheelchair 150 feet activity     Assist  Wheelchair 150 feet activity did not occur: Safety/medical concerns       Blood pressure 137/68, pulse 64, temperature 97.9 F (36.6 C), temperature source Oral, resp. rate 18, height 5\' 8"  (1.727  m), weight (!) 143.2 kg, SpO2 98 %.  Medical Problem List and Plan: 1.  Decreased functional mobility secondary to left tibial plateau fracture.  Status post ORIF 02/25/2020.  Nonweightbearing left lower extremity with hinged knee brace locked in 5 to 10 degree flexion.  Ortho does not want knee extended fully  Continue CIR  Knee x-ray ordered 2.  Antithrombotics: -DVT/anticoagulation: Lovenox.  Vascular studies reviewed, negative for DVT             -antiplatelet therapy: N/A 3. Pain Management:    Oxycodone and Robaxin as needed  Increased gabapentin to home dose 900 mg BID   Lidoderm patch added on 11/16             Monitor with increased exertion 4. Mood: Provide emotional support             -antipsychotic agents: N/A 5. Neuropsych: This patient is capable of making decisions on her own behalf. 6. Skin/Wound Care: Routine skin checks 7. Fluids/Electrolytes/Nutrition: Routine in and outs.   8.  Acute blood loss anemia.               Hb 11.7 on 11/15  Cont to monitor  9.  Hypertension.  Norvasc 5 mg daily.   Relatively controlled on 11/16             Monitor with increased mobility. 10.  History of alcohol tobacco use.  Provide counseling 11.  Morbid obesity.  BMI 48.00 Encourage weight loss.  Dietary follow-up 12. Pruritis  Benadryl 25 mg q6 hours prn  Improving 13. Hyponatremia  Na+ 134 on 11/15  Cont to monitor 14. Hypoalbuminemia  Supplement initiated on 11/15 15.  Drug-induced constipation  Bowel meds increased on 11/16 16.  Urinary urgency  PVRs ordered  Will consider UA/culture  LOS: 4 days A FACE TO FACE EVALUATION WAS PERFORMED  Larene Ascencio Lorie Phenix 03/05/2020, 9:14 AM

## 2020-03-05 NOTE — Progress Notes (Signed)
Physical Therapy Session Note  Patient Details  Name: [name] MRN: [id number] Date of Birth: 1955-08-24  Today's Date: 03/05/2020 PT Individual Time: 2706-2376 PT Individual Time Calculation (min): 68 min   Short Term Goals: Week 1:  PT Short Term Goal 1 (Week 1): supine to/from sit w/supervision PT Short Term Goal 2 (Week 1): sliding board transfers w/level surfaces w/min assist of 1 PT Short Term Goal 3 (Week 1): sit to stand in parallel bars or w/bari walker w/mod assist NWBing LLE PT Short Term Goal 4 (Week 1): pt will maintain NWBing in standing w/LRAD x >1 min  Skilled Therapeutic Interventions/Progress Updates:   Received pt supine in bed, pt agreeable to therapy, and reported pain 4/10 in LLE (premedicated). Repositioning, rest breaks, and distraction done to reduce pain levels. Pt continues to remain verbose and required increased time with mobility due to pain and due to pt's tendency to direct her care. Session with emphasis on functional mobility/transfers, generalized strengthening, dynamic standing balance/coordination, dressing, and improved activity tolerance. Pt performed bed mobility with supervision using gait belt as leg lifter x 2 trials throughout session. Pt transferred bed<>WC and WC<> bed via slideboard with min A +2 for safety x 2 trials throughout session with cues for anterior weight shifting, hand placement, and scooting technique. Pt transported to therapy gym in [facility] total A for time management purposes and pt transferred sit<>stand inside // bars x 5 trials with min A overall while adhering to LLE NWB precautions. Pt able to remain standing ~1-1.5 minutes each trial with CGA for balance. While standing pt able to lift RUE from parallel bars x10 reps but with increased difficulty lifting LLE due to balance deficits. Pt reported that she urinated in her brief but requested to continue standing. Therapist strongly encouraged returning to room for clean up to maintain  skin integrity and pt finally agreeable. Returned to room in [facility] and returned to bed. Doffed soiled brief and pants with max A and pt doffed shirt and donned clean gown with supervision. Therapist assisted with set up with female urinal for pt to urinate x 2 attempts, however pt ultimately unsuccessful. Therapist assisted with repositioning Bledsoe brace total A and educated pt on proper positoning. Concluded session with pt supine in bed, needs within reach, and bed alarm on. Therapist assisted with positioning in bed for comfort and provided fresh ice water.   Therapy Documentation Precautions:  Precautions Precautions: Knee Precaution Comments: Bledsoe locked 15 degree flexion Other Brace: L knee hinged brace locked in 5-10' flexion (ortho does not want knee extended fully) Restrictions Weight Bearing Restrictions: Yes LLE Weight Bearing: Non weight bearing Other Position/Activity Restrictions: LLE   Therapy/Group: Individual Therapy Alfonse Alpers PT, DPT   03/05/2020, 7:36 AM

## 2020-03-05 NOTE — Progress Notes (Signed)
Occupational Therapy Session Note  Patient Details  Name: [name] MRN: [id number] Date of Birth: 03-15-1956  Today's Date: 03/05/2020 OT Individual Time: 0756-0900  &  1000-1100 OT Individual Time Calculation (min): 64 min  &  60 min   Short Term Goals: Week 1:  OT Short Term Goal 1 (Week 1): With use of AE, patient will complete LB dressing with min A OT Short Term Goal 2 (Week 1): Patient will complete toilet transfer with min A OT Short Term Goal 3 (Week 1): Patient will complete toileting task with min A  Skilled Therapeutic Interventions/Progress Updates:    Session 1:   Patient in bed, alert and ready for therapy session.  She notes pain in left leg at level 7.  Supine to sitting edge of bed with leg loop to assist with left leg CS/CGA.  Attempted to stand from bed surface but unable.  Returned to supine with min A.  Provided drop arm bariatric commode and reviewed technique for SB transfer.  Supine to sit again CS/CGA.  SB transfer bed to commode with min A (+2 to ensure safety).  She completed bathing with set up, UB dressing with set up, continent episode of bowel and bladder - max A for clothing management and hygiene, max A for LB dressing.  SB transfer commode to w/c min A (+2 for safety)  She completed oral care seated at this sink mod I.  She remained seated in w/c at close of session, call bell and tray table in reach.     Session 2:   Patient in w/c, ongoing pain in left leg/knee.  Completed hair washing with shampoo tray.  She is able to comb/brush with set up.  Completed UB ergometer x 10 minutes, theraband exercises with good tolerance.   SB transfer w/c to bed, mod A +2 due to difficulty with placement of board and getting caught on clothing.  Sit to supine mod A.  She is able to roll right with min A (holding/guiding left leg) and rolling left with min A at trunk.  She remained in bed at close of session, call bell/tray table in reach, bed alarm set and ice packs for left  knee.      Therapy Documentation Precautions:  Precautions Precautions: Knee Precaution Comments: Bledsoe locked 15 degree flexion Other Brace: L knee hinged brace locked in 5-10' flexion (ortho does not want knee extended fully) Restrictions Weight Bearing Restrictions: Yes LLE Weight Bearing: Non weight bearing Other Position/Activity Restrictions: LLE   Therapy/Group: Individual Therapy  Carlos Levering 03/05/2020, 7:38 AM

## 2020-03-06 ENCOUNTER — Inpatient Hospital Stay (HOSPITAL_COMMUNITY): Payer: BC Managed Care – PPO | Admitting: Occupational Therapy

## 2020-03-06 ENCOUNTER — Inpatient Hospital Stay (HOSPITAL_COMMUNITY): Payer: BC Managed Care – PPO

## 2020-03-06 DIAGNOSIS — F411 Generalized anxiety disorder: Secondary | ICD-10-CM

## 2020-03-06 MED ORDER — CLONAZEPAM 0.25 MG PO TBDP
0.2500 mg | ORAL_TABLET | Freq: Two times a day (BID) | ORAL | Status: DC | PRN
  Administered 2020-03-06 – 2020-03-07 (×2): 0.25 mg via ORAL
  Filled 2020-03-06 (×3): qty 1

## 2020-03-06 NOTE — Progress Notes (Signed)
[facility] PHYSICAL MEDICINE & REHABILITATION PROGRESS NOTE   Subjective/Complaints: Patient seen sitting up in bed this morning.  She states she did not sleep well overnight due to anxiety.  She notes she had a bowel movement yesterday.  She requests a sleep/anxiety aid.  She notes pruritus due to perspiration after therapies under brace.  Discussed results of x-ray findings.  ROS: Denies CP, SOB, N/V/D  Objective:   DG Knee 1-2 Views Left  Result Date: 03/05/2020 CLINICAL DATA:  Knee pain.  Tibial plateau fracture. EXAM: LEFT KNEE - 1-2 VIEW COMPARISON:  02/26/2020 FINDINGS: Locking intramedullary rod extending into the distal femur unchanged. Medial tibial plateau fracture with surgical fixation. Medial plate and screws. Depression of the tibial plateau unchanged from prior study. Small avulsion fracture of the lateral aspect of the lateral tibial plateau unchanged from prior studies. No new fracture. IMPRESSION: No acute findings and no change from 02/26/2020 Electronically Signed   By: Franchot Gallo M.D.   On: 03/05/2020 08:29   Recent Labs    03/04/20 0721  WBC 7.2  HGB 11.7*  HCT 35.9*  PLT 304   Recent Labs    03/04/20 0721  NA 134*  K 3.9  CL 99  CO2 27  GLUCOSE 121*  BUN 8  CREATININE 0.54  CALCIUM 8.7*    Intake/Output Summary (Last 24 hours) at 03/06/2020 1038 Last data filed at 03/06/2020 0700 Gross per 24 hour  Intake 460 ml  Output 4650 ml  Net -4190 ml        Physical Exam: Vital Signs Blood pressure (!) 136/59, pulse (!) 59, temperature 98 F (36.7 C), resp. rate 20, height 5\' 8"  (1.727 m), weight (!) 143.2 kg, SpO2 100 %. Constitutional: No distress . Vital signs reviewed. HENT: Normocephalic.  Atraumatic. Eyes: EOMI. No discharge. Cardiovascular: No JVD.  RRR. Respiratory: Normal effort.  No stridor.  Bilateral clear to auscultation. GI: Non-distended.  BS +. Skin: Warm and dry.  Left knee with incision CDI Psych: Anxious. Musc: Left knee  with edema and tenderness. Neuro:  Alert Motor: Bilateral upper extremities: 5/5 proximal distal Right lower extremity: 4+/5 proximal distal Left lower extremity: Hip flexion 2+/5 (some pain inhibition), knee brace, ankle dorsiflexion 4+/5  Sensation diminished light touch now over dorsal foot  Assessment/Plan: 1. Functional deficits which require 3+ hours per day of interdisciplinary therapy in a comprehensive inpatient rehab setting.  Physiatrist is providing close team supervision and 24 hour management of active medical problems listed below.  Physiatrist and rehab team continue to assess barriers to discharge/monitor patient progress toward functional and medical goals  Care Tool:  Bathing    Body parts bathed by patient: Right arm, Left arm, Chest, Face   Body parts bathed by helper: Front perineal area, Buttocks     Bathing assist Assist Level: Minimal Assistance - Patient > 75%     Upper Body Dressing/Undressing Upper body dressing   What is the patient wearing?: Pull over shirt    Upper body assist Assist Level: Set up assist    Lower Body Dressing/Undressing Lower body dressing    Lower body dressing activity did not occur:  (Pt had just completed hygiene with NT prior to OT arrival.) What is the patient wearing?: Underwear/pull up, Pants     Lower body assist Assist for lower body dressing: Maximal Assistance - Patient 25 - 49%     Toileting Toileting    Toileting assist Assist for toileting: Maximal Assistance - Patient 25 -  49%     Transfers Chair/bed transfer  Transfers assist     Chair/bed transfer assist level: 2 Helpers     Locomotion Ambulation   Ambulation assist   Ambulation activity did not occur: Safety/medical concerns          Walk 10 feet activity   Assist  Walk 10 feet activity did not occur: Safety/medical concerns        Walk 50 feet activity   Assist Walk 50 feet with 2 turns activity did not occur:  Safety/medical concerns         Walk 150 feet activity   Assist Walk 150 feet activity did not occur: Safety/medical concerns         Walk 10 feet on uneven surface  activity   Assist Walk 10 feet on uneven surfaces activity did not occur: Safety/medical concerns         Wheelchair     Assist Will patient use wheelchair at discharge?: Yes Type of Wheelchair: Manual Wheelchair activity did not occur: Safety/medical concerns  Wheelchair assist level: Supervision/Verbal cueing Max wheelchair distance: 164ft    Wheelchair 50 feet with 2 turns activity    Assist    Wheelchair 50 feet with 2 turns activity did not occur: Safety/medical concerns   Assist Level: Supervision/Verbal cueing   Wheelchair 150 feet activity     Assist  Wheelchair 150 feet activity did not occur: Safety/medical concerns       Blood pressure (!) 136/59, pulse (!) 59, temperature 98 F (36.7 C), resp. rate 20, height 5\' 8"  (1.727 m), weight (!) 143.2 kg, SpO2 100 %.  Medical Problem List and Plan: 1.  Decreased functional mobility secondary to left tibial plateau fracture.  Status post ORIF 02/25/2020.  Nonweightbearing left lower extremity with hinged knee brace locked in 5 to 10 degree flexion.  Ortho does not want knee extended fully  Continue CIR  Knee x-ray showing postoperative changes  Team conference today to discuss current and goals and coordination of care, home and environmental barriers, and discharge planning with nursing, case manager, and therapies. Please see conference note from today as well.  2.  Antithrombotics: -DVT/anticoagulation: Lovenox.    Vascular studies reviewed, negative for DVT             -antiplatelet therapy: N/A 3. Pain Management:    Oxycodone and Robaxin as needed  Increased gabapentin to home dose 900 mg BID   Lidoderm patch added on 11/16  Relatively controlled with meds on 11/17             Monitor with increased exertion 4. Mood:  Provide emotional support   Klonopin 0.25 twice daily as needed started on 11/17             -antipsychotic agents: N/A 5. Neuropsych: This patient is capable of making decisions on her own behalf. 6. Skin/Wound Care: Routine skin checks 7. Fluids/Electrolytes/Nutrition: Routine in and outs.   8.  Acute blood loss anemia.               Hb 11.7 on 11/15  Cont to monitor  9.  Hypertension.  Norvasc 5 mg daily.   Relatively controlled on 11/17             Monitor with increased mobility. 10.  History of alcohol tobacco use.  Provide counseling 11.  Morbid obesity.  BMI 48.00 Encourage weight loss.  Dietary follow-up 12. Pruritis  Benadryl 25 mg q6 hours prn  Included  hygiene under straps for braces  Anxiety component as well 13. Hyponatremia  Na+ 134 on 11/15  Cont to monitor 14. Hypoalbuminemia  Supplement initiated on 11/15 15.  Drug-induced constipation  Bowel meds increased on 11/16, DC'd on 11/17  Improving 16.  Urinary urgency  PVRs relatively unremarkable  Will consider UA/culture  Will monitor with addition of Klonopin  LOS: 5 days A FACE TO FACE EVALUATION WAS PERFORMED  Gregoria Selvy Lorie Phenix 03/06/2020, 10:38 AM

## 2020-03-06 NOTE — Progress Notes (Signed)
Occupational Therapy Session Note  Patient Details  Name: [name] MRN: [id number] Date of Birth: 11/02/1955  Today's Date: 03/06/2020 OT Individual Time: 1015-1105 OT Individual Time Calculation (min): 50 min    Short Term Goals: Week 1:  OT Short Term Goal 1 (Week 1): With use of AE, patient will complete LB dressing with min A OT Short Term Goal 2 (Week 1): Patient will complete toilet transfer with min A OT Short Term Goal 3 (Week 1): Patient will complete toileting task with min A Week 2:     Skilled Therapeutic Interventions/Progress Updates:    1:! Pt received in the w/c after talking with sx PA about her case and concerns. Her left Marin Comment continues to be swollen and painful especially with prolonged standing where it is dependent (even for short amt of time). Focus on sit to stands from w/c with min A with A from behind. Pt able to perform 3 sit to stands. Discussed her goals for herself while she is here and her goals for home. Pt reports not being able to access her home bathroom with her RW and will be using her BSc outside of the bathroom. Discussed stand pivot transfers and slide board transfers and progression of transfer. Left pt up in the recliner with left Le elevated with ice on it.    2nd session 14:00-15:00  1:1 Continued practice with sit to stands from w/c. Attempted a squat pivot transfer w/c to but pt unable to clear buttocks and it was more a scoot than a squat. W/c cushions adjusted for more of a supportive surface (a firmer cushion with an apple board underneath as well as an air dex for elevation. Pt able to perform sit to stand from the mat with min A and was able to perform a stand pivot transfer into the w/c with min A with instructional cues for sequencing the RW. Then performed stand pivot to the bed with min A again with mod cues for Rw safety . Pt performed siting to supine with leg loop (with gait belt) with supervision. Assisted pt with hygiene and changing  brief after incontinence due to urgency. Pt able to roll both ways with supervision. Doffed clothing and left in bed with gown and call bell next to her.   Therapy Documentation Precautions:  Precautions Precautions: Knee Precaution Comments: Bledsoe locked 15 degree flexion Other Brace: L knee hinged brace locked in 5-10' flexion (ortho does not want knee extended fully) Restrictions Weight Bearing Restrictions: Yes LLE Weight Bearing: Non weight bearing Other Position/Activity Restrictions: LLE General: General OT Amount of Missed Time: 10 Minutes   Pain: Pain Assessment Pain Scale: 0-10 Pain Score: 9  Pain Type: Acute pain Pain Location: Knee Pain Orientation: Left Pain Descriptors / Indicators: Aching Pain Frequency: Constant Pain Onset: On-going Pain Intervention(s): Medication (See eMAR) (oxycodone given)- allowed for rest breaks as needed and applied ice to left Le at end of session    Therapy/Group: Individual Therapy  [name] [facility] 03/06/2020, 1:33 PM

## 2020-03-06 NOTE — Progress Notes (Signed)
Orthopaedic Trauma Progress Note  SUBJECTIVE: Has been doing well with therapies.  Continues to have significant swelling throughout the left lower leg.  Venous ultrasound was negative for DVT.  Also notes some itching in the leg due to the amount of swelling.  Very motivated to regain her strength.  OBJECTIVE:  Vitals:   03/05/20 1954 03/06/20 0252  BP: (!) 109/59 (!) 136/59  Pulse: 73 (!) 59  Resp:    Temp: 98.5 F (36.9 C) 98 F (36.7 C)  SpO2: 97% 100%    General: Sitting up in wheelchair, no acute distress Respiratory:  No increased work of breathing.  Left Lower Extremity: Bledsoe brace in place locked in 10 degrees of flexion.  Incision clean, dry, intact with steri-strips in place. Continues to have significant swelling throughout the lower leg, ankle, and foot.  Tenderness with palpation of the lower leg anteriorly and posteriorly.  Endorses discomfort with ankle dorsiflexion both actively and passively.  Sensation intact distally. Able to wiggle toes. Foot warm and well-perfused. Compartments soft and compressible. Otherwise neurovascularly intact  IMAGING: Stable post op imaging.   LABS: No results found for this or any previous visit (from the past 24 hour(s)).  ASSESSMENT: [name] is a 64 y.o. female s/p fall Injuries:1.  Left medial tibial plateau fracture s/p ORIF 02/26/2020 2. Left PCL/LCL injury 3. Left knee posterior capsular disruption  PLAN: Weightbearing: NWB LLE.  Okay for knee flexion in the brace as tolerated Incisional and dressing care: Okay to leave incisions open to air Showering: Okay to shower with assistance Orthopedic device(s): Hinged knee brace RLE locked in 10 degrees flexion  Pain management: Continue current regimen VTE prophylaxis: Lovenox Impediments to Fracture Healing: Vitamin D level 18, continue home dose vitamin D supplementation Dispo: Continue with therapies as tolerated.  We recommend wrapping left leg from toes to above knee  with Ace wrap to help with swelling.  Will plan to keep hinged knee brace locked with about 10 degrees of flexion to prevent any hyperextension of the knee over the next several weeks.  Plan for repeat x-rays next week Follow - up plan: We will continue to follow while in the hospital and plan for outpatient follow-up 2 weeks after hospital discharge for repeat x-rays  Contact information:  Katha Hamming MD, Patrecia Pace PA-C. After hours and holidays please check [email or web address] for group call information for [facility]   Shaleka Brines A. Ricci Barker, PA-C [phone] (office) [email or web address]

## 2020-03-06 NOTE — Patient Care Conference (Signed)
Inpatient RehabilitationTeam Conference and Plan of Care Update Date: 03/06/2020   Time: 11:51 AM    Patient Name: [name]      Medical Record Number: [id number]  Date of Birth: 1955/12/02 Sex: Female         Room/Bed: 4W14C/4W14C-01 Payor Info: Payor: [place] / Plan: [employer] COMM PPO / Product Type: *No Product type* /    Admit Date/Time:  03/01/2020  9:26 PM  Primary Diagnosis:  Left medial tibial plateau fracture  Hospital Problems: Principal Problem:   Left medial tibial plateau fracture Active Problems:   Post-operative pain   Benign essential HTN   Pruritus   Urinary urgency   Drug induced constipation   Anxiety state    Expected Discharge Date: Expected Discharge Date: 03/16/20  Team Members Present: Physician leading conference: Dr. Delice Lesch Care Coodinator Present: Ovidio Kin, LCSW;Kiondre Grenz Hervey Ard, RN, BSN, CRRN Nurse Present: Annita Brod, LPN PT Present: Becky Sax, PT OT Present: [name], OT PPS Coordinator present : Gunnar Fusi, Novella Olive, PT     Current Status/Progress Goal Weekly Team Focus  Bowel/Bladder   Pt is continent of bowel and bladder. Lbm-11/16. Does have some urgency but PVR's showed pt is emptying out.  To remain continent of bowel/bladder.  Assess toileting needs prn   Swallow/Nutrition/ Hydration             ADL's             Mobility   supine<>sit supervision with gait belt as leg lifter, sit<>supine mod +2, bed<>WC slideboard mod A +2, sit<>stand in // bars min/mod A +2 for safety.  Mod I WC mob, min A standing/gait  functional mobility/transfers, generalized strengthening, dynamic standing balance/coordination, pain control, improved endurance   Communication             Safety/Cognition/ Behavioral Observations            Pain   Has constant pain, spasms to left knee/leg. Pt can get oxy q4 and robaxin q6 prn.  To decrease pain level below 2/10.  Assess pain q shift or prn.   Skin    Incision to left knee with adhesive strips over it. Also has MASD/rash like areas underneath breast which she gets destin cream for and areas look almost healed.  To promote healing and preventing further skin breakdown from occurring.  Assess skin q shift or prn.     Discharge Planning:  Home with elderly husband who can assist but can't lift. Pt is motivated to do well and is pushing through her pain with her therapies   Team Discussion: Progress limited by anxiety; puritis and urinary urgency. MD ordered medication for anxiety. Pain in left knee and edema of leg also limits activity. Patient on target to meet rehab goals: yes, Mod I wheelchair goals and min assist gait goals. Currently CGA +2 for safety with transfers and position of slideboard from staff.  *See Care Plan and progress notes for long and short-term goals.   Revisions to Treatment Plan:  Practicing stand pivot transfers/sit to stand exercises Teaching Needs: Transfers, toileting, medications, skin care, etc.  Current Barriers to Discharge: Home enviroment access/layout  Possible Resolutions to Barriers: DME recommended for toileting as patient will not have access to the bathroom at discharge     Medical Summary Current Status: Decreased functional mobility secondary to left tibial plateau fracture.  Status post ORIF 02/25/2020.  Nonweightbearing left lower extremity with hinged knee brace locked in 5 to  10 degree flexion.  Barriers to Discharge: Medical stability;Behavior   Possible Resolutions to [employer]: Therapies, optimize pain, anxiety meds, follow labs - Na, Hb, optimize bowel meds   Continued Need for Acute Rehabilitation Level of Care: The patient requires daily medical management by a physician with specialized training in physical medicine and rehabilitation for the following reasons: Direction of a multidisciplinary physical rehabilitation program to maximize functional independence :  Yes Medical management of patient stability for increased activity during participation in an intensive rehabilitation regime.: Yes Analysis of laboratory values and/or radiology reports with any subsequent need for medication adjustment and/or medical intervention. : Yes   I attest that I was present, lead the team conference, and concur with the assessment and plan of the team.   Dorien Chihuahua B 03/06/2020, 12:44 PM

## 2020-03-06 NOTE — Progress Notes (Addendum)
Physical Therapy Session Note  Patient Details  Name: [name] MRN: [id number] Date of Birth: 1955-05-27  Today's Date: 03/06/2020 PT Individual Time: 8338-2505 and [id number]  PT Individual Time Calculation (min): 57 min and 25 min  Short Term Goals: Week 1:  PT Short Term Goal 1 (Week 1): supine to/from sit w/supervision PT Short Term Goal 2 (Week 1): sliding board transfers w/level surfaces w/min assist of 1 PT Short Term Goal 3 (Week 1): sit to stand in parallel bars or w/bari walker w/mod assist NWBing LLE PT Short Term Goal 4 (Week 1): pt will maintain NWBing in standing w/LRAD x >1 min  Skilled Therapeutic Interventions/Progress Updates:   Treatment Session 1: 9379-0240 57 min Received pt supine in bed with NT changing brief, PT took over with care, pt agreeable to therapy, and reported pain 9/10 in LLE (premedicated). Repositioning, rest breaks, and distraction done to reduce pain levels. Session with emphasis on dressing, functional mobility/transfers, generalized strengthening, dynamic standing balance/coordination, and improved activity tolerance. Noted wit with continued anxiety throughout session requiring maximal encouragement and praising during session. Pt also required frequent rest/water breaks throughout session due to pain and decreased activity tolerance. Pt rolled L and R with supervision/CGA and use of bedrails and required total A to doff soiled brief and don clean one. Donned pants in supine with max A and rolled L and R again with supervision and able to pull pants over hips with supervision and increased time. Pt transferred supine<>sitting EOB using gait belt as leg lifter with supervision. Pt verbose this morning discussing her bad experience with one of the nurses this morning. Pt transferred bed<>WC via slideboard with CGA (+2 for safety) with total A to place board. RN present to administer lidocane patches. Pt repositioned in [place] with pillows for comfort and  performed WC mobility 141ft using BUE to dayroom with supervision and 1 rest break. CSW present during session and discussion had regarding equipment needed. WC<>mat and mat<>WC via slideboard with min A and total A to place board with +2 assist for safety and to stabilize equipment. Pt transferred sit<>stand with RW and min A +2 for safety x 3 trials from elevated mat. Pt able to remain standing ~ 45 seconds prior to sitting. Noted bledsoe brace slipping off requring total A form therapist to reposition. Pt transported back to room in [facility] total A. Concluded session with pt sitting in [facility] and needs within reach awaiting OT session.   Treatment Session 2: 9735-3299 25 min Received pt sitting in WC, pt agreeable to therapy, and reported pain 7/10 in LLE (premedicated). Repositioning, rest breaks, and distraction done to reduce pain levels. Session with emphasis on functional mobility/transfers, generalized strengthening, dynamic standing balance/coordination, and improved activity tolerance. Pt requiredmultiple attempts to stand from [facility] and was finally successful on 3rd and 4th attempt with mod A+2. Pt reported sharp pain in RLE, thus resulting in pt returning to sitting. Pt very frustrated and became tearful as pt is very hard on herself. Educated pt on recovery timeline and expectations from therapy and pt verbalized feeling better. Pt able to maintain static standing balance with CGA for ~1 minute while maintaining LLE NWB precautions prior to requesting to sit. Pt requested therapist provide her with exercises to do in bed on her RLE in between therapies and posted in room for pt to see. Educated pt on technique, frequency, and duration for the following exercises: -SLR 3x10 -hip abduction 3x10 -heel slides 3x10 Concluded session with pt sitting  in [facility] with needs within reach.  Therapy Documentation Precautions:  Precautions Precautions: Knee Precaution Comments: Bledsoe locked 15 degree flexion Other  Brace: L knee hinged brace locked in 5-10' flexion (ortho does not want knee extended fully) Restrictions Weight Bearing Restrictions: Yes LLE Weight Bearing: Non weight bearing Other Position/Activity Restrictions: LLE   Therapy/Group: Individual Therapy Alfonse Alpers PT, DPT   03/06/2020, 7:24 AM

## 2020-03-06 NOTE — Progress Notes (Signed)
Patient ID: [name], female   DOB: 23-Jun-1955, 64 y.o.   MRN: [id number]  Met with pt to discuss team conference goals mod/i wheelchair level and min assist with standing/transfers. Target discharge date 11/27. Pt is agreeable to this and trying hard in therapies, but still is having much swelling and pain in her leg. Need to make sure husband can manage her at discharge at the level she will be.

## 2020-03-07 ENCOUNTER — Inpatient Hospital Stay (HOSPITAL_COMMUNITY): Payer: BC Managed Care – PPO | Admitting: Occupational Therapy

## 2020-03-07 ENCOUNTER — Inpatient Hospital Stay (HOSPITAL_COMMUNITY): Payer: BC Managed Care – PPO

## 2020-03-07 DIAGNOSIS — S82141A Displaced bicondylar fracture of right tibia, initial encounter for closed fracture: Secondary | ICD-10-CM

## 2020-03-07 DIAGNOSIS — R52 Pain, unspecified: Secondary | ICD-10-CM

## 2020-03-07 MED ORDER — DIPHENHYDRAMINE HCL 25 MG PO CAPS
50.0000 mg | ORAL_CAPSULE | Freq: Every evening | ORAL | Status: DC | PRN
  Administered 2020-03-07 – 2020-03-15 (×9): 50 mg via ORAL
  Filled 2020-03-07 (×9): qty 2

## 2020-03-07 MED ORDER — DIPHENHYDRAMINE HCL 25 MG PO CAPS
25.0000 mg | ORAL_CAPSULE | Freq: Three times a day (TID) | ORAL | Status: DC | PRN
  Administered 2020-03-08 – 2020-03-16 (×6): 25 mg via ORAL
  Filled 2020-03-07 (×7): qty 1

## 2020-03-07 MED ORDER — DARIFENACIN HYDROBROMIDE ER 7.5 MG PO TB24
7.5000 mg | ORAL_TABLET | Freq: Every day | ORAL | Status: DC
  Administered 2020-03-08: 7.5 mg via ORAL
  Filled 2020-03-07 (×2): qty 1

## 2020-03-07 MED ORDER — MELATONIN 5 MG PO TABS
10.0000 mg | ORAL_TABLET | Freq: Every day | ORAL | Status: DC
  Administered 2020-03-07 – 2020-03-15 (×9): 10 mg via ORAL
  Filled 2020-03-07 (×9): qty 2

## 2020-03-07 NOTE — Progress Notes (Signed)
Physical Therapy Weekly Progress Note  Patient Details  Name: [name] MRN: [id number] Date of Birth: 1955-08-31  Beginning of progress report period: March 02, 2020 End of progress report period: March 07, 2020  Patient has met 3 of 4 short term goals. Pt demonstrates steady progress towards long term goals. Pt is currently able to perform bed mobility with supervision and use of gait belt as leg lifter, slideboard transfers with CGA/min A +2 (for safety and to stabilize equipment), sit<>stand with RW from [facility] with min/mod A +2 and from elevated surfaces with min A of 1, stand<>pivot transfer with RW and min A +2 for safety, and WC mobility up to 173f with supervision. Pt continues to be limited by high pain levels, anxiety, body habitus, poor endurance, and generalized weakness. However, pt remains extremely motivated to participate in therapy sessions.   Patient continues to demonstrate the following deficits muscle weakness, decreased cardiorespiratoy endurance and decreased standing balance, decreased postural control, decreased balance strategies and difficulty maintaining precautions and therefore will continue to benefit from skilled PT intervention to increase functional independence with mobility.  Patient progressing toward long term goals..  Continue plan of care.  PT Short Term Goals Week 1:  PT Short Term Goal 1 (Week 1): supine to/from sit w/supervision PT Short Term Goal 1 - Progress (Week 1): Met PT Short Term Goal 2 (Week 1): sliding board transfers w/level surfaces w/min assist of 1 PT Short Term Goal 2 - Progress (Week 1): Progressing toward goal PT Short Term Goal 3 (Week 1): sit to stand in parallel bars or w/bari walker w/mod assist NWBing LLE PT Short Term Goal 3 - Progress (Week 1): Met PT Short Term Goal 4 (Week 1): pt will maintain NWBing in standing w/LRAD x >1 min PT Short Term Goal 4 - Progress (Week 1): Met Week 2:  PT Short Term Goal 1 (Week 2): STG=LTG  due to LOS  Skilled Therapeutic Interventions/Progress Updates:  Ambulation/gait training;DME/adaptive equipment instruction;UE/LE Strength taining/ROM;Wheelchair propulsion/positioning;Balance/vestibular training;Discharge planning;Pain management;Therapeutic Activities;Disease management/prevention;Functional mobility training;Patient/family education;Therapeutic Exercise   Therapy Documentation Precautions:  Precautions Precautions: Knee Precaution Comments: Bledsoe locked 15 degree flexion Other Brace: L knee hinged brace locked in 5-10' flexion (ortho does not want knee extended fully) Restrictions Weight Bearing Restrictions: No LLE Weight Bearing: Non weight bearing Other Position/Activity Restrictions: LLE  Therapy/Group: Individual Therapy AAlfonse AlpersPT, DPT  03/07/2020, 7:21 AM

## 2020-03-07 NOTE — Progress Notes (Signed)
Physical Therapy Session Note  Patient Details  Name: [name] MRN: [id number] Date of Birth: 1955-12-20  Today's Date: 03/07/2020 PT Individual Time: 1000-1054 and 1300-1355 PT Individual Time Calculation (min): 54 min and 55 min  Short Term Goals: Week 1:  PT Short Term Goal 1 (Week 1): supine to/from sit w/supervision PT Short Term Goal 1 - Progress (Week 1): Met PT Short Term Goal 2 (Week 1): sliding board transfers w/level surfaces w/min assist of 1 PT Short Term Goal 2 - Progress (Week 1): Progressing toward goal PT Short Term Goal 3 (Week 1): sit to stand in parallel bars or w/bari walker w/mod assist NWBing LLE PT Short Term Goal 3 - Progress (Week 1): Met PT Short Term Goal 4 (Week 1): pt will maintain NWBing in standing w/LRAD x >1 min PT Short Term Goal 4 - Progress (Week 1): Met Week 2:  PT Short Term Goal 1 (Week 2): STG=LTG due to LOS  Skilled Therapeutic Interventions/Progress Updates:  Treatment Session 1: 1000-1054 54 min Received pt sitting in WC pt agreeable to therapy, and reported pain 6/10 in LLE (premedicated). Repositioning, rest breaks, and distraction done to reduce pain levels. Session with emphasis on functional mobility/transfers, generalized strengthening, dynamic standing balance/coordination, and improved activity tolerance. Pt requested therapist assist with repositioning brief. Pt transferred sit<>stand with RW and mod A+2 and required total A to reposition brief x 2 trials during session. Pt performed WC mobility 1102f x 2 trials using BUE and supervision with 1-3 rest breaks to/from dayroom. Pt transferred sit<>stand with RW and mod A of 1 and transferred stand<>pivot with RW and min A+2 for safety. Pt required cues for turning technique, pivoting on RLE, and RW management. Pt transferred sit<>stand with RW and min A x 3 trials and performed the following exercises standing with BUE support and CGA for balance: -L hip flexion 2x10 -L hip abduction  2x10 -L hip extension 1x5 -seated overhead chest press with 4lb dowel 2x10 -seated horizontal chest press with 4lb dowel 2x10 Pt reported 9/10 fatigue with exercises. Pt required multiple extended rest/water breaks throughout session due to fatigue and poor activity tolerance and required mod cues for pursed lip breathing to avoid Valsalva throughout session. Stand<>pivot mat<>WC with RW and min A +2 for safety. Concluded session with pt sitting in WC, all needs within reach, and husband present at bedside.   Treatment Session 2: [id number] min Received pt sitting in WC with husband present at bedside, pt agreeable to therapy, and reported pain 6/10 in LLE (premedicated). Repositioning, rest breaks, and distraction done to reduce pain levels. Session with emphasis on functional mobility/transfers, generalized strengthening, dynamic standing balance/coordination, and improved activity tolerance. Therapist inspected RW that pt's husband brought in however walker too narrow and educated pt/husband on need for bariatric RW. Pt's husband donned R non-skid total A and [facility] and [facility] sit<>stand with RW and mod A of 1 but then returned to sitting due to instability. Stood again and transferred stand<>pivot with RW and mod A of 1.  Pt required cues for pivoting technique and RW safety as pt remains anxious with stand<>pivot transfers. Pt transferred sit<>stand with RW and mod A of 1 x 2 trials and performed the following standing balance activities: -alternating lifting hands from RW 2x6 reps bilaterally. Pt able to lift LUE from RW however still has more difficulty lifting LUE >RUE.  Worked on dynamic sitting balance and trunk control batting ball with  2lb dowel x20. Pt required multiple rest breaks throughout session due to increased fatigue. Stand<>pivot mat<>WC with RW and mod A and transported back to room in [facility] total A. Concluded session with pt sitting in WC and  all needs within reach.   Therapy Documentation Precautions:  Precautions Precautions: Knee Precaution Comments: Bledsoe locked 15 degree flexion Other Brace: L knee hinged brace locked in 5-10' flexion (ortho does not want knee extended fully) Restrictions Weight Bearing Restrictions: No LLE Weight Bearing: Non weight bearing Other Position/Activity Restrictions: LLE   Therapy/Group: Individual Therapy Alfonse Alpers PT, DPT   03/07/2020, 7:22 AM

## 2020-03-07 NOTE — Progress Notes (Signed)
[place] PHYSICAL MEDICINE & REHABILITATION PROGRESS NOTE   Subjective/Complaints: Patient seen laying in bed this morning.  She states she did not sleep well overnight because someone increased temperature in her room and she began to perspire and then start itching.  She does note some effectiveness with anxiolytic.  She was seen by Ortho, notes reviewed-x-ray next week.  ROS: Denies CP, SOB, N/V/D  Objective:   No results found. No results for input(s): WBC, HGB, HCT, PLT in the last 72 hours. No results for input(s): NA, K, CL, CO2, GLUCOSE, BUN, CREATININE, CALCIUM in the last 72 hours.  Intake/Output Summary (Last 24 hours) at 03/07/2020 1236 Last data filed at 03/07/2020 0700 Gross per 24 hour  Intake 440 ml  Output 1150 ml  Net -710 ml        Physical Exam: Vital Signs Blood pressure 123/66, pulse 65, temperature 98.1 F (36.7 C), resp. rate 18, height 5\' 8"  (1.727 m), weight (!) 143.2 kg, SpO2 95 %. Constitutional: No distress . Vital signs reviewed. HENT: Normocephalic.  Atraumatic. Eyes: EOMI. No discharge. Cardiovascular: No JVD.  RRR. Respiratory: Normal effort.  No stridor.  Bilateral clear to auscultation. GI: Non-distended.  BS +. Skin: Warm and dry.  Left knee with incision CDI Psych: Anxious. Musc: Left knee with edema and tenderness. Neuro:  Alert Motor: Bilateral upper extremities: 5/5 proximal distal Right lower extremity: 4+/5 proximal distal Left lower extremity: Hip flexion 2+/5 (some pain inhibition), knee brace, ankle dorsiflexion 4+/5  Sensation diminished light touch now over dorsal foot, unchanged  Assessment/Plan: 1. Functional deficits which require 3+ hours per day of interdisciplinary therapy in a comprehensive inpatient rehab setting.  Physiatrist is providing close team supervision and 24 hour management of active medical problems listed below.  Physiatrist and rehab team continue to assess barriers to discharge/monitor patient  progress toward functional and medical goals  Care Tool:  Bathing    Body parts bathed by patient: Right arm, Left arm, Chest, Face   Body parts bathed by helper: Front perineal area, Buttocks     Bathing assist Assist Level: Minimal Assistance - Patient > 75%     Upper Body Dressing/Undressing Upper body dressing   What is the patient wearing?: Pull over shirt    Upper body assist Assist Level: Set up assist    Lower Body Dressing/Undressing Lower body dressing    Lower body dressing activity did not occur:  (Pt had just completed hygiene with NT prior to OT arrival.) What is the patient wearing?: Underwear/pull up, Pants     Lower body assist Assist for lower body dressing: Maximal Assistance - Patient 25 - 49%     Toileting Toileting    Toileting assist Assist for toileting: Maximal Assistance - Patient 25 - 49%     Transfers Chair/bed transfer  Transfers assist     Chair/bed transfer assist level: 2 Helpers     Locomotion Ambulation   Ambulation assist   Ambulation activity did not occur: Safety/medical concerns          Walk 10 feet activity   Assist  Walk 10 feet activity did not occur: Safety/medical concerns        Walk 50 feet activity   Assist Walk 50 feet with 2 turns activity did not occur: Safety/medical concerns         Walk 150 feet activity   Assist Walk 150 feet activity did not occur: Safety/medical concerns         Walk  10 feet on uneven surface  activity   Assist Walk 10 feet on uneven surfaces activity did not occur: Safety/medical concerns         Wheelchair     Assist Will patient use wheelchair at discharge?: Yes Type of Wheelchair: Manual Wheelchair activity did not occur: Safety/medical concerns  Wheelchair assist level: Supervision/Verbal cueing Max wheelchair distance: 170ft    Wheelchair 50 feet with 2 turns activity    Assist    Wheelchair 50 feet with 2 turns activity did  not occur: Safety/medical concerns   Assist Level: Supervision/Verbal cueing   Wheelchair 150 feet activity     Assist  Wheelchair 150 feet activity did not occur: Safety/medical concerns       Blood pressure 123/66, pulse 65, temperature 98.1 F (36.7 C), resp. rate 18, height 5\' 8"  (1.727 m), weight (!) 143.2 kg, SpO2 95 %.  Medical Problem List and Plan: 1.  Decreased functional mobility secondary to left tibial plateau fracture.  Status post ORIF 02/25/2020.  Nonweightbearing left lower extremity with hinged knee brace locked in 10 degree flexion.  Ortho does not want knee extended fully  Continue CIR  Knee x-ray showing postoperative changes, plan for repeat films next week 2.  Antithrombotics: -DVT/anticoagulation: Lovenox.    Vascular studies reviewed, negative for DVT             -antiplatelet therapy: N/A 3. Pain Management:    Oxycodone and Robaxin as needed  Increased gabapentin to home dose 900 mg BID   Lidoderm patch added on 11/16  Relatively controlled with meds on 11/18             Monitor with increased exertion 4. Mood: Provide emotional support   Klonopin 0.25 twice daily as needed started on 11/17, with improvement             -antipsychotic agents: N/A 5. Neuropsych: This patient is capable of making decisions on her own behalf. 6. Skin/Wound Care: Routine skin checks 7. Fluids/Electrolytes/Nutrition: Routine in and outs.   8.  Acute blood loss anemia.               Hb 11.7 on 11/15  Cont to monitor  9.  Hypertension.  Norvasc 5 mg daily.   Relatively controlled on 11/18             Monitor with increased mobility. 10.  History of alcohol tobacco use.  Provide counseling 11.  Morbid obesity.  BMI 48.00 Encourage weight loss.  Dietary follow-up 12. Pruritis  Benadryl 25 mg q6 hours prn, added increased dose at bedtime nightly as needed  Included hygiene under straps for braces  Anxiety component as well 13. Hyponatremia  Na+ 134 on 11/15  Cont to  monitor 14. Hypoalbuminemia  Supplement initiated on 11/15 15.  Drug-induced constipation  Bowel meds increased on 11/16, DC'd on 11/17  Improving 16.  Urinary urgency-baseline  PVRs relatively unremarkable  Trial Enablex 17.  Sleep disturbance  See #4  Home melatonin restarted on 11/8  LOS: 6 days A FACE TO FACE EVALUATION WAS PERFORMED  Quron Ruddy Lorie Phenix 03/07/2020, 12:36 PM

## 2020-03-07 NOTE — Progress Notes (Signed)
Occupational Therapy Session Note  Patient Details  Name: [name] MRN: [id number] Date of Birth: 03-Mar-1956  Today's Date: 03/07/2020 OT Individual Time: 5427-0623   &   1445-1540 OT Individual Time Calculation (min): 45 min & 55 min   Short Term Goals: Week 1:  OT Short Term Goal 1 (Week 1): With use of AE, patient will complete LB dressing with min A OT Short Term Goal 2 (Week 1): Patient will complete toilet transfer with min A OT Short Term Goal 3 (Week 1): Patient will complete toileting task with min A  Skilled Therapeutic Interventions/Progress Updates:    AM session:   Patient in bed, she noted ongoing pain in left leg that is under control at this moment.  Max A to wash and apply ace wrap to left LE.  Supine to sitting with CS.  She completes UB bathing and dressing with set up.  Sit to stand from bed with CGA (+2), she was unable to pivot at this time - did bed/commode swap while she was standing.  She was continent of urine and bowel but brief was wet due to waiting for staff/therapy time.  LB dressing completed with max A for pull up/ pants over brace, max A for hygiene and clothing management.  Chair swap commode to w/c) in stance with CGA to maintain standing position with RW.  She was left seated at sink to complete oral care and grooming tasks, call bell and tray table accessible when she pushes herself away from the sink.     PM session:   Patient seated in w/c, ready for afternoon session.  Leg pain under control at this time.  Sit to stand from w/c CGA.  W/c and commode swap by second person due to fatigue.  She is able to pull pants down with mod A, dependent for hygiene after continent bowel and bladder, max A for pants up.  SPT with RW commode to bed with CGA of 2 (needed to move bed to her about 1 foot due to difficulty moving backward)  Sit to supine min A with increased time and positioning features of bed.  She is able to doff shirt and donn hospital gown with set up,  mod a to remove shorts, max A to remove ace wraps.  She remained in bed at close of session, ice packs for knee area, bed alarm set and call bell in hand.      Therapy Documentation Precautions:  Precautions Precautions: Knee Precaution Comments: Bledsoe locked 15 degree flexion Other Brace: L knee hinged brace locked in 5-10' flexion (ortho does not want knee extended fully) Restrictions Weight Bearing Restrictions: No LLE Weight Bearing: Non weight bearing Other Position/Activity Restrictions: LLE  Therapy/Group: Individual Therapy  Carlos Levering 03/07/2020, 7:33 AM

## 2020-03-08 ENCOUNTER — Inpatient Hospital Stay (HOSPITAL_COMMUNITY): Payer: BC Managed Care – PPO | Admitting: Occupational Therapy

## 2020-03-08 ENCOUNTER — Inpatient Hospital Stay (HOSPITAL_COMMUNITY): Payer: BC Managed Care – PPO

## 2020-03-08 DIAGNOSIS — G479 Sleep disorder, unspecified: Secondary | ICD-10-CM

## 2020-03-08 DIAGNOSIS — R350 Frequency of micturition: Secondary | ICD-10-CM

## 2020-03-08 LAB — CREATININE, SERUM
Creatinine, Ser: 0.57 mg/dL (ref 0.44–1.00)
GFR, Estimated: >60 mL/min (ref >60)

## 2020-03-08 MED ORDER — DARIFENACIN HYDROBROMIDE ER 7.5 MG PO TB24
7.5000 mg | ORAL_TABLET | Freq: Every day | ORAL | Status: DC
  Administered 2020-03-09 – 2020-03-12 (×4): 7.5 mg via ORAL
  Filled 2020-03-08 (×4): qty 1

## 2020-03-08 MED ORDER — CLONAZEPAM 0.25 MG PO TBDP
0.2500 mg | ORAL_TABLET | Freq: Every evening | ORAL | Status: DC | PRN
  Administered 2020-03-08 – 2020-03-16 (×6): 0.25 mg via ORAL
  Filled 2020-03-08 (×7): qty 1

## 2020-03-08 MED ORDER — CLONAZEPAM 0.25 MG PO TBDP: 0.2500 mg | ORAL_TABLET | ORAL | Status: DC | PRN

## 2020-03-08 MED ORDER — CLONAZEPAM 0.25 MG PO TBDP
0.2500 mg | ORAL_TABLET | Freq: Every day | ORAL | Status: DC
  Administered 2020-03-08 – 2020-03-15 (×8): 0.25 mg via ORAL
  Filled 2020-03-08 (×8): qty 1

## 2020-03-08 NOTE — Progress Notes (Signed)
[place] PHYSICAL MEDICINE & REHABILITATION PROGRESS NOTE   Subjective/Complaints: Patient seen laying in bed this AM husband at bedside.  She states she slept fairly overnight due to perspiration around her brace and then associated anxiety.  Discussed medications with patient including sleep aid, anxiolytics.  Discussed potential solutions for brace associated heat and perspiration with nursing.  Patient with questions regarding creatinine as well, encourage patient to avoid "googling "everything.  Has been in agreement.  ROS: Denies CP, SOB, N/V/D  Objective:   No results found. No results for input(s): WBC, HGB, HCT, PLT in the last 72 hours. Recent Labs    03/08/20 0445  CREATININE 0.57    Intake/Output Summary (Last 24 hours) at 03/08/2020 1120 Last data filed at 03/08/2020 0840 Gross per 24 hour  Intake 440 ml  Output 1065 ml  Net -625 ml        Physical Exam: Vital Signs Blood pressure (!) 115/57, pulse 71, temperature 97.6 F (36.4 C), temperature source Oral, resp. rate 20, height 5\' 8"  (1.727 m), weight (!) 143.2 kg, SpO2 100 %. Constitutional: No distress . Vital signs reviewed. HENT: Normocephalic.  Atraumatic. Eyes: EOMI. No discharge. Cardiovascular: No JVD.  RRR. Respiratory: Normal effort.  No stridor.  Bilateral clear to auscultation. GI: Non-distended.  BS +. Skin: Warm and dry.  Left knee with incision CDI. Psych: Anxious. Musc: Left knee with edema and tenderness. Neuro:  Alert Motor: Bilateral upper extremities: 5/5 proximal distal Right lower extremity: 4+/5 proximal distal Left lower extremity: Hip flexion 2+/5 (some pain inhibition), knee brace, ankle dorsiflexion 4+/5, stable Sensation diminished light touch over dorsal toes.  Assessment/Plan: 1. Functional deficits which require 3+ hours per day of interdisciplinary therapy in a comprehensive inpatient rehab setting.  Physiatrist is providing close team supervision and 24 hour  management of active medical problems listed below.  Physiatrist and rehab team continue to assess barriers to discharge/monitor patient progress toward functional and medical goals  Care Tool:  Bathing    Body parts bathed by patient: Right arm, Left arm, Chest, Face, Abdomen, Front perineal area, Right upper leg, Left upper leg, Right lower leg   Body parts bathed by helper: Buttocks, Left lower leg     Bathing assist Assist Level: Minimal Assistance - Patient > 75%     Upper Body Dressing/Undressing Upper body dressing   What is the patient wearing?: Pull over shirt    Upper body assist Assist Level: Set up assist    Lower Body Dressing/Undressing Lower body dressing    Lower body dressing activity did not occur:  (Pt had just completed hygiene with NT prior to OT arrival.) What is the patient wearing?: Underwear/pull up     Lower body assist Assist for lower body dressing: Minimal Assistance - Patient > 75%     Toileting Toileting    Toileting assist Assist for toileting: Maximal Assistance - Patient 25 - 49%     Transfers Chair/bed transfer  Transfers assist     Chair/bed transfer assist level: Minimal Assistance - Patient > 75%     Locomotion Ambulation   Ambulation assist   Ambulation activity did not occur: Safety/medical concerns          Walk 10 feet activity   Assist  Walk 10 feet activity did not occur: Safety/medical concerns        Walk 50 feet activity   Assist Walk 50 feet with 2 turns activity did not occur: Safety/medical concerns  Walk 150 feet activity   Assist Walk 150 feet activity did not occur: Safety/medical concerns         Walk 10 feet on uneven surface  activity   Assist Walk 10 feet on uneven surfaces activity did not occur: Safety/medical concerns         Wheelchair     Assist Will patient use wheelchair at discharge?: Yes Type of Wheelchair: Manual Wheelchair activity did not  occur: Safety/medical concerns  Wheelchair assist level: Supervision/Verbal cueing Max wheelchair distance: 157ft    Wheelchair 50 feet with 2 turns activity    Assist    Wheelchair 50 feet with 2 turns activity did not occur: Safety/medical concerns   Assist Level: Supervision/Verbal cueing   Wheelchair 150 feet activity     Assist  Wheelchair 150 feet activity did not occur: Safety/medical concerns       Blood pressure (!) 115/57, pulse 71, temperature 97.6 F (36.4 C), temperature source Oral, resp. rate 20, height 5\' 8"  (1.727 m), weight (!) 143.2 kg, SpO2 100 %.  Medical Problem List and Plan: 1.  Decreased functional mobility secondary to left tibial plateau fracture.  Status post ORIF 02/25/2020.  Nonweightbearing left lower extremity with hinged knee brace locked in 10 degree flexion.  Ortho does not want knee extended fully  Continue CIR  Knee x-ray showing postoperative changes, plan for repeat films next week per Ortho 2.  Antithrombotics: -DVT/anticoagulation: Lovenox.    Creatinine within normal limits on 11/19  Vascular studies reviewed, negative for DVT             -antiplatelet therapy: N/A 3. Pain Management:    Oxycodone and Robaxin as needed  Increased gabapentin to home dose 900 mg BID   Lidoderm patch added on 11/16  Relatively controlled with meds on 11/19             Monitor with increased exertion 4. Mood: Provide emotional support   Klonopin 0.25 changed to nightly plus additional dose as needed at night             -antipsychotic agents: N/A 5. Neuropsych: This patient is capable of making decisions on her own behalf. 6. Skin/Wound Care: Routine skin checks 7. Fluids/Electrolytes/Nutrition: Routine in and outs.   8.  Acute blood loss anemia.               Hb 11.7 on 11/15, labs ordered for Monday  Cont to monitor  9.  Hypertension.  Norvasc 5 mg daily.   Relatively controlled on 11/18             Monitor with increased mobility. 10.   History of alcohol tobacco use.  Provide counseling 11.  Morbid obesity.  BMI 48.00 Encourage weight loss.  Dietary follow-up 12. Pruritis  Benadryl 25 mg q6 hours prn, added increased dose at bedtime nightly as needed  Discussed with nursing patient using dressings under brace at night to absorb perspiration  Large anxiety component 13. Hyponatremia  Na+ 134 on 11/15, labs ordered for Monday  Cont to monitor 14. Hypoalbuminemia  Supplement initiated on 11/15 15.  Drug-induced constipation  Bowel meds increased on 11/16, DC'd on 11/17  Improved 16.  Urinary urgency-baseline  PVRs relatively unremarkable  Trial Enablex started on 11/18, changed to nightly 17.  Sleep disturbance  See #4  Home melatonin restarted on 11/8  See #4  LOS: 7 days A FACE TO FACE EVALUATION WAS PERFORMED  [name] Lorie Phenix 03/08/2020, 11:20 AM

## 2020-03-08 NOTE — Progress Notes (Signed)
Occupational Therapy Weekly Progress Note  Patient Details  Name: [name] MRN: [id number] Date of Birth: 1955-09-25  Beginning of progress report period: March 02, 2020 End of progress report period: March 08, 2020  Today's Date: 03/08/2020 OT Individual Time: 4166-0630 and 1405-1500 OT Individual Time Calculation (min): 74 min and 55 min   Patient has met 2 of 3 short term goals.  Pt is making steady progress towards goals.  Pt has progressed to completing sit > stand with CGA to min assist with heavy reliance on RW.  Pt is able to utilize reacher and leg lifter at bed level to complete LB dressing with min assist to wash LLE and min assist to don shoe.  Pt is able to complete stand pivot transfer bed <> w/c or BSC with Min assist to CGA with RW and increased time due to anxiety and fearfulness of onset of painin LLE.  Pt reports requiring assistance for hygiene post toileting PTA, and due to unable to access bathroom (with bidet) via RW pt will require setup and possible assistance for thoroughness of hygiene.   Patient continues to demonstrate the following deficits: muscle weakness, decreased cardiorespiratoy endurance and decreased standing balance, decreased postural control and decreased balance strategies and therefore will continue to benefit from skilled OT intervention to enhance overall performance with BADL and Reduce care partner burden.  Patient progressing toward long term goals..  Continue plan of care.  OT Short Term Goals Week 1:  OT Short Term Goal 1 (Week 1): With use of AE, patient will complete LB dressing with min A OT Short Term Goal 1 - Progress (Week 1): Met OT Short Term Goal 2 (Week 1): Patient will complete toilet transfer with min A OT Short Term Goal 2 - Progress (Week 1): Met OT Short Term Goal 3 (Week 1): Patient will complete toileting task with min A OT Short Term Goal 3 - Progress (Week 1): Progressing toward goal Week 2:  OT Short Term Goal 1  (Week 2): Patient will complete toileting task with min A OT Short Term Goal 2 (Week 2): STG = LTGs at supervision overall  Skilled Therapeutic Interventions/Progress Updates:    1) Treatment session with focus on self-care retraining, education on use of AE, and stand pivot transfers.  Pt received semi-reclined in bed with husband present for session.  Engaged in [place] bathing and dressing from bed level.  Pt reports urgent need to void bladder, therefore used urinal with therapist positioning.  Pt completed perineal hygiene post toileting with setup.  Therapist assisted with washing LLE and applying ace wrap for compression for edema management and then repositioning brace.  Pt utilized leg lifter (gait belt) to lift LLE at foot to assist with positioning during washing and applying ace wrap.  Pt donned incontinence pull up with use of reacher with increased time and bridging to pull up over Rt hip.  Pt required assistance to roll to Rt to pull brief over Lt hip.  Pt completed bed mobility with use of leg lifter and CGA.  Completed sit > stand from elevated EOB with heavy reliance on RW and CGA. Pt completed stand pivot transfer with increased time and pivoting on RLE to w/c.  Pt propelled w/c to sink to engage in oral care.  Pt remained upright at sink with husband present.  2) Treatment session with focus on safe transfer back to bed and toileting.  Pt received upright in w/c with reports of extreme pain in groin  and upper thigh due to positioning.  Pt demonstrating increased pain, rapid breathing, and reports dizziness - ?Panic attack.  RN alerted to situation and arrived to assist.  RN guiding pt through guided imagery and breathing technique due to not eligible for pain meds yet.  Pt attempted sit >stand x2 with RW with max assist +2.  However pain and anxiety limiting ability to stand.  Completed lateral scoot along slide board with min assist +3 due to increased anxiety and fearfulness of falling and  management of LLE.  Pt positioned back in bed, again directed through breathing technique as pt yelling out in pain and fear.  Pt reports need to urinate and refuses OOB mobility.  Placed urinal and provided running water to allow pt to void bladder, however unsuccessful due to still in heightened state.  Pt remained in semi-reclined position with urinal in place and RN plans to return later with meds and to attempt another voiding trial.  Therapy Documentation Precautions:  Precautions Precautions: Knee Precaution Comments: Bledsoe locked 15 degree flexion Other Brace: L knee hinged brace locked in 5-10' flexion (ortho does not want knee extended fully) Restrictions Weight Bearing Restrictions: No LLE Weight Bearing: Non weight bearing Other Position/Activity Restrictions: LLE Pain: Pain Assessment Pain Scale: 0-10 Pain Score: 7  Pain Type: Acute pain Pain Location: Knee Pain Orientation: Left Pain Descriptors / Indicators: Shooting Pain Frequency: Constant Pain Onset: On-going Patients Stated Pain Goal: 2 ADL:   Vision   Perception    Praxis   Exercises:   Other Treatments:     Therapy/Group: Individual Therapy  Simonne Come 03/08/2020, 10:26 AM

## 2020-03-08 NOTE — Progress Notes (Signed)
Physical Therapy Session Note  Patient Details  Name: [name] MRN: [id number] Date of Birth: 09/04/1955  Today's Date: 03/08/2020 PT Individual Time: 1100-1158 PT Individual Time Calculation (min): 58 min   Short Term Goals: Week 2:  PT Short Term Goal 1 (Week 2): STG=LTG due to LOS  Skilled Therapeutic Interventions/Progress Updates:    Patient received sitting up in wc, agreeable to PT. She reports 8-9/10 pain in L LE, premedicated. PT providing extended rest breaks and education on breathing techniques to assist with pain management. Patient voicing her frustration about moving rooms stating that her husband is upset that he will have to walk further. Patient requiring encouragement to redirect from this to therapeutic tasks. PT propelling patient in wc to therapy gym. She was able to complete sit <>stand in parallel bars with supervision and verbal cues to maintain NWB status to L LE. She was able to maintain static standing balance in parallel bars with intermittent U UE support. She was more successful and comfortable with maintaining balance with L UE on bars than with R UE on bars. PT attempting to get patient to complete heel raise on R LE with B UE support on bars in preparation for hopping/carryover to stand pivot transfers. Patient became very upset at this task and very emotional. Extended seated rest break with PT providing emotional support and therapeutic counseling on anxiety and therapeutic tasks to progress through therapy to ensure safe dc home. Patient responsive to this. She was able to transfer stand pivot x4 with RW and CGA/close supervision. Verbal cues needed for breathing throughout transfer. Patient returning to new room in wc, call light within reach, NT at bedside to help her settle into her new room.   Therapy Documentation Precautions:  Precautions Precautions: Knee Precaution Comments: Bledsoe locked 15 degree flexion Other Brace: L knee hinged brace locked in  5-10' flexion (ortho does not want knee extended fully) Restrictions Weight Bearing Restrictions: No LLE Weight Bearing: Non weight bearing Other Position/Activity Restrictions: LLE    Therapy/Group: Individual Therapy  Karoline Caldwell, PT, DPT, CBIS  03/08/2020, 7:37 AM

## 2020-03-09 NOTE — Progress Notes (Signed)
[facility] PHYSICAL MEDICINE & REHABILITATION PROGRESS NOTE   Subjective/Complaints: Patient complains of frequent urination and urgency but minimal occasional burning.  The patient feels she has had similar symptoms for years.  She feels like she can empty her bladder better in a space ship position Starting on Enablex 11/20  ROS: Denies CP, SOB, N/V/D  Objective:   No results found. No results for input(s): WBC, HGB, HCT, PLT in the last 72 hours. Recent Labs    03/08/20 0445  CREATININE 0.57    Intake/Output Summary (Last 24 hours) at 03/09/2020 1350 Last data filed at 03/09/2020 0815 Gross per 24 hour  Intake 520 ml  Output 2100 ml  Net -1580 ml        Physical Exam: Vital Signs Blood pressure (!) 120/54, pulse 79, temperature 97.9 F (36.6 C), temperature source Oral, resp. rate 20, height 5\' 8"  (1.727 m), weight (!) 143.2 kg, SpO2 99 %.  General: No acute distress Mood and affect are appropriate Heart: Regular rate and rhythm no rubs murmurs or extra sounds Lungs: Clear to auscultation, breathing unlabored, no rales or wheezes Abdomen: Positive bowel sounds, soft nontender to palpation, nondistended Extremities: No clubbing, cyanosis, or edema Skin: No evidence of breakdown, no evidence of rash   Psych: Anxious. Musc: Left knee with edema and tenderness. Neuro:  Alert Motor: Bilateral upper extremities: 5/5 proximal distal Right lower extremity: 4+/5 proximal distal Left lower extremity: Hip flexion 2+/5 (some pain inhibition), knee brace, ankle dorsiflexion 4+/5, stable Sensation diminished light touch over dorsal toes.  Assessment/Plan: 1. Functional deficits which require 3+ hours per day of interdisciplinary therapy in a comprehensive inpatient rehab setting.  Physiatrist is providing close team supervision and 24 hour management of active medical problems listed below.  Physiatrist and rehab team continue to assess barriers to discharge/monitor  patient progress toward functional and medical goals  Care Tool:  Bathing    Body parts bathed by patient: Right arm, Left arm, Chest, Face, Abdomen, Front perineal area, Right upper leg, Left upper leg, Right lower leg   Body parts bathed by helper: Buttocks, Left lower leg     Bathing assist Assist Level: Minimal Assistance - Patient > 75%     Upper Body Dressing/Undressing Upper body dressing   What is the patient wearing?: Pull over shirt    Upper body assist Assist Level: Set up assist    Lower Body Dressing/Undressing Lower body dressing    Lower body dressing activity did not occur:  (Pt had just completed hygiene with NT prior to OT arrival.) What is the patient wearing?: Underwear/pull up     Lower body assist Assist for lower body dressing: Minimal Assistance - Patient > 75%     Toileting Toileting    Toileting assist Assist for toileting: Maximal Assistance - Patient 25 - 49%     Transfers Chair/bed transfer  Transfers assist     Chair/bed transfer assist level: Contact Guard/Touching assist     Locomotion Ambulation   Ambulation assist   Ambulation activity did not occur: Safety/medical concerns          Walk 10 feet activity   Assist  Walk 10 feet activity did not occur: Safety/medical concerns        Walk 50 feet activity   Assist Walk 50 feet with 2 turns activity did not occur: Safety/medical concerns         Walk 150 feet activity   Assist Walk 150 feet activity did not occur:  Safety/medical concerns         Walk 10 feet on uneven surface  activity   Assist Walk 10 feet on uneven surfaces activity did not occur: Safety/medical concerns         Wheelchair     Assist Will patient use wheelchair at discharge?: Yes Type of Wheelchair: Manual Wheelchair activity did not occur: Safety/medical concerns  Wheelchair assist level: Supervision/Verbal cueing Max wheelchair distance: 139ft    Wheelchair 50  feet with 2 turns activity    Assist    Wheelchair 50 feet with 2 turns activity did not occur: Safety/medical concerns   Assist Level: Supervision/Verbal cueing   Wheelchair 150 feet activity     Assist  Wheelchair 150 feet activity did not occur: Safety/medical concerns       Blood pressure (!) 120/54, pulse 79, temperature 97.9 F (36.6 C), temperature source Oral, resp. rate 20, height 5\' 8"  (1.727 m), weight (!) 143.2 kg, SpO2 99 %.  Medical Problem List and Plan: 1.  Decreased functional mobility secondary to left tibial plateau fracture.  Status post ORIF 02/25/2020.  Nonweightbearing left lower extremity with hinged knee brace locked in 10 degree flexion.  Ortho does not want knee extended fully  Continue CIR  Knee x-ray showing postoperative changes, plan for repeat films next week per Ortho 2.  Antithrombotics: -DVT/anticoagulation: Lovenox.    Creatinine within normal limits on 11/19  Vascular studies reviewed, negative for DVT             -antiplatelet therapy: N/A 3. Pain Management:    Oxycodone and Robaxin as needed  Increased gabapentin to home dose 900 mg BID   Lidoderm patch added on 11/16  Relatively controlled with meds on 11/19             Monitor with increased exertion 4. Mood: Provide emotional support   Klonopin 0.25 changed to nightly plus additional dose as needed at night             -antipsychotic agents: N/A 5. Neuropsych: This patient is capable of making decisions on her own behalf. 6. Skin/Wound Care: Routine skin checks 7. Fluids/Electrolytes/Nutrition: Routine in and outs.   8.  Acute blood loss anemia.               Hb 11.7 on 11/15, labs ordered for Monday  Cont to monitor  9.  Hypertension.  Norvasc 5 mg daily.   Relatively controlled on 11/18 Vitals:   03/09/20 0410 03/09/20 0800  BP: 121/65 (!) 120/54  Pulse: 68 79  Resp: 20 20  Temp: 97.7 F (36.5 C) 97.9 F (36.6 C)  SpO2: 98% 99%  controlled 11/20  10.  History of  alcohol tobacco use.  Provide counseling 11.  Morbid obesity.  BMI 48.00 Encourage weight loss.  Dietary follow-up 12. Pruritis  Benadryl 25 mg q6 hours prn, added increased dose at bedtime nightly as needed  Discussed with nursing patient using dressings under brace at night to absorb perspiration  Large anxiety component 13. Hyponatremia  Na+ 134 on 11/15, labs ordered for Monday  Cont to monitor 14. Hypoalbuminemia  Supplement initiated on 11/15 15.  Drug-induced constipation  Bowel meds increased on 11/16, DC'd on 11/17  Improved 16.  Urinary urgency-baseline  PVRs relatively unremarkable   Enablex 7.5 qhs started on 11/20 17.  Sleep disturbance  See #4  Home melatonin restarted on 11/8  See #4  LOS: 8 days A FACE TO FACE EVALUATION WAS PERFORMED  Luanna Salk [place] 03/09/2020, 1:50 PM

## 2020-03-10 ENCOUNTER — Inpatient Hospital Stay (HOSPITAL_COMMUNITY): Payer: BC Managed Care – PPO

## 2020-03-10 NOTE — Progress Notes (Signed)
Occupational Therapy Session Note  Patient Details  Name: [name] MRN: [id number] Date of Birth: Feb 28, 1956  Today's Date: 03/10/2020 OT Individual Time: 0900-1000 OT Individual Time Calculation (min): 60 min    Short Term Goals: Week 2:  OT Short Term Goal 1 (Week 2): Patient will complete toileting task with min A OT Short Term Goal 2 (Week 2): STG = LTGs at supervision overall  Skilled Therapeutic Interventions/Progress Updates:    Pt resting in bed upon arrival and requesting to use BSC. Supine>sit EOB with supervision using "leg lifter." Sit<>stand with RW from elevated bed with CGA. Stand pivot transfer to [facility] at bedside with CGA. Pt required assistance for doffing incontinence brief. Pt dependent for hygiene. Sit<>stand from [facility] (lower surface) X 3 with min A. Pt required max A for pulling incontinence brief over hips. Stand pivot transfer to w/c with CGA. All transfers to pt's R. Pt assisted with repositioning Bledsoe brace. Pt completed UB bathing tasks and donning dress seated in w/c at sink. Pt requires more then a reasonable amount of time to complete tasks. Pt remained in w/c at sink completing grooming tasks.   Therapy Documentation Precautions:  Precautions Precautions: Knee Precaution Comments: Bledsoe locked 15 degree flexion Other Brace: L knee hinged brace locked in 5-10' flexion (ortho does not want knee extended fully) Restrictions Weight Bearing Restrictions: Yes LLE Weight Bearing: Non weight bearing Other Position/Activity Restrictions: LLE   Pain: Pt denied pain during therapy session  Therapy/Group: Individual Therapy  Leroy Libman 03/10/2020, 10:05 AM

## 2020-03-10 NOTE — Progress Notes (Signed)
Occupational Therapy Session Note  Patient Details  Name: Jadwiga Faidley MRN: [id number] Date of Birth: 08-Nov-1955  Today's Date: 03/10/2020 OT Individual Time: 1300-1400 OT Individual Time Calculation (min): 60 min    Short Term Goals: Week 2:  OT Short Term Goal 1 (Week 2): Patient will complete toileting task with min A OT Short Term Goal 2 (Week 2): STG = LTGs at supervision overall  Skilled Therapeutic Interventions/Progress Updates:    Pt resting in w/c upon arrival. OT intervention with focus on w/c mobility, activity tolerance/endurance, sit>stand, stand pivot transfer, and sit>supine in bed. Theraband applied to w/c rims to improve efficiency of pt's propulsion. Pt commented on improved efficiency. Pt propelled w/c in hallway, navigating into/out of ADL apartment, and turning with supervision. Pt propelled w/c from ADL apartment to nursing station with rest breaks X 2. Stand pivot transfer from w/c to bed with CGA. Sit>supine with CGA. Pt able to reposition self in bed. Pt remained in bed with all needs within reach and bed alarm activated.   Therapy Documentation Precautions:  Precautions Precautions: Knee Precaution Comments: Bledsoe locked 15 degree flexion Other Brace: L knee hinged brace locked in 5-10' flexion (ortho does not want knee extended fully) Restrictions Weight Bearing Restrictions: Yes LLE Weight Bearing: Non weight bearing Other Position/Activity Restrictions: LLE   Pain: Pain Assessment Pain Scale: 0-10 Pain Score: 4  Pain Type: Acute pain;Surgical pain Pain Location: Knee Pain Orientation: Left Pain Descriptors / Indicators: Aching Pain Frequency: Intermittent Patients Stated Pain Goal: 2 Pain Intervention(s): repositioned  Therapy/Group: Individual Therapy  Leroy Libman 03/10/2020, 2:35 PM

## 2020-03-10 NOTE — Progress Notes (Signed)
Physical Therapy Session Note  Patient Details  Name: [name] MRN: [id number] Date of Birth: [birth year]  Today's Date: 03/10/2020 PT Individual Time: 1045-1205 PT Individual Time Calculation (min): 80 min   Short Term Goals:  Week 2:  PT Short Term Goal 1 (Week 2): STG=LTG due to LOS  Skilled Therapeutic Interventions/Progress Updates:  Pt seated in w/c.  She rated pain L knee and calf 4/10; she is awaiting pain meds at 12:10.  Pt re-adjusted her position in w/c and knee pain increased.  PT educated pt about possibility of tylenol between doses of pain meds, particularly if PT is scheduled when pain meds are wearing off.  Ice pack applied to L knee.  PT educated pt about relaxation imagery with diaphragmatic breathing for anxiety/pain control.  wc propulsion on level tile x 60' x 5, throughout session, including turns.  VCs for techniques when turning and backing up, with good carry over.  PT shortened bil legrests to provided better support of feet; she is able to scoot/boost backwards in w/c with supervision, and initiates this PRN. L knee brace noted to have slid down on LE.   Sit> stand in parallel bars, supervision.  PT adjusted L knee brace with pt standing x 1 minute, with 2nd person guarding w/c.  Pt unable to continue standing due to L knee pain.  Seated exercises, using 3# weight on thigh for 1 x 10 R hip flexion, and 2 x 10 calf raises; 3# on ankle for 1 x 10 R knee long arc quad extensions, without weights for 2 x 10 R/L ankle pumps.  At end of session, pt seated in w/c iwht ice pack L knee, needs at hand and RN administering pain meds.       Therapy Documentation Precautions:  Precautions Precautions: Knee Precaution Comments: Bledsoe locked 15 degree flexion Other Brace: L knee hinged brace locked in 5-10' flexion (ortho does not want knee extended fully) Restrictions Weight Bearing Restrictions: Yes LLE Weight Bearing: Non weight bearing Other Position/Activity  Restrictions: LLE           Therapy/Group: Individual Therapy  Mikah Rottinghaus 03/10/2020, 12:17 PM

## 2020-03-10 NOTE — Plan of Care (Signed)
  Problem: RH BOWEL ELIMINATION Goal: RH STG MANAGE BOWEL WITH ASSISTANCE Description: STG Manage Bowel with min Assistance. Outcome: Progressing Goal: RH STG MANAGE BOWEL W/MEDICATION W/ASSISTANCE Description: STG Manage Bowel with Medication with min Assistance. Outcome: Progressing   Problem: RH BLADDER ELIMINATION Goal: RH STG MANAGE BLADDER WITH ASSISTANCE Description: STG Manage Bladder With min Assistance Outcome: Progressing Goal: RH STG MANAGE BLADDER WITH MEDICATION WITH ASSISTANCE Description: STG Manage Bladder With Medication With min Assistance. Outcome: Progressing Goal: RH STG MANAGE BLADDER WITH EQUIPMENT WITH ASSISTANCE Description: STG Manage Bladder With Equipment With min Assistance Outcome: Progressing

## 2020-03-11 ENCOUNTER — Inpatient Hospital Stay (HOSPITAL_COMMUNITY): Payer: BC Managed Care – PPO | Admitting: Occupational Therapy

## 2020-03-11 ENCOUNTER — Inpatient Hospital Stay (HOSPITAL_COMMUNITY): Payer: BC Managed Care – PPO

## 2020-03-11 LAB — CBC WITH DIFFERENTIAL/PLATELET
Abs Immature Granulocytes: 0.02 10*3/uL (ref 0.00–0.07)
Basophils Absolute: 0.1 10*3/uL (ref 0.0–0.1)
Basophils Relative: 1 %
Eosinophils Absolute: 0.2 10*3/uL (ref 0.0–0.5)
Eosinophils Relative: 3 %
HCT: 41.1 % (ref 36.0–46.0)
Hemoglobin: 13.3 g/dL (ref 12.0–15.0)
Immature Granulocytes: 0 %
Lymphocytes Relative: 16 %
Lymphs Abs: 1.2 10*3/uL (ref 0.7–4.0)
MCH: 30.5 pg (ref 26.0–34.0)
MCHC: 32.4 g/dL (ref 30.0–36.0)
MCV: 94.3 fL (ref 80.0–100.0)
Monocytes Absolute: 0.5 10*3/uL (ref 0.1–1.0)
Monocytes Relative: 6 %
Neutro Abs: 5.7 10*3/uL (ref 1.7–7.7)
Neutrophils Relative %: 74 %
Platelets: 443 10*3/uL — ABNORMAL HIGH (ref 150–400)
RBC: 4.36 MIL/uL (ref 3.87–5.11)
RDW: 13.4 % (ref 11.5–15.5)
WBC: 7.7 10*3/uL (ref 4.0–10.5)
nRBC: 0 % (ref 0.0–0.2)

## 2020-03-11 LAB — BASIC METABOLIC PANEL
Anion gap: 15 (ref 5–15)
BUN: 9 mg/dL (ref 8–23)
CO2: 22 mmol/L (ref 22–32)
Calcium: 9.4 mg/dL (ref 8.9–10.3)
Chloride: 95 mmol/L — ABNORMAL LOW (ref 98–111)
Creatinine, Ser: 0.57 mg/dL (ref 0.44–1.00)
GFR, Estimated: >60 mL/min (ref >60)
Glucose, Bld: 104 mg/dL — ABNORMAL HIGH (ref 70–99)
Potassium: 4 mmol/L (ref 3.5–5.1)
Sodium: 132 mmol/L — ABNORMAL LOW (ref 135–145)

## 2020-03-11 NOTE — Progress Notes (Addendum)
Patient ID: [name], female   DOB: 10-06-1955, 64 y.o.   MRN: [id number]  Attempting to find Home health to see pt, but al have declined her [employer]. [place], [facility], [facility], [facility], [facility], [facility], [facility]. Trying [facility] awaiting return.  1:21 PM [facility] has accepted pt for PT follow up.

## 2020-03-11 NOTE — Plan of Care (Signed)
  Problem: RH Ambulation Goal: LTG Patient will ambulate in home environment (PT) Description: LTG: Patient will ambulate in home environment, # of feet with assistance (PT). Outcome: Not Met (add Reason) Flowsheets (Taken 03/11/2020 1220) LTG: Pt will ambulate in home environ  assist needed:: (D/C) -- Note: D/C   Problem: Sit to Stand Goal: LTG:  Patient will perform sit to stand with assistance level (PT) Description: LTG:  Patient will perform sit to stand with assistance level (PT) Flowsheets (Taken 03/11/2020 1220) LTG: PT will perform sit to stand in preparation for functional mobility with assistance level: (upgraded due to improved strength, coordination, and endurance) Supervision/Verbal cueing Note: upgraded due to improved strength, coordination, and endurance   Problem: RH Bed Mobility Goal: LTG Patient will perform bed mobility with assist (PT) Description: LTG: Patient will perform bed mobility with assistance, with/without cues (PT). Flowsheets (Taken 03/11/2020 1220) LTG: Pt will perform bed mobility with assistance level of: (downgraded due to LLE Bledsoe brace locked in extension) Independent with assistive device  Note: downgraded due to LLE Bledsoe brace locked in extension   Problem: RH Bed to Chair Transfers Goal: LTG Patient will perform bed/chair transfers w/assist (PT) Description: LTG: Patient will perform bed to chair transfers with assistance (PT). Flowsheets (Taken 03/11/2020 1220) LTG: Pt will perform Bed to Chair Transfers with assistance level: (downgraded due to decreased standing balance, body habitus, and difficulty maintaining LLE NWB precautions) Supervision/Verbal cueing Note: downgraded due to decreased standing balance, body habitus, and difficulty maintaining LLE NWB precautions

## 2020-03-11 NOTE — Progress Notes (Signed)
[name] PHYSICAL MEDICINE & REHABILITATION PROGRESS NOTE   Subjective/Complaints: No initial complaints this morning She asks whom FMLA paperwork should be sent to. Has had a hard time getting in touch with [facility] to have pain, medications allow her to tolerate therapy.   ROS: Denies CP, SOB, N/V/D  Objective:   No results found. No results for input(s): WBC, HGB, HCT, PLT in the last 72 hours. No results for input(s): NA, K, CL, CO2, GLUCOSE, BUN, CREATININE, CALCIUM in the last 72 hours.  Intake/Output Summary (Last 24 hours) at 03/11/2020 1039 Last data filed at 03/11/2020 9935 Gross per 24 hour  Intake 1320 ml  Output 1750 ml  Net -430 ml    Physical Exam: Vital Signs Blood pressure (!) 141/56, pulse 67, temperature 98.2 F (36.8 C), resp. rate 20, height 5\' 8"  (1.727 m), weight (!) 143.2 kg, SpO2 98 %. Gen: no distress, normal appearing HEENT: oral mucosa pink and moist, NCAT Cardio: Reg rate Chest: normal effort, normal rate of breathing Abd: soft, non-distended Ext: no edema Skin: intact Psych: Anxious. Musc: Left knee with edema and tenderness. Neuro:  Alert Motor: Bilateral upper extremities: 5/5 proximal distal Right lower extremity: 4+/5 proximal distal Left lower extremity: Hip flexion 2+/5 (some pain inhibition), knee brace, ankle dorsiflexion 4+/5, stable Sensation diminished light touch over dorsal toes.  Assessment/Plan: 1. Functional deficits which require 3+ hours per day of interdisciplinary therapy in a comprehensive inpatient rehab setting.  Physiatrist is providing close team supervision and 24 hour management of active medical problems listed below.  Physiatrist and rehab team continue to assess barriers to discharge/monitor patient progress toward functional and medical goals  Care Tool:  Bathing    Body parts bathed by patient: Right arm, Left arm, Chest, Face, Abdomen, Front perineal area, Right upper leg, Left  upper leg   Body parts bathed by helper: Buttocks, Left lower leg     Bathing assist Assist Level: Minimal Assistance - Patient > 75%     Upper Body Dressing/Undressing Upper body dressing   What is the patient wearing?: Dress    Upper body assist Assist Level: Set up assist    Lower Body Dressing/Undressing Lower body dressing    Lower body dressing activity did not occur:  (Pt had just completed hygiene with NT prior to OT arrival.) What is the patient wearing?: Underwear/pull up     Lower body assist Assist for lower body dressing: Minimal Assistance - Patient > 75%     Toileting Toileting    Toileting assist Assist for toileting: Maximal Assistance - Patient 25 - 49%     Transfers Chair/bed transfer  Transfers assist     Chair/bed transfer assist level: Contact Guard/Touching assist     Locomotion Ambulation   Ambulation assist   Ambulation activity did not occur: Safety/medical concerns          Walk 10 feet activity   Assist  Walk 10 feet activity did not occur: Safety/medical concerns        Walk 50 feet activity   Assist Walk 50 feet with 2 turns activity did not occur: Safety/medical concerns         Walk 150 feet activity   Assist Walk 150 feet activity did not occur: Safety/medical concerns         Walk 10 feet on uneven surface  activity   Assist Walk 10 feet on uneven surfaces activity did not occur: Safety/medical concerns  Wheelchair     Assist Will patient use wheelchair at discharge?: Yes Type of Wheelchair: Manual Wheelchair activity did not occur: Safety/medical concerns  Wheelchair assist level: Supervision/Verbal cueing Max wheelchair distance: 60    Wheelchair 50 feet with 2 turns activity    Assist    Wheelchair 50 feet with 2 turns activity did not occur: Safety/medical concerns   Assist Level: Supervision/Verbal cueing   Wheelchair 150 feet activity     Assist   Wheelchair 150 feet activity did not occur: Safety/medical concerns       Blood pressure (!) 141/56, pulse 67, temperature 98.2 F (36.8 C), resp. rate 20, height 5\' 8"  (1.727 m), weight (!) 143.2 kg, SpO2 98 %.  Medical Problem List and Plan: 1.  Decreased functional mobility secondary to left tibial plateau fracture.  Status post ORIF 02/25/2020.  Nonweightbearing left lower extremity with hinged knee brace locked in 10 degree flexion.  Ortho does not want knee extended fully  Continue CIR  Knee x-ray showing postoperative changes, plan for repeat films next week per Ortho 2.  Antithrombotics: -DVT/anticoagulation: Lovenox.    Creatinine within normal limits on 11/19  Vascular studies reviewed, negative for DVT             -antiplatelet therapy: N/A 3. Pain Management:    Oxycodone and Robaxin as needed  Increased gabapentin to home dose 900 mg BID   Lidoderm patch added on 11/16  Continues to have pain but is able to tolerate therapy with medications 11/22             Monitor with increased exertion 4. Mood: Provide emotional support  Klonopin 0.25 changed to nightly plus additional dose as needed at night             -antipsychotic agents: N/A 5. Neuropsych: This patient is capable of making decisions on her own behalf. 6. Skin/Wound Care: Routine skin checks 7. Fluids/Electrolytes/Nutrition: Routine in and outs.   8.  Acute blood loss anemia.               Hb 11.7 on 11/15, labs ordered for Monday  Cont to monitor  9.  Hypertension.  Norvasc 5 mg daily.   Labile 11/22 Vitals:   03/10/20 1925 03/11/20 0340  BP: 129/60 (!) 141/56  Pulse: 74 67  Resp: 20 20  Temp: 98.2 F (36.8 C) 98.2 F (36.8 C)  SpO2: 98% 98%    10.  History of alcohol tobacco use.  Provide counseling 11.  Morbid obesity.  BMI 48.00 Encourage weight loss.  Dietary follow-up 12. Pruritis  Benadryl 25 mg q6 hours prn, added increased dose at bedtime nightly as needed  Discussed with nursing patient  using dressings under brace at night to absorb perspiration  Large anxiety component 13. Hyponatremia  Na+ 134 on 11/15, labs ordered for Monday  Cont to monitor 14. Hypoalbuminemia  Supplement initiated on 11/15 15.  Drug-induced constipation  Bowel meds increased on 11/16, DC'd on 11/17  11/22: patient states she is moving bowels regularly 16.  Urinary urgency-baseline  PVRs relatively unremarkable   Enablex 7.5 qhs started on 11/20 17.  Sleep disturbance  See #4  Home melatonin restarted on 11/8  See #4  LOS: 10 days A FACE TO FACE EVALUATION WAS PERFORMED  Clide Deutscher Sarahanne Novakowski 03/11/2020, 10:39 AM

## 2020-03-11 NOTE — Progress Notes (Signed)
Occupational Therapy Session Note  Patient Details  Name: [name] MRN: [id number] Date of Birth: [birth year]  Today's Date: 03/11/2020 OT Individual Time: 0751-0900   &   1345-1450 OT Individual Time Calculation (min): 69 min    &   65 min   Short Term Goals: Week 2:  OT Short Term Goal 1 (Week 2): Patient will complete toileting task with min A OT Short Term Goal 2 (Week 2): STG = LTGs at supervision overall  Skilled Therapeutic Interventions/Progress Updates:    AM session:   Patient in bed, alert and ready for therapy - she notes that she just received her pain meds.  Ace wrap applied to left LE and brace adjusted.  She is able to move from supine to sitting with set up.  SPT bed to commode to w/c with CGA and set up using RW.  adl tasks performed both seated on commode and w/c level - she utilized reacher to donn pull up, set up for [facility] dress - she requires assistance for washing buttocks and clothing management in stance.  Utilized shampoo tray to wash hair and discussed options for home setting - provided and reviewed use of shampoo cap.  Discussed DME set up and strategies for safe mobility.  She remained seated in w/c at close of session with call bell and tray table in reach.     PM session:   Patient seated in w/c, she notes fatigue and increased pain in leg at this time.  SPT w/c to/from  Commode with RW CGA - requires moving commode out of the way and pulling w/c closer while in stance due to difficulty with pivot this afternoon.  Max A for clothing management and hygiene after continent bowel and bladder.  Completed task of arranging flowers that arrived from her friend this afternoon.  Completed UB ergometer 2 x 5 minutes.  SPT w/c to bed at close of session with min A.  CGA for bed mobility.  She remained in bed at close of session - ace wraps removed, nursing to get ice packs, bed alarm set and call bell in reach.      Therapy Documentation Precautions:   Precautions Precautions: Knee Precaution Comments: Bledsoe locked 15 degree flexion Other Brace: L knee hinged brace locked in 5-10' flexion (ortho does not want knee extended fully) Restrictions Weight Bearing Restrictions: Yes LLE Weight Bearing: Non weight bearing Other Position/Activity Restrictions: LLE   Therapy/Group: Individual Therapy  Carlos Levering 03/11/2020, 7:31 AM

## 2020-03-11 NOTE — Progress Notes (Signed)
Physical Therapy Session Note  Patient Details  Name: [name] MRN: [id number] Date of Birth: April 14, 1956  Today's Date: 03/11/2020 PT Individual Time: 1000-1113 PT Individual Time Calculation (min): 73 min   Short Term Goals: Week 1:  PT Short Term Goal 1 (Week 1): supine to/from sit w/supervision PT Short Term Goal 1 - Progress (Week 1): Met PT Short Term Goal 2 (Week 1): sliding board transfers w/level surfaces w/min assist of 1 PT Short Term Goal 2 - Progress (Week 1): Progressing toward goal PT Short Term Goal 3 (Week 1): sit to stand in parallel bars or w/bari walker w/mod assist NWBing LLE PT Short Term Goal 3 - Progress (Week 1): Met PT Short Term Goal 4 (Week 1): pt will maintain NWBing in standing w/LRAD x >1 min PT Short Term Goal 4 - Progress (Week 1): Met Week 2:  PT Short Term Goal 1 (Week 2): STG=LTG due to LOS  Skilled Therapeutic Interventions/Progress Updates:   Received pt sitting in WC, pt agreeable to therapy, and reported pain 3/10 in LLE and 8/10 pain in L pectoralis (premedicated). Repositioning, rest breaks, and distraction done to reduce pain levels. Pt continues to remain verbose and anxious and requires redirection and increased time with mobility. Session with emphasis on functional mobility/transfers, generalized strengthening, dynamic standing balance/coordination, and improved activity tolerance. Pt requested therapist refill water pitcher prior to starting session. Therapist provided pt with fresh ice water and pt performed WC mobility 149f using BUE and supervision with 3 rest breaks and transported remainder of way [facility] Pt transferred WC<>mat stand<>pivot with RW and CGA with tennis shoe on R foot. Pt able to pivot backwards today when approaching the mat but continues to require cues for RW management. Pt with questions regarding how to handle discomfort from L pectoralis strain. Therapist educated pt on heat vs ice and encouraged pt  to move L arm and to massage area; pt verbalized understanding. Pt transferred sit<>stand with RW and CGA x 3 trials and worked on dynamic standing balance tossing horseshoes with RUE and CGA for balance. Pt required multiple extended rest/water breaks throughout session. Pt performed the following exercises sitting on mat with supervision and verbal cues for technique: -R hip flexion 2x10 with 3lb ankle weight -R knee extension 2x10 with 3lb ankle weight  -hip adduction ball squeezes 2x10 with 3 second isometric hold Pt transferred mat<>WC stand<>pivot with RW and CGA and [facility]. [facility]. Therapist provided pt with hot pack for L pectoralis and fresh drink.   Therapy Documentation Precautions:  Precautions Precautions: Knee Precaution Comments: Bledsoe locked 15 degree flexion Other Brace: L knee hinged brace locked in 5-10' flexion (ortho does not want knee extended fully) Restrictions Weight Bearing Restrictions: Yes LLE Weight Bearing: Non weight bearing Other Position/Activity Restrictions: LLE  Therapy/Group: Individual Therapy AAlfonse AlpersPT, DPT   03/11/2020, 7:30 AM

## 2020-03-11 NOTE — Plan of Care (Signed)
  Problem: RH BOWEL ELIMINATION Goal: RH STG MANAGE BOWEL WITH ASSISTANCE Description: STG Manage Bowel with min Assistance. Outcome: Progressing Goal: RH STG MANAGE BOWEL W/MEDICATION W/ASSISTANCE Description: STG Manage Bowel with Medication with min Assistance. Outcome: Progressing   Problem: RH BLADDER ELIMINATION Goal: RH STG MANAGE BLADDER WITH ASSISTANCE Description: STG Manage Bladder With min Assistance Outcome: Progressing Goal: RH STG MANAGE BLADDER WITH MEDICATION WITH ASSISTANCE Description: STG Manage Bladder With Medication With min Assistance. Outcome: Progressing Goal: RH STG MANAGE BLADDER WITH EQUIPMENT WITH ASSISTANCE Description: STG Manage Bladder With Equipment With min Assistance Outcome: Progressing

## 2020-03-12 ENCOUNTER — Inpatient Hospital Stay (HOSPITAL_COMMUNITY): Payer: BC Managed Care – PPO | Admitting: Occupational Therapy

## 2020-03-12 ENCOUNTER — Inpatient Hospital Stay (HOSPITAL_COMMUNITY): Payer: BC Managed Care – PPO

## 2020-03-12 MED ORDER — OXYCODONE HCL 5 MG PO TABS
5.0000 mg | ORAL_TABLET | ORAL | Status: DC | PRN
  Administered 2020-03-12 – 2020-03-16 (×17): 10 mg via ORAL
  Filled 2020-03-12 (×20): qty 2

## 2020-03-12 MED ORDER — ZINC OXIDE 40 % EX OINT: TOPICAL_OINTMENT | Freq: Two times a day (BID) | CUTANEOUS | 0 refills | Status: DC

## 2020-03-12 MED ORDER — TRAMADOL HCL 50 MG PO TABS
50.0000 mg | ORAL_TABLET | Freq: Four times a day (QID) | ORAL | Status: DC | PRN
  Administered 2020-03-12 – 2020-03-16 (×5): 50 mg via ORAL
  Filled 2020-03-12 (×5): qty 1

## 2020-03-12 NOTE — Plan of Care (Signed)
  Problem: RH Bathing Goal: LTG Patient will bathe all body parts with assist levels (OT) Description: LTG: Patient will bathe all body parts with assist levels (OT) Flowsheets (Taken 03/12/2020 0831) LTG: Pt will perform bathing with assistance level/cueing: (downgraded) Minimal Assistance - Patient > 75% Note: Downgraded due to requiring assistance to wash LLE due to brace/immobility   Problem: RH Dressing Goal: LTG Patient will perform upper body dressing (OT) Description: LTG Patient will perform upper body dressing with assist, with/without cues (OT). Flowsheets (Taken 03/12/2020 0831) LTG: Pt will perform upper body dressing with assistance level of: (downgraded) Set up assist Note: Downgraded due to slow progress   Problem: RH Toileting Goal: LTG Patient will perform toileting task (3/3 steps) with assistance level (OT) Description: LTG: Patient will perform toileting task (3/3 steps) with assistance level (OT)  Flowsheets (Taken 03/12/2020 0831) LTG: Pt will perform toileting task (3/3 steps) with assistance level: (downgraded) Minimal Assistance - Patient > 75% Note: Downgraded due to requiring assistance to complete hygiene post BM and unable to access bathroom with bidet attachment

## 2020-03-12 NOTE — Progress Notes (Signed)
[facility] PHYSICAL MEDICINE & REHABILITATION PROGRESS NOTE   Subjective/Complaints: Patient seen laying in bed this morning.  She states she slept fairly well overnight with medications.  She notes improvement in pruritus, sleep, bladder overall.  She has questions regarding whether or not her stay will be extended.  ROS: Denies CP, SOB, N/V/D  Objective:   No results found. Recent Labs    03/11/20 1137  WBC 7.7  HGB 13.3  HCT 41.1  PLT 443*   Recent Labs    03/11/20 1137  NA 132*  K 4.0  CL 95*  CO2 22  GLUCOSE 104*  BUN 9  CREATININE 0.57  CALCIUM 9.4    Intake/Output Summary (Last 24 hours) at 03/12/2020 0847 Last data filed at 03/12/2020 0719 Gross per 24 hour  Intake 840 ml  Output 1300 ml  Net -460 ml    Physical Exam: Vital Signs Blood pressure 135/87, pulse 68, temperature 97.7 F (36.5 C), resp. rate 15, height 5\' 8"  (1.727 m), weight (!) 143.2 kg, SpO2 97 %. Constitutional: No distress . Vital signs reviewed. HENT: Normocephalic.  Atraumatic. Eyes: EOMI. No discharge. Cardiovascular: No JVD.  RRR. Respiratory: Normal effort.  No stridor.  Bilateral clear to auscultation. GI: Non-distended.  BS +. Skin: Warm and dry.  Intact. Psych: Slightly anxious.  Normal behavior. Musc: Left knee with edema and tenderness, persistent Neuro:  Alert Motor: Bilateral upper extremities: 5/5 proximal distal Right lower extremity: 4+/5 proximal distal Left lower extremity: Hip flexion 2+/5 (some pain inhibition), knee brace, ankle dorsiflexion 4+/5, slow improvement Sensation diminished light touch over left lower extremity first digit, improving.  Assessment/Plan: 1. Functional deficits which require 3+ hours per day of interdisciplinary therapy in a comprehensive inpatient rehab setting.  Physiatrist is providing close team supervision and 24 hour management of active medical problems listed below.  Physiatrist and rehab team continue to assess barriers to  discharge/monitor patient progress toward functional and medical goals  Care Tool:  Bathing    Body parts bathed by patient: Right arm, Left arm, Chest, Face, Abdomen, Front perineal area, Right upper leg, Left upper leg   Body parts bathed by helper: Buttocks, Left lower leg     Bathing assist Assist Level: Minimal Assistance - Patient > 75%     Upper Body Dressing/Undressing Upper body dressing   What is the patient wearing?: Dress    Upper body assist Assist Level: Set up assist    Lower Body Dressing/Undressing Lower body dressing    Lower body dressing activity did not occur:  (Pt had just completed hygiene with NT prior to OT arrival.) What is the patient wearing?: Underwear/pull up     Lower body assist Assist for lower body dressing: Minimal Assistance - Patient > 75%     Toileting Toileting    Toileting assist Assist for toileting: Maximal Assistance - Patient 25 - 49%     Transfers Chair/bed transfer  Transfers assist     Chair/bed transfer assist level: Contact Guard/Touching assist     Locomotion Ambulation   Ambulation assist   Ambulation activity did not occur: Safety/medical concerns          Walk 10 feet activity   Assist  Walk 10 feet activity did not occur: Safety/medical concerns        Walk 50 feet activity   Assist Walk 50 feet with 2 turns activity did not occur: Safety/medical concerns         Walk 150 feet activity  Assist Walk 150 feet activity did not occur: Safety/medical concerns         Walk 10 feet on uneven surface  activity   Assist Walk 10 feet on uneven surfaces activity did not occur: Safety/medical concerns         Wheelchair     Assist Will patient use wheelchair at discharge?: Yes Type of Wheelchair: Manual Wheelchair activity did not occur: Safety/medical concerns  Wheelchair assist level: Supervision/Verbal cueing Max wheelchair distance: 142ft    Wheelchair 50 feet with  2 turns activity    Assist    Wheelchair 50 feet with 2 turns activity did not occur: Safety/medical concerns   Assist Level: Supervision/Verbal cueing   Wheelchair 150 feet activity     Assist  Wheelchair 150 feet activity did not occur: Safety/medical concerns   Assist Level: Supervision/Verbal cueing   Blood pressure 135/87, pulse 68, temperature 97.7 F (36.5 C), resp. rate 15, height 5\' 8"  (1.727 m), weight (!) 143.2 kg, SpO2 97 %.  Medical Problem List and Plan: 1.  Decreased functional mobility secondary to left tibial plateau fracture.  Status post ORIF 02/25/2020.  Nonweightbearing left lower extremity with hinged knee brace locked in 10 degree flexion.  Ortho does not want knee extended fully  Continue CIR  Knee x-ray showing postoperative changes, plan for repeat films?  This week per Ortho 2.  Antithrombotics: -DVT/anticoagulation: Lovenox.    Creatinine within normal limits on 11/19  Vascular studies reviewed, negative for DVT             -antiplatelet therapy: N/A 3. Pain Management:    Oxycodone and Robaxin as needed  Increased gabapentin to home dose 900 mg BID   Lidoderm patch added on 11/16  Relatively controlled with meds on 11/23             Monitor with increased exertion 4. Mood: Provide emotional support  Klonopin 0.25 changed to nightly plus additional dose as needed at night, with improvement             -antipsychotic agents: N/A 5. Neuropsych: This patient is capable of making decisions on her own behalf. 6. Skin/Wound Care: Routine skin checks 7. Fluids/Electrolytes/Nutrition: Routine in and outs.   8.  Acute blood loss anemia: Resolved.               Hb 13.3 on 11/22  Cont to monitor  9.  Hypertension.  Norvasc 5 mg daily.  Vitals:   03/11/20 1930 03/12/20 0322  BP: (!) 140/55 135/87  Pulse: 72 68  Resp: 16 15  Temp: 97.8 F (36.6 C) 97.7 F (36.5 C)  SpO2: 97% 97%   Relatively controlled on 11/23 10.  History of alcohol tobacco  use.  Provide counseling 11.  Morbid obesity.  BMI 48.00 Encourage weight loss.  Dietary follow-up 12. Pruritis  Benadryl 25 mg q6 hours prn, added increased dose at bedtime nightly as needed  Discussed with nursing patient using dressings under brace at night to absorb perspiration  Large anxiety component  Improving overall 13. Hyponatremia  Na+ 132 on 11/22  Cont to monitor 14. Hypoalbuminemia  Supplement initiated on 11/15 15.  Drug-induced constipation  Bowel meds increased on 11/16, DC'd on 11/17  Improving 16.  Urinary urgency-baseline  PVRs relatively unremarkable   Enablex 7.5 qhs started on 11/20  Some improvement noted 17.  Sleep disturbance  See #4  Home melatonin restarted on 11/8  Overall improving with meds  LOS: 11 days A  FACE TO FACE EVALUATION WAS PERFORMED  Tanyia Grabbe Lorie Phenix 03/12/2020, 8:47 AM

## 2020-03-12 NOTE — Progress Notes (Signed)
Patient ID: [name], female   DOB: [birth year], 64 y.o.   MRN: [id number] family education scheduled for tomorrow with husband at 9-11:30 am. Pt to call [employer] regarding co-pays for equipment ordered.

## 2020-03-12 NOTE — Progress Notes (Signed)
Physical Therapy Session Note  Patient Details  Name: [name] MRN: [id number] Date of Birth: [birth year]  Today's Date: 03/12/2020 PT Individual Time: 0900-0954 PT Individual Time Calculation (min): 54 min   Short Term Goals: Week 1:  PT Short Term Goal 1 (Week 1): supine to/from sit w/supervision PT Short Term Goal 1 - Progress (Week 1): Met PT Short Term Goal 2 (Week 1): sliding board transfers w/level surfaces w/min assist of 1 PT Short Term Goal 2 - Progress (Week 1): Progressing toward goal PT Short Term Goal 3 (Week 1): sit to stand in parallel bars or w/bari walker w/mod assist NWBing LLE PT Short Term Goal 3 - Progress (Week 1): Met PT Short Term Goal 4 (Week 1): pt will maintain NWBing in standing w/LRAD x >1 min PT Short Term Goal 4 - Progress (Week 1): Met Week 2:  PT Short Term Goal 1 (Week 2): STG=LTG due to LOS  Skilled Therapeutic Interventions/Progress Updates:   Received pt sitting in WC, pt agreeable to therapy, and reported pain 3-4/10 in LLE (premedicated). Repositioning, rest breaks, and distraction done to reduce pain levels. Session with emphasis on functional mobility/transfers, generalized strengthening, dynamic standing balance/coordination, simulated car transfers, and improved activity tolerance. Pt performed WC mobility 157f using BUE and supervision to therapy gym with 4 rest breaks due to fatigue. Pt transported remainder of way [facility] for time management purposes. Discussed strategies for getting in/out of car as pt appeared anxious regarding this task and going over this multiple times in her head. Recommended pt get in on the backseat on driver's side and scooting back along seat to keep LLE in extension. Pt performed simulated car transfer with RW and CGA/close supervision and significantly increased time to scoot due to body habitus and poor endurance. Pt required verbal and visual cues for technique. Pt extremely verbose throughout  session and anxious about car transfer and required extensive rest breaks and increased time with mobility. Pt worked on [place] level 2.5 for 3 minutes forward and 3 minutes backwards for improved cardiovascular endurance. Discussed pt's husband coming in tomorrow for family education training from 9:30-11:30am. CSW and scheduling notified and pt agreed to notify her husband. [facility]. Concluded session with [facility].   Therapy Documentation Precautions:  Precautions Precautions: Knee Precaution Comments: Bledsoe locked 15 degree flexion Other Brace: L knee hinged brace locked in 5-10' flexion (ortho does not want knee extended fully) Restrictions Weight Bearing Restrictions: Yes LLE Weight Bearing: Non weight bearing Other Position/Activity Restrictions: LLE   Therapy/Group: Individual Therapy AAlfonse AlpersPT, DPT   03/12/2020, 7:25 AM

## 2020-03-12 NOTE — Progress Notes (Addendum)
Occupational Therapy Session Note  Patient Details  Name: [name] MRN: [id number] Date of Birth: [birth year]  Today's Date: 03/12/2020 OT Individual Time: 9767-3419 and 3790-2409 and 1420-1504 OT Individual Time Calculation (min): 60 min and 30 min and and 44 min   Short Term Goals: Week 2:  OT Short Term Goal 1 (Week 2): Patient will complete toileting task with min A OT Short Term Goal 2 (Week 2): STG = LTGs at supervision overall  Skilled Therapeutic Interventions/Progress Updates:    1) Treatment session with focus on self-care retraining, dynamic standing balance, transfers, and d/c planning. Pt received supine in bed with RN present assisting with urinal.  Pt unable to void with urinal at this time, requesting to transfer to [facility].  Therapist washed LLE and wrapped leg with ace wrap prior to reapplying brace and exiting bed.  Pt able to utilize leg lifter to lift leg to assist with washing LLE and for donning ace wrap for edema.  Pt able to complete bed mobility to sit to EOB with supervision with use of leg lifter.  Pt completed stand pivot transfer bed > BSC with RW with CGA from elevated EOB.  Pt required increased time due to attempting to complete on barefoot which did not pivot well.  Pt voided bowel and bladder on BSC.  Required assistance for hygiene due to body habitus with pt leaning forward for therapist to clean post BM.  Pt completed bathing and donned dress while seated on BSC.  Utilized reacher to [employer] with setup assist and therapist assisting to pull pants over hips.  Therapist assisted pt with donning tennis shoe to Rt foot.  Pt completed sit > stand from [facility] to RW with CGA and completed stand pivot transfer to w/c.  Pt then able to propel w/c to sinkside and engaged in grooming tasks from seated position at sink.  Discussed routine at home with bathing and toileting as pt will not be able to access bathroom upon d/c.  2) Treatment session with focus on sit >  stand and education on AE for LB dressing. Pt received upright in w/c reporting discomfort/pinching due to brief or brace into thigh.  Pt attempted sit > stand from w/c however due to pain and increased anxiety, pt unable to complete initially.  Therapist turned arm rests around to allow for UE support to push up beside self instead of behind, pt then able to stand with CGA to RW.  Pt weight shifted Rt and Lt in standing to adjust clothing and positioning.  Able to return to sitting with pinching sensation gone.  Therapist educated pt on sock aid, shoe funnel, and shoe horn with donning socks and shoes as pt reports having difficulty and requiring assistance to complete.  Pt pleased with demonstration and expressing desire to attempt during next session.  Pt remained upright in w/c with all needs in reach.  3) Treatment session with focus on functional transfers, dynamic standing balance, and use of AE For LB dressing.  Pt received upright in w/c reporting need to toilet.  Completed stand pivot transfer to Left with RW with CGA to [facility].  Pt able to complete clothing management pre and post toileting with CGA and able to complete hygiene post void.  Pt transferred BSC to EOB with RW with CGA. Engaged in education on use of reacher, sock aid, and shoe funnel with pt able to don sock to Left foot and donned and doffed shoe to Right foot with  use of reacher and shoe funnel.  Pt will benefit from continued practice to increase independence with LB dressing.  Pt returned to supine with use of leg lifter to advance LLE in to bed.  Pt remained semi-reclined in bed with all needs in reach.  Therapy Documentation Precautions:  Precautions Precautions: Knee Precaution Comments: Bledsoe locked 15 degree flexion Other Brace: L knee hinged brace locked in 5-10' flexion (ortho does not want knee extended fully) Restrictions Weight Bearing Restrictions: Yes LLE Weight Bearing: Non weight bearing Other Position/Activity  Restrictions: LLE Pain: Pain Assessment Pain Scale: 0-10 Pain Score: 7  Pain Location: Leg Patients Stated Pain Goal: 2   Therapy/Group: Individual Therapy  Simonne Come 03/12/2020, 8:59 AM

## 2020-03-13 ENCOUNTER — Ambulatory Visit (HOSPITAL_COMMUNITY): Payer: BC Managed Care – PPO

## 2020-03-13 ENCOUNTER — Encounter (HOSPITAL_COMMUNITY): Payer: BC Managed Care – PPO

## 2020-03-13 ENCOUNTER — Inpatient Hospital Stay (HOSPITAL_COMMUNITY): Payer: BC Managed Care – PPO

## 2020-03-13 MED ORDER — DARIFENACIN HYDROBROMIDE ER 15 MG PO TB24
15.0000 mg | ORAL_TABLET | Freq: Every day | ORAL | Status: DC
  Administered 2020-03-13: 15 mg via ORAL
  Filled 2020-03-13: qty 1

## 2020-03-13 NOTE — Progress Notes (Signed)
Inpatient Rehabilitation Care Coordinator  Discharge Note  The overall goal for the admission was met for: DC SAT 11/27  Discharge location: Yes-HOME WITH HUSBAND WHO CAN PROVIDE 24/7 CARE  Length of Stay: Yes-15 DAYS  Discharge activity level: Yes-SUPERVISION-MIN ASSIST LEVEL  Home/community participation: Yes  Services provided included: MD, RD, PT, OT, RN, CM, Pharmacy, Neuropsych and SW  Financial Services: Private Insurance: [place]  Follow-up services arranged: Home Health: [facility]-PT, DME: [facility] BEDSIDE COMMODE and Patient/Family has no preference for HH/DME agencies  Comments (or additional information):HUSBAND WAS IN [place]. READY FOR DISCHARGE HOME  Patient/Family verbalized understanding of follow-up arrangements: Yes  Individual responsible for coordination of the follow-up plan: SELF [phone] [facility] [phone]  Confirmed correct DME delivered: Elease Hashimoto 03/13/2020    Elease Hashimoto

## 2020-03-13 NOTE — Progress Notes (Addendum)
Orthopaedic Trauma Progress Note  SUBJECTIVE: Has been doing well with therapies.  Continues to have swelling throughout the left lower leg but this has improved slightly from last week.  Venous ultrasound was negative for DVT. Itching in the leg improving.  Very motivated to regain her strength.  OBJECTIVE:  Vitals:   03/12/20 1943 03/13/20 0452  BP: 126/62 133/64  Pulse: 69 64  Resp: 15 15  Temp: 98.2 F (36.8 C) 98 F (36.7 C)  SpO2: 98% 98%    General: Standing at bedside with walker, no acute distress Respiratory:  No increased work of breathing.  Left Lower Extremity: Bledsoe brace in place locked in 10 degrees of flexion.  Incision clean, dry, intact with steri-strips in place. Continues to have swelling throughout the lower leg, ankle, and foot.  Tenderness with palpation of the lower leg anteriorly and posteriorly.  Endorses mild discomfort with ankle dorsiflexion both actively and passively.  Sensation intact distally. Able to wiggle toes. Foot warm and well-perfused. Compartments soft and compressible. Otherwise neurovascularly intact  IMAGING: Repeat imaging left knee performed today stable. The proximal distal locking screw seen in the femoral nail appears fractured, but this is unchanged from previous imaging performed on 11/8, 11/10, and 11/16.   LABS: No results found for this or any previous visit (from the past 24 hour(s)).  ASSESSMENT: [name] is a 64 y.o. female s/p fall Injuries:1.  Left medial tibial plateau fracture s/p ORIF 02/26/2020 2. Left PCL/LCL injury 3. Left knee posterior capsular disruption  PLAN: Weightbearing: NWB LLE.  Okay for knee flexion in the brace as tolerated Incisional and dressing care: Okay to leave incisions open to air Showering: Okay to shower with assistance Orthopedic device(s): Hinged knee brace RLE locked in 10 degrees flexion  Pain management: Continue current regimen VTE prophylaxis: Lovenox Impediments to Fracture  Healing: Vitamin D level 18, continue home dose vitamin D supplementation Dispo: Continue with therapies as tolerated.  Recommend continuing to wrap left leg from toes to above knee with Ace wrap to help with swelling.  Will plan to keep hinged knee brace locked with about 10 degrees of flexion to prevent any hyperextension of the knee over the next several weeks.  Follow - up plan: We will continue to follow while in the hospital and plan for outpatient follow-up 2-3 weeks after hospital discharge for repeat x-rays  Contact information:  Katha Hamming MD, Patrecia Pace PA-C. After hours and holidays please check [email or web address] for group call information for [facility]   Jaliya Siegmann A. Ricci Barker, PA-C [phone] (office) [email or web address]

## 2020-03-13 NOTE — Progress Notes (Signed)
Physical Therapy Session Note  Patient Details  Name: [name] MRN: [id number] Date of Birth: 02-Aug-1955  Today's Date: 03/13/2020 PT Individual Time: 1030-1123 PT Individual Time Calculation (min): 53 min   Short Term Goals: Week 1:  PT Short Term Goal 1 (Week 1): supine to/from sit w/supervision PT Short Term Goal 1 - Progress (Week 1): Met PT Short Term Goal 2 (Week 1): sliding board transfers w/level surfaces w/min assist of 1 PT Short Term Goal 2 - Progress (Week 1): Progressing toward goal PT Short Term Goal 3 (Week 1): sit to stand in parallel bars or w/bari walker w/mod assist NWBing LLE PT Short Term Goal 3 - Progress (Week 1): Met PT Short Term Goal 4 (Week 1): pt will maintain NWBing in standing w/LRAD x >1 min PT Short Term Goal 4 - Progress (Week 1): Met Week 2:  PT Short Term Goal 1 (Week 2): STG=LTG due to LOS  Skilled Therapeutic Interventions/Progress Updates:   Received pt sitting in WC in ADL apartment after OT session with husband present for family education training. Pt did not state pain level during session. Session with emphasis on family education, discharge planning, functional mobility/transfers, generalized strengthening, dynamic standing balance/coordinaiton, simulated car transfers, and improved activity tolerance. Pt transported to ortho gym in [facility] total A for energy conservation purposes. Pt's husband actively participated with all hands on practice during all functional mobility throughout session. Pt performed simulated car transfer with RW and CGA provided by pt's husband. Pt able to direct husband in set up, technique, and assist required for transfer as pt will scoot back on the car seat with LLE extended. Pt with improved RLE foot clearance with pivoting today and actually able to take small "hops"! Therapist educated pt/pt's husband on body mechanics and positioning and need to position himself on pt's L side in case of LOB; pt and husband verbalized  understanding. Pt required increased time and maximal rest breaks during session due to fatigue, poor activity tolerance, and pt being hyper-talkative. Therapist educated pt's husband on WC parts management (donning/doffing legrests) and positioning and adjustments for Bledsoe brace; pt's husband verbalized and demonstrated understanding. Pt performed WC mobility >179f using BUE and supervision back to room with 5 rest breaks throughout due to fatigue. [facility]. Therapist provided fresh drinks for pt and ice pack for L knee.   Therapy Documentation Precautions:  Precautions Precautions: Knee Precaution Comments: Bledsoe locked 15 degree flexion Other Brace: L knee hinged brace locked in 5-10' flexion (ortho does not want knee extended fully) Restrictions Weight Bearing Restrictions: Yes LLE Weight Bearing: Non weight bearing Other Position/Activity Restrictions: LLE   Therapy/Group: Individual Therapy AAlfonse AlpersPT, DPT   03/13/2020, 7:18 AM

## 2020-03-13 NOTE — Progress Notes (Signed)
[place] PHYSICAL MEDICINE & REHABILITATION PROGRESS NOTE   Subjective/Complaints: Patient seen laying in bed this morning.  She states she slept fairly overnight due to urinary frequency, no other issues.  She notes her leg is sore after therapies, encouraged ice after each session.  ROS: Denies CP, SOB, N/V/D  Objective:   No results found. Recent Labs    03/11/20 1137  WBC 7.7  HGB 13.3  HCT 41.1  PLT 443*   Recent Labs    03/11/20 1137  NA 132*  K 4.0  CL 95*  CO2 22  GLUCOSE 104*  BUN 9  CREATININE 0.57  CALCIUM 9.4    Intake/Output Summary (Last 24 hours) at 03/13/2020 0903 Last data filed at 03/13/2020 0500 Gross per 24 hour  Intake 474 ml  Output 2550 ml  Net -2076 ml    Physical Exam: Vital Signs Blood pressure 133/64, pulse 64, temperature 98 F (36.7 C), resp. rate 15, height 5\' 8"  (1.727 m), weight (!) 143.2 kg, SpO2 98 %. Constitutional: No distress . Vital signs reviewed. HENT: Normocephalic.  Atraumatic. Eyes: EOMI. No discharge. Cardiovascular: No JVD.  RRR. Respiratory: Normal effort.  No stridor.  Bilateral clear to auscultation. GI: Non-distended.  BS +. Skin: Warm and dry.  Intact. Psych: Anxious.  Normal mood. Musc: Left knee with edema and tenderness, unchanged. Neuro:  Alert Motor: Bilateral upper extremities: 5/5 proximal distal Right lower extremity: 4+/5 proximal distal Left lower extremity: Hip flexion 2+/5 (some pain inhibition), knee brace, ankle dorsiflexion 4+/5, unchanged Sensation diminished light touch over left lower extremity first digit, improving.  Assessment/Plan: 1. Functional deficits which require 3+ hours per day of interdisciplinary therapy in a comprehensive inpatient rehab setting.  Physiatrist is providing close team supervision and 24 hour management of active medical problems listed below.  Physiatrist and rehab team continue to assess barriers to discharge/monitor patient progress toward functional  and medical goals  Care Tool:  Bathing    Body parts bathed by patient: Right arm, Left arm, Chest, Abdomen, Front perineal area, Right upper leg, Left upper leg, Face   Body parts bathed by helper: Buttocks, Left lower leg     Bathing assist Assist Level: Minimal Assistance - Patient > 75%     Upper Body Dressing/Undressing Upper body dressing   What is the patient wearing?: Dress    Upper body assist Assist Level: Set up assist    Lower Body Dressing/Undressing Lower body dressing    Lower body dressing activity did not occur:  (Pt had just completed hygiene with NT prior to OT arrival.) What is the patient wearing?: Underwear/pull up     Lower body assist Assist for lower body dressing: Minimal Assistance - Patient > 75%     Toileting Toileting    Toileting assist Assist for toileting: Maximal Assistance - Patient 25 - 49%     Transfers Chair/bed transfer  Transfers assist     Chair/bed transfer assist level: Contact Guard/Touching assist     Locomotion Ambulation   Ambulation assist   Ambulation activity did not occur: Safety/medical concerns          Walk 10 feet activity   Assist  Walk 10 feet activity did not occur: Safety/medical concerns        Walk 50 feet activity   Assist Walk 50 feet with 2 turns activity did not occur: Safety/medical concerns         Walk 150 feet activity   Assist Walk 150 feet activity  did not occur: Safety/medical concerns         Walk 10 feet on uneven surface  activity   Assist Walk 10 feet on uneven surfaces activity did not occur: Safety/medical concerns         Wheelchair     Assist Will patient use wheelchair at discharge?: Yes Type of Wheelchair: Manual Wheelchair activity did not occur: Safety/medical concerns  Wheelchair assist level: Supervision/Verbal cueing Max wheelchair distance: 140ft    Wheelchair 50 feet with 2 turns activity    Assist    Wheelchair 50  feet with 2 turns activity did not occur: Safety/medical concerns   Assist Level: Supervision/Verbal cueing   Wheelchair 150 feet activity     Assist  Wheelchair 150 feet activity did not occur: Safety/medical concerns   Assist Level: Supervision/Verbal cueing   Blood pressure 133/64, pulse 64, temperature 98 F (36.7 C), resp. rate 15, height 5\' 8"  (1.727 m), weight (!) 143.2 kg, SpO2 98 %.  Medical Problem List and Plan: 1.  Decreased functional mobility secondary to left tibial plateau fracture.  Status post ORIF 02/25/2020.  Nonweightbearing left lower extremity with hinged knee brace locked in 10 degree flexion.  Ortho does not want knee extended fully  Continue CIR  Knee x-ray showing postoperative changes, plan for repeat films?  This week per Ortho  Team conference today to discuss current and goals and coordination of care, home and environmental barriers, and discharge planning with nursing, case manager, and therapies. Please see conference note from today as well.  2.  Antithrombotics: -DVT/anticoagulation: Lovenox.    Creatinine within normal limits on 11/19  Vascular studies reviewed, negative for DVT             -antiplatelet therapy: N/A 3. Pain Management:    Oxycodone and Robaxin as needed  Increased gabapentin to home dose 900 mg BID   Lidoderm patch added on 11/16  Relatively controlled with meds on 11/24  Encouraged ice after each therapy session to left knee             Monitor with increased exertion 4. Mood: Provide emotional support  Klonopin 0.25 changed to nightly plus additional dose as needed at night, with improvement             -antipsychotic agents: N/A 5. Neuropsych: This patient is capable of making decisions on her own behalf. 6. Skin/Wound Care: Routine skin checks 7. Fluids/Electrolytes/Nutrition: Routine in and outs.   8.  Acute blood loss anemia: Resolved.               Hb 13.3 on 11/22  Cont to monitor  9.  Hypertension.  Norvasc 5  mg daily.  Vitals:   03/12/20 1943 03/13/20 0452  BP: 126/62 133/64  Pulse: 69 64  Resp: 15 15  Temp: 98.2 F (36.8 C) 98 F (36.7 C)  SpO2: 98% 98%   Relatively controlled on 11/23 10.  History of alcohol tobacco use.  Provide counseling 11.  Morbid obesity.  BMI 48.00 Encourage weight loss.  Dietary follow-up 12. Pruritis  Benadryl 25 mg q6 hours prn, added increased dose at bedtime nightly as needed  Discussed with nursing patient using dressings under brace at night to absorb perspiration  Large anxiety component  Improving 13. Hyponatremia  Na+ 132 on 11/22, labs ordered for tomorrow  Cont to monitor 14. Hypoalbuminemia  Supplement initiated on 11/15 15.  Drug-induced constipation  Bowel meds increased on 11/16, DC'd on 11/17  Improving 16.  Urinary urgency-baseline  PVRs relatively unremarkable   Enablex 7.5 qhs started on 11/20, increased on 11/24  Some improvement noted 17.  Sleep disturbance  See #4, #16  Home melatonin restarted on 11/8  Overall improving with meds  LOS: 12 days A FACE TO FACE EVALUATION WAS PERFORMED  Aala Ransom Lorie Phenix 03/13/2020, 9:03 AM

## 2020-03-13 NOTE — Progress Notes (Signed)
Occupational Therapy Session Note  Patient Details  Name: [name] MRN: [id number] Date of Birth: April 16, 1956  Today's Date: 03/13/2020 OT Individual Time: 1219-7588 OT Individual Time Calculation (min): 75 min    Short Term Goals: Week 1:  OT Short Term Goal 1 (Week 1): With use of AE, patient will complete LB dressing with min A OT Short Term Goal 1 - Progress (Week 1): Met OT Short Term Goal 2 (Week 1): Patient will complete toilet transfer with min A OT Short Term Goal 2 - Progress (Week 1): Met OT Short Term Goal 3 (Week 1): Patient will complete toileting task with min A OT Short Term Goal 3 - Progress (Week 1): Progressing toward goal  Skilled Therapeutic Interventions/Progress Updates:    1:1. Pt received in bed agreeable to OT with husband don present for family education. Pt took initiative to direct husband through care with OT interjecting on setting up transfers, body mechanics, guarding assist, hand placement, and AE. Pt able to complete all transfers with min guard and RW via stand pivot to R. Pt husband practices 1 transfer BSC>w/c and provides all peri care after BM in forward lean onto RW. Pt able to don dress with set up, shoe with set up and thread BLE into pull up with reacher requiring A for advancing pants past hips with 1 seated rest break. Pt grooms at sink and exited session with pt and husband in ADL apartment awaiting PT. Husband demo good understanding of taught strategies and good functioanl communication throughtou transfer  Therapy Documentation Precautions:  Precautions Precautions: Knee Precaution Comments: Bledsoe locked 15 degree flexion Other Brace: L knee hinged brace locked in 5-10' flexion (ortho does not want knee extended fully) Restrictions Weight Bearing Restrictions: Yes LLE Weight Bearing: Non weight bearing Other Position/Activity Restrictions: LLE General:   Vital Signs:   Pain:   ADL:   Vision   Perception    Praxis    Exercises:   Other Treatments:     Therapy/Group: Individual Therapy  [name] 03/13/2020, 9:18 AM

## 2020-03-13 NOTE — Patient Care Conference (Signed)
Inpatient RehabilitationTeam Conference and Plan of Care Update Date: 03/13/2020   Time: 11:41 AM    Patient Name: [name]      Medical Record Number: [id number]  Date of Birth: 1955-12-30 Sex: Female         Room/Bed: 4W04C/4W04C-01 Payor Info: Payor: [place] / Plan: [employer] COMM PPO / Product Type: *No Product type* /    Admit Date/Time:  03/01/2020  9:26 PM  Primary Diagnosis:  Left medial tibial plateau fracture  Hospital Problems: Principal Problem:   Left medial tibial plateau fracture Active Problems:   Post-operative pain   Benign essential HTN   Pruritus   Urinary urgency   Drug induced constipation   Anxiety state   Pain   Urinary frequency   Sleep disturbance    Expected Discharge Date: Expected Discharge Date: 03/16/20  Team Members Present: Physician leading conference: Dr. Delice Lesch Care Coodinator Present: Becky Dupree, LCSW;Elona Yinger Hervey Ard, RN, BSN, [place] Nurse Present: Other (comment) Jeanette Caprice RN) PT Present: Becky Sax, PT OT Present: Mariane Masters, OT PPS Coordinator present : Gunnar Fusi, SLP     Current Status/Progress Goal Weekly Team Focus  Bowel/Bladder   pt cont of b and b can have some urgency  remain cont of b and b  assess q shift and prn   Swallow/Nutrition/ Hydration             ADL's   Min A LB bathing and dressing, S bed mobility with leg lifter, CGA sit > stand and stand pivot transfers, Min A toileting  downgraded to Supervision overall, Min A bathing and toileting  ADL retraining, sit > stand, dynamic standing balance, use of AE for LB dressing, transfers   Mobility   bed mobility supervision with gait belt as leg lifter, sit<>stand with RW CGA, stand<>pivot transfer with RW CGA, WC mobility 174ft supervision  Mod I WC mob, supervision transfers  functional mobility/transfers, generalized strengthening, dynamic standing balance/coordination, pain control, improved endurance, and education   Communication              Safety/Cognition/ Behavioral Observations            Pain   pt complains of 6-7 /1- pain on left knee  decrease pain to 2/10  Assess q shift and prn and medicate as needed   Skin   ot has surgical incision on left leg. bruising behind left  knee  healthy healing and remain free from breakdown        Discharge Planning:  Husband coming in for education today in prepartion for discharge Sat. equipment ordered and home health in place   Team Discussion: MD adjusting medications for anxiety and puritis and add medication for urinary urgency/frequency. Patient is incontinent at times due to urinary frequency.  Patient on target to meet rehab goals: yes, mod I - supervision goals set. Currently CGA for transfers and min assist for bathing and toileting of lower body.  *See Care Plan and progress notes for long and short-term goals.   Revisions to Treatment Plan:   Teaching Needs: Transfers, toileting, medications, etc.  Current Barriers to Discharge: Decreased caregiver support and Incontinence  Possible Resolutions to Barriers:  Family education completed 03/13/20 HH follow up services for PT    Medical Summary Current Status: Decreased functional mobility secondary to left tibial plateau fracture.  Status post ORIF 02/25/2020  Barriers to Discharge: Medical stability;Behavior;Weight   Possible Resolutions to [employer]: Therapies, optimize pain meds, anxiety meds, follow  labs - Na   Continued Need for Acute Rehabilitation Level of Care: The patient requires daily medical management by a physician with specialized training in physical medicine and rehabilitation for the following reasons: Direction of a multidisciplinary physical rehabilitation program to maximize functional independence : Yes Medical management of patient stability for increased activity during participation in an intensive rehabilitation regime.: Yes Analysis of laboratory values and/or  radiology reports with any subsequent need for medication adjustment and/or medical intervention. : Yes   I attest that I was present, lead the team conference, and concur with the assessment and plan of the team.   Dorien Chihuahua B 03/13/2020, 12:24 PM

## 2020-03-13 NOTE — Progress Notes (Signed)
Patient ID: [name], female   DOB: [birth year], 64 y.o.   MRN: [id number]  Met with pt and husband who is here for education team conference progression toward her goals and discharge still 11/27. Home heath set up and equipment being delivered to her home-per her request.

## 2020-03-13 NOTE — Progress Notes (Signed)
Occupational Therapy Session Note  Patient Details  Name: [name] MRN: [id number] Date of Birth: 1956/01/19  Today's Date: 03/13/2020 OT Individual Time: 9396-8864 OT Individual Time Calculation (min): 60 min    Short Term Goals: Week 1:  OT Short Term Goal 1 (Week 1): With use of AE, patient will complete LB dressing with min A OT Short Term Goal 1 - Progress (Week 1): Met OT Short Term Goal 2 (Week 1): Patient will complete toilet transfer with min A OT Short Term Goal 2 - Progress (Week 1): Met OT Short Term Goal 3 (Week 1): Patient will complete toileting task with min A OT Short Term Goal 3 - Progress (Week 1): Progressing toward goal  Skilled Therapeutic Interventions/Progress Updates:    1;1. Pt received in bed agreeable to OT. Pt worked up and reporting need to toilet. Pt anxious about transfers, but completes w/c>BSC>EOB with CGA overall all to R with use of RW and increased time for pt to process set up. Pain improved with repositioning and ace wrapping at end of session. Pt completes BM and bladder on toilet and requires total A for toileting d/t fatigue. Pt transfers back to bed and manages Les into bed with leg lifter gait belt. Pt able to lift LE and direct OT in ace wrapping of LE for edema management. Exited session with pt seated in bed, exit alarm on and call light in reach   Therapy Documentation Precautions:  Precautions Precautions: Knee Precaution Comments: Bledsoe locked 15 degree flexion Other Brace: L knee hinged brace locked in 5-10' flexion (ortho does not want knee extended fully) Restrictions Weight Bearing Restrictions: Yes LLE Weight Bearing: Non weight bearing Other Position/Activity Restrictions: LLE General:   Vital Signs:   Pain: Pain Assessment Pain Scale: 0-10 Pain Score: 8  Pain Type: Acute pain Pain Location: Leg Pain Orientation: Left Pain Descriptors / Indicators: Aching Pain Frequency: Intermittent Patients Stated Pain Goal:  0 Pain Intervention(s): Medication (See eMAR);Cold applied;Elevated extremity ADL:   Vision   Perception    Praxis   Exercises:   Other Treatments:     Therapy/Group: Individual Therapy  [name] 03/13/2020, 12:24 PM

## 2020-03-14 ENCOUNTER — Inpatient Hospital Stay (HOSPITAL_COMMUNITY): Payer: BC Managed Care – PPO

## 2020-03-14 DIAGNOSIS — M79662 Pain in left lower leg: Secondary | ICD-10-CM

## 2020-03-14 LAB — BASIC METABOLIC PANEL
Anion gap: 12 (ref 5–15)
BUN: 9 mg/dL (ref 8–23)
CO2: 22 mmol/L (ref 22–32)
Calcium: 8.8 mg/dL — ABNORMAL LOW (ref 8.9–10.3)
Chloride: 103 mmol/L (ref 98–111)
Creatinine, Ser: 0.54 mg/dL (ref 0.44–1.00)
GFR, Estimated: >60 mL/min (ref >60)
Glucose, Bld: 93 mg/dL (ref 70–99)
Potassium: 3.8 mmol/L (ref 3.5–5.1)
Sodium: 137 mmol/L (ref 135–145)

## 2020-03-14 MED ORDER — VITAMIN D 25 MCG (1000 UNIT) PO TABS
2000.0000 [IU] | ORAL_TABLET | Freq: Two times a day (BID) | ORAL | Status: DC
  Administered 2020-03-14 – 2020-03-16 (×5): 2000 [IU] via ORAL
  Filled 2020-03-14 (×5): qty 2

## 2020-03-14 MED ORDER — PREGABALIN 75 MG PO CAPS
75.0000 mg | ORAL_CAPSULE | Freq: Three times a day (TID) | ORAL | Status: DC
  Administered 2020-03-14 – 2020-03-15 (×3): 75 mg via ORAL
  Filled 2020-03-14 (×3): qty 1

## 2020-03-14 MED ORDER — PREGABALIN 75 MG PO CAPS: 75.0000 mg | ORAL_CAPSULE | Freq: Two times a day (BID) | ORAL | Status: DC

## 2020-03-14 MED ORDER — ASCORBIC ACID 500 MG PO TABS
1000.0000 mg | ORAL_TABLET | Freq: Every day | ORAL | Status: DC
  Administered 2020-03-14 – 2020-03-16 (×3): 1000 mg via ORAL
  Filled 2020-03-14 (×4): qty 2

## 2020-03-14 MED ORDER — GABAPENTIN 300 MG PO CAPS
900.0000 mg | ORAL_CAPSULE | Freq: Every day | ORAL | Status: DC
Start: 2020-03-15 — End: 2020-03-14

## 2020-03-14 MED ORDER — METAXALONE 800 MG PO TABS
800.0000 mg | ORAL_TABLET | Freq: Three times a day (TID) | ORAL | Status: DC
  Administered 2020-03-14 – 2020-03-16 (×6): 800 mg via ORAL
  Filled 2020-03-14 (×8): qty 1

## 2020-03-14 MED ORDER — PREGABALIN 75 MG PO CAPS: 75.0000 mg | ORAL_CAPSULE | Freq: Three times a day (TID) | ORAL | Status: DC

## 2020-03-14 NOTE — Progress Notes (Signed)
Orthopedic Tech Progress Note Patient Details:  [name] 06/03/1955 [id number] Called in order to [facility] for ROM Knee Brace. Patient ID: [name], female   DOB: 10-Oct-1955, 64 y.o.   MRN: [id number]   Chip Boer 03/14/2020, 1:00 PM

## 2020-03-14 NOTE — Progress Notes (Signed)
This nurse administered PRN oxy to the pt per request. Pt c/o new 10/10 LLE pain. Ace wrap removed to assess the LLE. Below the incision leg was hot to touch, red and discolored, 2+ edema, unable to palpate pulse due to increased pain with palpation and edema. Pts toes cold to touch, but able to make small movement with them. Pt denies any numbness in the LLE. Pt describes pain as new, burning, tingling, throbbing, constant pain. Pt also states she has never felt this pain prior to now.  Pts primary nurse Geryl Rankins, RN notified. Charge Research scientist (medical) notified.

## 2020-03-14 NOTE — Progress Notes (Addendum)
Orthopaedic Trauma Service Progress Note  Patient ID: [name] MRN: [id number] DOB/AGE: February 18, 1956 64 y.o.  Subjective:  Contacted by inpatient rehab team regarding increased left lower leg pain. Patient states that she is having severe pain in her left leg yesterday afternoon and then it abated for period of time and then started back up around 1 AM. She states it is a lightening in nature primarily located around her calf.  History of L5 nerve root issues for which she has had numerous treatments for in the past in [place] including rhizotomies and what sounds to be epidural steroid injections. States she would have radiation down her left leg. She is also about 2 years s/p intramedullary nailing of left femur which did heal uneventfully. She does have a broken distal interlocking screw in the nail but it is not backing out.  Patient did have a Doppler ultrasound on 03/03/2020 which was negative  Has been on lovenox 40 mg sq daily   Weight: 143 kg   BMI about 48  Patient is laying in urine as she does not want to get up out of bed due to the pain Hinged knee brace is soaked with urine  Patient was tolerant of compression wrap yesterday removed  ROS As above  Objective:   VITALS:   Vitals:   03/13/20 0452 03/13/20 1338 03/13/20 2017 03/14/20 0459  BP: 133/64 135/80 134/60 (!) 115/52  Pulse: 64 78 79 68  Resp: 15 15 18 20   Temp: 98 F (36.7 C) 97.6 F (36.4 C) 97.7 F (36.5 C) (!) 97.5 F (36.4 C)  TempSrc:   Oral Oral  SpO2: 98% 99% 100% 99%  Weight:      Height:        Estimated body mass index is 48 kg/m as calculated from the following:   Height as of this encounter: 5\' 8"  (1.727 m).   Weight as of this encounter: 143.2 kg.   Intake/Output      11/24 0701 - 11/25 0700 11/25 0701 - 11/26 0700   P.O. 440 240   Total Intake(mL/kg) 440 (3.1) 240 (1.7)   Urine (mL/kg/hr) 1125  (0.3)    Stool     Total Output 1125    Net -685 +240        Urine Occurrence 4 x      LABS  Results for orders placed or performed during the hospital encounter of 03/01/20 (from the past 24 hour(s))  Basic metabolic panel     Status: Abnormal   Collection Time: 03/14/20  3:53 AM  Result Value Ref Range   Sodium 137 135 - 145 mmol/L   Potassium 3.8 3.5 - 5.1 mmol/L   Chloride 103 98 - 111 mmol/L   CO2 22 22 - 32 mmol/L   Glucose, Bld 93 70 - 99 mg/dL   BUN 9 8 - 23 mg/dL   Creatinine, Ser 0.54 0.44 - 1.00 mg/dL   Calcium 8.8 (L) 8.9 - 10.3 mg/dL   GFR, Estimated >60 >60 mL/min   Anion gap 12 5 - 15     PHYSICAL EXAM:   Gen: Very very talkative, pleasant.  Answers questions in a long circuitous manner, animated  Lungs: unlabored Cardiac: regular  Ext:       Left Lower Extremity   Incision  is clean dry and intact left knee.  Steri-Strips are in place  Hinged brace is unbuckled    Hinges are all on different settings    Brace was locked out completely as well    Ecchymosis appears to be stable to the medial left leg  Moderate swelling to left leg  Edema is nonpitting  Hyperesthesias to the mid calf as well as mid anterior lower leg  Some hyperemia to her toes and heel noted.  No open wounds or other areas concerning for infection  Extremity is warm  + DP pulse  EHL, FHL, lesser toe motor functions intact  Ankle flexion, extension, inversion and eversion are grossly intact  Thigh nontender  Assessment/Plan:     Principal Problem:   Left medial tibial plateau fracture Active Problems:   Post-operative pain   Benign essential HTN   Pruritus   Urinary urgency   Drug induced constipation   Anxiety state   Pain   Urinary frequency   Sleep disturbance   Pain in left shin   Anti-infectives (From admission, onward)   None    .   64 y.o. female s/p fall  -Left medial tibial plateau fracture s/p ORIF 02/26/2020     Left knee posterior capsular disruption      LCL injury, cruciate ligament injuries (segond fracture on imaging)     ? Persistent L lower leg pain:  CRPS, ? DVT       NWB L leg   ROM with therapies    Think brace could be contributing to pain.  Ok to leave brace off when in bed, put on when mobilizing    Elevate leg above heart as much as possible     Can try heat instead of ice to see if this helps    Repeat Doppler U/S as it has been 11 days since last doppler    Pt is obese, immobile and nicotine user (vapes- cartridge lasts about 4 days)    Compression once tolerable     Discussed with PMR team     Add vitamin C     Change gabapentin to lyrica      Trial of skelaxin, dc robaxin           - Dispo:  Continue per PMR team     Jari Pigg, PA-C [phone] (C) 03/14/2020, 11:02 AM  [facility] [place] [address] [phone] Jenetta [phone] (F)    After 5pm and on the weekends please log on to Amion, go to orthopaedics and the look under the [facility] Call for the provider(s) on call. You can also call our office at [phone] and then follow the prompts to be connected to the call team.

## 2020-03-14 NOTE — Progress Notes (Signed)
[facility] PHYSICAL MEDICINE & REHABILITATION PROGRESS NOTE   Subjective/Complaints: Patient seen laying in bed this morning.  She states she did not sleep well overnight due to excruciating left "shin" pain that she states started spontaneously at night.  She notes it is 10 out of 10 she notes that it is so bad that she did not want to move to urinate and has now soiled herself but does not want to get cleaned for fear of increased pain.  Discussed with nursing, discussed with Ortho, who is going to evaluate.  He was seen by Ortho yesterday, notes reviewed-repeat films ordered, stable  ROS: Denies CP, SOB, N/V/D  Objective:   DG Knee 1-2 Views Left  Result Date: 03/13/2020 CLINICAL DATA:  Persistent knee pain following surgery, initial encounter EXAM: LEFT KNEE - 2 VIEW COMPARISON:  03/05/2020, 02/25/2020 FINDINGS: Medullary rod is noted in the distal left femur stable from the prior exam. Fixation screws are seen in the distal femur. The proximal fixation screw is somewhat angulated and may be fractured. This appearance is stable from the prior exams. New fixation sideplate is noted along the proximal tibia medially. The overall appearance of the tibial plateau fracture is stable. There remains a small bony density adjacent to the lateral tibial plateau consistent with prior avulsion. No joint effusion is seen. No gross soft tissue abnormality is noted. IMPRESSION: Stable appearance of the knee when compared with the prior exam of 8 days previous. Electronically Signed   By: Inez Catalina M.D.   On: 03/13/2020 09:52   DG Tibia/Fibula Left Port  Result Date: 03/14/2020 CLINICAL DATA:  64 year old female with knee pain. EXAM: PORTABLE LEFT TIBIA AND FIBULA - 2 VIEW COMPARISON:  Tibia fibula and knee radiographs from 02/26/2020 and FINDINGS: No acute fracture or malalignment. Similar appearance of the medial proximal tibial plate and screw apparatus and the partially visualized distal femoral nail  without evidence of new hardware fracture, loosening, or malalignment. Normal bony mineralization. Mild calcaneal plantar enthesopathy. Diffuse lower extremity soft tissue edema and scattered phleboliths. IMPRESSION: No acute fracture or malalignment. Similar appearance of medial proximal tibial plate and screw apparatus after ORIF. Electronically Signed   By: Ruthann Cancer MD   On: 03/14/2020 09:19   Recent Labs    03/11/20 1137  WBC 7.7  HGB 13.3  HCT 41.1  PLT 443*   Recent Labs    03/11/20 1137 03/14/20 0353  NA 132* 137  K 4.0 3.8  CL 95* 103  CO2 22 22  GLUCOSE 104* 93  BUN 9 9  CREATININE 0.57 0.54  CALCIUM 9.4 8.8*    Intake/Output Summary (Last 24 hours) at 03/14/2020 0954 Last data filed at 03/14/2020 0816 Gross per 24 hour  Intake 680 ml  Output 1125 ml  Net -445 ml    Physical Exam: Vital Signs Blood pressure (!) 115/52, pulse 68, temperature (!) 97.5 F (36.4 C), temperature source Oral, resp. rate 20, height 5\' 8"  (1.727 m), weight (!) 143.2 kg, SpO2 99 %. Constitutional: Distressed. Vital signs reviewed. HENT: Normocephalic.  Atraumatic. Eyes: EOMI. No discharge. Cardiovascular: No JVD.  RRR.  Bedside duplex WNL. Respiratory: Normal effort.  No stridor.  Bilateral clear to auscultation. GI: Non-distended.  BS +. Skin: Warm and dry.  Erythema in toes on LLE. Psych: Anxious.  Extreme pain. Musc: Left knee with edema and tenderness.  Left mid tibia with extreme tenderness. Neuro:  Alert Sensation diminished to light touch in the toes Allodynia over left tibia  Motor: Bilateral upper extremities: 5/5 proximal distal Right lower extremity: 4+/5 proximal distal Left lower extremity: Previously-hip flexion 2+/5 (some pain inhibition), knee brace, ankle dorsiflexion 4+/5; unwilling to move today due to severe pain  Assessment/Plan: 1. Functional deficits which require 3+ hours per day of interdisciplinary therapy in a comprehensive inpatient rehab  setting.  Physiatrist is providing close team supervision and 24 hour management of active medical problems listed below.  Physiatrist and rehab team continue to assess barriers to discharge/monitor patient progress toward functional and medical goals  Care Tool:  Bathing    Body parts bathed by patient: Right arm, Left arm, Chest, Abdomen, Front perineal area, Right upper leg, Left upper leg, Face   Body parts bathed by helper: Buttocks, Left lower leg     Bathing assist Assist Level: Minimal Assistance - Patient > 75%     Upper Body Dressing/Undressing Upper body dressing   What is the patient wearing?: Dress    Upper body assist Assist Level: Set up assist    Lower Body Dressing/Undressing Lower body dressing    Lower body dressing activity did not occur:  (Pt had just completed hygiene with NT prior to OT arrival.) What is the patient wearing?: Underwear/pull up     Lower body assist Assist for lower body dressing: Minimal Assistance - Patient > 75%     Toileting Toileting    Toileting assist Assist for toileting: Maximal Assistance - Patient 25 - 49%     Transfers Chair/bed transfer  Transfers assist     Chair/bed transfer assist level: Contact Guard/Touching assist     Locomotion Ambulation   Ambulation assist   Ambulation activity did not occur: Safety/medical concerns          Walk 10 feet activity   Assist  Walk 10 feet activity did not occur: Safety/medical concerns        Walk 50 feet activity   Assist Walk 50 feet with 2 turns activity did not occur: Safety/medical concerns         Walk 150 feet activity   Assist Walk 150 feet activity did not occur: Safety/medical concerns         Walk 10 feet on uneven surface  activity   Assist Walk 10 feet on uneven surfaces activity did not occur: Safety/medical concerns         Wheelchair     Assist Will patient use wheelchair at discharge?: Yes Type of  Wheelchair: Manual Wheelchair activity did not occur: Safety/medical concerns  Wheelchair assist level: Supervision/Verbal cueing Max wheelchair distance: 15ft    Wheelchair 50 feet with 2 turns activity    Assist    Wheelchair 50 feet with 2 turns activity did not occur: Safety/medical concerns   Assist Level: Supervision/Verbal cueing   Wheelchair 150 feet activity     Assist  Wheelchair 150 feet activity did not occur: Safety/medical concerns   Assist Level: Supervision/Verbal cueing   Blood pressure (!) 115/52, pulse 68, temperature (!) 97.5 F (36.4 C), temperature source Oral, resp. rate 20, height 5\' 8"  (1.727 m), weight (!) 143.2 kg, SpO2 99 %.  Medical Problem List and Plan: 1.  Decreased functional mobility secondary to left tibial plateau fracture.  Status post ORIF 02/25/2020.  Nonweightbearing left lower extremity with hinged knee brace locked in 10 degree flexion.  Ortho does not want knee extended fully  Continue CIR  Knee x-ray showing postoperative changes, plan for repeat films personally reviewed, stable 2.  Antithrombotics: -DVT/anticoagulation: Lovenox.  Creatinine within normal limits on 11/19  Vascular studies reviewed, negative for DVT             -antiplatelet therapy: N/A 3. Pain Management:    Oxycodone and Robaxin as needed  Increased gabapentin to home dose 900 mg BID   Lidoderm patch added on 11/16  Encouraged ice after each therapy session to left knee             ?  CRPS-will await Ortho evaluation and consider treatment unremarkable  Monitor with increased exertion 4. Mood: Provide emotional support  Klonopin 0.25 changed to nightly plus additional dose as needed at night, with improvement             -antipsychotic agents: N/A 5. Neuropsych: This patient is capable of making decisions on her own behalf. 6. Skin/Wound Care: Routine skin checks 7. Fluids/Electrolytes/Nutrition: Routine in and outs.   8.  Acute blood loss anemia:  Resolved.               Hb 13.3 on 11/22  Cont to monitor  9.  Hypertension.  Norvasc 5 mg daily.  Vitals:   03/13/20 2017 03/14/20 0459  BP: 134/60 (!) 115/52  Pulse: 79 68  Resp: 18 20  Temp: 97.7 F (36.5 C) (!) 97.5 F (36.4 C)  SpO2: 100% 99%   Controlled on 11/25 10.  History of alcohol tobacco use.  Provide counseling 11.  Morbid obesity.  BMI 48.00 Encourage weight loss.  Dietary follow-up 12. Pruritis  Benadryl 25 mg q6 hours prn, added increased dose at bedtime nightly as needed  Discussed with nursing patient using dressings under brace at night to absorb perspiration  Large anxiety component  Improving 13. Hyponatremia  Na+ 137 on 11/25  Cont to monitor 14. Hypoalbuminemia  Supplement initiated on 11/15 15.  Drug-induced constipation  Bowel meds increased on 11/16, DC'd on 11/17  Improving 16.  Urinary urgency-baseline  PVRs relatively unremarkable   Enablex 7.5 qhs started on 11/20, increased on 11/24, DC'd on 11/25 due to?  Temporal relationship with pain (> 1% incidence) 17.  Sleep disturbance  See #4, #16  Home melatonin restarted on 11/8  Had been improving 18. Left tibia pain-?  CRPS  Discussed with Ortho-to evaluate  X-ray ordered  See #3, #16  LOS: 13 days A FACE TO FACE EVALUATION WAS PERFORMED  Mariselda Badalamenti Lorie Phenix 03/14/2020, 9:54 AM

## 2020-03-14 NOTE — Discharge Summary (Addendum)
Physician Discharge Summary  Patient ID: [name] MRN: [id number] DOB/AGE: [birth year] 64 y.o.  Admit date: 03/01/2020 Discharge date: 03/16/2020  Discharge Diagnoses:  Principal Problem:   Left medial tibial plateau fracture Active Problems:   Post-operative pain   Benign essential HTN   Pruritus   Urinary urgency   Drug induced constipation   Anxiety state   Pain   Urinary frequency   Sleep disturbance   Pain in left shin Acute blood loss anemia Morbid obesity Tobacco and alcohol use.  Discharged Condition: Stable  Significant Diagnostic Studies: DG Knee 1-2 Views Left  Result Date: 03/13/2020 CLINICAL DATA:  Persistent knee pain following surgery, initial encounter EXAM: LEFT KNEE - 2 VIEW COMPARISON:  03/05/2020, 02/25/2020 FINDINGS: Medullary rod is noted in the distal left femur stable from the prior exam. Fixation screws are seen in the distal femur. The proximal fixation screw is somewhat angulated and may be fractured. This appearance is stable from the prior exams. New fixation sideplate is noted along the proximal tibia medially. The overall appearance of the tibial plateau fracture is stable. There remains a small bony density adjacent to the lateral tibial plateau consistent with prior avulsion. No joint effusion is seen. No gross soft tissue abnormality is noted. IMPRESSION: Stable appearance of the knee when compared with the prior exam of 8 days previous. Electronically Signed   By: Inez Catalina M.D.   On: 03/13/2020 09:52   DG Knee 1-2 Views Left  Result Date: 03/05/2020 CLINICAL DATA:  Knee pain.  Tibial plateau fracture. EXAM: LEFT KNEE - 1-2 VIEW COMPARISON:  02/26/2020 FINDINGS: Locking intramedullary rod extending into the distal femur unchanged. Medial tibial plateau fracture with surgical fixation. Medial plate and screws. Depression of the tibial plateau unchanged from prior study. Small avulsion fracture of the lateral aspect of the lateral tibial  plateau unchanged from prior studies. No new fracture. IMPRESSION: No acute findings and no change from 02/26/2020 Electronically Signed   By: Franchot Gallo M.D.   On: 03/05/2020 08:29   DG Tibia/Fibula Left  Result Date: 02/26/2020 CLINICAL DATA:  ORIF tibial plateau fracture. EXAM: LEFT TIBIA AND FIBULA - 2 VIEW; DG C-ARM 1-60 MIN FLUOROSCOPY TIME:  1 minute, 21 seconds (7.9 mGy) COMPARISON:  Left knee radiographs-02/25/2020; left knee CT-02/25/2020 FINDINGS: Seven spot intraoperative fluoroscopic images of the left knee are provided for review and demonstrate the C quell a of sideplate fixation of previously identified impacted medial tibial plateau fracture. Alignment appears near anatomic. Note is also made of the distal end of a intramedullary femoral rod, incompletely evaluated. No radiopaque foreign body. IMPRESSION: Post sideplate fixation of medial tibial plateau fracture without evidence of complication. Electronically Signed   By: Sandi Mariscal M.D.   On: 02/26/2020 13:51   CT Knee Left Wo Contrast  Result Date: 02/25/2020 CLINICAL DATA:  Tibial plateau fracture. EXAM: CT OF THE LEFT KNEE WITHOUT CONTRAST TECHNIQUE: Multidetector CT imaging of the left knee was performed according to the standard protocol. Multiplanar CT image reconstructions were also generated. COMPARISON:  Left knee radiographs, earlier today FINDINGS: Bones/Joint/Cartilage There is an acute, comminuted, intra-articular fracture involving the medial tibial plateau. There is mild depression of the anterior fracture fragments by approximately 5 mm. Signs of lateral collateral ligament avulsion injury is also noted with small osseous fragments noted adjacent to the lateral femoral condyle and lateral tibial plateau, image 46/6 and image 51/6. Additionally, nondisplaced fracture of the styloid process of the fibula is suspected which may be related  to lateral collateral ligament injury and/or arcuate ligament avulsion fracture.,  image 81/3. IM nail and screws are noted within the distal femoral diaphysis. Ligaments Suboptimally assessed by CT. Muscles and Tendons Unremarkable. Soft tissues Small suprapatellar joint effusion. Diffuse soft tissue stranding is identified within the subcutaneous fat surrounding the left knee. Small radio dense foreign body is noted within the subcutaneous soft tissues anterior to the proximal tibia measures 2 mm. IMPRESSION: 1. Acute, comminuted, intra-articular fracture involving the medial tibial plateau with mild depression of the anterior fracture fragments. 2. Signs of lateral collateral ligament avulsion injury is noted with small osseous fragments adjacent to the lateral femoral condyle and lateral tibial plateau. 3. Nondisplaced fracture of the styloid process of the fibula is suspected which may be related to lateral collateral ligament injury and/or arcuate ligament avulsion fracture. 4. Small suprapatellar joint effusion. 5. Small radio dense foreign body is noted within the subcutaneous soft tissues anterior to the proximal tibia. Electronically Signed   By: Kerby Moors M.D.   On: 02/25/2020 05:26   DG Knee Complete 4 Views Left  Result Date: 02/25/2020 CLINICAL DATA:  Pain EXAM: LEFT KNEE - COMPLETE 4+ VIEW COMPARISON:  None. FINDINGS: There is an acute, depressed medial tibial plateau fracture. There is a small suprapatellar joint effusion. Mild tricompartmental degenerative changes are noted. There is soft tissue swelling about the knee. The patient has undergone prior intramedullary nail placement through the femur. The second most distal interlocking screw is bent. IMPRESSION: 1. Acute, depressed medial tibial plateau fracture. 2. Mild tricompartmental degenerative changes are noted. Electronically Signed   By: Constance Holster M.D.   On: 02/25/2020 03:02   DG Knee Left Port  Result Date: 02/26/2020 CLINICAL DATA:  64 year old female postoperative films following ORIF. EXAM:  PORTABLE LEFT KNEE - 1-2 VIEW COMPARISON:  Intraoperative images 12 45 hours today. FINDINGS: AP and cross-table lateral views. Pre-existing distal left femur intramedullary rod and distal interlocking cortical screws. New anterior/medial proximal tibia plate and screws. No adverse hardware features. No new osseous abnormality identified. IMPRESSION: Proximal left tibia ORIF with no adverse features. Electronically Signed   By: Genevie Ann M.D.   On: 02/26/2020 18:13   DG Tibia/Fibula Left Port  Result Date: 03/14/2020 CLINICAL DATA:  64 year old female with knee pain. EXAM: PORTABLE LEFT TIBIA AND FIBULA - 2 VIEW COMPARISON:  Tibia fibula and knee radiographs from 02/26/2020 and FINDINGS: No acute fracture or malalignment. Similar appearance of the medial proximal tibial plate and screw apparatus and the partially visualized distal femoral nail without evidence of new hardware fracture, loosening, or malalignment. Normal bony mineralization. Mild calcaneal plantar enthesopathy. Diffuse lower extremity soft tissue edema and scattered phleboliths. IMPRESSION: No acute fracture or malalignment. Similar appearance of medial proximal tibial plate and screw apparatus after ORIF. Electronically Signed   By: Ruthann Cancer MD   On: 03/14/2020 09:19   DG C-Arm 1-60 Min  Result Date: 02/26/2020 CLINICAL DATA:  ORIF tibial plateau fracture. EXAM: LEFT TIBIA AND FIBULA - 2 VIEW; DG C-ARM 1-60 MIN FLUOROSCOPY TIME:  1 minute, 21 seconds (7.9 mGy) COMPARISON:  Left knee radiographs-02/25/2020; left knee CT-02/25/2020 FINDINGS: Seven spot intraoperative fluoroscopic images of the left knee are provided for review and demonstrate the C quell a of sideplate fixation of previously identified impacted medial tibial plateau fracture. Alignment appears near anatomic. Note is also made of the distal end of a intramedullary femoral rod, incompletely evaluated. No radiopaque foreign body. IMPRESSION: Post sideplate fixation of medial  tibial plateau fracture without evidence of complication. Electronically Signed   By: Sandi Mariscal M.D.   On: 02/26/2020 13:51   DG FEMUR MIN 2 VIEWS LEFT  Result Date: 02/28/2020 CLINICAL DATA:  Left femoral fracture. EXAM: LEFT FEMUR 2 VIEWS COMPARISON:  February 26, 2020. FINDINGS: Status post intramedullary rod fixation of left femur for treatment of old healed proximal left femoral fracture. No acute fracture or dislocation is noted involving the left femur. IMPRESSION: Status post intramedullary rod fixation of old healed proximal left femoral fracture. Electronically Signed   By: Marijo Conception M.D.   On: 02/28/2020 13:47   VAS [place] LOWER EXTREMITY VENOUS (DVT)  Result Date: 03/04/2020  Lower Venous DVT Study Indications: Swelling.  Risk Factors: Surgery Trauma. Limitations: Body habitus, poor ultrasound/tissue interface, bandages, orthopaedic appliance and patient positioning, patient immobility, patient pain tolerance. Comparison Study: No prior studies. Performing Technologist: Oliver Hum RVT  Examination Guidelines: A complete evaluation includes B-mode imaging, spectral Doppler, color Doppler, and power Doppler as needed of all accessible portions of each vessel. Bilateral testing is considered an integral part of a complete examination. Limited examinations for reoccurring indications may be performed as noted. The reflux portion of the exam is performed with the patient in reverse Trendelenburg.  +---------+---------------+---------+-----------+----------+--------------+ RIGHT    CompressibilityPhasicitySpontaneityPropertiesThrombus Aging +---------+---------------+---------+-----------+----------+--------------+ CFV      Full           Yes      Yes                                 +---------+---------------+---------+-----------+----------+--------------+ SFJ      Full                                                         +---------+---------------+---------+-----------+----------+--------------+ FV Prox  Full                                                        +---------+---------------+---------+-----------+----------+--------------+ FV Mid   Full                                                        +---------+---------------+---------+-----------+----------+--------------+ FV DistalFull                                                        +---------+---------------+---------+-----------+----------+--------------+ PFV      Full                                                        +---------+---------------+---------+-----------+----------+--------------+ POP      Full  Yes      Yes                                 +---------+---------------+---------+-----------+----------+--------------+ PTV      Full                                                        +---------+---------------+---------+-----------+----------+--------------+ PERO     Full                                                        +---------+---------------+---------+-----------+----------+--------------+   +---------+---------------+---------+-----------+----------+-------------------+ LEFT     CompressibilityPhasicitySpontaneityPropertiesThrombus Aging      +---------+---------------+---------+-----------+----------+-------------------+ CFV      Full           Yes      Yes                                      +---------+---------------+---------+-----------+----------+-------------------+ SFJ      Full                                                             +---------+---------------+---------+-----------+----------+-------------------+ FV Prox  Full                                                             +---------+---------------+---------+-----------+----------+-------------------+ FV Mid   Full                                                              +---------+---------------+---------+-----------+----------+-------------------+ FV Distal               Yes      Yes                                      +---------+---------------+---------+-----------+----------+-------------------+ PFV      Full                                                             +---------+---------------+---------+-----------+----------+-------------------+ POP  Not well visualized +---------+---------------+---------+-----------+----------+-------------------+ PTV      Full                                                             +---------+---------------+---------+-----------+----------+-------------------+ PERO                                                  Not well visualized +---------+---------------+---------+-----------+----------+-------------------+     Summary: RIGHT: - There is no evidence of deep vein thrombosis in the lower extremity. However, portions of this examination were limited- see technologist comments above.  - No cystic structure found in the popliteal fossa.  LEFT: - There is no evidence of deep vein thrombosis in the lower extremity. However, portions of this examination were limited- see technologist comments above.  - No cystic structure found in the popliteal fossa.  *See table(s) above for measurements and observations. Electronically signed by Servando Snare MD on 03/04/2020 at 4:33:35 PM.    Final     Labs:  Basic Metabolic Panel: Recent Labs  Lab 03/08/20 0445 03/11/20 1137 03/14/20 0353  NA  --  132* 137  K  --  4.0 3.8  CL  --  95* 103  CO2  --  22 22  GLUCOSE  --  104* 93  BUN  --  9 9  CREATININE 0.57 0.57 0.54  CALCIUM  --  9.4 8.8*    CBC: Recent Labs  Lab 03/11/20 1137  WBC 7.7  NEUTROABS 5.7  HGB 13.3  HCT 41.1  MCV 94.3  PLT 443*    CBG: No results for input(s): GLUCAP in the last 168 hours.  Family history.  Positive  for hypertension and hyperlipidemia.  Denies any colon cancer esophageal cancer or rectal cancer Brief HPI:   [name] is a 64 y.o. right-handed female with history of morbid obesity BMI 35.93 bipolar disorder hypertension as well as tobacco alcohol use left femur fracture 2 years ago that required surgical intervention.  Patient lives with spouse 1 level home ramped entrance.  Independent with assistive device.  Presented 02/25/2020 after a fall when her knee gave out from under her.  Denied loss of consciousness.  Admission chemistries hemoglobin 13.1 sodium 124 glucose 123.  X-rays and imaging revealed left medial tibial plateau fracture.  Underwent ORIF left tibial plateau fracture 02/26/2020 per Dr. Doreatha Martin.  Placed on Lovenox for DVT prophylaxis.  Nonweightbearing left lower extremity with left hinged knee brace locked in 5 to 10 degree flexion.  Hospital course acute blood loss anemia 10.9 and monitored.  Therapy evaluations completed and patient was admitted for a comprehensive rehab program.   Hospital Course: [name] was admitted to rehab 03/01/2020 for inpatient therapies to consist of PT, ST and OT at least three hours five days a week. Past admission physiatrist, therapy team and rehab RN have worked together to provide customized collaborative inpatient rehab.  Pertaining to patient's left tibial plateau fracture status post ORIF 02/25/2020.  Nonweightbearing.  Hinged knee brace locked in 10 degree flexion.  Neurovascular sensation intact.  Patient would follow-up with orthopedic services.  Lovenox for DVT prophylaxis.  Venous Doppler studies negative.  Pain managed with use of oxycodone and Skelaxin 800 mg 3 times daily as needed as well as Lyrica 75 mg 3 times daily.  Lidoderm patch was added for pain control.  Mood stabilization with Klonopin and she was attending full therapies.  Patient was using melatonin to help aid in sleep.  Acute blood loss anemia 13.3 no bleeding episodes.  Blood  pressure controlled on Norvasc 5 mg daily.  Morbid obesity BMI 48 dietary follow-up.  History of tobacco alcohol abuse patient received counsel regarding cessation of nicotine alcohol products.   Blood pressures were monitored on TID basis and controlled     Rehab course: During patient's stay in rehab weekly team conferences were held to monitor patient's progress, set goals and discuss barriers to discharge. At admission, patient required moderate assist sit to supine minimal assist supine to sit max assist sit to stand.  Minimal assist upper body bathing max is lower body bathing set up upper body dressing total assist lower body dressing  Physical exam.  Blood pressure 150/71 pulse 68 temperature 97.8 respirations 18 oxygen saturation 97% room air Constitutional.  No acute distress HEENT Head.  Normocephalic and atraumatic Eyes.  Pupils round and reactive to light no discharge.nystagmus Neck.  Supple nontender no JVD without thyromegaly Cardiac regular rate rhythm without any extra sounds or murmur heard Abdomen.  Soft nontender positive bowel sounds without rebound Respiratory effort normal no respiratory distress without wheeze Musculoskeletal.  Normal range of motion.  Left knee with edema and tenderness Skin.  Left knee incision clean and dry Neurologic.  Alert oriented follows commands Motor.  Bilateral upper extremities 5/5 proximal distal Right lower extremity 4+/5 proximal distal Left lower extremity hip flexion 2/5 some pain inhibition knee brace ankle dorsiflexion 4+/5  /She  has had improvement in activity tolerance, balance, postural control as well as ability to compensate for deficits. [name] has had improvement in functional use RUE/LUE  and RLE/LLE as well as improvement in awareness.  Sessions with emphasis on family education functional mobility transfers and generalized strengthening.  Patient performs simulated car transfers rolling walker contact-guard assist.   Patient able to direct husband and set up.  Patient showed improved right lower extremity foot clearance with pivoting.  Required some increased time and maximal rest breaks during sessions.  Patient able to don dress with set up shoe with set up and thread bilateral lower extremity into pull up with reacher requiring assist for advancing pants past hips.  Patient groomed at sink.  Full family teaching completed plan discharge to home       Disposition: Discharge to home    Diet: Regular  Special Instructions: No driving smoking or alcohol  Nonweightbearing left lower extremity with hinged knee brace as directed  Medications at discharge 1.  Tylenol as needed 2.  Norvasc 5 mg daily 3.  Vitamin C 1000 mg daily 4.  Vitamin D 2000 units twice daily 5.  Klonopin 0.25 mg nightly and as needed 6.  Lidoderm patch change as directed 7.  Melatonin 10 mg nightly 8.  Skelaxin 800 mg 3 times daily 9.  Lyrica 75 mg 3 times daily 10.  Vitamin D 50,000 units every 7 days  30-35 minutes were spent completing discharge summary and discharge planning  Discharge Instructions     Ambulatory referral to Physical Medicine Rehab   Complete by: As directed    Follow-up 1 month left tibial plateau fracture        Follow-up Information  Jamse Arn, MD Follow up.   Specialty: Physical Medicine and Rehabilitation Why: Office to call for appointment Contact information: [address] [place] [address] [phone]         Shona Needles, MD. Schedule an appointment as soon as possible for a visit.   Specialty: Orthopedic Surgery Why: 2-3 weeks for repeat x-rays and wound check Contact information: [place] [address] [phone]         [name], MD. Call on 03/18/2020.   Specialty: Family Medicine Why: for post hospital follow up Contact information: [place] [address] [phone]                  Signed: Cathlyn Parsons 03/14/2020, 6:19 PM Patient was seen, face-face, and physical exam performed by me on day of discharge, greater than 30 minutes of total time spent.. Please see progress note from day of discharge as well.  Delice Lesch, MD, ABPMR

## 2020-03-15 ENCOUNTER — Inpatient Hospital Stay (HOSPITAL_COMMUNITY): Payer: BC Managed Care – PPO

## 2020-03-15 ENCOUNTER — Inpatient Hospital Stay (HOSPITAL_COMMUNITY): Payer: BC Managed Care – PPO | Admitting: Occupational Therapy

## 2020-03-15 DIAGNOSIS — M79609 Pain in unspecified limb: Secondary | ICD-10-CM

## 2020-03-15 DIAGNOSIS — M7989 Other specified soft tissue disorders: Secondary | ICD-10-CM

## 2020-03-15 DIAGNOSIS — G90522 Complex regional pain syndrome I of left lower limb: Secondary | ICD-10-CM

## 2020-03-15 LAB — CREATININE, SERUM
Creatinine, Ser: 0.63 mg/dL (ref 0.44–1.00)
GFR, Estimated: >60 mL/min (ref >60)

## 2020-03-15 LAB — VITAMIN D 25 HYDROXY (VIT D DEFICIENCY, FRACTURES): Vit D, 25-Hydroxy: 28.94 ng/mL — ABNORMAL LOW (ref 30–100)

## 2020-03-15 MED ORDER — CLONAZEPAM 0.25 MG PO TBDP: 0.2500 mg | ORAL_TABLET | Freq: Four times a day (QID) | ORAL | 0 refills | Status: DC

## 2020-03-15 MED ORDER — ASCORBIC ACID 1000 MG PO TABS: 1000.0000 mg | ORAL_TABLET | Freq: Every day | ORAL | 0 refills | Status: DC

## 2020-03-15 MED ORDER — SENNA 8.6 MG PO TABS: 1.0000 | ORAL_TABLET | Freq: Two times a day (BID) | ORAL | 0 refills | Status: DC

## 2020-03-15 MED ORDER — PREDNISONE 1 MG PO TABS: 2.0000 mg | ORAL_TABLET | Freq: Every day | ORAL | Status: DC

## 2020-03-15 MED ORDER — PREDNISONE 1 MG PO TABS: 2.0000 mg | ORAL_TABLET | Freq: Two times a day (BID) | ORAL | Status: DC

## 2020-03-15 MED ORDER — LIDOCAINE 5 % EX PTCH: 1.0000 | MEDICATED_PATCH | CUTANEOUS | 0 refills | Status: DC

## 2020-03-15 MED ORDER — PREDNISONE 5 MG PO TABS: 8.0000 mg | ORAL_TABLET | Freq: Two times a day (BID) | ORAL | Status: DC

## 2020-03-15 MED ORDER — CLONAZEPAM 0.25 MG PO TBDP
0.2500 mg | ORAL_TABLET | Freq: Three times a day (TID) | ORAL | Status: DC
  Administered 2020-03-15 – 2020-03-16 (×3): 0.25 mg via ORAL
  Filled 2020-03-15 (×2): qty 1

## 2020-03-15 MED ORDER — VITAMIN D3 25 MCG PO TABS: 2000.0000 [IU] | ORAL_TABLET | Freq: Two times a day (BID) | ORAL | 0 refills | Status: DC

## 2020-03-15 MED ORDER — MELATONIN 10 MG PO TABS: 10.0000 mg | ORAL_TABLET | Freq: Every day | ORAL | 0 refills | Status: DC

## 2020-03-15 MED ORDER — TRAMADOL HCL 50 MG PO TABS: 50.0000 mg | ORAL_TABLET | Freq: Four times a day (QID) | ORAL | 0 refills | Status: DC | PRN

## 2020-03-15 MED ORDER — PREGABALIN 100 MG PO CAPS: 100.0000 mg | ORAL_CAPSULE | Freq: Three times a day (TID) | ORAL | 0 refills | Status: DC

## 2020-03-15 MED ORDER — PREDNISONE 5 MG PO TABS
8.0000 mg | ORAL_TABLET | Freq: Four times a day (QID) | ORAL | Status: DC
  Administered 2020-03-15 – 2020-03-16 (×5): 8 mg via ORAL
  Filled 2020-03-15 (×8): qty 3

## 2020-03-15 MED ORDER — OXYCODONE HCL 10 MG PO TABS
5.0000 mg | ORAL_TABLET | ORAL | 0 refills | Status: DC | PRN
Start: 2020-03-15 — End: 2020-03-29

## 2020-03-15 MED ORDER — PREDNISONE 2 MG PO TBEC: DELAYED_RELEASE_TABLET | ORAL | 0 refills | Status: DC

## 2020-03-15 MED ORDER — PREDNISONE 1 MG PO TABS: 1.0000 mg | ORAL_TABLET | ORAL | Status: DC | PRN

## 2020-03-15 MED ORDER — PREDNISONE 1 MG PO TABS: 4.0000 mg | ORAL_TABLET | Freq: Two times a day (BID) | ORAL | Status: DC

## 2020-03-15 MED ORDER — VITAMIN D (ERGOCALCIFEROL) 1.25 MG (50000 UNIT) PO CAPS: 50000.0000 [IU] | ORAL_CAPSULE | ORAL | 0 refills | Status: DC

## 2020-03-15 MED ORDER — METHOCARBAMOL 500 MG PO TABS: 500.0000 mg | ORAL_TABLET | Freq: Four times a day (QID) | ORAL | 0 refills | Status: DC | PRN

## 2020-03-15 MED ORDER — AMLODIPINE BESYLATE 5 MG PO TABS: 5.0000 mg | ORAL_TABLET | Freq: Every day | ORAL | 0 refills | Status: DC

## 2020-03-15 MED ORDER — PREGABALIN 50 MG PO CAPS
100.0000 mg | ORAL_CAPSULE | Freq: Three times a day (TID) | ORAL | Status: DC
  Administered 2020-03-15 – 2020-03-16 (×3): 100 mg via ORAL
  Filled 2020-03-15 (×3): qty 2

## 2020-03-15 NOTE — Progress Notes (Signed)
Lower extremity venous has been completed.   Preliminary results in CV Proc.   Abram Sander 03/15/2020 1:36 PM

## 2020-03-15 NOTE — Progress Notes (Signed)
Occupational Therapy Session Note  Patient Details  Name: [name] MRN: [id number] Date of Birth: 06-22-1955  Today's Date: 03/15/2020 OT Individual Time: 0141-0301 OT Individual Time Calculation (min): 60 min  and Today's Date: 03/15/2020 OT Missed Time: 15 Minutes Missed Time Reason: Pain   Short Term Goals: Week 2:  OT Short Term Goal 1 (Week 2): Patient will complete toileting task with min A OT Short Term Goal 2 (Week 2): STG = LTGs at supervision overall  Skilled Therapeutic Interventions/Progress Updates:    Treatment session with focus on self-care retraining and functional transfers.  Pt received semi-reclined in bed reporting extreme pain and discomfort in LLE.  RN notified for requested pain meds. Pt reports plan to have ultrasound later in the day and reports update from MD regarding pain and plan.  Pt reports already dressed and completed toileting during PT session, but wanting to do "what I need to" during session.  Discussed current level with self-care tasks of bathing and dressing, including donning socks/shoes.  Pt able to don and doff Rt shoe with shoe funnel and don socks to BLE with use of sock aid. Completed bed mobility Mod I with leg lifter and completed stand pivot transfer to w/c with Supervision with RW.  Pt propelled w/c to sink to complete grooming tasks without assistance.  Reiterated home setup and use of BSC for toileting needs, as bathroom not accessible via w/c.  Pt pleased with progress and realizes that she needs to continue to build up endurance to OOB tolerance.  Pt requested to return to bed due to pain in LLE, notified RN again as pt due for next dose of pain meds.  Pt returned to bed stand pivot with RW with supervision and pt able to position back in bed Mod I with increased time.  Pt remained semi-reclined in bed with all needs in reach.  Therapy Documentation Precautions:  Precautions Precautions: Knee Precaution Comments: Bledsoe brace locked 15  degree flexion Required Braces or Orthoses: Other Brace Other Brace: L knee Bledsoe brace locked in 15 degrees flexion (ortho does not want knee extended fully) Restrictions Weight Bearing Restrictions: Yes LLE Weight Bearing: Non weight bearing Other Position/Activity Restrictions: LLE Pain: Pain Assessment Pain Scale: 0-10 Pain Score: 10 Pain Location: Leg Pain Orientation: Left Pain Intervention(s): RN notified, repositioned   Therapy/Group: Individual Therapy  Simonne Come 03/15/2020, 10:22 AM

## 2020-03-15 NOTE — Progress Notes (Signed)
Physical Therapy Discharge Summary  Patient Details  Name: [name] MRN: [id number] Date of Birth: 12-26-1955  Patient has met 6 of 6 long term goals due to improved activity tolerance, improved balance, improved postural control, increased strength, decreased pain and improved coordination. Patient to discharge at a wheelchair level Supervision. Patient's care partner is independent to provide the necessary physical assistance at discharge. Pt's husband attended family education on 11/24 and verbalized and demonstrated confidence with all transfers and tasks to ensure safe discharge home. Pt and husband with no further questions regarding mobility/   All goals met  Recommendation:  Patient will benefit from ongoing skilled PT services in home health setting to continue to advance safe functional mobility, address ongoing impairments in transfers, generalized strengthening, dynamic standing balance/coordination, endurance, and to minimize fall risk.  Equipment: 24x18 manual WC with L elevating legrest and bariatric RW  Reasons for discharge: treatment goals met  Patient/family agrees with progress made and goals achieved: Yes   PT Discharge Precautions/Restrictions Precautions Precautions: Knee Precaution Comments: Bledsoe brace locked 15 degree flexion Required Braces or Orthoses: Other Brace Other Brace: L knee Bledsoe brace locked in 15 degrees flexion (ortho does not want knee extended fully) Restrictions Weight Bearing Restrictions: Yes LLE Weight Bearing: Non weight bearing Cognition Overall Cognitive Status: Within Functional Limits for tasks assessed Arousal/Alertness: Awake/alert Orientation Level: Oriented X4 Memory: Appears intact Awareness: Appears intact Problem Solving: Appears intact Safety/Judgment: Appears intact Sensation Sensation Light Touch: Impaired by gross assessment Proprioception: Appears Intact Additional Comments: pt reported "leathery"  sensation along LLE comapred to RLE Coordination Gross Motor Movements are Fluid and Coordinated: No Fine Motor Movements are Fluid and Coordinated: Yes Coordination and Movement Description: mild uncoordination due to LLE NWB status, body habitus, decreased endurance, and generalized weakness Finger Nose Finger Test: [facility] bilaterally Heel Shin Test: WFL on LLE unable to perform on RLE due to [facility] brace Motor  Motor Motor: Within Functional Limits Motor - Skilled Clinical Observations: mild uncoordination due to LLE NWB status, body habitus, decreased endurance, and generalized weakness  Mobility Bed Mobility Bed Mobility: Rolling Right;Rolling Left;Supine to Sit;Sit to Supine Rolling Right: Independent with assistive device Rolling Left: Independent with assistive device Supine to Sit: Independent with assistive device Sit to Supine: Independent with assistive device Transfers Transfers: Sit to Stand;Stand to Sit;Stand Pivot Transfers Sit to Stand: Supervision/Verbal cueing Stand to Sit: Supervision/Verbal cueing Stand Pivot Transfers: Supervision/Verbal cueing Stand Pivot Transfer Details: Verbal cues for technique;Verbal cues for safe use of DME/AE Stand Pivot Transfer Details (indicate cue type and reason): occasional cues for RW positioning Transfer (Assistive device): Rolling walker Locomotion  Gait Ambulation: No Gait Gait: No Stairs / Additional Locomotion Stairs: No Wheelchair Mobility Wheelchair Mobility: Yes Wheelchair Assistance: Independent with Camera operator: Both upper extremities Wheelchair Parts Management: Needs assistance Distance: 151f  Trunk/Postural Assessment  Cervical Assessment Cervical Assessment: Within Functional Limits Thoracic Assessment Thoracic Assessment: Within Functional Limits Lumbar Assessment Lumbar Assessment: Within Functional Limits Postural Control Postural Control: Within Functional Limits   Balance Balance Balance Assessed: Yes Static Sitting Balance Static Sitting - Balance Support: Feet supported;No upper extremity supported Static Sitting - Level of Assistance: 7: Independent Dynamic Sitting Balance Dynamic Sitting - Balance Support: Feet supported;No upper extremity supported Dynamic Sitting - Level of Assistance: 7: Independent Static Standing Balance Static Standing - Balance Support: Bilateral upper extremity supported (RW) Static Standing - Level of Assistance: 5: Stand by assistance (supervision) Dynamic Standing Balance Dynamic Standing - Balance Support: Bilateral  upper extremity supported (RW) Dynamic Standing - Level of Assistance: 5: Stand by assistance (supervision) Extremity Assessment  RLE Assessment RLE Assessment: Within Functional Limits LLE Assessment LLE Assessment: Exceptions to [facility] Passive Range of Motion (PROM) Comments: Bledsoe brace locked 15 degrees flexion Active Range of Motion (AROM) Comments: ankle WFL General Strength Comments: grossly generalized to 3+/5 (hip abd/add and flexion)  Alfonse Alpers PT, DPT  03/15/2020, 7:39 AM

## 2020-03-15 NOTE — Progress Notes (Signed)
Occupational Therapy Session Note  Patient Details  Name: [name] MRN: [id number] Date of Birth: [birth year]  Today's Date: 03/15/2020 OT Individual Time: 1345-1418 OT Individual Time Calculation (min): 33 min  and Today's Date: 03/15/2020 OT Missed Time: 42 Minutes Missed Time Reason: Patient unwilling/refused to participate without medical reason;Pain   Short Term Goals: Week 2:  OT Short Term Goal 1 (Week 2): Patient will complete toileting task with min A OT Short Term Goal 2 (Week 2): STG = LTGs at supervision overall  Skilled Therapeutic Interventions/Progress Updates:    Treatment session with focus on d/c planning.  Pt received supine in bed having just completed using urinal for void with assistance from nursing staff.  Pt reports pain in LLE and reports not feeling up to any therapy this afternoon.  Pt reports overwhelmed by upcoming d/c in regards to needing to have FMLA papers filled out, questions about equipment for d/c, and reports that her husband has injured his foot and therefore she may need to arrange transportation home.  Therapist attempted to answer pt questions to the best of her ability.  Therapist notified [place] who was able to come and discuss pt concerns with her.  Pt requested that she be allowed to deal with pressing issues.  Pt missed remaining 42 mins due to pain and pt desire to address d/c concerns.  Therapy Documentation Precautions:  Precautions Precautions: Knee Precaution Comments: Bledsoe brace locked 15 degree flexion Required Braces or Orthoses: Other Brace Other Brace: L knee Bledsoe brace locked in 15 degrees flexion (ortho does not want knee extended fully) Restrictions Weight Bearing Restrictions: Yes LLE Weight Bearing: Non weight bearing Other Position/Activity Restrictions: LLE General: General OT Amount of Missed Time: 42 Minutes Vital Signs: Therapy Vitals Temp: (!) 97.4 F (36.3 C) Pulse Rate: 71 Resp: 16 BP: 131/78 Patient  Position (if appropriate): Lying Oxygen Therapy SpO2: 97 % O2 Device: Room Air Pain: Pain Assessment Pain Scale: 0-10 Pain Score: 7 Pain Intervention(s): Repositioned   Therapy/Group: Individual Therapy  Simonne Come 03/15/2020, 2:25 PM

## 2020-03-15 NOTE — Discharge Instructions (Signed)
Inpatient Rehab Discharge Instructions  Madine Sarr Discharge date and time: 03/15/20   Activities/Precautions/ Functional Status: Activity: Nonweightbearing on Left leg. Wear brace at all times.  Diet: Regular Wound Care: Routine skin checks. Keep incision clean and dry. Contact Dr. Doreatha Martin if you develop any problems with your incision/wound--redness, swelling, increase in pain, drainage or if you develop fever or chills.    ___ No restrictions     ___ Walk up steps independently _X__ 24/7 supervision/assistance   ___ Walk up steps with assistance ___ Intermittent supervision/assistance  ___ Bathe/dress independently ___ Walk with walker     _x__ Bathe/dress with assistance ___ Walk Independently    ___ Shower independently ___ Walk with assistance    ___ Shower with assistance _X__ No alcohol     ___ Return to work/school ________   Special Instructions: No driving, smoking or alcohol    COMMUNITY REFERRALS UPON DISCHARGE:    Home Health:   [facility]                                                 Agency/Supplier:[facility]   [phone]    My questions have been answered and I understand these instructions. I will adhere to these goals and the provided educational materials after my discharge from the hospital.  Patient/Caregiver Signature _______________________________ Date __________  Clinician Signature _______________________________________ Date __________  Please bring this form and your medication list with you to all your follow-up doctor's appointments.

## 2020-03-15 NOTE — Progress Notes (Signed)
Physical Therapy Session Note  Patient Details  Name: [name] MRN: [id number] Date of Birth: 1955/07/25  Today's Date: 03/15/2020 PT Individual Time: 0800-0856 PT Individual Time Calculation (min): 56 min      Short Term Goals: Week 1:  PT Short Term Goal 1 (Week 1): supine to/from sit w/supervision PT Short Term Goal 1 - Progress (Week 1): Met PT Short Term Goal 2 (Week 1): sliding board transfers w/level surfaces w/min assist of 1 PT Short Term Goal 2 - Progress (Week 1): Progressing toward goal PT Short Term Goal 3 (Week 1): sit to stand in parallel bars or w/bari walker w/mod assist NWBing LLE PT Short Term Goal 3 - Progress (Week 1): Met PT Short Term Goal 4 (Week 1): pt will maintain NWBing in standing w/LRAD x >1 min PT Short Term Goal 4 - Progress (Week 1): Met Week 2:  PT Short Term Goal 1 (Week 2): STG=LTG due to LOS  Skilled Therapeutic Interventions/Progress Updates:    Received pt supine in bed and very tearful and emotional regarding recent increase in pain along L anterior shin. Ultrasound scheduled for this afternoon to rule out blood clots. Therapist provided maximal emotional support and encouragement throughout session. Pt agreeable to therapy and reported pain 8/10 in LLE. RN present to administer pain medication and repositioning, rest breaks, and distraction done to reduce pain levels. Session with emphasis on discharge planning, functional mobility/transfers, generalized strengthening, dynamic standing balance/coordination, toileting, and improved activity tolerance. Pt continues to remain verbose and anxious and requires increased time with mobility. Therapist spoke with MD who reported pt does not have any therapy restrictions. Donned R sock using sock aide with set up assist and donned L Bledsoe brace total A with pt able to lift LLE using gait belt. Pt transferred supine<>sitting EOB mod I using gait belt and bedrails and donned R tennis shoe using shoe horn with  set up assist. Pt requested to have BM and transferred stand<>pivot bed<>bedside commode with RW and close supervision. Pt able to void and with large BM. Pt required total A for peri-care sitting on commode via anterior lean. Donned underwear sitting on commode with set up assist and transferred sit<>stand with supervision and required total A to pull brief over hips. Pt transferred bedside commode<>WC stand<>pivot with RW and supervision while maintaining LLE NWB precautions. Concluded session with pt sitting in [facility] with all needs within reach.   Precautions/Restrictions Precautions: Knee Precaution Comments: Bledsoe brace locked 15 degree flexion Required Braces or Orthoses: Other Brace Other Brace: L knee Bledsoe brace locked in 15 degrees flexion (ortho does not want knee extended fully) Restrictions Weight Bearing Restrictions: Yes LLE Weight Bearing: Non weight bearing   Therapy/Group: Individual Therapy Alfonse Alpers PT, DPT   03/15/2020, 8:11 AM

## 2020-03-15 NOTE — Progress Notes (Signed)
Occupational Therapy Discharge Summary  Patient Details  Name: [name] MRN: [id number] Date of Birth: 06-10-1955   Patient has met 6 of 6 long term goals due to improved activity tolerance, improved balance, postural control, ability to compensate for deficits and improved awareness.  Patient to discharge at overall Supervision level transfers, Min assist LB bathing and toileting.  Patient's care partner is independent to provide the necessary physical assistance at discharge.  Pt's husband present for family education and demonstrated good understanding of taught strategies and good communication throughout transfers and LB hygiene.  Reasons goals not met: N/A  Recommendation:  Patient will benefit from ongoing skilled OT services in home health setting to continue to advance functional skills in the area of BADL and Reduce care partner burden.  Equipment: bariatric bedside commode  Reasons for discharge: treatment goals met and discharge from hospital  Patient/family agrees with progress made and goals achieved: Yes  OT Discharge Precautions/Restrictions  Precautions Precautions: Knee Precaution Comments: Bledsoe brace locked 15 degree flexion Required Braces or Orthoses: Other Brace Other Brace: L knee Bledsoe brace locked in 15 degrees flexion (ortho does not want knee extended fully) Restrictions Weight Bearing Restrictions: Yes LLE Weight Bearing: Non weight bearing Pain Pain Assessment Pain Scale: 0-10 Pain Score: 8  Pain Location: Leg Pain Orientation: Left Pain Intervention(s): Medication (See eMAR) ADL ADL Equipment Provided: Reacher, Sock aid, Long-handled shoe horn, Leg lifter (shoe funnel) Eating: Independent Where Assessed-Eating: Wheelchair Grooming: Independent Where Assessed-Grooming: Sitting at sink, Wheelchair Upper Body Bathing: Setup Where Assessed-Upper Body Bathing: Sitting at sink, Bed level Lower Body Bathing: Minimal assistance Where  Assessed-Lower Body Bathing: Bed level Upper Body Dressing: Setup Where Assessed-Upper Body Dressing: Edge of bed Lower Body Dressing: Supervision/safety Where Assessed-Lower Body Dressing: Bed level Toileting: Minimal assistance Where Assessed-Toileting: Bedside Commode Toilet Transfer: Close supervision Toilet Transfer Method: Stand pivot Toilet Transfer Equipment: Bedside commode Vision Baseline Vision/History: Wears glasses Wears Glasses: At all times Patient Visual Report: No change from baseline Vision Assessment?: No apparent visual deficits Perception  Perception: Within Functional Limits Praxis Praxis: Intact Cognition Overall Cognitive Status: Within Functional Limits for tasks assessed Arousal/Alertness: Awake/alert Orientation Level: Oriented X4 Memory: Appears intact Awareness: Appears intact Problem Solving: Appears intact Safety/Judgment: Appears intact Sensation Sensation Light Touch: Impaired by gross assessment Proprioception: Appears Intact Additional Comments: pt reported "leathery" sensation along LLE comapred to RLE Coordination Gross Motor Movements are Fluid and Coordinated: No Fine Motor Movements are Fluid and Coordinated: Yes Coordination and Movement Description: mild uncoordination due to LLE NWB status, body habitus, decreased endurance, and generalized weakness Finger Nose Finger Test: [facility] bilaterally Heel Shin Test: WFL on LLE unable to perform on RLE due to [facility] brace Motor  Motor Motor: Within Functional Limits Motor - Skilled Clinical Observations: mild uncoordination due to LLE NWB status, body habitus, decreased endurance, and generalized weakness Mobility  Bed Mobility Bed Mobility: Rolling Right;Rolling Left;Supine to Sit;Sit to Supine Rolling Right: Independent with assistive device Rolling Left: Independent with assistive device Supine to Sit: Independent with assistive device Sit to Supine: Independent with assistive  device Transfers Sit to Stand: Supervision/Verbal cueing Stand to Sit: Supervision/Verbal cueing  Trunk/Postural Assessment  Cervical Assessment Cervical Assessment: Within Functional Limits Thoracic Assessment Thoracic Assessment: Within Functional Limits Lumbar Assessment Lumbar Assessment: Within Functional Limits Postural Control Postural Control: Within Functional Limits  Balance Balance Balance Assessed: Yes Static Sitting Balance Static Sitting - Balance Support: Feet supported;No upper extremity supported Static Sitting - Level of Assistance: 7: Independent  Dynamic Sitting Balance Dynamic Sitting - Balance Support: Feet supported;No upper extremity supported Dynamic Sitting - Level of Assistance: 7: Independent Static Standing Balance Static Standing - Balance Support: Bilateral upper extremity supported (RW) Static Standing - Level of Assistance: 5: Stand by assistance (supervision) Dynamic Standing Balance Dynamic Standing - Balance Support: Bilateral upper extremity supported (RW) Dynamic Standing - Level of Assistance: 5: Stand by assistance (supervision) Extremity/Trunk Assessment RUE Assessment RUE Assessment: Within Functional Limits General Strength Comments: 5/5 LUE Assessment LUE Assessment: Within Functional Limits General Strength Comments: 5/5   Jp [place], [place] 03/15/2020, 10:24 AM

## 2020-03-15 NOTE — Progress Notes (Addendum)
[facility] PHYSICAL MEDICINE & REHABILITATION PROGRESS NOTE   Subjective/Complaints: Patient seen laying in bed this morning.  She states she slept better overnight.  She notes some improvement in pain, but still persistent.  She is tearful.  Discussed therapies with therapist given acute medical changes.  ROS: Left lower extremity pain.  Denies CP, SOB, N/V/D  Objective:   DG Tibia/Fibula Left Port  Result Date: 03/14/2020 CLINICAL DATA:  64 year old female with knee pain. EXAM: PORTABLE LEFT TIBIA AND FIBULA - 2 VIEW COMPARISON:  Tibia fibula and knee radiographs from 02/26/2020 and FINDINGS: No acute fracture or malalignment. Similar appearance of the medial proximal tibial plate and screw apparatus and the partially visualized distal femoral nail without evidence of new hardware fracture, loosening, or malalignment. Normal bony mineralization. Mild calcaneal plantar enthesopathy. Diffuse lower extremity soft tissue edema and scattered phleboliths. IMPRESSION: No acute fracture or malalignment. Similar appearance of medial proximal tibial plate and screw apparatus after ORIF. Electronically Signed   By: Ruthann Cancer MD   On: 03/14/2020 09:19   No results for input(s): WBC, HGB, HCT, PLT in the last 72 hours. Recent Labs    03/14/20 0353 03/15/20 0444  NA 137  --   K 3.8  --   CL 103  --   CO2 22  --   GLUCOSE 93  --   BUN 9  --   CREATININE 0.54 0.63  CALCIUM 8.8*  --     Intake/Output Summary (Last 24 hours) at 03/15/2020 1102 Last data filed at 03/15/2020 0800 Gross per 24 hour  Intake 996 ml  Output 1750 ml  Net -754 ml    Physical Exam: Vital Signs Blood pressure (!) 122/57, pulse 67, temperature 97.7 F (36.5 C), temperature source Oral, resp. rate 20, height 5\' 8"  (1.727 m), weight (!) 143.2 kg, SpO2 98 %. Constitutional: No distress . Vital signs reviewed.  Morbidly obese. HENT: Normocephalic.  Atraumatic. Eyes: EOMI. No discharge. Cardiovascular: No JVD.   RRR. Respiratory: Normal effort.  No stridor.  Bilateral clear to auscultation. GI: Non-distended.  BS +. Skin: Warm and dry.  Erythema in toes of left lower extremity Left knee incision CDI Psych: Anxious.  Tearful. Musc: Left lower extremity with edema and tenderness. Neuro:  Alert Sensation diminished to light touch dorsal foot Hyperalgesia over left tibia Motor: Bilateral upper extremities: 5/5 proximal distal Right lower extremity: 4+/5 proximal distal Left lower extremity: Previously-hip flexion 2+/5 (some pain inhibition), knee brace, ankle dorsiflexion 4+/5; unwilling to move today due to pain  Assessment/Plan: 1. Functional deficits which require 3+ hours per day of interdisciplinary therapy in a comprehensive inpatient rehab setting.  Physiatrist is providing close team supervision and 24 hour management of active medical problems listed below.  Physiatrist and rehab team continue to assess barriers to discharge/monitor patient progress toward functional and medical goals  Care Tool:  Bathing    Body parts bathed by patient: Right arm, Left arm, Chest, Abdomen, Front perineal area, Right upper leg, Left upper leg, Face   Body parts bathed by helper: Buttocks, Left lower leg     Bathing assist Assist Level: Minimal Assistance - Patient > 75%     Upper Body Dressing/Undressing Upper body dressing   What is the patient wearing?: Dress    Upper body assist Assist Level: Set up assist    Lower Body Dressing/Undressing Lower body dressing    Lower body dressing activity did not occur:  (Pt had just completed hygiene with NT prior  to OT arrival.) What is the patient wearing?: Underwear/pull up     Lower body assist Assist for lower body dressing: Supervision/Verbal cueing     Toileting Toileting    Toileting assist Assist for toileting: Minimal Assistance - Patient > 75%     Transfers Chair/bed transfer  Transfers assist     Chair/bed transfer assist  level: Supervision/Verbal cueing     Locomotion Ambulation   Ambulation assist   Ambulation activity did not occur: Safety/medical concerns (LLE NWB status, body habitus, generalized weakness, and poor endurance)          Walk 10 feet activity   Assist  Walk 10 feet activity did not occur: Safety/medical concerns (gait belt and use of bedrails)        Walk 50 feet activity   Assist Walk 50 feet with 2 turns activity did not occur: Safety/medical concerns (gait belt and use of bedrails)         Walk 150 feet activity   Assist Walk 150 feet activity did not occur: Safety/medical concerns (gait belt and use of bedrails)         Walk 10 feet on uneven surface  activity   Assist Walk 10 feet on uneven surfaces activity did not occur: Safety/medical concerns (gait belt and use of bedrails)         Wheelchair     Assist Will patient use wheelchair at discharge?: Yes Type of Wheelchair: Manual Wheelchair activity did not occur: Safety/medical concerns  Wheelchair assist level: Supervision/Verbal cueing Max wheelchair distance: 173ft    Wheelchair 50 feet with 2 turns activity    Assist    Wheelchair 50 feet with 2 turns activity did not occur: Safety/medical concerns   Assist Level: Supervision/Verbal cueing   Wheelchair 150 feet activity     Assist  Wheelchair 150 feet activity did not occur: Safety/medical concerns   Assist Level: Supervision/Verbal cueing   Blood pressure (!) 122/57, pulse 67, temperature 97.7 F (36.5 C), temperature source Oral, resp. rate 20, height 5\' 8"  (1.727 m), weight (!) 143.2 kg, SpO2 98 %.  Medical Problem List and Plan: 1.  Decreased functional mobility secondary to left tibial plateau fracture.  Status post ORIF 02/25/2020.  Nonweightbearing left lower extremity with hinged knee brace l during ambulation.    Continue CIR  Knee x-ray showing postoperative changes, plan for repeat films personally  reviewed, stable 2.  Antithrombotics: -DVT/anticoagulation: Lovenox.    Creatinine within normal limits on 11/26  Vascular studies reviewed, negative for DVT             -antiplatelet therapy: N/A 3. Pain Management:    Oxycodone and Robaxin as needed  Increased gabapentin to home dose 900 mg BID, DC'd 11/26  Lyrica 100 3 times daily on 11/26  Skelaxin TID  Lidoderm patch added on 11/16  Encouraged ice after each therapy session to left knee             ?  CRPS-will await Ortho evaluation and consider treatment unremarkable  Monitor with increased exertion 4. Mood: Provide emotional support  Klonopin 0.25 changed to nightly plus additional dose as needed at night, with improvement  Klonopin 0.25 mg 3 times daily scheduled on 11/26             -antipsychotic agents: N/A 5. Neuropsych: This patient is capable of making decisions on her own behalf. 6. Skin/Wound Care: Routine skin checks 7. Fluids/Electrolytes/Nutrition: Routine in and outs.   8.  Acute  blood loss anemia: Resolved.               Hb 13.3 on 11/22  Cont to monitor  9.  Hypertension.  Norvasc 5 mg daily.  Vitals:   03/14/20 1927 03/15/20 0333  BP: (!) 131/52 (!) 122/57  Pulse: 78 67  Resp: 20 20  Temp: 97.7 F (36.5 C) 97.7 F (36.5 C)  SpO2: 99% 98%   Controlled on 11/26 10.  History of alcohol tobacco use.  Provide counseling 11.  Morbid obesity.  BMI 48.00 Encourage weight loss.  Dietary follow-up 12. Pruritis  Benadryl 25 mg q6 hours prn, added increased dose at bedtime nightly as needed  Discussed with nursing patient using dressings under brace at night to absorb perspiration  Large anxiety component  Improved 13. Hyponatremia  Na+ 137 on 11/25  Cont to monitor 14. Hypoalbuminemia  Supplement initiated on 11/15 15.  Drug-induced constipation  Bowel meds increased on 11/16, DC'd on 11/17  Improving 16.  Urinary urgency-baseline  PVRs relatively unremarkable   Enablex 7.5 qhs started on 11/20,  increased on 11/24, DC'd on 11/25 due to?  Temporal relationship with pain (> 1% incidence)  Stable 17.  Sleep disturbance  See #4, #16  Home melatonin restarted on 11/8  Improving 18. CRPS  Discussed with Ortho-ultrasound ordered to rule out DVT, pending  X-ray reviewed, unremarkable  See #3, #16  Prednisone 2-week taper started on 11/26   LOS: 14 days A FACE TO FACE EVALUATION WAS PERFORMED  Jin Capote Lorie Phenix 03/15/2020, 11:02 AM

## 2020-03-16 NOTE — Plan of Care (Signed)
Patient is alert and oriented x4. Got up to [facility] with 2 person assist, walker, and knee immobilizer. BM this shift. Patient received oxycodone for pain, needed prn benadryl since gets pruritus from oxy. Prn Clonopin for anxiety. Patient tearful and worried about husband who is admitted in the hospital. Charge Nurse assisted in ensuring that patient was able to reach her husband. NT took phone charger to patient's husband. Patient currently in bed asleep. Problem: Consults Goal: RH GENERAL PATIENT EDUCATION Description: See Patient Education module for education specifics. Outcome: Progressing Goal: Skin Care Protocol Initiated - if Braden Score 18 or less Description: If consults are not indicated, leave blank or document N/A Outcome: Progressing Goal: Nutrition Consult-if indicated Outcome: Progressing Goal: Diabetes Guidelines if Diabetic/Glucose > 140 Description: If diabetic or lab glucose is > 140 mg/dl - Initiate Diabetes/Hyperglycemia Guidelines & Document Interventions  Outcome: Progressing   Problem: RH BOWEL ELIMINATION Goal: RH STG MANAGE BOWEL WITH ASSISTANCE Description: STG Manage Bowel with min Assistance. Outcome: Progressing Goal: RH STG MANAGE BOWEL W/MEDICATION W/ASSISTANCE Description: STG Manage Bowel with Medication with min Assistance. Outcome: Progressing   Problem: RH BLADDER ELIMINATION Goal: RH STG MANAGE BLADDER WITH ASSISTANCE Description: STG Manage Bladder With min Assistance Outcome: Progressing Goal: RH STG MANAGE BLADDER WITH MEDICATION WITH ASSISTANCE Description: STG Manage Bladder With Medication With min Assistance. Outcome: Progressing Goal: RH STG MANAGE BLADDER WITH EQUIPMENT WITH ASSISTANCE Description: STG Manage Bladder With Equipment With min Assistance Outcome: Progressing   Problem: RH SKIN INTEGRITY Goal: RH STG SKIN FREE OF INFECTION/BREAKDOWN Description: No new skin breakdown this admission with supervision assist Outcome:  Progressing Goal: RH STG MAINTAIN SKIN INTEGRITY WITH ASSISTANCE Description: STG Maintain Skin Integrity With min Assistance. Outcome: Progressing Goal: RH STG ABLE TO PERFORM INCISION/WOUND CARE W/ASSISTANCE Description: STG Able To Perform Incision/Wound Care With min Assistance. Outcome: Progressing   Problem: RH SAFETY Goal: RH STG ADHERE TO SAFETY PRECAUTIONS W/ASSISTANCE/DEVICE Description: STG Adhere to Safety Precautions With supervision Assistance/Device. Outcome: Progressing Goal: RH STG DECREASED RISK OF FALL WITH ASSISTANCE Description: STG Decreased Risk of Fall With supervision Assistance. Outcome: Progressing   Problem: RH PAIN MANAGEMENT Goal: RH STG PAIN MANAGED AT OR BELOW PT'S PAIN GOAL Description: Less than 3 out of 10 Outcome: Progressing   Problem: RH KNOWLEDGE DEFICIT GENERAL Goal: RH STG INCREASE KNOWLEDGE OF SELF CARE AFTER HOSPITALIZATION Description: Pt and family will have understanding of post hospital care including medications follow up care and wound care with supervision verbal cues  Outcome: Progressing   Problem: RH Vision Goal: RH LTG Vision (Specify) Outcome: Progressing   Problem: RH Pre-functional/Other (Specify) Goal: RH LTG Pre-functional (Specify) Outcome: Progressing Goal: RH LTG Interdisciplinary (Specify) 1 Description: RH LTG Interdisciplinary (Specify)1 Outcome: Progressing Goal: RH LTG Interdisciplinary (Specify) 2 Description: RH LTG Interdisciplinary (Specify) 2  Outcome: Progressing

## 2020-03-16 NOTE — Progress Notes (Signed)
Voice message left for [name] social work regarding patient need for bariatric walker at home. No response at this time. This nurse spoke with MD Posey Pronto and Simonne Come OT regarding pt equipment need. Patient to go home with rehab walker temporarily until ordering process can be completed.  Molson [employer] RN

## 2020-03-16 NOTE — Plan of Care (Signed)
Problem: Consults Goal: RH GENERAL PATIENT EDUCATION Description: See Patient Education module for education specifics. Outcome: Completed/Met Goal: Skin Care Protocol Initiated - if Braden Score 18 or less Description: If consults are not indicated, leave blank or document N/A Outcome: Completed/Met Goal: Nutrition Consult-if indicated Outcome: Completed/Met Goal: Diabetes Guidelines if Diabetic/Glucose > 140 Description: If diabetic or lab glucose is > 140 mg/dl - Initiate Diabetes/Hyperglycemia Guidelines & Document Interventions  Outcome: Completed/Met   Problem: RH BOWEL ELIMINATION Goal: RH STG MANAGE BOWEL WITH ASSISTANCE Description: STG Manage Bowel with min Assistance. Outcome: Completed/Met Goal: RH STG MANAGE BOWEL W/MEDICATION W/ASSISTANCE Description: STG Manage Bowel with Medication with min Assistance. Outcome: Completed/Met   Problem: RH BLADDER ELIMINATION Goal: RH STG MANAGE BLADDER WITH ASSISTANCE Description: STG Manage Bladder With min Assistance Outcome: Completed/Met Goal: RH STG MANAGE BLADDER WITH MEDICATION WITH ASSISTANCE Description: STG Manage Bladder With Medication With min Assistance. Outcome: Completed/Met Goal: RH STG MANAGE BLADDER WITH EQUIPMENT WITH ASSISTANCE Description: STG Manage Bladder With Equipment With min Assistance Outcome: Completed/Met   Problem: RH SKIN INTEGRITY Goal: RH STG SKIN FREE OF INFECTION/BREAKDOWN Description: No new skin breakdown this admission with supervision assist Outcome: Completed/Met Goal: RH STG MAINTAIN SKIN INTEGRITY WITH ASSISTANCE Description: STG Maintain Skin Integrity With min Assistance. Outcome: Completed/Met Goal: RH STG ABLE TO PERFORM INCISION/WOUND CARE W/ASSISTANCE Description: STG Able To Perform Incision/Wound Care With min Assistance. Outcome: Completed/Met   Problem: RH SAFETY Goal: RH STG ADHERE TO SAFETY PRECAUTIONS W/ASSISTANCE/DEVICE Description: STG Adhere to Safety  Precautions With supervision Assistance/Device. Outcome: Completed/Met Goal: RH STG DECREASED RISK OF FALL WITH ASSISTANCE Description: STG Decreased Risk of Fall With supervision Assistance. Outcome: Completed/Met   Problem: RH PAIN MANAGEMENT Goal: RH STG PAIN MANAGED AT OR BELOW PT'S PAIN GOAL Description: Less than 3 out of 10 Outcome: Completed/Met   Problem: RH KNOWLEDGE DEFICIT GENERAL Goal: RH STG INCREASE KNOWLEDGE OF SELF CARE AFTER HOSPITALIZATION Description: Pt and family will have understanding of post hospital care including medications follow up care and wound care with supervision verbal cues  Outcome: Completed/Met   Problem: RH Vision Goal: RH LTG Vision (Specify) Outcome: Completed/Met   Problem: RH Pre-functional/Other (Specify) Goal: RH LTG Pre-functional (Specify) Outcome: Completed/Met Goal: RH LTG Interdisciplinary (Specify) 1 Description: RH LTG Interdisciplinary (Specify)1 Outcome: Completed/Met Goal: RH LTG Interdisciplinary (Specify) 2 Description: RH LTG Interdisciplinary (Specify) 2  Outcome: Completed/Met

## 2020-03-16 NOTE — Progress Notes (Addendum)
[facility] PHYSICAL MEDICINE & REHABILITATION PROGRESS NOTE   Subjective/Complaints: Patient seen sitting up in bed this morning.  She states she slept fairly overnight because her husband was admitted to the hospital and she was concerned about him.  She is questions regarding DME as well as medications.  She is ready for discharge.  She states her daughter will be staying with her.  ROS: Denies CP, SOB, N/V/D  Objective:   VAS [place] LOWER EXTREMITY VENOUS (DVT)  Result Date: 03/15/2020  Lower Venous DVT Study Indications: Post op pain & swelling. Other Indications: OPEN REDUCTION INTERNAL FIXATION (ORIF) TIBIAL PLATEAU                    02/26/20. Limitations: Body habitus, poor ultrasound/tissue interface, bandages and patient pain tolerance. Comparison Study: 03/03/20 previous Performing Technologist: Abram Sander RVS  Examination Guidelines: A complete evaluation includes B-mode imaging, spectral Doppler, color Doppler, and power Doppler as needed of all accessible portions of each vessel. Bilateral testing is considered an integral part of a complete examination. Limited examinations for reoccurring indications may be performed as noted. The reflux portion of the exam is performed with the patient in reverse Trendelenburg.  +-----+---------------+---------+-----------+----------+--------------+ RIGHTCompressibilityPhasicitySpontaneityPropertiesThrombus Aging +-----+---------------+---------+-----------+----------+--------------+ CFV  Full           Yes      Yes                                 +-----+---------------+---------+-----------+----------+--------------+   +---------+---------------+---------+-----------+----------+-------------------+ LEFT     CompressibilityPhasicitySpontaneityPropertiesThrombus Aging      +---------+---------------+---------+-----------+----------+-------------------+ CFV      Full           Yes      Yes                                       +---------+---------------+---------+-----------+----------+-------------------+ SFJ      Full                                                             +---------+---------------+---------+-----------+----------+-------------------+ FV Prox  Full                                                             +---------+---------------+---------+-----------+----------+-------------------+ FV Mid                  Yes      Yes                                      +---------+---------------+---------+-----------+----------+-------------------+ FV Distal               Yes      Yes                                      +---------+---------------+---------+-----------+----------+-------------------+  PFV      Full                                                             +---------+---------------+---------+-----------+----------+-------------------+ POP      Full           Yes      Yes                  Not well visualized +---------+---------------+---------+-----------+----------+-------------------+ PTV                                                   Not well visualized +---------+---------------+---------+-----------+----------+-------------------+ PERO                                                  Not well visualized +---------+---------------+---------+-----------+----------+-------------------+     Summary: RIGHT: - No evidence of common femoral vein obstruction.  LEFT: - Findings appear essentially unchanged compared to previous examination. - There is no evidence of deep vein thrombosis in the lower extremity. However, portions of this examination were limited- see technologist comments above.  - No cystic structure found in the popliteal fossa. - Due to pain tolerance & limited visualization, this exam is inconclusive.  *See table(s) above for measurements and observations. Electronically signed by Jamelle Haring on 03/15/2020 at 2:57:50 PM.    Final     No results for input(s): WBC, HGB, HCT, PLT in the last 72 hours. Recent Labs    03/14/20 0353 03/15/20 0444  NA 137  --   K 3.8  --   CL 103  --   CO2 22  --   GLUCOSE 93  --   BUN 9  --   CREATININE 0.54 0.63  CALCIUM 8.8*  --     Intake/Output Summary (Last 24 hours) at 03/16/2020 1149 Last data filed at 03/16/2020 0254 Gross per 24 hour  Intake 720 ml  Output 1550 ml  Net -830 ml    Physical Exam: Vital Signs Blood pressure (!) 141/58, pulse 62, temperature 97.7 F (36.5 C), temperature source Oral, resp. rate 16, height 5\' 8"  (1.727 m), weight (!) 143.2 kg, SpO2 100 %. Constitutional: No distress . Vital signs reviewed.  Morbidly obese. HENT: Normocephalic.  Atraumatic. Eyes: EOMI. No discharge. Cardiovascular: No JVD.  RRR. Respiratory: Normal effort.  No stridor.  Bilateral clear to auscultation. GI: Non-distended.  BS +. Skin: Warm and dry.  Erythema in toes of left lower extremity improving. Left knee incision CDI Psych: Anxious. Musc: Left lower extremity with edema and tenderness to minimal touch. Neuro:  Alert Sensation diminished to light touch dorsal foot Motor: Bilateral upper extremities: 5/5 proximal distal Right lower extremity: 4+/5 proximal distal, unchanged Left lower extremity: Previously-hip flexion 2+/5 (some pain inhibition), knee brace, ankle dorsiflexion 4+/5; unwilling to move today due to pain  Assessment/Plan: 1. Functional deficits which require 3+ hours per day of interdisciplinary therapy in a comprehensive inpatient rehab setting.  Physiatrist is providing close team supervision and 24 hour  management of active medical problems listed below.  Physiatrist and rehab team continue to assess barriers to discharge/monitor patient progress toward functional and medical goals  Care Tool:  Bathing    Body parts bathed by patient: Right arm, Left arm, Chest, Abdomen, Front perineal area, Right upper leg, Left upper leg, Face   Body  parts bathed by helper: Buttocks, Left lower leg     Bathing assist Assist Level: Minimal Assistance - Patient > 75%     Upper Body Dressing/Undressing Upper body dressing   What is the patient wearing?: Dress    Upper body assist Assist Level: Set up assist    Lower Body Dressing/Undressing Lower body dressing    Lower body dressing activity did not occur:  (Pt had just completed hygiene with NT prior to OT arrival.) What is the patient wearing?: Underwear/pull up     Lower body assist Assist for lower body dressing: Supervision/Verbal cueing     Toileting Toileting    Toileting assist Assist for toileting: Minimal Assistance - Patient > 75%     Transfers Chair/bed transfer  Transfers assist     Chair/bed transfer assist level: Supervision/Verbal cueing     Locomotion Ambulation   Ambulation assist   Ambulation activity did not occur: Safety/medical concerns (LLE NWB status, body habitus, generalized weakness, and poor endurance)          Walk 10 feet activity   Assist  Walk 10 feet activity did not occur: Safety/medical concerns (LLE NWB status, body habitus, generalized weakness, and poor endurance)        Walk 50 feet activity   Assist Walk 50 feet with 2 turns activity did not occur: Safety/medical concerns (LLE NWB status, body habitus, generalized weakness, and poor endurance)         Walk 150 feet activity   Assist Walk 150 feet activity did not occur: Safety/medical concerns (LLE NWB status, body habitus, generalized weakness, and poor endurance)         Walk 10 feet on uneven surface  activity   Assist Walk 10 feet on uneven surfaces activity did not occur: Safety/medical concerns (LLE NWB status, body habitus, generalized weakness, and poor endurance)         Wheelchair     Assist Will patient use wheelchair at discharge?: Yes Type of Wheelchair: Manual Wheelchair activity did not occur: Safety/medical  concerns  Wheelchair assist level: Independent Max wheelchair distance: 161ft    Wheelchair 50 feet with 2 turns activity    Assist    Wheelchair 50 feet with 2 turns activity did not occur: Safety/medical concerns   Assist Level: Independent   Wheelchair 150 feet activity     Assist  Wheelchair 150 feet activity did not occur: Safety/medical concerns   Assist Level: Independent   Medical Problem List and Plan: 1.  Decreased functional mobility secondary to left tibial plateau fracture.  Status post ORIF 02/25/2020.  Nonweightbearing left lower extremity with hinged knee brace l during ambulation.    DC today  Will see patient for transitional care management in 1-2 weeks post-discharge  Knee x-ray showing postoperative changes, plan for repeat films personally reviewed, stable 2.  Antithrombotics: -DVT/anticoagulation: Lovenox.    Creatinine within normal limits on 11/26  Vascular studies reviewed, negative for DVT             -antiplatelet therapy: N/A 3. Pain Management:    Oxycodone and Robaxin as needed  Increased gabapentin to home dose 900 mg BID,  DC'd 11/26  Lyrica 100 3 times daily on 11/26  Skelaxin TID  Lidoderm patch added on 11/16  Encouraged ice after each therapy session to left knee             ?  CRPS-will await Ortho evaluation and consider treatment unremarkable  Improving control on 11/27  Monitor with increased exertion 4. Mood: Provide emotional support  Klonopin 0.25 changed to nightly plus additional dose as needed at night, with improvement  Klonopin 0.25 mg 3 times daily scheduled on 11/26             -antipsychotic agents: N/A 5. Neuropsych: This patient is capable of making decisions on her own behalf. 6. Skin/Wound Care: Routine skin checks 7. Fluids/Electrolytes/Nutrition: Routine in and outs.   8.  Acute blood loss anemia: Resolved.               Hb 13.3 on 11/22  Cont to monitor  9.  Hypertension.  Norvasc 5 mg daily.  Vitals:    03/15/20 1341 03/16/20 0512  BP: 131/78 (!) 141/58  Pulse: 71 62  Resp: 16 16  Temp: (!) 97.4 F (36.3 C) 97.7 F (36.5 C)  SpO2: 97% 100%   Relatively controlled on 11/27 10.  History of alcohol tobacco use.  Provide counseling 11.  Morbid obesity.  BMI 48.00 Encourage weight loss.  Dietary follow-up 12. Pruritis  Benadryl 25 mg q6 hours prn, added increased dose at bedtime nightly as needed  Discussed with nursing patient using dressings under brace at night to absorb perspiration  Large anxiety component  Improved 13. Hyponatremia  Na+ 137 on 11/25  Cont to monitor 14. Hypoalbuminemia  Supplement initiated on 11/15 15.  Drug-induced constipation  Bowel meds increased on 11/16, DC'd on 11/17  Improving 16.  Urinary urgency-baseline  PVRs relatively unremarkable   Enablex 7.5 qhs started on 11/20, increased on 11/24, DC'd on 11/25 due to?  Temporal relationship with pain (> 1% incidence)  Stable 17.  Sleep disturbance  See #4, #16  Home melatonin restarted on 11/8  Improved overall 18. CRPS  Discussed with Ortho-ultrasound ordered to rule out DVT, pending  X-ray reviewed, unremarkable  See #3, #16  Prednisone 2-week taper started on 11/26   > 30 minutes spent in total in discharge planning between myself and PA regarding aforementioned, as well discussion regarding DME equipment, follow-up appointments, follow-up therapies, discharge medications-and occluding reviewing different costs associated with pharmacies, discharge recommendations.  Regarding DME-patient will loan hospital bariatric walker as she does not have a safe walker for discharge.  Discussed with therapies as well as communicated with therapy director.  Will discuss with social worker regarding ordering a bariatric walker for patient.  LOS: 15 days A FACE TO FACE EVALUATION WAS PERFORMED  Alice Vitelli Lorie Phenix 03/16/2020, 11:49 AM

## 2020-03-16 NOTE — Progress Notes (Signed)
Patient D/C home with daughter. All patient belongings sent with patient. Patient took IP Rehab walker home due to personal walker not yet being delivered. Patient with exchange walkers once hers arrives

## 2020-03-18 ENCOUNTER — Telehealth: Payer: Self-pay

## 2020-03-18 NOTE — Progress Notes (Signed)
Patient ID: [name], female   DOB: [birth year], 64 y.o.   MRN: [id number]  Covering for primary sw, Becky.  Sw received notification that patient discharged with CIR bari RW, needing one for personal use. SW ordering bari rw and will contact patient.   [place], [place]

## 2020-03-18 NOTE — Telephone Encounter (Addendum)
Transitional Care Call--who you spoke with patient [name]   1. Are you/is patient experiencing any problems since coming home? No problems.  Are there any questions regarding any aspect of care? None.  2. Are there any questions regarding medications administration/dosing? None. Are meds being taken as prescribed? Yes. Patient should review meds with caller to confirm. Medication reviewed.  3. Have there been any falls? None.  4. Has Home Health been to the house and/or have they contacted you? No. If not, have you tried to contact them? Patient does not know who to contact.  Can we help you contact them? Yes. I will contact the discharge Social Worker and let them know. She will check with her husband also.  5. Are bowels and bladder emptying properly?No problem.  Are there any unexpected incontinence issues? No. f applicable, is patient following bowel/bladder programs? N/A 6. Any fevers, problems with breathing, unexpected pain? None. 7. Are there any skin problems or new areas of breakdown? None.  8. Has the patient/family member arranged specialty MD follow up (ie cardiology/neurology/renal/surgical/etc)?  All follow up information given to [name].  Can we help arrange? No problem.  9. Does the patient need any other services or support that we can help arrange? Just Home Health I  will contact the hospital Social Worker.  10. Are caregivers following through as expected in assisting the patient? In progress.        11. Has the patient quit smoking, drinking alcohol, or using drugs as recommended? She does  vapes but does not use drugs and not drinking alcohol.   Appointment Date/Time/ Arrival time/ and who they are seeing [place]

## 2020-03-18 NOTE — Telephone Encounter (Signed)
Mrs. Bacorn was discharged on 03/16/2020 she does not know who her [facility] or PT agency is. Can you help her?  Call back ph # [phone].

## 2020-03-21 NOTE — Progress Notes (Signed)
Inpatient Rehabilitation Care Coordinator  Discharge Note  The overall goal for the admission was met for: DC Sat 11/27 Discharge location: Yes-HOME WITH HUSBAND WHO CAN PROVIDE ASSIST  Length of Stay: Yes-15 DAYS  Discharge activity level: Yes-SUPERVISION-MIN LEVEL WHEELCHAIR LEVEL  Home/community participation: Yes  Services provided included: MD, RD, PT, OT, RN, CM, Pharmacy, Neuropsych and SW  Financial Services: Private Insurance: BCBS  Follow-up services arranged: Home Health: [facility]-PT, DME: [facility]-WHEELCHAIR AND [facility] and Patient/Family has no preference for HH/DME agencies  Comments (or additional information):HUSBAND WAS IN FOR EDUCATION AND FEELS COMFORTABLE WITH WIFE'S CARE  Patient/Family verbalized understanding of follow-up arrangements: Yes  Individual responsible for coordination of the follow-up plan: SELF [phone]  Confirmed correct DME delivered: ,  G 03/21/2020    ,  G 

## 2020-03-22 ENCOUNTER — Telehealth: Payer: Self-pay | Admitting: *Deleted

## 2020-03-22 NOTE — Telephone Encounter (Signed)
Timmothy Sours OT  [facility] called for POC 1wk3.  Approval given.

## 2020-03-26 ENCOUNTER — Encounter: Payer: BC Managed Care – PPO | Admitting: Registered Nurse

## 2020-03-28 DIAGNOSIS — I10 Essential (primary) hypertension: Secondary | ICD-10-CM

## 2020-03-28 DIAGNOSIS — S82142D Displaced bicondylar fracture of left tibia, subsequent encounter for closed fracture with routine healing: Secondary | ICD-10-CM

## 2020-03-28 DIAGNOSIS — D62 Acute posthemorrhagic anemia: Secondary | ICD-10-CM

## 2020-03-28 DIAGNOSIS — F319 Bipolar disorder, unspecified: Secondary | ICD-10-CM

## 2020-03-28 DIAGNOSIS — Z9181 History of falling: Secondary | ICD-10-CM

## 2020-03-28 DIAGNOSIS — M1712 Unilateral primary osteoarthritis, left knee: Secondary | ICD-10-CM

## 2020-03-28 DIAGNOSIS — F419 Anxiety disorder, unspecified: Secondary | ICD-10-CM

## 2020-03-28 DIAGNOSIS — G4700 Insomnia, unspecified: Secondary | ICD-10-CM

## 2020-03-28 DIAGNOSIS — Z87891 Personal history of nicotine dependence: Secondary | ICD-10-CM

## 2020-03-28 DIAGNOSIS — Z6841 Body Mass Index (BMI) 40.0 and over, adult: Secondary | ICD-10-CM

## 2020-03-28 DIAGNOSIS — K5903 Drug induced constipation: Secondary | ICD-10-CM

## 2020-03-29 ENCOUNTER — Telehealth: Payer: Self-pay

## 2020-03-29 MED ORDER — OXYCODONE HCL 10 MG PO TABS: 10.0000 mg | ORAL_TABLET | Freq: Four times a day (QID) | ORAL | 0 refills | Status: DC | PRN

## 2020-03-29 MED ORDER — TRAMADOL HCL 50 MG PO TABS: 50.0000 mg | ORAL_TABLET | Freq: Four times a day (QID) | ORAL | 0 refills | Status: DC | PRN

## 2020-03-29 NOTE — Telephone Encounter (Signed)
PMP was Reviewed: Discharge Summary was reviewed.  Placed a call to [name] she reports she was taking her Oxycodone and Tramadol one tablet every 4 hours alternating the medication. She was instructed to take medication every 6 hours as needed for pain. She has a scheduled appointment on 04/03/24, she was given enough medication until her scheduled appointment. She verbalizes understanding. Also educated on the [employer] warning of using opioids and benzodiazepines. I highlighted the dangers of using these drugs together and discussed the adverse events including respiratory suppression, overdose, cognitive impairment and importance of compliance with current regimen. We will continue to monitor and adjust as indicated.  She verbalizes understanding.

## 2020-03-29 NOTE — Telephone Encounter (Signed)
[name]  is out of Tramadol & Oxycodone. And is requesting refills.  She missed the first  TC appointment because of no wheelchair. (TC appointment has been rescheduled).   Per patient her PCP stated she can not prescribe the medications.   Call back ph# [phone].

## 2020-04-02 ENCOUNTER — Telehealth: Payer: Self-pay | Admitting: *Deleted

## 2020-04-02 NOTE — Telephone Encounter (Signed)
[name] called and she is having [employer] delivering her FMLA papers and would like them completed asap if possible.  They should be delivered within the hour.

## 2020-04-03 ENCOUNTER — Other Ambulatory Visit: Payer: Self-pay

## 2020-04-03 ENCOUNTER — Encounter: Payer: BC Managed Care – PPO | Attending: Registered Nurse | Admitting: Registered Nurse

## 2020-04-03 ENCOUNTER — Encounter: Payer: Self-pay | Admitting: Registered Nurse

## 2020-04-03 VITALS — BP 136/84 | Ht 68.0 in | Wt 315.7 lb

## 2020-04-03 DIAGNOSIS — S82142D Displaced bicondylar fracture of left tibia, subsequent encounter for closed fracture with routine healing: Secondary | ICD-10-CM | POA: Diagnosis not present

## 2020-04-03 DIAGNOSIS — I10 Essential (primary) hypertension: Secondary | ICD-10-CM | POA: Diagnosis not present

## 2020-04-03 DIAGNOSIS — F411 Generalized anxiety disorder: Secondary | ICD-10-CM | POA: Diagnosis present

## 2020-04-03 DIAGNOSIS — G8918 Other acute postprocedural pain: Secondary | ICD-10-CM | POA: Insufficient documentation

## 2020-04-03 MED ORDER — PREGABALIN 100 MG PO CAPS: 100.0000 mg | ORAL_CAPSULE | Freq: Three times a day (TID) | ORAL | 0 refills | Status: DC

## 2020-04-03 MED ORDER — METHOCARBAMOL 500 MG PO TABS: 500.0000 mg | ORAL_TABLET | Freq: Four times a day (QID) | ORAL | 0 refills | Status: DC | PRN

## 2020-04-03 NOTE — Progress Notes (Addendum)
Subjective:    Patient ID: [name], female    DOB: 1956-02-21, 64 y.o.   MRN: [id number]  HPI: [name] is a 64 y.o. female whose appointment was changed to a My-Chart Visit due to [name] reports she was awaiting her wheelchair, she also stated her husband is in quarantine do to feeling ill. She was instructed to call her husband PCP and to obtain COVID testing, she verbalizes understanding. Also states she will call her husband physician. [name] was having problem accessing My-Chart visit changed to a telephone visit, she agrees with telephone visit and verbalizes understanding.   [name]  Presented to [name] on 02/24/2020 via EMS for evaluation of left leg pain, after a fall.  Orthopedics consulted.  DG Left Knee:  IMPRESSION: 1. Acute, depressed medial tibial plateau fracture. 2. Mild tricompartmental degenerative changes are noted. CT Left Knee WO Contrast:  IMPRESSION: 1. Acute, comminuted, intra-articular fracture involving the medial tibial plateau with mild depression of the anterior fracture fragments. 2. Signs of lateral collateral ligament avulsion injury is noted with small osseous fragments adjacent to the lateral femoral condyle and lateral tibial plateau. 3. Nondisplaced fracture of the styloid process of the fibula is suspected which may be related to lateral collateral ligament injury and/or arcuate ligament avulsion fracture. 4. Small suprapatellar joint effusion. 5. Small radio dense foreign body is noted within the subcutaneous soft tissues anterior to the proximal tibia.  [name] underwent : Left: OPEN REDUCTION INTERNAL FIXATION (ORIF) TIBIAL PLATEAU by Dr Doreatha Martin on 02/26/2020.   [name] was admitted to inpatient rehabilitation on 03/01/2020 and discharged home on 03/16/2020. She is receiving Home Health Therapy from [facility].  She states she has pain in her left knee and left leg. She rated her pain on health and history 0.  Also reports she has a good appetite.    [name] asked The Health and History Questions. This provider and [name] verified we were speaking with the correct person using two identifiers.   [name] Morphine equivalent is 20.00 MME.   Pain Inventory Average Pain varies Pain Right Now 0 My pain is intermittent, sharp and aching  LOCATION OF PAIN  Under knee and lower calf left side  BOWEL Number of stools per week: unsure  Oral laxative use Yes  Type of laxative senekot Enema or suppository use No  History of colostomy No  Incontinent No   BLADDER Normal In and out cath, frequency  Able to self cath No  Bladder incontinence Yes  Frequent urination Yes  Leakage with coughing Yes  has been wearing depends type products Difficulty starting stream No  Incomplete bladder emptying No    Mobility use a wheelchair transfers alone  Function I need assistance with the following:  bathing and toileting  Neuro/Psych bladder control problems numbness trouble walking spasms  Prior Studies Any changes since last visit?  no  Physicians involved in your care Any changes since last visit?  yes saws Dr Clancy Gourd Monday 04/01/20 (surgeon)   No family history on file. Social History   Socioeconomic History  . Marital status: Married    Spouse name: Not on file  . Number of children: Not on file  . Years of education: Not on file  . Highest education level: Not on file  Occupational History  . Not on file  Tobacco Use  . Smoking status: Former Research scientist (life sciences)  . Smokeless tobacco: Never Used  Substance and Sexual Activity  .  Alcohol use: Yes  . Drug use: Never  . Sexual activity: Not on file  Other Topics Concern  . Not on file  Social History Narrative  . Not on file   Social Determinants of Health   Financial Resource Strain: Not on file  Food Insecurity: Not on file  Transportation Needs: Not on file  Physical Activity: Not on file  Stress: Not on file   Social Connections: Not on file   Past Surgical History:  Procedure Laterality Date  . ORIF TIBIA PLATEAU Left 02/26/2020   Procedure: OPEN REDUCTION INTERNAL FIXATION (ORIF) TIBIAL PLATEAU;  Surgeon: Shona Needles, MD;  Location: [facility];  Service: Orthopedics;  Laterality: Left;   No past medical history on file. There were no vitals taken for this visit.  Opioid Risk Score:   Fall Risk Score:  `1  Depression screen PHQ 2/9  Depression screen PHQ 2/9 04/03/2020  Decreased Interest 0  Down, Depressed, Hopeless 1  PHQ - 2 Score 1  Altered sleeping 3  Tired, decreased energy 0  Change in appetite 0  Feeling bad or failure about yourself  1  Trouble concentrating 0  Moving slowly or fidgety/restless 0  Suicidal thoughts 0  PHQ-9 Score 5   Review of Systems  Constitutional: Negative.   HENT: Negative.   Eyes: Negative.   Respiratory: Negative.   Cardiovascular: Positive for leg swelling.       Left leg  Gastrointestinal: Negative.   Endocrine: Negative.   Genitourinary:       Incontinence--not new  Musculoskeletal: Positive for gait problem.  Skin: Negative.   Allergic/Immunologic: Negative.   Neurological: Positive for numbness.  Hematological: Negative.   Psychiatric/Behavioral: Negative.   All other systems reviewed and are negative.      Objective:   Physical Exam Vitals and nursing note reviewed.  Musculoskeletal:     Comments: No Physical Exam Performed: Telephone Visit           Assessment & Plan:  1. Left Medial Tibial Plateau Fracture: Continue Home Health Therapy with [facility]. Dr. Doreatha Martin Following.  2. Essential Hypertension: Continue current medication regimen. PCP Following.  3.Anxiety State: Continue Clonazepam as needed she reports she's using it sporadically. Continue monitor 4. Post- Operative Pain: Continue Tramadol 50 mg one tablet every 6 hours as needed for pain and Oxycodone one tablet every 6 hours as needed for pain.    F/U in 4- 6 weeks with Dr Posey Pronto.   Telephone Call Transitional Visit Location of Patient : In her Home Location of Provider: In the Office Total Time Spent 25 minutes

## 2020-04-05 ENCOUNTER — Telehealth: Payer: Self-pay | Admitting: Physical Medicine & Rehabilitation

## 2020-04-05 NOTE — Telephone Encounter (Signed)
Thank you :)

## 2020-04-05 NOTE — Telephone Encounter (Signed)
PT will be suspended til after the first of the year, Pt husband is suspected of Covid. PT cannot go into home safely concerned about spreading to other patients and family

## 2020-04-08 ENCOUNTER — Telehealth: Payer: Self-pay | Admitting: Registered Nurse

## 2020-04-08 MED ORDER — PREGABALIN 100 MG PO CAPS: 100.0000 mg | ORAL_CAPSULE | Freq: Three times a day (TID) | ORAL | 1 refills | Status: DC

## 2020-04-08 MED ORDER — OXYCODONE HCL 10 MG PO TABS: 10.0000 mg | ORAL_TABLET | Freq: Four times a day (QID) | ORAL | 0 refills | Status: DC | PRN

## 2020-04-08 MED ORDER — TRAMADOL HCL 50 MG PO TABS: 50.0000 mg | ORAL_TABLET | Freq: Four times a day (QID) | ORAL | 0 refills | Status: DC | PRN

## 2020-04-08 NOTE — Telephone Encounter (Signed)
Medications e-scribed Today. [name] is aware via My-Chart Message.

## 2020-04-23 ENCOUNTER — Telehealth: Payer: Self-pay

## 2020-04-23 NOTE — Telephone Encounter (Signed)
Verbal okay given per office protocol for PT twice a week for 4 weeks. Frances Furbish / [facility], ph # [phone]).

## 2020-05-02 ENCOUNTER — Other Ambulatory Visit: Payer: Self-pay

## 2020-05-02 ENCOUNTER — Encounter: Payer: BC Managed Care – PPO | Attending: Registered Nurse | Admitting: Physical Medicine & Rehabilitation

## 2020-05-02 ENCOUNTER — Encounter: Payer: Self-pay | Admitting: Physical Medicine & Rehabilitation

## 2020-05-02 VITALS — BP 154/82 | HR 81 | Temp 98.8°F | Ht 68.0 in | Wt 330.0 lb

## 2020-05-02 DIAGNOSIS — R32 Unspecified urinary incontinence: Secondary | ICD-10-CM | POA: Diagnosis present

## 2020-05-02 DIAGNOSIS — F411 Generalized anxiety disorder: Secondary | ICD-10-CM | POA: Diagnosis not present

## 2020-05-02 DIAGNOSIS — G479 Sleep disorder, unspecified: Secondary | ICD-10-CM

## 2020-05-02 DIAGNOSIS — R5381 Other malaise: Secondary | ICD-10-CM

## 2020-05-02 DIAGNOSIS — G8918 Other acute postprocedural pain: Secondary | ICD-10-CM

## 2020-05-02 NOTE — Progress Notes (Signed)
Subjective:    Patient ID: [name], female    DOB: 1955/11/22, 65 y.o.   MRN: [id number]  HPI Right-handed female with history of morbid obesity BMI 35.93 bipolar disorder hypertension as well as tobacco alcohol use left femur fracture 2 years ago that required surgical intervention presents follow up for left tibial plateau fracture status post ORIF on 02/25/2020.    Last seen by NP on 04/03/20, notes reviewed - tramadol reordered.  Patient husband had Covid and was delayed in therapies. Since that time, pt states she is still in PT, completed OT. BP is elevated, she is following up with PCP. Pain is intermittent. She was cleared WBAT last week. Anxiety is intermittent. Pruritis has resolved. Bowel movements are regular. Sleep is poor overall, baseline.  She is following up with Ortho.  She completed course of steroids, with benefit. She saw PCP.  Pain Inventory Average Pain 3 Pain Right Now 5 My pain is intermittent and stabbing  In the last 24 hours, has pain interfered with the following? General activity 5 Relation with others 5 Enjoyment of life 5 What TIME of day is your pain at its worst? morning  Sleep (in general) Poor  Pain is worse with: walking, bending and standing Pain improves with: rest, heat/ice, therapy/exercise, pacing activities and medication Relief from Meds: 9  use a walker how many minutes can you walk? 2 mins ability to climb steps?  no do you drive?  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 I need assistance with the following:  bathing, toileting, household duties and shopping Do you have any goals in this area?  yes  bladder control problems bowel control problems weakness numbness tingling trouble walking anxiety  Any changes since last visit?  yes x-rays Dr. Doreatha Martin   Any changes since last visit?  no Hospital follow up    No family history on file. Social  History   Socioeconomic History  . Marital status: Married    Spouse name: Not on file  . Number of children: Not on file  . Years of education: Not on file  . Highest education level: Not on file  Occupational History  . Not on file  Tobacco Use  . Smoking status: Former Research scientist (life sciences)  . Smokeless tobacco: Never Used  Vaping Use  . Vaping Use: Every day  . Substances: Nicotine, Flavoring  Substance and Sexual Activity  . Alcohol use: Yes  . Drug use: Never  . Sexual activity: Not on file  Other Topics Concern  . Not on file  Social History Narrative  . Not on file   Social Determinants of Health   Financial Resource Strain: Not on file  Food Insecurity: Not on file  Transportation Needs: Not on file  Physical Activity: Not on file  Stress: Not on file  Social Connections: Not on file   Past Surgical History:  Procedure Laterality Date  . ORIF TIBIA PLATEAU Left 02/26/2020   Procedure: OPEN REDUCTION INTERNAL FIXATION (ORIF) TIBIAL PLATEAU;  Surgeon: Shona Needles, MD;  Location: [facility];  Service: Orthopedics;  Laterality: Left;   History reviewed. No pertinent past medical history. BP (!) 154/82   Pulse 81   Temp 98.8 F (37.1 C)   Ht 5\' 8"  (1.727 m)   Wt (!) 330 lb (149.7 kg)   SpO2 95%   BMI 50.18 kg/m   Opioid Risk Score:   Fall  Risk Score:  `1  Depression screen PHQ 2/9  Depression screen [facility] 2/9 05/02/2020 04/03/2020  Decreased Interest 1 0  Down, Depressed, Hopeless 1 1  PHQ - 2 Score 2 1  Altered sleeping 2 3  Tired, decreased energy 2 0  Change in appetite 2 0  Feeling bad or failure about yourself  2 1  Trouble concentrating 2 0  Moving slowly or fidgety/restless 0 0  Suicidal thoughts 0 0  PHQ-9 Score 12 5   Review of Systems  Gastrointestinal:       Bowel leakage  Genitourinary: Positive for urgency.       Urine leakage, retention  Musculoskeletal: Positive for gait problem.       Tingling, spasms, numbness  Neurological: Positive for  weakness.  Psychiatric/Behavioral:       Anxiety  All other systems reviewed and are negative.     Objective:   Physical Exam  Constitutional: No distress . Vital signs reviewed. Morbidly obese.  HENT: Normocephalic.  Atraumatic. Eyes: EOMI. No discharge. Cardiovascular: No JVD.  RRR. Respiratory: Normal effort.  No stridor.  Bilateral clear to auscultation. GI: Non-distended.  BS +. Skin: Warm and dry.  Left knee incision healed. Psych: Normal mood.  Normal behavior. Musc: No edema in extremities (limited eval due to body habitus).  No tenderness in extremities. Neuro:  Alert Sensation diminished to light touch dorsal foot, persistent Motor: Bilateral upper extremities: 5/5 proximal distal Right lower extremity: 4+/5 proximal distal, stable Left lower extremity: Previously-hip flexion 4-/5 (some pain inhibition), knee extension 4/5, ankle dorsiflexion 4+/5    Assessment & Plan:  Right-handed female with history of morbid obesity BMI 35.93 bipolar disorder hypertension as well as tobacco alcohol use left femur fracture 2 years ago that required surgical intervention presents follow up for left tibial plateau fracture status post ORIF on 02/25/2020.  1.  Decreased functional mobility secondary to left tibial plateau fracture.  Status post ORIF 02/25/2020.  Nonweightbearing left lower extremity with hinged knee brace l during ambulation.    Cont therapies  Cont to follow up with Ortho.   2. Pain Management:    Improving             Wean Lyrica 100 daily, then d/c  Lidocaine OTC             Continue Robaxin PRN  3. Mood: Provide emotional support  Cont Klonopin prn  4.  Hypertension.    Cont meds  Elevated, encouraged follow up with PCP  5.  Urinary urgency-baseline, incontinent episode during visit             PVRs relatively unremarkable              Encouraged follow up with PCP  6.  Sleep disturbance             Encouraged daily melatonin, avoid prn benzos  7. CRPS              Resolved

## 2020-05-03 ENCOUNTER — Telehealth: Payer: Self-pay | Admitting: *Deleted

## 2020-05-03 NOTE — Telephone Encounter (Signed)
[facility] called to report that Mrs Connell requested PT be stopped since Dr Posey Pronto has given her permission to return to work on 05/06/20. Noted.

## 2020-05-08 ENCOUNTER — Other Ambulatory Visit: Payer: Self-pay | Admitting: Registered Nurse

## 2020-05-16 ENCOUNTER — Other Ambulatory Visit: Payer: Self-pay | Admitting: Family Medicine

## 2020-05-16 ENCOUNTER — Other Ambulatory Visit: Payer: Self-pay | Admitting: Student

## 2020-05-16 DIAGNOSIS — Z1231 Encounter for screening mammogram for malignant neoplasm of breast: Secondary | ICD-10-CM

## 2020-05-16 DIAGNOSIS — M810 Age-related osteoporosis without current pathological fracture: Secondary | ICD-10-CM

## 2020-05-16 DIAGNOSIS — M25552 Pain in left hip: Secondary | ICD-10-CM

## 2020-05-20 DIAGNOSIS — R5381 Other malaise: Secondary | ICD-10-CM

## 2020-05-20 NOTE — Telephone Encounter (Signed)
Ordered

## 2020-05-28 ENCOUNTER — Ambulatory Visit: Payer: BC Managed Care – PPO | Admitting: Physical Therapy

## 2020-05-29 ENCOUNTER — Ambulatory Visit
Admission: RE | Admit: 2020-05-29 | Discharge: 2020-05-29 | Disposition: A | Discharge status: Home / Self Care | Payer: BC Managed Care – PPO | Source: Ambulatory Visit | Attending: Student | Admitting: Student

## 2020-05-29 DIAGNOSIS — M25552 Pain in left hip: Secondary | ICD-10-CM

## 2020-06-04 ENCOUNTER — Ambulatory Visit: Payer: BC Managed Care – PPO | Admitting: Physical Therapy

## 2020-06-06 ENCOUNTER — Ambulatory Visit: Payer: BC Managed Care – PPO | Admitting: Physical Therapy

## 2020-06-13 ENCOUNTER — Encounter: Payer: Self-pay | Admitting: Physical Medicine & Rehabilitation

## 2020-06-13 ENCOUNTER — Encounter: Payer: BC Managed Care – PPO | Attending: Registered Nurse | Admitting: Physical Medicine & Rehabilitation

## 2020-06-13 ENCOUNTER — Other Ambulatory Visit: Payer: Self-pay

## 2020-06-13 VITALS — BP 142/74 | HR 78 | Temp 98.1°F | Ht 68.0 in | Wt 330.0 lb

## 2020-06-13 DIAGNOSIS — G479 Sleep disorder, unspecified: Secondary | ICD-10-CM | POA: Diagnosis present

## 2020-06-13 DIAGNOSIS — F411 Generalized anxiety disorder: Secondary | ICD-10-CM | POA: Insufficient documentation

## 2020-06-13 DIAGNOSIS — G8918 Other acute postprocedural pain: Secondary | ICD-10-CM | POA: Insufficient documentation

## 2020-06-13 DIAGNOSIS — R5381 Other malaise: Secondary | ICD-10-CM | POA: Insufficient documentation

## 2020-06-13 MED ORDER — METHOCARBAMOL 500 MG PO TABS
500.0000 mg | ORAL_TABLET | Freq: Three times a day (TID) | ORAL | 1 refills | Status: DC | PRN
Start: 2020-06-13 — End: 2020-08-28

## 2020-06-13 NOTE — Progress Notes (Addendum)
Subjective:    Patient ID: [name], female    DOB: Aug 30, 1955, 65 y.o.   MRN: [id number]  HPI Right-handed female with history of morbid obesity BMI 35.93 bipolar disorder hypertension as well as tobacco alcohol use left femur fracture 2 years ago that required surgical intervention presents follow up for left tibial plateau fracture status post ORIF on 02/25/2020.    Last clinic visit on 05/02/20.  Since that time, communication exchanged regarding FMLA.  She also had Covid. Patient states her therapies were hold and starts again next week. She continues to follow up with Ortho. She had CT of femur, which showed atrophy of muscle. She was not able to tolerate Lyrica wean. BP is controlled. She is not taking Robaxin. Sleep is poor (baseline).  Urinary urgency is improving.   Pain Inventory Average Pain 3 Pain Right Now 5 My pain is intermittent and stabbing  In the last 24 hours, has pain interfered with the following? General activity 5 Relation with others 5 Enjoyment of life 5 What TIME of day is your pain at its worst? morning  Sleep (in general) Poor  Pain is worse with: walking, bending and standing Pain improves with: rest, heat/ice, therapy/exercise, pacing activities and medication Relief from Meds: 9  use a walker how many minutes can you walk? 2 mins ability to climb steps?  no do you drive?  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 I need assistance with the following:  bathing, toileting, household duties and shopping Do you have any goals in this area?  yes  bladder control problems bowel control problems weakness numbness tingling trouble walking anxiety  Any changes since last visit?  yes x-rays Dr. Doreatha Martin   Any changes since last visit?  no Hospital follow up    History reviewed. No pertinent family history. Social History   Socioeconomic History  . Marital status:  Married    Spouse name: Not on file  . Number of children: Not on file  . Years of education: Not on file  . Highest education level: Not on file  Occupational History  . Not on file  Tobacco Use  . Smoking status: Former Research scientist (life sciences)  . Smokeless tobacco: Never Used  Vaping Use  . Vaping Use: Every day  . Substances: Nicotine, Flavoring  Substance and Sexual Activity  . Alcohol use: Yes  . Drug use: Never  . Sexual activity: Not on file  Other Topics Concern  . Not on file  Social History Narrative  . Not on file   Social Determinants of Health   Financial Resource Strain: Not on file  Food Insecurity: Not on file  Transportation Needs: Not on file  Physical Activity: Not on file  Stress: Not on file  Social Connections: Not on file   Past Surgical History:  Procedure Laterality Date  . ORIF TIBIA PLATEAU Left 02/26/2020   Procedure: OPEN REDUCTION INTERNAL FIXATION (ORIF) TIBIAL PLATEAU;  Surgeon: Shona Needles, MD;  Location: [place];  Service: Orthopedics;  Laterality: Left;   History reviewed. No pertinent past medical history. BP (!) 142/74   Pulse 78   Temp 98.1 F (36.7 C)   Ht 5\' 8"  (1.727 m)   Wt (!) 330 lb (149.7 kg)   SpO2 95%   BMI 50.18 kg/m   Opioid Risk Score:   Fall Risk Score:  `1  Depression screen PHQ 2/9  Depression  screen [facility] 2/9 05/02/2020 04/03/2020  Decreased Interest 1 0  Down, Depressed, Hopeless 1 1  PHQ - 2 Score 2 1  Altered sleeping 2 3  Tired, decreased energy 2 0  Change in appetite 2 0  Feeling bad or failure about yourself  2 1  Trouble concentrating 2 0  Moving slowly or fidgety/restless 0 0  Suicidal thoughts 0 0  PHQ-9 Score 12 5   Review of Systems  Gastrointestinal:       Bowel leakage  Genitourinary: Positive for urgency.       Urine leakage, retention  Musculoskeletal: Positive for gait problem.       Tingling, spasms, numbness  Neurological: Positive for weakness.  Psychiatric/Behavioral: Positive for sleep  disturbance.       Anxiety  All other systems reviewed and are negative.     Objective:   Physical Exam  Constitutional: No distress . Vital signs reviewed. HENT: Normocephalic.  Atraumatic. Eyes: EOMI. No discharge. Cardiovascular: No JVD.   Respiratory: Normal effort.  No stridor.   GI: Non-distended.   Skin: Warm and dry.  Intact. Psych: Normal mood.  Normal behavior. Musc: No edema in extremities (limited eval due to body habitus).  No tenderness in extremities. Gait: Mildly antalgic Neuro:  Alert Motor: Bilateral upper extremities: 5/5 proximal distal Right lower extremity: 4+/5 proximal distal, stable Left lower extremity: Hip flexion 4/5 (some pain inhibition), knee extension 4/5, ankle dorsiflexion 4+/5    Assessment & Plan:  Right-handed female with history of morbid obesity BMI 35.93 bipolar disorder hypertension as well as tobacco alcohol use left femur fracture 2 years ago that required surgical intervention presents follow up for left tibial plateau fracture status post ORIF on 02/25/2020.  1.  Decreased functional mobility secondary to left tibial plateau fracture.  Status post ORIF 02/25/2020.  Nonweightbearing left lower extremity with hinged knee brace l during ambulation.    Resume therapies  Cont to follow up with Ortho.   2. Pain Management:    Improving             Continue Lyrica 100 daily  Lidocaine OTC             Continue Robaxin 500 TID PRN  3.  Urinary urgency-baseline  Improving  4.  Sleep disturbance             Continue daily melatonin, avoid prn benzos  Baseline poor  Unable to update parts of note in [facility], please see paper copy

## 2020-06-18 ENCOUNTER — Ambulatory Visit: Payer: BC Managed Care – PPO | Attending: Physical Medicine & Rehabilitation

## 2020-06-18 ENCOUNTER — Other Ambulatory Visit: Payer: Self-pay

## 2020-06-18 DIAGNOSIS — M25552 Pain in left hip: Secondary | ICD-10-CM | POA: Diagnosis present

## 2020-06-18 DIAGNOSIS — M25662 Stiffness of left knee, not elsewhere classified: Secondary | ICD-10-CM | POA: Insufficient documentation

## 2020-06-18 DIAGNOSIS — M6281 Muscle weakness (generalized): Secondary | ICD-10-CM | POA: Insufficient documentation

## 2020-06-18 DIAGNOSIS — R2689 Other abnormalities of gait and mobility: Secondary | ICD-10-CM | POA: Insufficient documentation

## 2020-06-18 DIAGNOSIS — R2681 Unsteadiness on feet: Secondary | ICD-10-CM | POA: Diagnosis present

## 2020-06-18 DIAGNOSIS — S82141A Displaced bicondylar fracture of right tibia, initial encounter for closed fracture: Secondary | ICD-10-CM | POA: Diagnosis present

## 2020-06-18 DIAGNOSIS — M25562 Pain in left knee: Secondary | ICD-10-CM | POA: Insufficient documentation

## 2020-06-18 DIAGNOSIS — S82142S Displaced bicondylar fracture of left tibia, sequela: Secondary | ICD-10-CM | POA: Diagnosis present

## 2020-06-18 DIAGNOSIS — R52 Pain, unspecified: Secondary | ICD-10-CM | POA: Insufficient documentation

## 2020-06-18 DIAGNOSIS — T148XXA Other injury of unspecified body region, initial encounter: Secondary | ICD-10-CM | POA: Diagnosis present

## 2020-06-18 DIAGNOSIS — R293 Abnormal posture: Secondary | ICD-10-CM | POA: Insufficient documentation

## 2020-06-18 NOTE — Therapy (Signed)
[facility] [place]. [place], [place], [address] Phone: [phone]   Fax:  [phone]  Physical Therapy Evaluation  Patient Details  Name: [name] MRN: [id number] Date of Birth: October 08, 1955 Referring Provider (PT): Ankit Lorie Phenix, MD - PM&R   Encounter Date: 06/18/2020   PT End of Session - 06/18/20 0904    Visit Number 1    Number of Visits 17    Date for PT Re-Evaluation 08/13/20    Authorization Type BCBS    PT Start Time 0802    PT Stop Time 0848    PT Time Calculation (min) 46 min    Activity Tolerance Patient tolerated treatment well;Patient limited by pain    Behavior During Therapy [facility] for tasks assessed/performed           History reviewed. No pertinent past medical history.  Past Surgical History:  Procedure Laterality Date  . ORIF TIBIA PLATEAU Left 02/26/2020   Procedure: OPEN REDUCTION INTERNAL FIXATION (ORIF) TIBIAL PLATEAU;  Surgeon: Shona Needles, MD;  Location: [place];  Service: Orthopedics;  Laterality: Left;    There were no vitals filed for this visit.    Subjective Assessment - 06/18/20 0804    Subjective November 6th tibial plateau fx, did [facility] until Nov 27th for a few weeks and then had some home health PT which ended end of December 2021. reports Tibial plataeu fx  In november occured when standing and twisting, fell from pain after fracture was felt. Pt reports she has been sedentary for 3 years. Originally out and about but then started to have hip pain and spent much more time in recliner at home. Using a WC at home because cant tolerate standing a long time. Very fearful of falling, being very careful. reports husband is older, afraid of falling and not being able to get up.    Pertinent History L fx femur 2019. History of HTN. Obesity. Had COVID Feb 1st.    How long can you stand comfortably? no more than 3 minutes before increasing pain    How long can you walk comfortably?  Using RW for short distances.    Diagnostic tests L hip CT 05/29/20: 1. Healed intertrochanteric femur fracture status post ORIF. No  hardware complication. 2. Mild right hip osteoarthritis.    Patient Stated Goals To be abe to get out and about on her own, to try and walk without walker, stand and cook    Currently in Pain? Yes    Pain Score 3    7/10 with cooking, getting dressed   Pain Location Knee    Pain Orientation Left    Pain Descriptors / Indicators Aching;Stabbing;Cramping;Tightness    Pain Type Acute pain;Chronic pain    Pain Onset More than a month ago    Pain Frequency Constant    Aggravating Factors  weight on it, standing,  laying down for too long - needs to switch positions ~every hour and a half    Pain Relieving Factors not moving, ice pack, prescription ms relaxer, takes tylenol/ibuprofen as needed              [facility] PT Assessment - 06/18/20 0001      Assessment   Medical Diagnosis Debility    Referring Provider (PT) Ankit Lorie Phenix, MD - PM&R    Onset Date/Surgical Date 02/24/20   L tibial plataeu fracture with ORIF 02/26/20   Hand Dominance Right    Next MD  Visit Has not followed up with surgeon because she says he is out of network    Prior Therapy Inpatient rehab, Home health PT      Restrictions   Other Position/Activity Restrictions Per pt report WBAT      Balance Screen   Has the patient fallen in the past 6 months Yes    How many times? 1    Has the patient had a decrease in activity level because of a fear of falling?  Yes    Is the patient reluctant to leave their home because of a fear of falling?  Yes      Home Environment   Additional Comments house -1 level. 1 step up to front stoop but bought aa 1/2 step from [place], goes up stp with walker.  Have 1 step wit a ramp over to get into home from garage. Lives at home with husband who is older. Bathrooms with baths/tubs that must lift leg to get in but right now using a shower chair to help  transfer. 4 dogs at home      Prior Function   Level of Independence Independent with household mobility with device   used RW  when in alot of pain. Otherwise using 4 point cane.   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      Cognition   Overall Cognitive Status Within [employer] for tasks assessed      Observation/Other Assessments   Observations some swelling L knee, scar L knee well healing, TTP upper tibia medial > lateral      ROM / Strength   AROM / PROM / Strength AROM;PROM;Strength      AROM   AROM Assessment Site Knee    Right/Left Hip Right    Right/Left Knee Right;Left    Right Knee Extension 0    Right Knee Flexion 108   110, sitting   Left Knee Extension -3    Left Knee Flexion 75   80 passive, both taken in sitting     Strength   Strength Assessment Site Hip;Knee    Right/Left Hip Right;Left    Right Hip Flexion 3+/5    Right Hip ABduction 3-/5    Left Hip Flexion 3/5    Left Hip ABduction 3-/5    Right/Left Knee Right;Left    Right Knee Flexion 4-/5    Right Knee Extension 4+/5    Left Knee Flexion 3/5    Left Knee Extension 3/5      Flexibility   Soft Tissue Assessment /Muscle Length yes    Hamstrings mod tightness B    Quadriceps mod tightness L>>R    Piriformis tight B      Palpation   Patella mobility diminished on the Left all directions    Palpation comment TTP distal medial and lateral knee      Transfers   Five time sit to stand comments  0 - requires BUE assist to complete sit to stand, limited by pain      Ambulation/Gait   Assistive device Rolling walker    Gait Pattern Step-to pattern;Step-through pattern;Decreased arm swing - right;Decreased arm swing - left;Decreased step length - left;Decreased stance time - left;Decreased step length - right;Decreased hip/knee flexion - left;Decreased weight shift to left;Decreased trunk rotation;Trunk  flexed   lateral trunk leaning   Ambulation Surface Level;Indoor    Gait velocity slow, antalgic  Modified Clinical Test of Sensory Interaction in Balance (MCTSIB) Condition 1 (Eyes Open, Firm surface): 15 mod sway /30 seconds Condition 2 (Eyes Closed, Firm Surface): 10 mod-max sway /30 seconds Condition 3 (Eyes Open, Foam Surface): unable /30 seconds Condition 4 (Eyes Closed, Foam Surface): unable /30 seconds Total Score: 25/120  - Significant fear of falling, Close guarding provided throughout   Objective measurements completed on examination: See above findings.       PT Education - 06/18/20 0857    Education Details Initial PT POC, PT findings, discussed rehab goals and expectations.  Initial HEP: Access Code: [email or web address]. Exercises  Seated Long Arc Quad - 1 x daily - 5 x weekly - 2 sets - 10 reps  Seated March - 1 x daily - 5 x weekly - 2 sets - 10 reps  Seated Heel Toe Raises - 1 x daily - 5 x weekly - 2 sets - 10 reps  Seated Hamstring Stretch - 1 x daily - 5 x weekly - 2 sets - 20-30 seconds hold    Person(s) Educated Patient    Methods Explanation;Demonstration;Handout    Comprehension Verbalized understanding;Returned demonstration;Need further instruction            PT Short Term Goals - 06/18/20 0918      PT SHORT TERM GOAL #1   Title Independent with initial HEP    Time 2    Period Weeks    Status New    Target Date 07/02/20      PT SHORT TERM GOAL #2   Title Pt will complete at least 25 seconds with minimal sway for MCTSIB conditions 1 and 2 to facilitate progress with safe static standing balance    Time 4    Period Weeks    Status New    Target Date 07/16/20             PT Long Term Goals - 06/18/20 0918      PT LONG TERM GOAL #1   Title Independent with advanced HEP    Time 8    Period Weeks    Status New    Target Date 08/13/20      PT LONG TERM GOAL #2   Title Pt will achieve L Knee ROM 0-110 with </= 2/10 pain to facilitate  safe functional mobility.    Time 8    Period Weeks    Status New    Target Date 08/13/20      PT LONG TERM GOAL #3   Title Pt will be able to ambulate with minimal asymmetries and no LOB with least restrictive assistive device (LRAD) with </= 1/10 pain .    Time 8    Period Weeks    Status New    Target Date 08/13/20      PT LONG TERM GOAL #4   Title Pt will report improved standing tolerance with pain </= 1/10 and no LOB for cooking and hygeine activities in the home    Time 8    Period Weeks    Status New    Target Date 08/13/20      PT LONG TERM GOAL #5   Title BLE strength at least 4+/5    Time 8    Period Weeks    Status New    Target Date 08/13/20                  Plan - 06/18/20 0904    Clinical Impression Statement Pt  is a 65 yo female who was reffered for physical therapy evaluation for debility. PMH is significant for L tibial plataeu fx and ORIF 02/26/2020, 2019 L femur fx with Intramedullary nail, obesity, and recent COVID early February.  Melodye currently presents with L knee pain,LE ms tightness,  decreased L knee ROM, general muscle weakness, limited endurance and activity tolerance, decreased balance and high fear of falling, and abnormal gait. She currently is using a RW for short distance ambulation with very limited standing tolerance  and high fear of falling when standing without UE support. She is otherwise using a wheelchair. PLOF pre tibial fx was independent with walker or quad cane, but pt reports 3 yr history of being sedentary which begn with hip pain. Pt is motivated to try and become more independent again with standing, walking, ADLs. Rubie would benefit from skilled physical therapy to address the aforementioned impairments, decrease pain and fall risk,  and improve overall safe functional mobility.    Personal Factors and Comorbidities Comorbidity 3+    Comorbidities Obesity, HTN, L femur fx history, Debility    Examination-Activity Limitations  Bathing;Bend;Caring for Others;Carry;Dressing;Hygiene/Grooming;Stand;Stairs;Squat;Locomotion Level;Transfers    Examination-Participation Restrictions Art gallery manager;Interpersonal Relationship;Meal Prep    Stability/Clinical Decision Making Evolving/Moderate complexity    Clinical Decision Making Moderate    Rehab Potential Fair    PT Frequency 2x / week    PT Duration 8 weeks    PT Treatment/Interventions ADLs/Self Care Home Management;Cryotherapy;Electrical Stimulation;Iontophoresis 4mg /ml Dexamethasone;Moist Heat;Gait training;DME Instruction;Neuromuscular re-education;Balance training;Therapeutic exercise;Therapeutic activities;Functional mobility training;Stair training;Patient/family education;Manual techniques;Energy conservation;Scar mobilization;Taping;Vasopneumatic Device    PT Next Visit Plan Reassess HEP. Assess TUG.  Initiate progressive LE TE and funcitonal mobility training, L knee ROM within painfree ranges, flexibility. WBAT LLE. Incorporate balance training as tolerated.    PT Home Exercise Plan see pt edu    Consulted and Agree with Plan of Care Patient           Patient will benefit from skilled therapeutic intervention in order to improve the following deficits and impairments:  Abnormal gait,Decreased range of motion,Obesity,Decreased endurance,Difficulty walking,Decreased activity tolerance,Pain,Improper body mechanics,Impaired flexibility,Decreased balance,Decreased mobility,Decreased strength,Increased edema  Visit Diagnosis: Stiffness of left knee, not elsewhere classified - Plan: PT plan of care cert/re-cert  Pain - Plan: PT plan of care cert/re-cert  Muscle weakness (generalized) - Plan: PT plan of care cert/re-cert  Left knee pain, unspecified chronicity - Plan: PT plan of care cert/re-cert  Pain in left hip - Plan: PT plan of care cert/re-cert  Unsteadiness on feet - Plan: PT plan of care cert/re-cert  Other abnormalities of gait and mobility  - Plan: PT plan of care cert/re-cert  Abnormal posture - Plan: PT plan of care cert/re-cert     Problem List Patient Active Problem List   Diagnosis Date Noted  . Debility 05/02/2020  . Urinary incontinence 05/02/2020  . Pain in left shin   . Urinary frequency   . Sleep disturbance   . Pain   . Anxiety state   . Urinary urgency   . Drug induced constipation   . Post-operative pain   . Benign essential HTN   . Pruritus   . Left medial tibial plateau fracture 03/01/2020  . Morbid obesity ([place])   . Acute blood loss anemia   . Postoperative pain   . Tibial plateau fracture 02/25/2020  . Tibial plateau fracture, left 02/25/2020  . Hyponatremia 02/25/2020  . Alcohol use disorder, moderate, dependence ([place]) 02/25/2020  . Essential hypertension 02/25/2020  Hall Busing, PT, DPT 06/18/2020, 9:32 AM  [place]. [place], [place], [address] Phone: [phone]   Fax:  [phone]  Name: [name] MRN: [id number] Date of Birth: [birth year]

## 2020-06-21 ENCOUNTER — Ambulatory Visit: Payer: BC Managed Care – PPO

## 2020-06-21 ENCOUNTER — Other Ambulatory Visit: Payer: Self-pay

## 2020-06-21 DIAGNOSIS — R52 Pain, unspecified: Secondary | ICD-10-CM

## 2020-06-21 DIAGNOSIS — S82141A Displaced bicondylar fracture of right tibia, initial encounter for closed fracture: Secondary | ICD-10-CM

## 2020-06-21 DIAGNOSIS — M25562 Pain in left knee: Secondary | ICD-10-CM

## 2020-06-21 DIAGNOSIS — M6281 Muscle weakness (generalized): Secondary | ICD-10-CM

## 2020-06-21 DIAGNOSIS — M25552 Pain in left hip: Secondary | ICD-10-CM

## 2020-06-21 DIAGNOSIS — R2681 Unsteadiness on feet: Secondary | ICD-10-CM

## 2020-06-21 DIAGNOSIS — R293 Abnormal posture: Secondary | ICD-10-CM

## 2020-06-21 DIAGNOSIS — M25662 Stiffness of left knee, not elsewhere classified: Secondary | ICD-10-CM | POA: Diagnosis not present

## 2020-06-21 DIAGNOSIS — S82142S Displaced bicondylar fracture of left tibia, sequela: Secondary | ICD-10-CM

## 2020-06-21 DIAGNOSIS — R2689 Other abnormalities of gait and mobility: Secondary | ICD-10-CM

## 2020-06-21 DIAGNOSIS — T148XXA Other injury of unspecified body region, initial encounter: Secondary | ICD-10-CM

## 2020-06-21 NOTE — Therapy (Signed)
[place]. [place], [place], [address] Phone: [phone]   Fax:  [phone]  Physical Therapy Treatment  Patient Details  Name: [name] MRN: [id number] Date of Birth: July 10, 1955 Referring Provider (PT): Ankit Lorie Phenix, MD - PM&R   Encounter Date: 06/21/2020   PT End of Session - 06/21/20 0926    Visit Number 2    Number of Visits 17    Date for PT Re-Evaluation 08/13/20    Authorization Type BCBS    PT Start Time 0845    PT Stop Time 0930    PT Time Calculation (min) 45 min    Activity Tolerance Patient tolerated treatment well;Patient limited by pain    Behavior During Therapy [facility] for tasks assessed/performed           History reviewed. No pertinent past medical history.  Past Surgical History:  Procedure Laterality Date  . ORIF TIBIA PLATEAU Left 02/26/2020   Procedure: OPEN REDUCTION INTERNAL FIXATION (ORIF) TIBIAL PLATEAU;  Surgeon: Shona Needles, MD;  Location: [facility];  Service: Orthopedics;  Laterality: Left;    There were no vitals filed for this visit.   Subjective Assessment - 06/21/20 0849    Subjective 6-7/10 pain past few days. HEP exercises going okay, trying to do them in sitting but gets tired. Back has also been bothering when doing some exercises    Pertinent History L fx femur 2019. History of HTN. Obesity. Had COVID Feb 1st.    Diagnostic tests L hip CT 05/29/20: 1. Healed intertrochanteric femur fracture status post ORIF. No  hardware complication. 2. Mild right hip osteoarthritis.    Patient Stated Goals To be abe to get out and about on her own, to try and walk without walker, stand and cook    Currently in Pain? Yes    Pain Score 6     Pain Location Knee    Pain Orientation Left    Pain Descriptors / Indicators Aching;Tightness              [facility] PT Assessment - 06/21/20 0001      Standardized Balance Assessment   Standardized Balance Assessment Timed Up and Go Test       Timed Up and Go Test   Normal TUG (seconds) 43   FWW                        [facility] Adult PT Treatment/Exercise - 06/21/20 0001      Exercises   Exercises Knee/Hip;Lumbar;Ankle      Lumbar Exercises: Stretches   Active Hamstring Stretch Limitations Seated HS stretch 30" x 1 B      Lumbar Exercises: Supine   Heel Slides 5 reps   2 sets with with towel under foot in sitting, bilateral     Knee/Hip Exercises: Seated   Ball Squeeze 10 x 2 green ball    Clamshell with TheraBand Red   10 x 2     Knee/Hip Exercises: Supine   Quad Sets Strengthening;Left;10 reps   Seated     Modalities   Modalities Cryotherapy;Electrical Stimulation      Cryotherapy   Number Minutes Cryotherapy 10 Minutes    Cryotherapy Location Knee   left   Type of Cryotherapy Ice pack      Electrical Stimulation   Electrical Stimulation Location L knee - anteromedial    Electrical Stimulation Action IFC    Electrical Stimulation Goals  Tone;Pain                  PT Education - 06/21/20 0626    Education Details Reinforced HEP. Educated on use of bakcrest/back support on chair with seated exercises to decrease tension/strain.    Person(s) Educated Patient    Methods Explanation;Demonstration    Comprehension Verbalized understanding;Returned demonstration            PT Short Term Goals - 06/18/20 0918      PT SHORT TERM GOAL #1   Title Independent with initial HEP    Time 2    Period Weeks    Status New    Target Date 07/02/20      PT SHORT TERM GOAL #2   Title Pt will complete at least 25 seconds with minimal sway for MCTSIB conditions 1 and 2 to facilitate progress with safe static standing balance    Time 4    Period Weeks    Status New    Target Date 07/16/20             PT Long Term Goals - 06/18/20 0918      PT LONG TERM GOAL #1   Title Independent with advanced HEP    Time 8    Period Weeks    Status New    Target Date 08/13/20      PT LONG TERM  GOAL #2   Title Pt will achieve L Knee ROM 0-110 with </= 2/10 pain to facilitate safe functional mobility.    Time 8    Period Weeks    Status New    Target Date 08/13/20      PT LONG TERM GOAL #3   Title Pt will be able to ambulate with minimal asymmetries and no LOB with least restrictive assistive device (LRAD) with </= 1/10 pain .    Time 8    Period Weeks    Status New    Target Date 08/13/20      PT LONG TERM GOAL #4   Title Pt will report improved standing tolerance with pain </= 1/10 and no LOB for cooking and hygeine activities in the home    Time 8    Period Weeks    Status New    Target Date 08/13/20      PT LONG TERM GOAL #5   Title BLE strength at least 4+/5    Time 8    Period Weeks    Status New    Target Date 08/13/20                 Plan - 06/21/20 0926    Clinical Impression Statement [name] tolerated exercises well today. Continues to be limited by pain and weakness. TUG assessed today with significantly decreased gait speed, completed in 43 seconds indicating high falls risk. Ended today's session with ice pack and e-stim with good tolerance - skin intact pre and post. Pt educated on safe ice use for home and reinforced seated HEP, exercises for strength and mobility to do throughout the work day.    Personal Factors and Comorbidities Comorbidity 3+    Comorbidities Obesity, HTN, L femur fx history, Debility    Examination-Activity Limitations Bathing;Bend;Caring for Others;Carry;Dressing;Hygiene/Grooming;Stand;Stairs;Squat;Locomotion Level;Transfers    Examination-Participation Restrictions Art gallery manager;Interpersonal Relationship;Meal Prep    Rehab Potential Fair    PT Frequency 2x / week    PT Duration 8 weeks    PT Treatment/Interventions ADLs/Self Care Home Management;Cryotherapy;Electrical Stimulation;Iontophoresis 4mg /ml  Dexamethasone;Moist Heat;Gait training;DME Instruction;Neuromuscular re-education;Balance training;Therapeutic  exercise;Therapeutic activities;Functional mobility training;Stair training;Patient/family education;Manual techniques;Energy conservation;Scar mobilization;Taping;Vasopneumatic Device    PT Next Visit Plan Initiate progressive LE TE and funcitonal mobility training, L knee ROM within painfree ranges, flexibility. WBAT LLE. Incorporate balance training, standing tolerance as tolerated.    PT Home Exercise Plan see pt edu    Consulted and Agree with Plan of Care Patient           Patient will benefit from skilled therapeutic intervention in order to improve the following deficits and impairments:  Abnormal gait,Decreased range of motion,Obesity,Decreased endurance,Difficulty walking,Decreased activity tolerance,Pain,Improper body mechanics,Impaired flexibility,Decreased balance,Decreased mobility,Decreased strength,Increased edema  Visit Diagnosis: Pain  Muscle weakness (generalized)  Left knee pain, unspecified chronicity  Pain in left hip  Unsteadiness on feet  Other abnormalities of gait and mobility  Abnormal posture  Stiffness of left knee, not elsewhere classified  Closed fracture of right tibial plateau, initial encounter  Fracture  Closed fracture of left tibial plateau, sequela     Problem List Patient Active Problem List   Diagnosis Date Noted  . Debility 05/02/2020  . Urinary incontinence 05/02/2020  . Pain in left shin   . Urinary frequency   . Sleep disturbance   . Pain   . Anxiety state   . Urinary urgency   . Drug induced constipation   . Post-operative pain   . Benign essential HTN   . Pruritus   . Left medial tibial plateau fracture 03/01/2020  . Morbid obesity ([place])   . Acute blood loss anemia   . Postoperative pain   . Tibial plateau fracture 02/25/2020  . Tibial plateau fracture, left 02/25/2020  . Hyponatremia 02/25/2020  . Alcohol use disorder, moderate, dependence ([place]) 02/25/2020  . Essential hypertension 02/25/2020    Hall Busing , PT, DPT 06/21/2020, 9:37 AM  [place]. [place], [place], [address] Phone: [phone]   Fax:  [phone]  Name: Aeron Lheureux MRN: [id number] Date of Birth: 01/19/1956

## 2020-06-25 ENCOUNTER — Other Ambulatory Visit: Payer: Self-pay

## 2020-06-25 ENCOUNTER — Ambulatory Visit: Payer: BC Managed Care – PPO

## 2020-06-25 DIAGNOSIS — R293 Abnormal posture: Secondary | ICD-10-CM

## 2020-06-25 DIAGNOSIS — R52 Pain, unspecified: Secondary | ICD-10-CM

## 2020-06-25 DIAGNOSIS — R2689 Other abnormalities of gait and mobility: Secondary | ICD-10-CM

## 2020-06-25 DIAGNOSIS — M25562 Pain in left knee: Secondary | ICD-10-CM

## 2020-06-25 DIAGNOSIS — M25662 Stiffness of left knee, not elsewhere classified: Secondary | ICD-10-CM

## 2020-06-25 DIAGNOSIS — R2681 Unsteadiness on feet: Secondary | ICD-10-CM

## 2020-06-25 DIAGNOSIS — M25552 Pain in left hip: Secondary | ICD-10-CM

## 2020-06-25 DIAGNOSIS — S82142S Displaced bicondylar fracture of left tibia, sequela: Secondary | ICD-10-CM

## 2020-06-25 DIAGNOSIS — M6281 Muscle weakness (generalized): Secondary | ICD-10-CM

## 2020-06-25 DIAGNOSIS — T148XXA Other injury of unspecified body region, initial encounter: Secondary | ICD-10-CM

## 2020-06-25 NOTE — Therapy (Signed)
[place]. [place], [place], [address] Phone: [phone]   Fax:  [phone]  Physical Therapy Treatment  Patient Details  Name: [name] MRN: [id number] Date of Birth: 10/19/1955 Referring Provider (PT): Ankit Lorie Phenix, MD - PM&R   Encounter Date: 06/25/2020   PT End of Session - 06/25/20 0846    Visit Number 3    Number of Visits 17    Date for PT Re-Evaluation 08/13/20    Authorization Type BCBS    PT Start Time 0800    PT Stop Time 0843    PT Time Calculation (min) 43 min    Activity Tolerance Patient tolerated treatment well;Patient limited by pain    Behavior During Therapy [facility] for tasks assessed/performed           History reviewed. No pertinent past medical history.  Past Surgical History:  Procedure Laterality Date  . ORIF TIBIA PLATEAU Left 02/26/2020   Procedure: OPEN REDUCTION INTERNAL FIXATION (ORIF) TIBIAL PLATEAU;  Surgeon: Shona Needles, MD;  Location: [place];  Service: Orthopedics;  Laterality: Left;    There were no vitals filed for this visit.   Subjective Assessment - 06/25/20 0802    Subjective 1/10 pain at rest, 6-7/10 pain at worst. Felt a bit better some time after the last HEP exercises going okay, trying to do them in sitting but gets tired. Back has also been bothering when doing some exercises. reports new Goal: to be able to tolerate driving 6 hrs and walk with or without walker to go to [facility] in [place] for memorial day weekend    Pertinent History L fx femur 2019. History of HTN. Obesity. Had COVID Feb 1st.    Diagnostic tests L hip CT 05/29/20: 1. Healed intertrochanteric femur fracture status post ORIF. No  hardware complication. 2. Mild right hip osteoarthritis.    Patient Stated Goals To be abe to get out and about on her own, to try and walk without walker, stand and cook    Currently in Pain? Yes    Pain Score 1     Pain Location Knee    Pain Orientation  Left    Pain Descriptors / Indicators Tightness;Aching    Pain Type Acute pain                             [facility] Adult PT Treatment/Exercise - 06/25/20 0823      Lumbar Exercises: Stretches   Active Hamstring Stretch Limitations Seated HS stretch 30" x 1 B    Quad Stretch Limitations Seated 30 seconds x 4 with strap      Lumbar Exercises: Seated   LAQ on Chair Limitations 10 x 2 cues to prevent knee hyperext      Knee/Hip Exercises: Standing   Other Standing Knee Exercises Mini squats with BUE on walker  3 sets of 5      Knee/Hip Exercises: Seated   Ball Squeeze 10 x 2 green ball    Clamshell with TheraBand Green   10 x 2     Knee/Hip Exercises: Supine   Quad Sets Strengthening;Left;10 reps      Manual Therapy   Manual Therapy Soft tissue mobilization;Passive ROM    Soft tissue mobilization STM to distal knee, distal scar L knee- puckering/tightness noted at end of scar with some tenderness that improved post gentle STM    Passive ROM AA/P knee  ext/flexio with gentle end range holds      Ankle Exercises: Seated   Heel Raises Both   2 sets 10     Ankle Exercises: Stretches   Other Stretch Gastro-sol  stretch with strap 20 sec x 2                    PT Short Term Goals - 06/18/20 0918      PT SHORT TERM GOAL #1   Title Independent with initial HEP    Time 2    Period Weeks    Status New    Target Date 07/02/20      PT SHORT TERM GOAL #2   Title Pt will complete at least 25 seconds with minimal sway for MCTSIB conditions 1 and 2 to facilitate progress with safe static standing balance    Time 4    Period Weeks    Status New    Target Date 07/16/20             PT Long Term Goals - 06/18/20 0918      PT LONG TERM GOAL #1   Title Independent with advanced HEP    Time 8    Period Weeks    Status New    Target Date 08/13/20      PT LONG TERM GOAL #2   Title Pt will achieve L Knee ROM 0-110 with </= 2/10 pain to facilitate safe  functional mobility.    Time 8    Period Weeks    Status New    Target Date 08/13/20      PT LONG TERM GOAL #3   Title Pt will be able to ambulate with minimal asymmetries and no LOB with least restrictive assistive device (LRAD) with </= 1/10 pain .    Time 8    Period Weeks    Status New    Target Date 08/13/20      PT LONG TERM GOAL #4   Title Pt will report improved standing tolerance with pain </= 1/10 and no LOB for cooking and hygeine activities in the home    Time 8    Period Weeks    Status New    Target Date 08/13/20      PT LONG TERM GOAL #5   Title BLE strength at least 4+/5    Time 8    Period Weeks    Status New    Target Date 08/13/20                 Plan - 06/25/20 0829    Clinical Impression Statement Tamme continues totolerate exercises fairly well. Some reports of sharp pain at distal knee near end of scar start of session with knee bends that decreased after gentle PROM and STM to distal knee. Sharp pain not reported for rest of session,only muscle fatigue. She was very motivated to get through exercises but was educated on importance of distinguishing muscle fatigue/working pain vs stretch vs sharp point pain and educated in not pushing into any sharp pains and report if they occur again. She tolerated progression of exercises with some standing minisquats well today. Pt repors she will be following p with surgeon/insurance to find ortho in network for follow up   Personal Factors and Comorbidities Comorbidity 3+    Comorbidities Obesity, HTN, L femur fx history, Debility    Examination-Activity Limitations Bathing;Bend;Caring for Others;Carry;Dressing;Hygiene/Grooming;Stand;Stairs;Squat;Locomotion Level;Transfers    Examination-Participation Restrictions Art gallery manager;Interpersonal Relationship;Meal Prep  Rehab Potential Fair    PT Frequency 2x / week    PT Duration 8 weeks    PT Treatment/Interventions ADLs/Self Care Home  Management;Cryotherapy;Electrical Stimulation;Iontophoresis 4mg /ml Dexamethasone;Moist Heat;Gait training;DME Instruction;Neuromuscular re-education;Balance training;Therapeutic exercise;Therapeutic activities;Functional mobility training;Stair training;Patient/family education;Manual techniques;Energy conservation;Scar mobilization;Taping;Vasopneumatic Device    PT Next Visit Plan Initiate progressive LE TE and funcitonal mobility training, L knee ROM within painfree ranges, flexibility. WBAT LLE. Incorporate balance training, standing tolerance as tolerated. Progress stadning exercises as tolerated next visit.    Consulted and Agree with Plan of Care Patient           Patient will benefit from skilled therapeutic intervention in order to improve the following deficits and impairments:  Abnormal gait,Decreased range of motion,Obesity,Decreased endurance,Difficulty walking,Decreased activity tolerance,Pain,Improper body mechanics,Impaired flexibility,Decreased balance,Decreased mobility,Decreased strength,Increased edema  Visit Diagnosis: Pain  Stiffness of left knee, not elsewhere classified  Muscle weakness (generalized)  Left knee pain, unspecified chronicity  Pain in left hip  Unsteadiness on feet  Other abnormalities of gait and mobility  Abnormal posture  Closed fracture of left tibial plateau, sequela  Fracture     Problem List Patient Active Problem List   Diagnosis Date Noted  . Debility 05/02/2020  . Urinary incontinence 05/02/2020  . Pain in left shin   . Urinary frequency   . Sleep disturbance   . Pain   . Anxiety state   . Urinary urgency   . Drug induced constipation   . Post-operative pain   . Benign essential HTN   . Pruritus   . Left medial tibial plateau fracture 03/01/2020  . Morbid obesity ([place])   . Acute blood loss anemia   . Postoperative pain   . Tibial plateau fracture 02/25/2020  . Tibial plateau fracture, left 02/25/2020  . Hyponatremia  02/25/2020  . Alcohol use disorder, moderate, dependence ([place]) 02/25/2020  . Essential hypertension 02/25/2020    Hall Busing, PT, DPT 06/25/2020, 9:26 AM  [place]. [place], [place], [address] Phone: [phone]   Fax:  [phone]  Name: Paw Karstens MRN: [id number] Date of Birth: 08/27/1955

## 2020-06-27 ENCOUNTER — Other Ambulatory Visit: Payer: Self-pay

## 2020-06-27 ENCOUNTER — Ambulatory Visit: Payer: BC Managed Care – PPO

## 2020-06-27 DIAGNOSIS — M25662 Stiffness of left knee, not elsewhere classified: Secondary | ICD-10-CM

## 2020-06-27 DIAGNOSIS — M25562 Pain in left knee: Secondary | ICD-10-CM

## 2020-06-27 DIAGNOSIS — R52 Pain, unspecified: Secondary | ICD-10-CM

## 2020-06-27 DIAGNOSIS — R2689 Other abnormalities of gait and mobility: Secondary | ICD-10-CM

## 2020-06-27 DIAGNOSIS — M25552 Pain in left hip: Secondary | ICD-10-CM

## 2020-06-27 DIAGNOSIS — M6281 Muscle weakness (generalized): Secondary | ICD-10-CM

## 2020-06-27 DIAGNOSIS — T148XXA Other injury of unspecified body region, initial encounter: Secondary | ICD-10-CM

## 2020-06-27 DIAGNOSIS — R2681 Unsteadiness on feet: Secondary | ICD-10-CM

## 2020-06-27 DIAGNOSIS — S82141A Displaced bicondylar fracture of right tibia, initial encounter for closed fracture: Secondary | ICD-10-CM

## 2020-06-27 DIAGNOSIS — R293 Abnormal posture: Secondary | ICD-10-CM

## 2020-06-27 DIAGNOSIS — S82142S Displaced bicondylar fracture of left tibia, sequela: Secondary | ICD-10-CM

## 2020-06-27 NOTE — Therapy (Signed)
[place]. [place], [place], [address] Phone: [phone]   Fax:  [phone]  Physical Therapy Treatment  Patient Details  Name: [name] MRN: [id number] Date of Birth: Feb 03, 1956 Referring Provider (PT): Ankit Lorie Phenix, MD - PM&R   Encounter Date: 06/27/2020   PT End of Session - 06/27/20 0819    Visit Number 4    Number of Visits 17    Date for PT Re-Evaluation 08/13/20    Authorization Type BCBS    PT Start Time 0800    PT Stop Time 0840    PT Time Calculation (min) 40 min    Activity Tolerance Patient tolerated treatment well    Behavior During Therapy [facility] for tasks assessed/performed           No past medical history on file.  Past Surgical History:  Procedure Laterality Date  . ORIF TIBIA PLATEAU Left 02/26/2020   Procedure: OPEN REDUCTION INTERNAL FIXATION (ORIF) TIBIAL PLATEAU;  Surgeon: Shona Needles, MD;  Location: [facility];  Service: Orthopedics;  Laterality: Left;    There were no vitals filed for this visit.   Subjective Assessment - 06/27/20 0756    Subjective worst time is just getting OOB 8-9/10. HEP going well- fearful at times. Established a new orthopedic MD for follow up - Dr [name], Appointment TBD    Currently in Pain? Yes    Pain Score 6     Pain Location Knee    Pain Orientation Right                             [facility] Adult PT Treatment/Exercise - 06/27/20 0806      Lumbar Exercises: Stretches   Active Hamstring Stretch Limitations Seated HS stretch 30" x 1 B    Quad Stretch Limitations Seated 30 seconds x 4 with strap      Lumbar Exercises: Aerobic   Nustep L 3 5 min with UE      Lumbar Exercises: Seated   LAQ on Chair Limitations 10 x 2 cues to prevent knee hyperext      Knee/Hip Exercises: Standing   Other Standing Knee Exercises Mini squats with BUE on walker  3 sets of 5. Marches with BUE on walker 2 sets of 5. Pregait WS with both hands on  walker 5 reps      Knee/Hip Exercises: Seated   Ball Squeeze 10 x 2 green ball    Clamshell with TheraBand Green    Hamstring Curl 2 sets;10 reps;Strengthening    Hamstring Limitations Yellow TB      Knee/Hip Exercises: Supine   Quad Sets Strengthening;Left;10 reps      Manual Therapy   Manual Therapy Soft tissue mobilization;Passive ROM    Soft tissue mobilization STM to distal knee, distal scar L knee- puckering/tightness noted at end of scar with some tenderness that improved post gentle STM, Patellar mobs. manual TPR to tenderpoints along distal vastus lateralis    Passive ROM AA/P knee ext/flexio with gentle end range holds      Ankle Exercises: Stretches   Other Stretch Gastro-sol  stretch with strap 20 sec x 2      Ankle Exercises: Seated   Heel Raises 20 reps                    PT Short Term Goals - 06/18/20 7782      PT  SHORT TERM GOAL #1   Title Independent with initial HEP    Time 2    Period Weeks    Status New    Target Date 07/02/20      PT SHORT TERM GOAL #2   Title Pt will complete at least 25 seconds with minimal sway for MCTSIB conditions 1 and 2 to facilitate progress with safe static standing balance    Time 4    Period Weeks    Status New    Target Date 07/16/20             PT Long Term Goals - 06/18/20 0918      PT LONG TERM GOAL #1   Title Independent with advanced HEP    Time 8    Period Weeks    Status New    Target Date 08/13/20      PT LONG TERM GOAL #2   Title Pt will achieve L Knee ROM 0-110 with </= 2/10 pain to facilitate safe functional mobility.    Time 8    Period Weeks    Status New    Target Date 08/13/20      PT LONG TERM GOAL #3   Title Pt will be able to ambulate with minimal asymmetries and no LOB with least restrictive assistive device (LRAD) with </= 1/10 pain .    Time 8    Period Weeks    Status New    Target Date 08/13/20      PT LONG TERM GOAL #4   Title Pt will report improved standing  tolerance with pain </= 1/10 and no LOB for cooking and hygeine activities in the home    Time 8    Period Weeks    Status New    Target Date 08/13/20      PT LONG TERM GOAL #5   Title BLE strength at least 4+/5    Time 8    Period Weeks    Status New    Target Date 08/13/20                 Plan - 06/27/20 0820    Clinical Impression Statement [name] tolerated exercises well today - no c/o any sharp pain.Willing and able to trial nustep at start of session today. Reports no big pain exacerbations, just increased effort with progressing exercises - feels like she cannot fully trust left leg. Able to progress to standing marches today and resisted knee flexion well. Continue per POC    Personal Factors and Comorbidities Comorbidity 3+    Comorbidities Obesity, HTN, L femur fx history, Debility    Examination-Activity Limitations Bathing;Bend;Caring for Others;Carry;Dressing;Hygiene/Grooming;Stand;Stairs;Squat;Locomotion Level;Transfers    Examination-Participation Restrictions Art gallery manager;Interpersonal Relationship;Meal Prep    Rehab Potential Fair    PT Frequency 2x / week    PT Duration 8 weeks    PT Treatment/Interventions ADLs/Self Care Home Management;Cryotherapy;Electrical Stimulation;Iontophoresis 4mg /ml Dexamethasone;Moist Heat;Gait training;DME Instruction;Neuromuscular re-education;Balance training;Therapeutic exercise;Therapeutic activities;Functional mobility training;Stair training;Patient/family education;Manual techniques;Energy conservation;Scar mobilization;Taping;Vasopneumatic Device    PT Next Visit Plan Initiate progressive LE TE and funcitonal mobility training, L knee ROM within painfree ranges, flexibility. WBAT LLE. Incorporate balance training, standing tolerance as tolerated. Progress stadning exercises as tolerated next visit.    PT Home Exercise Plan discussed adding desensitization using soft textures to begin to work on decr sensitivity in  the knee    Consulted and Agree with Plan of Care Patient  Patient will benefit from skilled therapeutic intervention in order to improve the following deficits and impairments:  Abnormal gait,Decreased range of motion,Obesity,Decreased endurance,Difficulty walking,Decreased activity tolerance,Pain,Improper body mechanics,Impaired flexibility,Decreased balance,Decreased mobility,Decreased strength,Increased edema  Visit Diagnosis: Pain  Stiffness of left knee, not elsewhere classified  Muscle weakness (generalized)  Left knee pain, unspecified chronicity  Pain in left hip  Unsteadiness on feet  Other abnormalities of gait and mobility  Abnormal posture  Closed fracture of left tibial plateau, sequela  Fracture  Closed fracture of right tibial plateau, initial encounter     Problem List Patient Active Problem List   Diagnosis Date Noted  . Debility 05/02/2020  . Urinary incontinence 05/02/2020  . Pain in left shin   . Urinary frequency   . Sleep disturbance   . Pain   . Anxiety state   . Urinary urgency   . Drug induced constipation   . Post-operative pain   . Benign essential HTN   . Pruritus   . Left medial tibial plateau fracture 03/01/2020  . Morbid obesity ([place])   . Acute blood loss anemia   . Postoperative pain   . Tibial plateau fracture 02/25/2020  . Tibial plateau fracture, left 02/25/2020  . Hyponatremia 02/25/2020  . Alcohol use disorder, moderate, dependence ([place]) 02/25/2020  . Essential hypertension 02/25/2020    Hall Busing, PT, DPT 06/27/2020, 8:46 AM  [place]. [place], [place], [address] Phone: [phone]   Fax:  [phone]  Name: [name] MRN: [id number] Date of Birth: 1956-02-08

## 2020-07-02 ENCOUNTER — Other Ambulatory Visit: Payer: Self-pay

## 2020-07-02 ENCOUNTER — Ambulatory Visit: Payer: BC Managed Care – PPO

## 2020-07-02 DIAGNOSIS — R293 Abnormal posture: Secondary | ICD-10-CM

## 2020-07-02 DIAGNOSIS — T148XXA Other injury of unspecified body region, initial encounter: Secondary | ICD-10-CM

## 2020-07-02 DIAGNOSIS — S82142S Displaced bicondylar fracture of left tibia, sequela: Secondary | ICD-10-CM

## 2020-07-02 DIAGNOSIS — R52 Pain, unspecified: Secondary | ICD-10-CM

## 2020-07-02 DIAGNOSIS — M25552 Pain in left hip: Secondary | ICD-10-CM

## 2020-07-02 DIAGNOSIS — M25662 Stiffness of left knee, not elsewhere classified: Secondary | ICD-10-CM

## 2020-07-02 DIAGNOSIS — M6281 Muscle weakness (generalized): Secondary | ICD-10-CM

## 2020-07-02 DIAGNOSIS — S82141A Displaced bicondylar fracture of right tibia, initial encounter for closed fracture: Secondary | ICD-10-CM

## 2020-07-02 DIAGNOSIS — R2689 Other abnormalities of gait and mobility: Secondary | ICD-10-CM

## 2020-07-02 DIAGNOSIS — R2681 Unsteadiness on feet: Secondary | ICD-10-CM

## 2020-07-02 DIAGNOSIS — M25562 Pain in left knee: Secondary | ICD-10-CM

## 2020-07-02 NOTE — Therapy (Signed)
[address] [place], [place], [address] Phone: [phone]   Fax:  [phone]  Physical Therapy Treatment  Patient Details  Name: [name] MRN: [id number] Date of Birth: 1955/07/26 Referring Provider (PT): Ankit Lorie Phenix, MD - PM&R   Encounter Date: 07/02/2020   PT End of Session - 07/02/20 0841    Visit Number 5    Number of Visits 17    Date for PT Re-Evaluation 08/13/20    Authorization Type BCBS    PT Start Time 0801    PT Stop Time 0841    PT Time Calculation (min) 40 min    Activity Tolerance Patient tolerated treatment well;Patient limited by pain    Behavior During Therapy [facility] for tasks assessed/performed           No past medical history on file.  Past Surgical History:  Procedure Laterality Date  . ORIF TIBIA PLATEAU Left 02/26/2020   Procedure: OPEN REDUCTION INTERNAL FIXATION (ORIF) TIBIAL PLATEAU;  Surgeon: Shona Needles, MD;  Location: [place];  Service: Orthopedics;  Laterality: Left;    There were no vitals filed for this visit.   Subjective Assessment - 07/02/20 0812    Subjective Awaiting appointment to b made with Dr Rich Fuchs office. Reports having increased pain over the weekend sharp at the left knee    Pertinent History L fx femur 2019. History of HTN. Obesity. Had COVID Feb 1st.    How long can you stand comfortably? no more than 3 minutes before increasing pain    How long can you walk comfortably? Using RW for short distances.    Diagnostic tests L hip CT 05/29/20: 1. Healed intertrochanteric femur fracture status post ORIF. No  hardware complication. 2. Mild right hip osteoarthritis.    Patient Stated Goals To be able to get out and about on her own, to try and walk without walker, stand and cook    Currently in Pain? Yes    Pain Score 7    decreased to 4/10 with sitting   Pain Location Knee    Pain Orientation Left             [facility] Adult PT Treatment/Exercise -  07/02/20 0001      Lumbar Exercises: Stretches   Active Hamstring Stretch Limitations Seated HS stretch 30" x 2 B   cues to prevent left knee hyperextension     Lumbar Exercises: Seated   LAQ on Chair Limitations 10 x 2 cues to prevent knee hyperext      Knee/Hip Exercises: Standing   Other Standing Knee Exercises Pregait WS with both hands on walker x10 reps      Knee/Hip Exercises: Seated   Ball Squeeze 10 x 2 green ball    Clamshell with TheraBand Green    Marching Both;2 sets   12   Hamstring Curl 2 sets;10 reps;Strengthening    Hamstring Limitations Yellow TB   partial range, within pain free range on the left     Knee/Hip Exercises: Supine   Quad Sets Strengthening;Left;10 reps      Manual Therapy   Manual Therapy Soft tissue mobilization;Passive ROM    Soft tissue mobilization STM to distal knee, distal scar L knee- puckering/tightness noted at end of scar with some tenderness that improved post gentle STM, Patellar mobs. manual TPR to tenderpoints along distal vastus lateralis    Passive ROM AA/P gentle  knee ext/flexion      Ankle Exercises:  Stretches   Other Stretch Gastro-sol  stretch with strap 60 sec                    PT Short Term Goals - 06/18/20 0918      PT SHORT TERM GOAL #1   Title Independent with initial HEP    Time 2    Period Weeks    Status New    Target Date 07/02/20      PT SHORT TERM GOAL #2   Title Pt will complete at least 25 seconds with minimal sway for MCTSIB conditions 1 and 2 to facilitate progress with safe static standing balance    Time 4    Period Weeks    Status New    Target Date 07/16/20             PT Long Term Goals - 06/18/20 0918      PT LONG TERM GOAL #1   Title Independent with advanced HEP    Time 8    Period Weeks    Status New    Target Date 08/13/20      PT LONG TERM GOAL #2   Title Pt will achieve L Knee ROM 0-110 with </= 2/10 pain to facilitate safe functional mobility.    Time 8    Period  Weeks    Status New    Target Date 08/13/20      PT LONG TERM GOAL #3   Title Pt will be able to ambulate with minimal asymmetries and no LOB with least restrictive assistive device (LRAD) with </= 1/10 pain .    Time 8    Period Weeks    Status New    Target Date 08/13/20      PT LONG TERM GOAL #4   Title Pt will report improved standing tolerance with pain </= 1/10 and no LOB for cooking and hygeine activities in the home    Time 8    Period Weeks    Status New    Target Date 08/13/20      PT LONG TERM GOAL #5   Title BLE strength at least 4+/5    Time 8    Period Weeks    Status New    Target Date 08/13/20                 Plan - 07/02/20 0842    Clinical Impression Statement Standing exercises were limited today due to reports of increased pain over the distal L knee - some areas of trigger points along distal medial and lateral quads noted which responded fairly to sustained pressure. Seated exercises tolerated well. Educated pt in discontinuing knee flexion/quad stretch in sitting if increasing any sharp pains. Also educated in preventing knee hyperextension/lock out to decrease compressive forces within the knee with some improvement in pain when instanding and with stretches in sitting. Plan to continue to work on gentle strengthening and ROM within painfree ranges but continue to monitor knee pain given history of fractures   Personal Factors and Comorbidities Comorbidity 3+    Comorbidities Obesity, HTN, L femur fx history, Debility    Examination-Activity Limitations Bathing;Bend;Caring for Others;Carry;Dressing;Hygiene/Grooming;Stand;Stairs;Squat;Locomotion Level;Transfers    Examination-Participation Restrictions Art gallery manager;Interpersonal Relationship;Meal Prep    Rehab Potential Fair    PT Frequency 2x / week    PT Duration 8 weeks    PT Treatment/Interventions ADLs/Self Care Home Management;Cryotherapy;Electrical Stimulation;Iontophoresis  4mg /ml Dexamethasone;Moist Heat;Gait training;DME Instruction;Neuromuscular re-education;Balance training;Therapeutic exercise;Therapeutic activities;Functional mobility  training;Stair training;Patient/family education;Manual techniques;Energy conservation;Scar mobilization;Taping;Vasopneumatic Device    PT Next Visit Plan Initiate progressive LE TE and funcitonal mobility training, L knee ROM within painfree ranges, flexibility. WBAT LLE. Incorporate balance training, standing tolerance as tolerated. Progress stadning exercises as tolerated next visit.    Consulted and Agree with Plan of Care Patient           Patient will benefit from skilled therapeutic intervention in order to improve the following deficits and impairments:  Abnormal gait,Decreased range of motion,Obesity,Decreased endurance,Difficulty walking,Decreased activity tolerance,Pain,Improper body mechanics,Impaired flexibility,Decreased balance,Decreased mobility,Decreased strength,Increased edema  Visit Diagnosis: Pain  Stiffness of left knee, not elsewhere classified  Muscle weakness (generalized)  Left knee pain, unspecified chronicity  Pain in left hip  Unsteadiness on feet  Other abnormalities of gait and mobility  Abnormal posture  Closed fracture of left tibial plateau, sequela  Fracture  Closed fracture of right tibial plateau, initial encounter     Problem List Patient Active Problem List   Diagnosis Date Noted  . Debility 05/02/2020  . Urinary incontinence 05/02/2020  . Pain in left shin   . Urinary frequency   . Sleep disturbance   . Pain   . Anxiety state   . Urinary urgency   . Drug induced constipation   . Post-operative pain   . Benign essential HTN   . Pruritus   . Left medial tibial plateau fracture 03/01/2020  . Morbid obesity ([place])   . Acute blood loss anemia   . Postoperative pain   . Tibial plateau fracture 02/25/2020  . Tibial plateau fracture, left 02/25/2020  .  Hyponatremia 02/25/2020  . Alcohol use disorder, moderate, dependence ([place]) 02/25/2020  . Essential hypertension 02/25/2020    Hall Busing, PT, DPT 07/02/2020, 10:26 AM  [address]. [place], [place], [address] Phone: [phone]   Fax:  [phone]  Name: Blanchie Zeleznik MRN: [id number] Date of Birth: [birth year]

## 2020-07-04 ENCOUNTER — Ambulatory Visit: Payer: BC Managed Care – PPO | Admitting: Physical Therapy

## 2020-07-04 ENCOUNTER — Other Ambulatory Visit: Payer: Self-pay | Admitting: Registered Nurse

## 2020-07-05 ENCOUNTER — Telehealth: Payer: Self-pay | Admitting: Registered Nurse

## 2020-07-05 NOTE — Telephone Encounter (Signed)
Return [name] call, she states she's taking the pregablin three times a day, she didn't realized Lyrica was the same medication.

## 2020-07-05 NOTE — Telephone Encounter (Signed)
Last clinic note states Lyrica 100 mg daily. Electronic request asks for 100 mg TID

## 2020-07-05 NOTE — Telephone Encounter (Signed)
PMP was Reviewed. Dr Posey Pronto note was reviewed;  His HPI stated she was unable to tolerate the  Lyrica, in assessment and Plan it stated Lyrica daily. This provider called Ms. Moncure, she's taking the Lyrica three times a day. Lyrica e-scribed. Will route this note to Dr Posey Pronto.

## 2020-07-09 ENCOUNTER — Ambulatory Visit: Payer: BC Managed Care – PPO

## 2020-07-09 ENCOUNTER — Other Ambulatory Visit: Payer: Self-pay

## 2020-07-09 DIAGNOSIS — T148XXA Other injury of unspecified body region, initial encounter: Secondary | ICD-10-CM

## 2020-07-09 DIAGNOSIS — M25562 Pain in left knee: Secondary | ICD-10-CM

## 2020-07-09 DIAGNOSIS — S82142S Displaced bicondylar fracture of left tibia, sequela: Secondary | ICD-10-CM

## 2020-07-09 DIAGNOSIS — M6281 Muscle weakness (generalized): Secondary | ICD-10-CM

## 2020-07-09 DIAGNOSIS — R2689 Other abnormalities of gait and mobility: Secondary | ICD-10-CM

## 2020-07-09 DIAGNOSIS — M25662 Stiffness of left knee, not elsewhere classified: Secondary | ICD-10-CM | POA: Diagnosis not present

## 2020-07-09 DIAGNOSIS — M25552 Pain in left hip: Secondary | ICD-10-CM

## 2020-07-09 DIAGNOSIS — R2681 Unsteadiness on feet: Secondary | ICD-10-CM

## 2020-07-09 DIAGNOSIS — R293 Abnormal posture: Secondary | ICD-10-CM

## 2020-07-09 DIAGNOSIS — R52 Pain, unspecified: Secondary | ICD-10-CM

## 2020-07-09 NOTE — Therapy (Addendum)
[address] [place], [place], [address] Phone: [phone]   Fax:  [phone]  Physical Therapy Treatment/ Progress Update  Progress Note Reporting Period 06/18/2020 to 07/09/2020  See note below for Objective Data and Assessment of Progress/Goals.     Patient Details  Name: [name] MRN: [id number] Date of Birth: 05-28-1955 Referring Provider (PT): Ankit Lorie Phenix, MD - PM&R   Encounter Date: 07/09/2020   PT End of Session - 07/09/20 0824    Visit Number 5   Number of Visits 17    Date for PT Re-Evaluation 08/13/20    Authorization Type BCBS    PT Start Time 0800    PT Stop Time 0838    PT Time Calculation (min) 38 min    Activity Tolerance Patient tolerated treatment well;Patient limited by pain    Behavior During Therapy [facility] for tasks assessed/performed           No past medical history on file.  Past Surgical History:  Procedure Laterality Date   ORIF TIBIA PLATEAU Left 02/26/2020   Procedure: OPEN REDUCTION INTERNAL FIXATION (ORIF) TIBIAL PLATEAU;  Surgeon: Shona Needles, MD;  Location: [facility];  Service: Orthopedics;  Laterality: Left;    There were no vitals filed for this visit.   Subjective Assessment - 07/09/20 0806    Subjective Has an appointment with Dr Griffin Basil today at 1 pm. Reports she notices she frequently locks out her knee when in standing. FMLA approved until June 1st .   Pertinent History L fx femur 2019. History of HTN. Obesity. Had COVID Feb 1st.    How long can you stand comfortably? no more than 3 minutes before increasing pain    How long can you walk comfortably? Using RW for short distances.    Diagnostic tests L hip CT 05/29/20: 1. Healed intertrochanteric femur fracture status post ORIF. No  hardware complication. 2. Mild right hip osteoarthritis.    Patient Stated Goals To be able to get out and about on her own, to try and walk without walker, stand and cook    Currently  in Pain? Yes    Pain Score 3  (incr to 7 with WB, end ranges)   Pain Location Knee    Pain Orientation Left    Pain Descriptors / Indicators Tightness;Aching   Occasional sharp into medial anterior lower knee, happens more with putin weight and bending knee             [facility] PT Assessment - 07/09/20 0001      AROM   Left Knee Extension 0   seated - will go into hyperext in standing   Left Knee Flexion 75   80 passive, taken in sitting             [facility] Adult PT Treatment/Exercise - 07/09/20 0001      Knee/Hip Exercises: Standing   Other Standing Knee Exercises Pregait WS with both hands on walker 5 reps    Other Standing Knee Exercises Minisquat - sit to stand from elevated seated surface 4sets x 5      Knee/Hip Exercises: Seated   Ball Squeeze 10 x 2 purple ball    Other Seated Knee/Hip Exercises seated quad sets (cues to prevent hyperextension) and HS sets 5" x 10 each    Marching Both;2 sets   12     Manual Therapy   Manual Therapy Soft tissue mobilization;Passive ROM    Soft  tissue mobilization STM to distal knee, distal scar L knee- puckering/tightness noted at end of scar. Patellar mobs. manual TPR to tenderpoints along VMO and distal vastus lateralis. Very TTP anteromedial distal knee    Passive ROM AA/P gentle  knee ext/flexion             PT Short Term Goals - 07/09/20 9381      PT SHORT TERM GOAL #1   Title Independent with initial HEP    Time 2    Period Weeks    Status Achieved    Target Date 07/02/20      PT SHORT TERM GOAL #2   Title Pt will complete at least 25 seconds with minimal sway for MCTSIB conditions 1 and 2 to facilitate progress with safe static standing balance    Time 4    Period Weeks    Status On-going    Target Date 07/16/20             PT Long Term Goals - 07/09/20 0810      PT LONG TERM GOAL #1   Title Independent with advanced HEP    Time 8    Period Weeks    Status On-going    Target Date 08/13/20      PT LONG  TERM GOAL #2   Title Pt will achieve L Knee ROM 0-110 with </= 2/10 pain to facilitate safe functional mobility.    Time 8    Period Weeks    Status On-going    Target Date 08/13/20      PT LONG TERM GOAL #3   Title Pt will be able to ambulate with minimal asymmetries and no LOB with least restrictive assistive device (LRAD) with </= 1/10 pain .    Time 8    Period Weeks    Status On-going   07/09/2020: 7/10 pain with ambulating with FWW, slightly improved upright posture with slightly less UE weight bearing.   Target Date 08/13/20      PT LONG TERM GOAL #4   Title Pt will report improved standing tolerance with pain </= 1/10 and no LOB for cooking and hygeine activities in the home    Time 8    Period Weeks    Status On-going   07/09/2020: continues to have decr standing tolerance with continued L knee pain   Target Date 08/13/20      PT LONG TERM GOAL #5   Title BLE strength at least 4+/5    Time 8    Period Weeks    Status On-going    Target Date 08/13/20                 Plan - 07/09/20 0824    Clinical Impression Statement [name] is making gradual progress with regards to overall strength and mobility, however she is still limited by intermittent sharp/shooting localized pain at the anteromedial distal knee which happens moreso with knee hyperextension(frequently locking out knees in standing) and knee flexion to end of available range (no improvement in knee flexion ROM at this time, ROM same as initial eval). Discussed heavily with patient avoidinh hyperextension in standing, and paying attention to motions that cause pain and she was  advised not too push into this type of pain with gentle exercises and stretches, pt was in agreement.  Gait is gradually improving with FWW, able to intermittently complete more of a step through pattern and much less forward trunk flexion and decreased WB through hands.  She shared a bit of worry regarding the sharp localized knee pain and she  was advised to discuss this with Dr Griffin Basil given her past history.    Personal Factors and Comorbidities Comorbidity 3+    Comorbidities Obesity, HTN, L femur fx history, Debility    Examination-Activity Limitations Bathing;Bend;Caring for Others;Carry;Dressing;Hygiene/Grooming;Stand;Stairs;Squat;Locomotion Level;Transfers    Examination-Participation Restrictions Art gallery manager;Interpersonal Relationship;Meal Prep    Rehab Potential Fair    PT Frequency 2x / week    PT Duration 8 weeks    PT Treatment/Interventions ADLs/Self Care Home Management;Cryotherapy;Electrical Stimulation;Iontophoresis 4mg /ml Dexamethasone;Moist Heat;Gait training;DME Instruction;Neuromuscular re-education;Balance training;Therapeutic exercise;Therapeutic activities;Functional mobility training;Stair training;Patient/family education;Manual techniques;Energy conservation;Scar mobilization;Taping;Vasopneumatic Device    PT Next Visit Plan Follow up regarding ortho visit with Dr Griffin Basil and determine if any changes must be made to PT POC. Gentle LE TE and funcitonal mobility training, L knee ROM within painfree ranges, flexibility. WBAT LLE. Incorporate balance training, standing tolerance as tolerated.    Consulted and Agree with Plan of Care Patient           Patient will benefit from skilled therapeutic intervention in order to improve the following deficits and impairments:  Abnormal gait,Decreased range of motion,Obesity,Decreased endurance,Difficulty walking,Decreased activity tolerance,Pain,Improper body mechanics,Impaired flexibility,Decreased balance,Decreased mobility,Decreased strength,Increased edema  Visit Diagnosis: Pain  Stiffness of left knee, not elsewhere classified  Muscle weakness (generalized)  Left knee pain, unspecified chronicity  Pain in left hip  Unsteadiness on feet  Other abnormalities of gait and mobility  Abnormal posture  Closed fracture of left tibial plateau,  sequela  Fracture     Problem List Patient Active Problem List   Diagnosis Date Noted   Debility 05/02/2020   Urinary incontinence 05/02/2020   Pain in left shin    Urinary frequency    Sleep disturbance    Pain    Anxiety state    Urinary urgency    Drug induced constipation    Post-operative pain    Benign essential HTN    Pruritus    Left medial tibial plateau fracture 03/01/2020   Morbid obesity ([place])    Acute blood loss anemia    Postoperative pain    Tibial plateau fracture 02/25/2020   Tibial plateau fracture, left 02/25/2020   Hyponatremia 02/25/2020   Alcohol use disorder, moderate, dependence ([place]) 02/25/2020   Essential hypertension 02/25/2020    Hall Busing, PT, DPT 07/09/2020, 8:58 AM  [address]. [place], [place], [address] Phone: [phone]   Fax:  [phone]  Name: [name] MRN: [id number] Date of Birth: [birth year]

## 2020-07-11 ENCOUNTER — Ambulatory Visit: Payer: BC Managed Care – PPO

## 2020-07-11 ENCOUNTER — Other Ambulatory Visit: Payer: Self-pay

## 2020-07-11 DIAGNOSIS — M6281 Muscle weakness (generalized): Secondary | ICD-10-CM

## 2020-07-11 DIAGNOSIS — R293 Abnormal posture: Secondary | ICD-10-CM

## 2020-07-11 DIAGNOSIS — M25662 Stiffness of left knee, not elsewhere classified: Secondary | ICD-10-CM

## 2020-07-11 DIAGNOSIS — R2681 Unsteadiness on feet: Secondary | ICD-10-CM

## 2020-07-11 DIAGNOSIS — T148XXA Other injury of unspecified body region, initial encounter: Secondary | ICD-10-CM

## 2020-07-11 DIAGNOSIS — S82142S Displaced bicondylar fracture of left tibia, sequela: Secondary | ICD-10-CM

## 2020-07-11 DIAGNOSIS — R2689 Other abnormalities of gait and mobility: Secondary | ICD-10-CM

## 2020-07-11 DIAGNOSIS — M25552 Pain in left hip: Secondary | ICD-10-CM

## 2020-07-11 DIAGNOSIS — M25562 Pain in left knee: Secondary | ICD-10-CM

## 2020-07-11 DIAGNOSIS — R52 Pain, unspecified: Secondary | ICD-10-CM

## 2020-07-11 NOTE — Therapy (Signed)
[place]. [place], [place], [address] Phone: [phone]   Fax:  [phone]  Physical Therapy Treatment  Patient Details  Name: [name] MRN: [id number] Date of Birth: Jul 12, 1955 Referring Provider (PT): Ankit Lorie Phenix, MD - PM&R   Encounter Date: 07/11/2020   PT End of Session - 07/11/20 0840    Visit Number 6    Number of Visits 17    Date for PT Re-Evaluation 08/13/20    Authorization Type BCBS    PT Start Time 0800    PT Stop Time 0839    PT Time Calculation (min) 39 min    Activity Tolerance Patient tolerated treatment well;Patient limited by pain    Behavior During Therapy [facility] for tasks assessed/performed           No past medical history on file.  Past Surgical History:  Procedure Laterality Date  . ORIF TIBIA PLATEAU Left 02/26/2020   Procedure: OPEN REDUCTION INTERNAL FIXATION (ORIF) TIBIAL PLATEAU;  Surgeon: Shona Needles, MD;  Location: [place];  Service: Orthopedics;  Laterality: Left;    There were no vitals filed for this visit.   Subjective Assessment - 07/11/20 0802    Subjective Saw Dr Griffin Basil and got steroid injection.  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 Will be following up with Dr Griffin Basil in 6 weeks.    Pertinent History L fx femur 2019. History of HTN. Obesity. Had COVID Feb 1st.    How long can you stand comfortably? no more than 3 minutes before increasing pain    How long can you walk comfortably? Using RW for short distances.    Diagnostic tests L hip CT 05/29/20: 1. Healed intertrochanteric femur fracture status post ORIF. No  hardware complication. 2. Mild right hip osteoarthritis.    Patient Stated Goals To be abe to get out and about on her own, to try and walk without walker, stand and cook    Currently in Pain? Yes    Pain Score 3     Pain Location Knee    Pain Orientation Left    Pain Descriptors / Indicators  Tightness              [facility] PT Assessment - 07/11/20 0001      Timed Up and Go Test   Normal TUG (seconds) 17   FWW             [facility] Adult PT Treatment/Exercise - 07/11/20 0001      Ambulation/Gait   Ambulation Distance (Feet) 100 Feet    Assistive device Rolling walker    Gait Pattern Step-to pattern;Step-through pattern;Decreased arm swing - right;Decreased arm swing - left;Decreased step length - left;Decreased stance time - left;Decreased step length - right;Decreased hip/knee flexion - left;Decreased weight shift to left    Ambulation Surface Level;Indoor      High Level Balance   High Level Balance Activities Side stepping      Knee/Hip Exercises: Standing   Other Standing Knee Exercises Alternating step taps with 4"  step taps 10.  Sit to stand from elevated seated surface 4sets x 5      Knee/Hip Exercises: Seated   Long Arc Quad AROM;2 sets;10 reps;Both    Ball Squeeze 10 x 2 purple ball    Marching Both;2 sets   12 reps   Marching Weights 1 lbs.    Hamstring Curl 2 sets;10  reps;Strengthening;Both    Hamstring Limitations Yellow TB                    PT Short Term Goals - 07/09/20 7902      PT SHORT TERM GOAL #1   Title Independent with initial HEP    Time 2    Period Weeks    Status Achieved    Target Date 07/02/20      PT SHORT TERM GOAL #2   Title Pt will complete at least 25 seconds with minimal sway for MCTSIB conditions 1 and 2 to facilitate progress with safe static standing balance    Time 4    Period Weeks    Status On-going    Target Date 07/16/20             PT Long Term Goals - 07/09/20 0810      PT LONG TERM GOAL #1   Title Independent with advanced HEP    Time 8    Period Weeks    Status On-going    Target Date 08/13/20      PT LONG TERM GOAL #2   Title Pt will achieve L Knee ROM 0-110 with </= 2/10 pain to facilitate safe functional mobility.    Time 8    Period Weeks    Status On-going    Target Date  08/13/20      PT LONG TERM GOAL #3   Title Pt will be able to ambulate with minimal asymmetries and no LOB with least restrictive assistive device (LRAD) with </= 1/10 pain .    Time 8    Period Weeks    Status On-going   07/09/2020: 7/10 pain with ambulating with FWW, slightly improved upright posture with slightly less UE weight bearing.   Target Date 08/13/20      PT LONG TERM GOAL #4   Title Pt will report improved standing tolerance with pain </= 1/10 and no LOB for cooking and hygeine activities in the home    Time 8    Period Weeks    Status On-going   07/09/2020: continues to have decr standing tolerance with continued L knee pain   Target Date 08/13/20      PT LONG TERM GOAL #5   Title BLE strength at least 4+/5    Time 8    Period Weeks    Status On-going    Target Date 08/13/20                 Plan - 07/11/20 0825    Clinical Impression Statement After follow up with Dr Griffin Basil and reassurance that fracture is still healing well Dayanis was much more at ease today. No sharp pains reported t/o session. Reassessed TUG today with decrease from 43 seconds to 17 seconds with FWW. We were able to trial a longer distance walk within clinic today with FWW with increased lateral trunk lean and fatigue at end of lap but improved upright posture and gait speed. Able to trial some sidestepping in  bars with good tolerance but some incr LBP, anterior L ankle discomfort - reviewed FF stretch with pball  and ankle ROM 4 way for the Left ankle with good tolerance.    Personal Factors and Comorbidities Comorbidity 3+    Comorbidities Obesity, HTN, L femur fx history, Debility, chronic LBP    Examination-Activity Limitations Bathing;Bend;Caring for Others;Carry;Dressing;Hygiene/Grooming;Stand;Stairs;Squat;Locomotion Level;Transfers    Examination-Participation Restrictions Art gallery manager;Interpersonal Relationship;Meal Prep    Rehab  Potential Fair    PT Frequency 2x / week     PT Duration 8 weeks    PT Treatment/Interventions ADLs/Self Care Home Management;Cryotherapy;Electrical Stimulation;Iontophoresis 4mg /ml Dexamethasone;Moist Heat;Gait training;DME Instruction;Neuromuscular re-education;Balance training;Therapeutic exercise;Therapeutic activities;Functional mobility training;Stair training;Patient/family education;Manual techniques;Energy conservation;Scar mobilization;Taping;Vasopneumatic Device    PT Next Visit Plan Follow up regarding ortho visit with Dr Griffin Basil and determine if any changes must be made to PT POC. Gentle LE TE and funcitonal mobility training, L knee ROM within painfree ranges, flexibility. WBAT LLE. Incorporate balance training, standing tolerance as tolerated.    PT Home Exercise Plan Added [id number]    Consulted and Agree with Plan of Care Patient           Patient will benefit from skilled therapeutic intervention in order to improve the following deficits and impairments:  Abnormal gait,Decreased range of motion,Obesity,Decreased endurance,Difficulty walking,Decreased activity tolerance,Pain,Improper body mechanics,Impaired flexibility,Decreased balance,Decreased mobility,Decreased strength,Increased edema  Visit Diagnosis: Pain  Stiffness of left knee, not elsewhere classified  Muscle weakness (generalized)  Left knee pain, unspecified chronicity  Pain in left hip  Unsteadiness on feet  Other abnormalities of gait and mobility  Abnormal posture  Closed fracture of left tibial plateau, sequela  Fracture     Problem List Patient Active Problem List   Diagnosis Date Noted  . Debility 05/02/2020  . Urinary incontinence 05/02/2020  . Pain in left shin   . Urinary frequency   . Sleep disturbance   . Pain   . Anxiety state   . Urinary urgency   . Drug induced constipation   . Post-operative pain   . Benign essential HTN   . Pruritus   . Left medial tibial plateau fracture 03/01/2020  . Morbid obesity ([place])   .  Acute blood loss anemia   . Postoperative pain   . Tibial plateau fracture 02/25/2020  . Tibial plateau fracture, left 02/25/2020  . Hyponatremia 02/25/2020  . Alcohol use disorder, moderate, dependence ([place]) 02/25/2020  . Essential hypertension 02/25/2020    Hall Busing, PT, DPT 07/11/2020, 8:45 AM  [place]. [place], [place], [address] Phone: [phone]   Fax:  [phone]  Name: Elizibeth Breau MRN: [id number] Date of Birth: [birth year]

## 2020-07-15 ENCOUNTER — Other Ambulatory Visit: Payer: Self-pay

## 2020-07-15 ENCOUNTER — Encounter: Payer: Self-pay | Admitting: Physical Therapy

## 2020-07-15 ENCOUNTER — Ambulatory Visit: Payer: BC Managed Care – PPO | Admitting: Physical Therapy

## 2020-07-15 DIAGNOSIS — R52 Pain, unspecified: Secondary | ICD-10-CM

## 2020-07-15 DIAGNOSIS — M6281 Muscle weakness (generalized): Secondary | ICD-10-CM

## 2020-07-15 DIAGNOSIS — M25562 Pain in left knee: Secondary | ICD-10-CM

## 2020-07-15 DIAGNOSIS — M25662 Stiffness of left knee, not elsewhere classified: Secondary | ICD-10-CM

## 2020-07-15 DIAGNOSIS — M25552 Pain in left hip: Secondary | ICD-10-CM

## 2020-07-15 NOTE — Therapy (Signed)
[place]. [place], [place], [address] Phone: [phone]   Fax:  [phone]  Physical Therapy Treatment  Patient Details  Name: [name] MRN: [id number] Date of Birth: July 12, 1955 Referring Provider (PT): Ankit Lorie Phenix, MD - PM&R   Encounter Date: 07/15/2020   PT End of Session - 07/15/20 1524    Visit Number 7    Number of Visits 17    Date for PT Re-Evaluation 08/13/20    PT Start Time 6812    PT Stop Time 1526    PT Time Calculation (min) 42 min    Activity Tolerance Patient tolerated treatment well;Patient limited by pain    Behavior During Therapy [facility] for tasks assessed/performed           History reviewed. No pertinent past medical history.  Past Surgical History:  Procedure Laterality Date  . ORIF TIBIA PLATEAU Left 02/26/2020   Procedure: OPEN REDUCTION INTERNAL FIXATION (ORIF) TIBIAL PLATEAU;  Surgeon: Shona Needles, MD;  Location: [place];  Service: Orthopedics;  Laterality: Left;    There were no vitals filed for this visit.   Subjective Assessment - 07/15/20 1457    Subjective Pt reports that she was able to climb 4 stairs into friend's house this rx and stand for prolonged periods while cooking this weekend. A little extra sore today.    Currently in Pain? Yes    Pain Score 4     Pain Location Knee    Pain Orientation Left                             [facility] Adult PT Treatment/Exercise - 07/15/20 0001      Knee/Hip Exercises: Stretches   Knee: Self-Stretch to increase Flexion 4 reps;20 seconds;Left      Knee/Hip Exercises: Aerobic   Nustep L4 x 6.5 min      Knee/Hip Exercises: Standing   Other Standing Knee Exercises x5 4" step up RLE, x3 4" step up LLE    Other Standing Knee Exercises Alternating step taps with 4"  step taps 10.  Sit to stand from elevated seated surface 2sets x 5 and 1 set 10 with UEs on thighs      Knee/Hip Exercises: Seated   Long Arc  Quad AROM;2 sets;10 reps;Both    Ball Squeeze x20 with 3 sec hold    Other Seated Knee/Hip Exercises heel raises/toe raises x20    Marching Both;2 sets;10 reps    Hamstring Curl 2 sets;10 reps;Strengthening;Both    Hamstring Limitations red tb                    PT Short Term Goals - 07/09/20 7517      PT SHORT TERM GOAL #1   Title Independent with initial HEP    Time 2    Period Weeks    Status Achieved    Target Date 07/02/20      PT SHORT TERM GOAL #2   Title Pt will complete at least 25 seconds with minimal sway for MCTSIB conditions 1 and 2 to facilitate progress with safe static standing balance    Time 4    Period Weeks    Status On-going    Target Date 07/16/20             PT Long Term Goals - 07/09/20 0810      PT LONG TERM GOAL #  1   Title Independent with advanced HEP    Time 8    Period Weeks    Status On-going    Target Date 08/13/20      PT LONG TERM GOAL #2   Title Pt will achieve L Knee ROM 0-110 with </= 2/10 pain to facilitate safe functional mobility.    Time 8    Period Weeks    Status On-going    Target Date 08/13/20      PT LONG TERM GOAL #3   Title Pt will be able to ambulate with minimal asymmetries and no LOB with least restrictive assistive device (LRAD) with </= 1/10 pain .    Time 8    Period Weeks    Status On-going   07/09/2020: 7/10 pain with ambulating with FWW, slightly improved upright posture with slightly less UE weight bearing.   Target Date 08/13/20      PT LONG TERM GOAL #4   Title Pt will report improved standing tolerance with pain </= 1/10 and no LOB for cooking and hygeine activities in the home    Time 8    Period Weeks    Status On-going   07/09/2020: continues to have decr standing tolerance with continued L knee pain   Target Date 08/13/20      PT LONG TERM GOAL #5   Title BLE strength at least 4+/5    Time 8    Period Weeks    Status On-going    Target Date 08/13/20                 Plan  - 07/15/20 1525    Clinical Impression Statement Pt reporting increased L knee tightness at beginning of session following a busy weekend. Did well with all ex's today; able to perform STS pushing up from thighs on raised mat table. Step ups to 4" stair x5 on RLE and x3 on LLE with discomfort/fear to fully WB. Continue to progress to tolerance.    PT Treatment/Interventions ADLs/Self Care Home Management;Cryotherapy;Electrical Stimulation;Iontophoresis 4mg /ml Dexamethasone;Moist Heat;Gait training;DME Instruction;Neuromuscular re-education;Balance training;Therapeutic exercise;Therapeutic activities;Functional mobility training;Stair training;Patient/family education;Manual techniques;Energy conservation;Scar mobilization;Taping;Vasopneumatic Device    PT Next Visit Plan Follow up regarding ortho visit with Dr Griffin Basil and determine if any changes must be made to PT POC. Gentle LE TE and funcitonal mobility training, L knee ROM within painfree ranges, flexibility. WBAT LLE. Incorporate balance training, standing tolerance as tolerated.    Consulted and Agree with Plan of Care Patient           Patient will benefit from skilled therapeutic intervention in order to improve the following deficits and impairments:  Abnormal gait,Decreased range of motion,Obesity,Decreased endurance,Difficulty walking,Decreased activity tolerance,Pain,Improper body mechanics,Impaired flexibility,Decreased balance,Decreased mobility,Decreased strength,Increased edema  Visit Diagnosis: Pain  Stiffness of left knee, not elsewhere classified  Muscle weakness (generalized)  Left knee pain, unspecified chronicity  Pain in left hip     Problem List Patient Active Problem List   Diagnosis Date Noted  . Debility 05/02/2020  . Urinary incontinence 05/02/2020  . Pain in left shin   . Urinary frequency   . Sleep disturbance   . Pain   . Anxiety state   . Urinary urgency   . Drug induced constipation   .  Post-operative pain   . Benign essential HTN   . Pruritus   . Left medial tibial plateau fracture 03/01/2020  . Morbid obesity ([place])   . Acute blood loss anemia   . Postoperative  pain   . Tibial plateau fracture 02/25/2020  . Tibial plateau fracture, left 02/25/2020  . Hyponatremia 02/25/2020  . Alcohol use disorder, moderate, dependence ([place]) 02/25/2020  . Essential hypertension 02/25/2020   Amador Cunas, PT, DPT Donald Prose Vihan Santagata 07/15/2020, 3:27 PM  [place]. [place], [place], [address] Phone: [phone]   Fax:  [phone]  Name: Naviyah Schaffert MRN: [id number] Date of Birth: [birth year]

## 2020-07-18 ENCOUNTER — Ambulatory Visit: Payer: BC Managed Care – PPO

## 2020-07-23 ENCOUNTER — Other Ambulatory Visit: Payer: Self-pay

## 2020-07-23 ENCOUNTER — Ambulatory Visit: Payer: BC Managed Care – PPO | Attending: Physical Medicine & Rehabilitation

## 2020-07-23 DIAGNOSIS — R2681 Unsteadiness on feet: Secondary | ICD-10-CM

## 2020-07-23 DIAGNOSIS — S82142S Displaced bicondylar fracture of left tibia, sequela: Secondary | ICD-10-CM | POA: Insufficient documentation

## 2020-07-23 DIAGNOSIS — T148XXA Other injury of unspecified body region, initial encounter: Secondary | ICD-10-CM | POA: Insufficient documentation

## 2020-07-23 DIAGNOSIS — M25552 Pain in left hip: Secondary | ICD-10-CM

## 2020-07-23 DIAGNOSIS — M25662 Stiffness of left knee, not elsewhere classified: Secondary | ICD-10-CM

## 2020-07-23 DIAGNOSIS — R52 Pain, unspecified: Secondary | ICD-10-CM

## 2020-07-23 DIAGNOSIS — R293 Abnormal posture: Secondary | ICD-10-CM | POA: Diagnosis present

## 2020-07-23 DIAGNOSIS — R2689 Other abnormalities of gait and mobility: Secondary | ICD-10-CM

## 2020-07-23 DIAGNOSIS — M25562 Pain in left knee: Secondary | ICD-10-CM | POA: Diagnosis present

## 2020-07-23 DIAGNOSIS — M6281 Muscle weakness (generalized): Secondary | ICD-10-CM

## 2020-07-23 DIAGNOSIS — S82141A Displaced bicondylar fracture of right tibia, initial encounter for closed fracture: Secondary | ICD-10-CM | POA: Diagnosis present

## 2020-07-23 NOTE — Therapy (Signed)
[place]. [place], [place], [address] Phone: [phone]   Fax:  [phone]  Physical Therapy Treatment  Patient Details  Name: [name] MRN: [id number] Date of Birth: 1956-02-22 Referring Provider (PT): Ankit Lorie Phenix, MD - PM&R   Encounter Date: 07/23/2020   PT End of Session - 07/23/20 0809    Visit Number 8    Number of Visits 17    Date for PT Re-Evaluation 08/13/20    Authorization Type BCBS    PT Start Time 0800    PT Stop Time 0840    PT Time Calculation (min) 40 min    Activity Tolerance Patient tolerated treatment well;Patient limited by pain    Behavior During Therapy [facility] for tasks assessed/performed           History reviewed. No pertinent past medical history.  Past Surgical History:  Procedure Laterality Date  . ORIF TIBIA PLATEAU Left 02/26/2020   Procedure: OPEN REDUCTION INTERNAL FIXATION (ORIF) TIBIAL PLATEAU;  Surgeon: Shona Needles, MD;  Location: [place];  Service: Orthopedics;  Laterality: Left;    There were no vitals filed for this visit.   Subjective Assessment - 07/23/20 0804    Subjective Feels like no longer gettng as much sharp knee pain but definitiely starting to feel more aches/soreness in the left leg, hip and right low back/hip    Pertinent History L fx femur 2019. History of HTN. Obesity. Had COVID Feb 1st.    How long can you stand comfortably? no more than 3 minutes before increasing pain    How long can you walk comfortably? Using RW for short distances.    Diagnostic tests L hip CT 05/29/20: 1. Healed intertrochanteric femur fracture status post ORIF. No  hardware complication. 2. Mild right hip osteoarthritis.    Patient Stated Goals To be abe to get out and about on her own, to try and walk without walker, stand and cook    Currently in Pain? Yes                 [facility] Adult PT Treatment/Exercise - 07/23/20 0001      Ambulation/Gait   Stairs Yes     Stair Management Technique --   4" and 6" steps: R lead ascend with B HR, L lead descend with BUE on 1 HR; L lead ascend step to with BHR, descend R lead with BUE on 1 HR.   Number of Stairs --   3 repetitions of 4" steps x 3. 2 repetitions of 6" steps x 2   Gait Comments Short distance walk without AD - overall fair stability and no LOB but alot of excess lateral trunk lean bilaterally      High Level Balance   High Level Balance Activities Side stepping;Marching forwards;Marching backwards   BUE supported in  bars     Lumbar Exercises: Standing   Other Standing Lumbar Exercises Standing lateral toe taps 5x 2 B with BUE support on bars      Lumbar Exercises: Seated   Other Seated Lumbar Exercises horizontal abd x 5, rows x 10  with yellow TB      Knee/Hip Exercises: Stretches   Knee: Self-Stretch to increase Flexion 4 reps;20 seconds;Left      Knee/Hip Exercises: Aerobic   Nustep L4 x 7 minutes      Knee/Hip Exercises: Standing   Other Standing Knee Exercises --    Other Standing Knee Exercises --  Knee/Hip Exercises: Seated   Long Arc Quad AROM;2 sets;Both;15 reps    [facility] x20 with 3 sec hold    Other Seated Knee/Hip Exercises --    Marching Both;2 sets;15 reps    Marching Weights 1 lbs.    Hamstring Curl 2 sets;Strengthening;Both;15 reps    Hamstring Limitations red tb                    PT Short Term Goals - 07/09/20 9390      PT SHORT TERM GOAL #1   Title Independent with initial HEP    Time 2    Period Weeks    Status Achieved    Target Date 07/02/20      PT SHORT TERM GOAL #2   Title Pt will complete at least 25 seconds with minimal sway for MCTSIB conditions 1 and 2 to facilitate progress with safe static standing balance    Time 4    Period Weeks    Status On-going    Target Date 07/16/20             PT Long Term Goals - 07/09/20 0810      PT LONG TERM GOAL #1   Title Independent with advanced HEP    Time 8    Period Weeks     Status On-going    Target Date 08/13/20      PT LONG TERM GOAL #2   Title Pt will achieve L Knee ROM 0-110 with </= 2/10 pain to facilitate safe functional mobility.    Time 8    Period Weeks    Status On-going    Target Date 08/13/20      PT LONG TERM GOAL #3   Title Pt will be able to ambulate with minimal asymmetries and no LOB with least restrictive assistive device (LRAD) with </= 1/10 pain .    Time 8    Period Weeks    Status On-going   07/09/2020: 7/10 pain with ambulating with FWW, slightly improved upright posture with slightly less UE weight bearing.   Target Date 08/13/20      PT LONG TERM GOAL #4   Title Pt will report improved standing tolerance with pain </= 1/10 and no LOB for cooking and hygeine activities in the home    Time 8    Period Weeks    Status On-going   07/09/2020: continues to have decr standing tolerance with continued L knee pain   Target Date 08/13/20      PT LONG TERM GOAL #5   Title BLE strength at least 4+/5    Time 8    Period Weeks    Status On-going    Target Date 08/13/20                 Plan - 07/23/20 0810    Clinical Impression Statement [name] continues to do well with exercises. She reported she is planning going to a sporting outing  this weekend where she may have to do stairs - educated on importance of practice all scenarios and willing to practice stairs in session. Stairs were effortful and tiring but she was able to complete with increased time using side stepping technique to descend. Incorporated some mid back strengthening end of session today as she reported shoulders get tired - frequently putting alot of weight on BUE - educated on how strengthening scap sstailizers and mid back are helpful for shoudler protection.    Personal  Factors and Comorbidities Comorbidity 3+    Comorbidities Obesity, HTN, L femur fx history, Debility, chronic LBP    Examination-Activity Limitations Bathing;Bend;Caring for  Others;Carry;Dressing;Hygiene/Grooming;Stand;Stairs;Squat;Locomotion Level;Transfers    Examination-Participation Restrictions Art gallery manager;Interpersonal Relationship;Meal Prep    Rehab Potential Fair    PT Frequency 2x / week    PT Duration 8 weeks    PT Treatment/Interventions ADLs/Self Care Home Management;Cryotherapy;Electrical Stimulation;Iontophoresis 4mg /ml Dexamethasone;Moist Heat;Gait training;DME Instruction;Neuromuscular re-education;Balance training;Therapeutic exercise;Therapeutic activities;Functional mobility training;Stair training;Patient/family education;Manual techniques;Energy conservation;Scar mobilization;Taping;Vasopneumatic Device    PT Next Visit Plan Gentle LE TE and funcitonal mobility training, L knee ROM within painfree ranges, flexibility. WBAT LLE. Incorporate balance training, standing tolerance as tolerated.    Consulted and Agree with Plan of Care Patient           Patient will benefit from skilled therapeutic intervention in order to improve the following deficits and impairments:  Abnormal gait,Decreased range of motion,Obesity,Decreased endurance,Difficulty walking,Decreased activity tolerance,Pain,Improper body mechanics,Impaired flexibility,Decreased balance,Decreased mobility,Decreased strength,Increased edema  Visit Diagnosis: Pain  Stiffness of left knee, not elsewhere classified  Muscle weakness (generalized)  Left knee pain, unspecified chronicity  Pain in left hip  Unsteadiness on feet  Other abnormalities of gait and mobility  Abnormal posture     Problem List Patient Active Problem List   Diagnosis Date Noted  . Debility 05/02/2020  . Urinary incontinence 05/02/2020  . Pain in left shin   . Urinary frequency   . Sleep disturbance   . Pain   . Anxiety state   . Urinary urgency   . Drug induced constipation   . Post-operative pain   . Benign essential HTN   . Pruritus   . Left medial tibial plateau fracture  03/01/2020  . Morbid obesity ([place])   . Acute blood loss anemia   . Postoperative pain   . Tibial plateau fracture 02/25/2020  . Tibial plateau fracture, left 02/25/2020  . Hyponatremia 02/25/2020  . Alcohol use disorder, moderate, dependence ([place]) 02/25/2020  . Essential hypertension 02/25/2020    Hall Busing, PT, DPT 07/23/2020, 9:27 AM  [place]. [place], [place], [address] Phone: [phone]   Fax:  [phone]  Name: [name] MRN: [id number] Date of Birth: 1955-11-08

## 2020-07-25 ENCOUNTER — Ambulatory Visit: Payer: BC Managed Care – PPO | Admitting: Physical Medicine & Rehabilitation

## 2020-07-25 ENCOUNTER — Ambulatory Visit: Payer: BC Managed Care – PPO

## 2020-07-25 ENCOUNTER — Other Ambulatory Visit: Payer: Self-pay

## 2020-07-25 DIAGNOSIS — R2681 Unsteadiness on feet: Secondary | ICD-10-CM

## 2020-07-25 DIAGNOSIS — R52 Pain, unspecified: Secondary | ICD-10-CM

## 2020-07-25 DIAGNOSIS — M25562 Pain in left knee: Secondary | ICD-10-CM

## 2020-07-25 DIAGNOSIS — R293 Abnormal posture: Secondary | ICD-10-CM

## 2020-07-25 DIAGNOSIS — R2689 Other abnormalities of gait and mobility: Secondary | ICD-10-CM

## 2020-07-25 DIAGNOSIS — M25552 Pain in left hip: Secondary | ICD-10-CM

## 2020-07-25 DIAGNOSIS — M25662 Stiffness of left knee, not elsewhere classified: Secondary | ICD-10-CM

## 2020-07-25 DIAGNOSIS — S82141A Displaced bicondylar fracture of right tibia, initial encounter for closed fracture: Secondary | ICD-10-CM

## 2020-07-25 DIAGNOSIS — S82142S Displaced bicondylar fracture of left tibia, sequela: Secondary | ICD-10-CM

## 2020-07-25 DIAGNOSIS — T148XXA Other injury of unspecified body region, initial encounter: Secondary | ICD-10-CM

## 2020-07-25 DIAGNOSIS — M6281 Muscle weakness (generalized): Secondary | ICD-10-CM

## 2020-07-25 NOTE — Therapy (Signed)
[place]. [place], [place], [address] Phone: [phone]   Fax:  [phone]  Physical Therapy Treatment  Patient Details  Name: [name] MRN: [id number] Date of Birth: 1955-06-06 Referring Provider (PT): Ankit Lorie Phenix, MD - PM&R   Encounter Date: 07/25/2020   PT End of Session - 07/25/20 1126    Visit Number 9    Number of Visits 17    Date for PT Re-Evaluation 08/13/20    Authorization Type BC[employer]    PT Start Time 1100    PT Stop Time 1145    PT Time Calculation (min) 45 min    Activity Tolerance Patient tolerated treatment well;Patient limited by pain    Behavior During Therapy [facility] for tasks assessed/performed           History reviewed. No pertinent past medical history.  Past Surgical History:  Procedure Laterality Date  . ORIF TIBIA PLATEAU Left 02/26/2020   Procedure: OPEN REDUCTION INTERNAL FIXATION (ORIF) TIBIAL PLATEAU;  Surgeon: Shona Needles, MD;  Location: [facility];  Service: Orthopedics;  Laterality: Left;    There were no vitals filed for this visit.   Subjective Assessment - 07/25/20 1108    Subjective Feeling stiff this morning    Pertinent History L fx femur 2019. History of HTN. Obesity. Had COVID Feb 1st.    How long can you stand comfortably? no more than 3 minutes before increasing pain    How long can you walk comfortably? Using RW for short distances.    Diagnostic tests L hip CT 05/29/20: 1. Healed intertrochanteric femur fracture status post ORIF. No  hardware complication. 2. Mild right hip osteoarthritis.    Patient Stated Goals To be abe to get out and about on her own, to try and walk without walker, stand and cook    Currently in Pain? Yes    Pain Score 4     Pain Location Knee    Pain Orientation Left    Pain Descriptors / Indicators --   stiff             [facility] Adult PT Treatment/Exercise - 07/25/20 0001      Ambulation/Gait   Ambulation Distance (Feet) 20 Feet    x 3   Assistive device --   left quad cane   Gait Pattern Step-to pattern;Step-through pattern;Decreased arm swing - right;Decreased arm swing - left;Decreased step length - left;Decreased stance time - left    Ambulation Surface Level;Indoor    Stairs Yes    Stair Management Technique --   4" " steps: R lead ascend with B HR, L lead descend with BUE on 1 HR; L lead ascend step to with BHR, descend R lead with BUE on 1 HR   Number of Stairs --   4 repetitions for 3, 4" steps     Knee/Hip Exercises: Aerobic   Nustep L5 x 5 minutes      Knee/Hip Exercises: Seated   Long Arc Quad AROM;2 sets;Both;15 reps    Long Arc Quad Weight 2 lbs.    Ball Squeeze x20 with 3 sec hold    Marching Both;2 sets;15 reps    Marching Weights 2 lbs.    Hamstring Curl 2 sets;Strengthening;Both;15 reps    Hamstring Limitations red tb    Abduction/Adduction  Strengthening;Both;2 sets;10 reps    Abd/Adduction Limitations green    Sit to Sand 5 reps   3 sets, hands on thighs rom mat  table                   PT Short Term Goals - 07/09/20 1610      PT SHORT TERM GOAL #1   Title Independent with initial HEP    Time 2    Period Weeks    Status Achieved    Target Date 07/02/20      PT SHORT TERM GOAL #2   Title Pt will complete at least 25 seconds with minimal sway for MCTSIB conditions 1 and 2 to facilitate progress with safe static standing balance    Time 4    Period Weeks    Status On-going    Target Date 07/16/20             PT Long Term Goals - 07/09/20 0810      PT LONG TERM GOAL #1   Title Independent with advanced HEP    Time 8    Period Weeks    Status On-going    Target Date 08/13/20      PT LONG TERM GOAL #2   Title Pt will achieve L Knee ROM 0-110 with </= 2/10 pain to facilitate safe functional mobility.    Time 8    Period Weeks    Status On-going    Target Date 08/13/20      PT LONG TERM GOAL #3   Title Pt will be able to ambulate with minimal asymmetries and  no LOB with least restrictive assistive device (LRAD) with </= 1/10 pain .    Time 8    Period Weeks    Status On-going   07/09/2020: 7/10 pain with ambulating with FWW, slightly improved upright posture with slightly less UE weight bearing.   Target Date 08/13/20      PT LONG TERM GOAL #4   Title Pt will report improved standing tolerance with pain </= 1/10 and no LOB for cooking and hygeine activities in the home    Time 8    Period Weeks    Status On-going   07/09/2020: continues to have decr standing tolerance with continued L knee pain   Target Date 08/13/20      PT LONG TERM GOAL #5   Title BLE strength at least 4+/5    Time 8    Period Weeks    Status On-going    Target Date 08/13/20                 Plan - 07/25/20 1128    Clinical Impression Statement [name] continues to do well with exercises. She tolerated progressions of resistance exercises well, just needed rest breaks for intermittent fatigue management. We progressed to some short distance ambulation with quad cane with good tolerance and no LOB although slow. We spoke about the quad cane she uses at home sometimes, she may bring it in to next therapy session to practice with    Personal Factors and Comorbidities Comorbidity 3+    Comorbidities Obesity, HTN, L femur fx history, Debility, chronic LBP    Examination-Activity Limitations Bathing;Bend;Caring for Others;Carry;Dressing;Hygiene/Grooming;Stand;Stairs;Squat;Locomotion Level;Transfers    Examination-Participation Restrictions Art gallery manager;Interpersonal Relationship;Meal Prep    Rehab Potential Fair    PT Frequency 2x / week    PT Duration 8 weeks    PT Treatment/Interventions ADLs/Self Care Home Management;Cryotherapy;Electrical Stimulation;Iontophoresis 4mg /ml Dexamethasone;Moist Heat;Gait training;DME Instruction;Neuromuscular re-education;Balance training;Therapeutic exercise;Therapeutic activities;Functional mobility training;Stair  training;Patient/family education;Manual techniques;Energy conservation;Scar mobilization;Taping;Vasopneumatic Device    PT Next Visit Plan Gentle LE TE  and funcitonal mobility training, L knee ROM within painfree ranges, flexibility. WBAT LLE. Incorporate balance training, standing tolerance as tolerated.    Consulted and Agree with Plan of Care Patient           Patient will benefit from skilled therapeutic intervention in order to improve the following deficits and impairments:  Abnormal gait,Decreased range of motion,Obesity,Decreased endurance,Difficulty walking,Decreased activity tolerance,Pain,Improper body mechanics,Impaired flexibility,Decreased balance,Decreased mobility,Decreased strength,Increased edema  Visit Diagnosis: Pain  Stiffness of left knee, not elsewhere classified  Muscle weakness (generalized)  Left knee pain, unspecified chronicity  Pain in left hip  Unsteadiness on feet  Other abnormalities of gait and mobility  Abnormal posture  Closed fracture of left tibial plateau, sequela  Fracture  Closed fracture of right tibial plateau, initial encounter     Problem List Patient Active Problem List   Diagnosis Date Noted  . Debility 05/02/2020  . Urinary incontinence 05/02/2020  . Pain in left shin   . Urinary frequency   . Sleep disturbance   . Pain   . Anxiety state   . Urinary urgency   . Drug induced constipation   . Post-operative pain   . Benign essential HTN   . Pruritus   . Left medial tibial plateau fracture 03/01/2020  . Morbid obesity ([place])   . Acute blood loss anemia   . Postoperative pain   . Tibial plateau fracture 02/25/2020  . Tibial plateau fracture, left 02/25/2020  . Hyponatremia 02/25/2020  . Alcohol use disorder, moderate, dependence ([place]) 02/25/2020  . Essential hypertension 02/25/2020    Hall Busing, PT, DPT 07/25/2020, 11:49 AM  [place]. [place], [place], [address] Phone: [phone]   Fax:  [phone]  Name: [name] MRN: [id number] Date of Birth: [birth year]

## 2020-07-30 ENCOUNTER — Other Ambulatory Visit: Payer: Self-pay

## 2020-07-30 ENCOUNTER — Ambulatory Visit: Payer: BC Managed Care – PPO | Admitting: Physical Therapy

## 2020-07-30 DIAGNOSIS — M25662 Stiffness of left knee, not elsewhere classified: Secondary | ICD-10-CM

## 2020-07-30 DIAGNOSIS — M6281 Muscle weakness (generalized): Secondary | ICD-10-CM

## 2020-07-30 DIAGNOSIS — R52 Pain, unspecified: Secondary | ICD-10-CM

## 2020-07-30 DIAGNOSIS — M25552 Pain in left hip: Secondary | ICD-10-CM

## 2020-07-30 DIAGNOSIS — R2681 Unsteadiness on feet: Secondary | ICD-10-CM

## 2020-07-30 NOTE — Therapy (Signed)
[place]. [place], [place], [address] Phone: [phone]   Fax:  [phone]  Physical Therapy Treatment 10th visit Progress Note Reporting Period 06/18/2020 to 07/30/2020  See note below for Objective Data and Assessment of Progress/Goals.       Patient Details  Name: [name] MRN: [id number] Date of Birth: Aug 21, 1955 Referring Provider (PT): Ankit Lorie Phenix, MD - PM&R   Encounter Date: 07/30/2020   PT End of Session - 07/30/20 0844    Visit Number 10    Number of Visits 17    Date for PT Re-Evaluation 08/13/20    PT Start Time 0803    PT Stop Time 6962    PT Time Calculation (min) 44 min           No past medical history on file.  Past Surgical History:  Procedure Laterality Date  . ORIF TIBIA PLATEAU Left 02/26/2020   Procedure: OPEN REDUCTION INTERNAL FIXATION (ORIF) TIBIAL PLATEAU;  Surgeon: Shona Needles, MD;  Location: [facility];  Service: Orthopedics;  Laterality: Left;    There were no vitals filed for this visit.   Subjective Assessment - 07/30/20 0806    Subjective "bummed I feel like I took 2 steps back after last session and on Sat I was up alot cooking" 2 at rest 8/10 weight bearing    Currently in Pain? Yes    Pain Location Leg    Pain Orientation Left                             [facility] Adult PT Treatment/Exercise - 07/30/20 0001      Ambulation/Gait   Gait Comments Gait training with pt SPC with large base. 40 feet step thru gait pattern with CGA and good arm swing. some trendelumburg gait d/t weakness. Wide BOS 2 x      Lumbar Exercises: Aerobic   Nustep L 4 6 min      Knee/Hip Exercises: Standing   Hip Flexion Stengthening;Both;2 sets;20 reps;Knee bent;Knee straight   2# alt LE with UE support of walker on airex SBA   Hip Abduction Stengthening;Both;20 reps;Knee straight   2# alt LE SBA   Hip Extension Stengthening;Both;20 reps;Knee straight   2# alt LE  with PTA supporting walker SBA   Lateral Step Up Limitations 6 inch fwd step tap 2 sets 5 with SPC and HHA with cuing d/t fear      Knee/Hip Exercises: Seated   Long Arc Quad AROM;2 sets;Both;15 reps    Long Arc Quad Weight 2 lbs.    Marching Limitations 2# 6 inch box step up working hip flex and abd 2 sets 10 BIL    Hamstring Curl 2 sets;Strengthening;Both;15 reps    Hamstring Limitations green tb    Sit to Sand 2 sets;5 reps;without UE support   on airex, cuing needed for confidence                   PT Short Term Goals - 07/09/20 9528      PT SHORT TERM GOAL #1   Title Independent with initial HEP    Time 2    Period Weeks    Status Achieved    Target Date 07/02/20      PT SHORT TERM GOAL #2   Title Pt will complete at least 25 seconds with minimal sway for MCTSIB conditions 1 and 2 to facilitate  progress with safe static standing balance    Time 4    Period Weeks    Status On-going    Target Date 07/16/20             PT Long Term Goals - 07/30/20 0845      PT LONG TERM GOAL #1   Title Independent with advanced HEP    Status On-going      PT LONG TERM GOAL #2   Title Pt will achieve L Knee ROM 0-110 with </= 2/10 pain to facilitate safe functional mobility.    Baseline 2-100 AROM 3/10 pain    Status On-going      PT LONG TERM GOAL #3   Title Pt will be able to ambulate with minimal asymmetries and no LOB with least restrictive assistive device (LRAD) with </= 1/10 pain .    Status On-going      PT LONG TERM GOAL #4   Title Pt will report improved standing tolerance with pain </= 1/10 and no LOB for cooking and hygeine activities in the home    Baseline cooking more and up more but pain    Status Partially Met      PT LONG TERM GOAL #5   Title BLE strength at least 4+/5    Status On-going                 Plan - 07/30/20 0847    Clinical Impression Statement pt arrived down because of increased pain but explained as she gets more active  this is normal, at end of sesion pt felt better. increased functional standing ex to incorporate in balance with strength- pt fearful and needed cuing and assistance at times.   Pt is making gradual progress towards goals and will benefit from continued skilled PT to work on improving functional strength, ROM, balance and overall endurance.    PT Treatment/Interventions ADLs/Self Care Home Management;Cryotherapy;Electrical Stimulation;Iontophoresis 41m/ml Dexamethasone;Moist Heat;Gait training;DME Instruction;Neuromuscular re-education;Balance training;Therapeutic exercise;Therapeutic activities;Functional mobility training;Stair training;Patient/family education;Manual techniques;Energy conservation;Scar mobilization;Taping;Vasopneumatic Device    PT Next Visit Plan Gentle LE TE and funcitonal mobility training, L knee ROM within painfree ranges, flexibility. WBAT LLE. Incorporate balance training, standing tolerance as tolerated.           Patient will benefit from skilled therapeutic intervention in order to improve the following deficits and impairments:  Abnormal gait,Decreased range of motion,Obesity,Decreased endurance,Difficulty walking,Decreased activity tolerance,Pain,Improper body mechanics,Impaired flexibility,Decreased balance,Decreased mobility,Decreased strength,Increased edema  Visit Diagnosis: Pain  Stiffness of left knee, not elsewhere classified  Muscle weakness (generalized)  Pain in left hip  Unsteadiness on feet     Problem List Patient Active Problem List   Diagnosis Date Noted  . Debility 05/02/2020  . Urinary incontinence 05/02/2020  . Pain in left shin   . Urinary frequency   . Sleep disturbance   . Pain   . Anxiety state   . Urinary urgency   . Drug induced constipation   . Post-operative pain   . Benign essential HTN   . Pruritus   . Left medial tibial plateau fracture 03/01/2020  . Morbid obesity ([place]   . Acute blood loss anemia   .  Postoperative pain   . Tibial plateau fracture 02/25/2020  . Tibial plateau fracture, left 02/25/2020  . Hyponatremia 02/25/2020  . Alcohol use disorder, moderate, dependence ([place] 02/25/2020  . Essential hypertension 02/25/2020    Cardale Dorer,ANGIE PTA 07/30/2020, 8:49 AM   MHall Busing PT, DPT    [facility]  [address] [place] [place], [place], [address] Phone: [phone]   Fax:  [phone]  Name: Lashayla Armes MRN: [id number] Date of Birth: [birth year]

## 2020-08-01 ENCOUNTER — Ambulatory Visit: Payer: BC Managed Care – PPO

## 2020-08-01 ENCOUNTER — Other Ambulatory Visit: Payer: Self-pay

## 2020-08-01 DIAGNOSIS — M6281 Muscle weakness (generalized): Secondary | ICD-10-CM

## 2020-08-01 DIAGNOSIS — M25552 Pain in left hip: Secondary | ICD-10-CM

## 2020-08-01 DIAGNOSIS — R52 Pain, unspecified: Secondary | ICD-10-CM

## 2020-08-01 DIAGNOSIS — R2689 Other abnormalities of gait and mobility: Secondary | ICD-10-CM

## 2020-08-01 DIAGNOSIS — R293 Abnormal posture: Secondary | ICD-10-CM

## 2020-08-01 DIAGNOSIS — M25562 Pain in left knee: Secondary | ICD-10-CM

## 2020-08-01 DIAGNOSIS — R2681 Unsteadiness on feet: Secondary | ICD-10-CM

## 2020-08-01 DIAGNOSIS — T148XXA Other injury of unspecified body region, initial encounter: Secondary | ICD-10-CM

## 2020-08-01 DIAGNOSIS — M25662 Stiffness of left knee, not elsewhere classified: Secondary | ICD-10-CM

## 2020-08-01 NOTE — Therapy (Signed)
[place]. [place], [place], [address] Phone: [phone]   Fax:  [phone]  Physical Therapy Treatment  Patient Details  Name: [name] MRN: [id number] Date of Birth: 1956/02/14 Referring Provider (PT): Ankit Lorie Phenix, MD - PM&R   Encounter Date: 08/01/2020   PT End of Session - 08/01/20 0802    Visit Number 11    Number of Visits 17    Date for PT Re-Evaluation 08/13/20    PT Start Time 0757    PT Stop Time 0843    PT Time Calculation (min) 46 min    Activity Tolerance Patient tolerated treatment well;Patient limited by pain    Behavior During Therapy [facility] for tasks assessed/performed           History reviewed. No pertinent past medical history.  Past Surgical History:  Procedure Laterality Date  . ORIF TIBIA PLATEAU Left 02/26/2020   Procedure: OPEN REDUCTION INTERNAL FIXATION (ORIF) TIBIAL PLATEAU;  Surgeon: Shona Needles, MD;  Location: [facility];  Service: Orthopedics;  Laterality: Left;    There were no vitals filed for this visit.   Subjective Assessment - 08/01/20 0801    Subjective Doing okay, just muscles sore    Pertinent History L fx femur 2019. History of HTN. Obesity. Had COVID Feb 1st.    Patient Stated Goals To be abe to get out and about on her own, to try and walk without walker, stand and cook    Currently in Pain? Yes    Pain Score 3     Pain Location Knee    Pain Orientation Left    Pain Descriptors / Indicators Sore                [facility] Adult PT Treatment/Exercise - 08/01/20 0001      Ambulation/Gait   Gait Comments Gait training with pt SPC with large base. 70 feet step thru gait pattern with CGA and good arm swing. some trendelumburg gait d/t weakness. Wide BOS 2 x      Lumbar Exercises: Aerobic   Nustep L 4 x 6 min      Knee/Hip Exercises: Standing   Hip Flexion Stengthening;Both;2 sets;20 reps;Knee bent   2# alt LE with UE support of walker    Hip Abduction  Stengthening;Both;20 reps;Knee straight   2# alt LE    Hip Extension Stengthening;Both;20 reps;Knee straight   2# alt LE with PT supporting walker   Lateral Step Up Limitations 6 inch fwd step overs 2 sets 10 in  bars       Knee/Hip Exercises: Seated   Long Arc Quad AROM;2 sets;Both;15 reps    Long Arc Quad Weight 2 lbs.    Marching Limitations 2# 6 inch box step up working hip flex and abd 2 sets 10 BIL    Hamstring Curl 2 sets;Strengthening;Both;10 reps    Hamstring Limitations green tb    Sit to Sand without UE support;10 reps;2 sets   on airex, cuing needed for confidence                   PT Short Term Goals - 08/01/20 1143      PT SHORT TERM GOAL #1   Title Independent with initial HEP    Time 2    Period Weeks    Status Achieved    Target Date 07/02/20      PT SHORT TERM GOAL #2   Title Pt will complete  at least 25 seconds with minimal sway for MCTSIB conditions 1 and 2 to facilitate progress with safe static standing balance    Time 4    Period Weeks    Status On-going             PT Long Term Goals - 07/30/20 0845      PT LONG TERM GOAL #1   Title Independent with advanced HEP    Status On-going      PT LONG TERM GOAL #2   Title Pt will achieve L Knee ROM 0-110 with </= 2/10 pain to facilitate safe functional mobility.    Baseline 2-100 AROM 3/10 pain    Status On-going      PT LONG TERM GOAL #3   Title Pt will be able to ambulate with minimal asymmetries and no LOB with least restrictive assistive device (LRAD) with </= 1/10 pain .    Status On-going      PT LONG TERM GOAL #4   Title Pt will report improved standing tolerance with pain </= 1/10 and no LOB for cooking and hygeine activities in the home    Baseline cooking more and up more but pain    Status Partially Met      PT LONG TERM GOAL #5   Title BLE strength at least 4+/5    Status On-going                 Plan - 08/01/20 0803    Clinical Impression Statement Able to  progress sit to stands with incr reps today but incr knee pain at end of last set. Continues to be very motivated to push herself but requires some cuing to take therapeutic rest breaks. Discussed importance/ benefis of strength/resistance training for bone health. She will benefit from continued strength, balance and gait training to work on improving balance confidence with all upright mobility.    Personal Factors and Comorbidities Comorbidity 3+    Comorbidities Obesity, HTN, L femur fx history, Debility, chronic LBP    Examination-Activity Limitations Bathing;Bend;Caring for Others;Carry;Dressing;Hygiene/Grooming;Stand;Stairs;Squat;Locomotion Level;Transfers    Rehab Potential Fair    PT Frequency 2x / week    PT Duration 8 weeks    PT Treatment/Interventions ADLs/Self Care Home Management;Cryotherapy;Electrical Stimulation;Iontophoresis 29m/ml Dexamethasone;Moist Heat;Gait training;DME Instruction;Neuromuscular re-education;Balance training;Therapeutic exercise;Therapeutic activities;Functional mobility training;Stair training;Patient/family education;Manual techniques;Energy conservation;Scar mobilization;Taping;Vasopneumatic Device    PT Next Visit Plan Continue to progress LE TE and funcitonal mobility training, L knee ROM within painfree ranges, flexibility. WBAT LLE. Incorporate balance training, standing tolerance as tolerated. Seeing varkey/PA next week - complete PN before visit.    Consulted and Agree with Plan of Care Patient           Patient will benefit from skilled therapeutic intervention in order to improve the following deficits and impairments:  Abnormal gait,Decreased range of motion,Obesity,Decreased endurance,Difficulty walking,Decreased activity tolerance,Pain,Improper body mechanics,Impaired flexibility,Decreased balance,Decreased mobility,Decreased strength,Increased edema  Visit Diagnosis: Pain  Stiffness of left knee, not elsewhere classified  Muscle weakness  (generalized)  Pain in left hip  Unsteadiness on feet  Left knee pain, unspecified chronicity  Other abnormalities of gait and mobility  Abnormal posture  Fracture     Problem List Patient Active Problem List   Diagnosis Date Noted  . Debility 05/02/2020  . Urinary incontinence 05/02/2020  . Pain in left shin   . Urinary frequency   . Sleep disturbance   . Pain   . Anxiety state   . Urinary urgency   .  Drug induced constipation   . Post-operative pain   . Benign essential HTN   . Pruritus   . Left medial tibial plateau fracture 03/01/2020  . Morbid obesity ([place])   . Acute blood loss anemia   . Postoperative pain   . Tibial plateau fracture 02/25/2020  . Tibial plateau fracture, left 02/25/2020  . Hyponatremia 02/25/2020  . Alcohol use disorder, moderate, dependence ([place]) 02/25/2020  . Essential hypertension 02/25/2020    Hall Busing, PT, DPT 08/01/2020, 11:45 AM  [place]. [place], [place], [address] Phone: [phone]   Fax:  [phone]  Name: Glenola Wheat MRN: [id number] Date of Birth: 02/03/1956

## 2020-08-07 ENCOUNTER — Ambulatory Visit: Payer: BC Managed Care – PPO

## 2020-08-07 ENCOUNTER — Other Ambulatory Visit: Payer: Self-pay

## 2020-08-07 DIAGNOSIS — R2689 Other abnormalities of gait and mobility: Secondary | ICD-10-CM

## 2020-08-07 DIAGNOSIS — R293 Abnormal posture: Secondary | ICD-10-CM

## 2020-08-07 DIAGNOSIS — R52 Pain, unspecified: Secondary | ICD-10-CM

## 2020-08-07 DIAGNOSIS — M25662 Stiffness of left knee, not elsewhere classified: Secondary | ICD-10-CM

## 2020-08-07 DIAGNOSIS — M25552 Pain in left hip: Secondary | ICD-10-CM

## 2020-08-07 DIAGNOSIS — R2681 Unsteadiness on feet: Secondary | ICD-10-CM

## 2020-08-07 DIAGNOSIS — M25562 Pain in left knee: Secondary | ICD-10-CM

## 2020-08-07 DIAGNOSIS — M6281 Muscle weakness (generalized): Secondary | ICD-10-CM

## 2020-08-07 NOTE — Therapy (Signed)
[facility]. [place], [place], [address] Phone: [phone]   Fax:  [phone]  Physical Therapy Treatment/ Progress Update  Patient Details  Name: [name] MRN: [id number] Date of Birth: [birth year] Referring Provider (PT): Ankit Lorie Phenix, MD - PM&R   Encounter Date: 08/07/2020   PT End of Session - 08/07/20 0804    Visit Number 12    Date for PT Re-Evaluation 08/13/20    Authorization Type BCBS    PT Start Time 0800    PT Stop Time 0845    PT Time Calculation (min) 45 min    Activity Tolerance Patient tolerated treatment well;Patient limited by pain    Behavior During Therapy [facility] for tasks assessed/performed           History reviewed. No pertinent past medical history.  Past Surgical History:  Procedure Laterality Date  . ORIF TIBIA PLATEAU Left 02/26/2020   Procedure: OPEN REDUCTION INTERNAL FIXATION (ORIF) TIBIAL PLATEAU;  Surgeon: Shona Needles, MD;  Location: [facility];  Service: Orthopedics;  Laterality: Left;    There were no vitals filed for this visit.   Subjective Assessment - 08/07/20 0807    Subjective Doing okay, just concerned about visit limit with PT. Going to Dr Rich Fuchs tomorrow    Pertinent History L fx femur 2019. History of HTN. Obesity. Had COVID Feb 1st.    Patient Stated Goals To be abe to get out and about on her own, to try and walk without walker, stand and cook    Currently in Pain? Yes    Pain Score 2     Pain Location Knee    Pain Orientation Left    Pain Descriptors / Indicators Sore                [facility] Adult PT Treatment/Exercise - 08/07/20 0001      Ambulation/Gait   Gait Comments Gait training with pt SPC with large base. 80 ft x 2.  feet step thru gait pattern with CGA and good arm swing. some trendelumburg gait d/t weakness. Wide BOS      Lumbar Exercises: Aerobic   Nustep L 5 x 6 min      Knee/Hip Exercises: Standing   Hip Flexion Stengthening;Both;2  sets;20 reps;Knee bent   2# alt LE with UE support of walker on airex SBA   Hip Abduction Stengthening;Both;20 reps;Knee straight   2# alt LE - on airex   Hip Extension Stengthening;Both;20 reps;Knee straight   2# alt LE PT supporting walker - on airex   Lateral Step Up Limitations --      Knee/Hip Exercises: Seated   Long Arc Quad AROM;2 sets;Both;15 reps    Long Arc Quad Weight 3 lbs.    Other Seated Knee/Hip Exercises Seated march 3# 15 x 2    Marching Limitations 2# 6 inch box step up working hip flex and abd 2 sets 10 BIL    Hamstring Curl 2 sets;Strengthening;Both;15 reps    Hamstring Limitations green tb    Sit to Sand without UE support;10 reps;2 sets   on airex, cuing needed for confidence                   PT Short Term Goals - 08/01/20 1143      PT SHORT TERM GOAL #1   Title Independent with initial HEP    Time 2    Period Weeks    Status Achieved  Target Date 07/02/20      PT SHORT TERM GOAL #2   Title Pt will complete at least 25 seconds with minimal sway for MCTSIB conditions 1 and 2 to facilitate progress with safe static standing balance    Time 4    Period Weeks    Status On-going             PT Long Term Goals - 08/07/20 0840      PT LONG TERM GOAL #1   Title Independent with advanced HEP    Status On-going      PT LONG TERM GOAL #2   Title Pt will achieve L Knee ROM 0-110 with </= 2/10 pain to facilitate safe functional mobility.    Baseline 2-100 AROM 3/10 pain    Status On-going      PT LONG TERM GOAL #3   Title Pt will be able to ambulate with minimal asymmetries and no LOB with least restrictive assistive device (LRAD) with </= 1/10 pain .    Status On-going      PT LONG TERM GOAL #4   Title Pt will report improved standing tolerance with pain </= 1/10 and no LOB for cooking and hygeine activities in the home    Baseline cooking more and up more but pain    Status Partially Met      PT LONG TERM GOAL #5   Title BLE strength  at least 4+/5    Status On-going                 Plan - 08/07/20 0820    Clinical Impression Statement [name] is making good progress towards her goals. She has progressed with ambulation with SPC with large base for shorter distances and reports she has started to do some furniture surfing in the house occasionally without walker.  LE eccentric control gradually improving. Does get limited moreso by fatigue and intermittent left knee pain.  Plan to decrease to 1x/wk with increasing home exercises. She continues to demonstrate some endurance, balance and strength deficits and will benefit from continued skilled PT.    Personal Factors and Comorbidities Comorbidity 3+    Comorbidities Obesity, HTN, L femur fx history, Debility, chronic LBP    Examination-Activity Limitations Bathing;Bend;Caring for Others;Carry;Dressing;Hygiene/Grooming;Stand;Stairs;Squat;Locomotion Level;Transfers    Rehab Potential Fair    PT Frequency 1x / week   Plan to possibly decr to 1x/wk   PT Duration --    PT Treatment/Interventions ADLs/Self Care Home Management;Cryotherapy;Electrical Stimulation;Iontophoresis 55m/ml Dexamethasone;Moist Heat;Gait training;DME Instruction;Neuromuscular re-education;Balance training;Therapeutic exercise;Therapeutic activities;Functional mobility training;Stair training;Patient/family education;Manual techniques;Energy conservation;Scar mobilization;Taping;Vasopneumatic Device    PT Next Visit Plan Continue to progress LE TE and funcitonal mobility training, L knee ROM within painfree ranges, flexibility. WBAT LLE. Incorporate balance training, standing tolerance as tolerated. RECERT NEXT WEEK.    Consulted and Agree with Plan of Care Patient           Patient will benefit from skilled therapeutic intervention in order to improve the following deficits and impairments:  Abnormal gait,Decreased range of motion,Obesity,Decreased endurance,Difficulty walking,Decreased activity  tolerance,Pain,Improper body mechanics,Impaired flexibility,Decreased balance,Decreased mobility,Decreased strength,Increased edema  Visit Diagnosis: Pain  Stiffness of left knee, not elsewhere classified  Muscle weakness (generalized)  Pain in left hip  Unsteadiness on feet  Left knee pain, unspecified chronicity  Other abnormalities of gait and mobility  Abnormal posture     Problem List Patient Active Problem List   Diagnosis Date Noted  . Debility 05/02/2020  . Urinary incontinence  05/02/2020  . Pain in left shin   . Urinary frequency   . Sleep disturbance   . Pain   . Anxiety state   . Urinary urgency   . Drug induced constipation   . Post-operative pain   . Benign essential HTN   . Pruritus   . Left medial tibial plateau fracture 03/01/2020  . Morbid obesity ([place])   . Acute blood loss anemia   . Postoperative pain   . Tibial plateau fracture 02/25/2020  . Tibial plateau fracture, left 02/25/2020  . Hyponatremia 02/25/2020  . Alcohol use disorder, moderate, dependence ([place]) 02/25/2020  . Essential hypertension 02/25/2020    Hall Busing , PT, DPT 08/07/2020, 11:54 AM  [facility]. [place], [place], [address] Phone: [phone]   Fax:  [phone]  Name: [name] MRN: [id number] Date of Birth: Jun 04, 1955

## 2020-08-13 ENCOUNTER — Other Ambulatory Visit: Payer: Self-pay

## 2020-08-13 ENCOUNTER — Encounter: Payer: Self-pay | Admitting: Physical Therapy

## 2020-08-13 ENCOUNTER — Ambulatory Visit: Payer: BC Managed Care – PPO | Admitting: Physical Therapy

## 2020-08-13 DIAGNOSIS — M6281 Muscle weakness (generalized): Secondary | ICD-10-CM

## 2020-08-13 DIAGNOSIS — R52 Pain, unspecified: Secondary | ICD-10-CM

## 2020-08-13 DIAGNOSIS — M25552 Pain in left hip: Secondary | ICD-10-CM

## 2020-08-13 DIAGNOSIS — R2681 Unsteadiness on feet: Secondary | ICD-10-CM

## 2020-08-13 DIAGNOSIS — R2689 Other abnormalities of gait and mobility: Secondary | ICD-10-CM

## 2020-08-13 DIAGNOSIS — M25662 Stiffness of left knee, not elsewhere classified: Secondary | ICD-10-CM

## 2020-08-13 NOTE — Therapy (Signed)
[facility]. [place], [place], [address] Phone: [phone]   Fax:  [phone]  Physical Therapy Treatment  Patient Details  Name: [name] MRN: [id number] Date of Birth: 01/24/1956 Referring Provider (PT): Ankit Lorie Phenix, MD - PM&R   Encounter Date: 08/13/2020   PT End of Session - 08/13/20 0842    Visit Number 13    Date for PT Re-Evaluation 09/12/20    PT Start Time 0801    PT Stop Time 0842    PT Time Calculation (min) 41 min    Activity Tolerance Patient tolerated treatment well;Patient limited by pain    Behavior During Therapy [facility] for tasks assessed/performed           History reviewed. No pertinent past medical history.  Past Surgical History:  Procedure Laterality Date  . ORIF TIBIA PLATEAU Left 02/26/2020   Procedure: OPEN REDUCTION INTERNAL FIXATION (ORIF) TIBIAL PLATEAU;  Surgeon: Shona Needles, MD;  Location: [facility];  Service: Orthopedics;  Laterality: Left;    There were no vitals filed for this visit.   Subjective Assessment - 08/13/20 0804    Subjective Pt reports that she will be having a procedure within the next month to clean out the L knee; does not have it scheduled yet but thinks it will be within the next month.    Currently in Pain? Yes    Pain Score 2     Pain Location Knee    Pain Orientation Left                             [facility] Adult PT Treatment/Exercise - 08/13/20 0001      Lumbar Exercises: Aerobic   Nustep L 5 x 6 min      Knee/Hip Exercises: Standing   Hip Flexion Stengthening;Both;2 sets;20 reps;Knee bent   on airex   Hip Abduction Stengthening;Both;20 reps;Knee straight   on airex   Hip Extension Stengthening;Both;20 reps;Knee straight   on airex   Lateral Step Up Limitations 6 in fwd step up with B HR      Knee/Hip Exercises: Seated   Long Arc Quad AROM;2 sets;Both;15 reps    Long Arc Quad Weight 3 lbs.    Other Seated Knee/Hip  Exercises Seated march 3# 15 x 2    Hamstring Curl 2 sets;Strengthening;Both;15 reps    Hamstring Limitations green tb    Sit to Sand without UE support;10 reps;2 sets                    PT Short Term Goals - 08/01/20 1143      PT SHORT TERM GOAL #1   Title Independent with initial HEP    Time 2    Period Weeks    Status Achieved    Target Date 07/02/20      PT SHORT TERM GOAL #2   Title Pt will complete at least 25 seconds with minimal sway for MCTSIB conditions 1 and 2 to facilitate progress with safe static standing balance    Time 4    Period Weeks    Status On-going             PT Long Term Goals - 08/07/20 0840      PT LONG TERM GOAL #1   Title Independent with advanced HEP    Status On-going      PT LONG TERM GOAL #2  Title Pt will achieve L Knee ROM 0-110 with </= 2/10 pain to facilitate safe functional mobility.    Baseline 2-100 AROM 3/10 pain    Status On-going      PT LONG TERM GOAL #3   Title Pt will be able to ambulate with minimal asymmetries and no LOB with least restrictive assistive device (LRAD) with </= 1/10 pain .    Status On-going      PT LONG TERM GOAL #4   Title Pt will report improved standing tolerance with pain </= 1/10 and no LOB for cooking and hygeine activities in the home    Baseline cooking more and up more but pain    Status Partially Met      PT LONG TERM GOAL #5   Title BLE strength at least 4+/5    Status On-going                 Plan - 08/13/20 3557    Clinical Impression Statement Pt reports she has upcoming L knee surgery; will schedule soon and call back for more appts. On hold until surgery d/t visit limit. Ambulates into clinic with [facility] today. Demos good stability but trendlenbug and widened BOS. Walking short distances in clinic with no AD, no LOB. Would benefit from continued focus on endurance, balance, and strength deficits. Reassess baseline following surgery.    PT Treatment/Interventions  ADLs/Self Care Home Management;Cryotherapy;Electrical Stimulation;Iontophoresis 36m/ml Dexamethasone;Moist Heat;Gait training;DME Instruction;Neuromuscular re-education;Balance training;Therapeutic exercise;Therapeutic activities;Functional mobility training;Stair training;Patient/family education;Manual techniques;Energy conservation;Scar mobilization;Taping;Vasopneumatic Device    PT Next Visit Plan on hold until after surgery then resume L knee ROM, flexibility, balance, LE strength, and standing tolerance    Consulted and Agree with Plan of Care Patient           Patient will benefit from skilled therapeutic intervention in order to improve the following deficits and impairments:  Abnormal gait,Decreased range of motion,Obesity,Decreased endurance,Difficulty walking,Decreased activity tolerance,Pain,Improper body mechanics,Impaired flexibility,Decreased balance,Decreased mobility,Decreased strength,Increased edema  Visit Diagnosis: Pain  Stiffness of left knee, not elsewhere classified  Muscle weakness (generalized)  Pain in left hip  Unsteadiness on feet  Other abnormalities of gait and mobility     Problem List Patient Active Problem List   Diagnosis Date Noted  . Debility 05/02/2020  . Urinary incontinence 05/02/2020  . Pain in left shin   . Urinary frequency   . Sleep disturbance   . Pain   . Anxiety state   . Urinary urgency   . Drug induced constipation   . Post-operative pain   . Benign essential HTN   . Pruritus   . Left medial tibial plateau fracture 03/01/2020  . Morbid obesity ([place]   . Acute blood loss anemia   . Postoperative pain   . Tibial plateau fracture 02/25/2020  . Tibial plateau fracture, left 02/25/2020  . Hyponatremia 02/25/2020  . Alcohol use disorder, moderate, dependence ([place] 02/25/2020  . Essential hypertension 02/25/2020   AAmador Cunas PT, DPT ADonald ProseSugg 08/13/2020, 8:45 AM  [place] [address]Phone: [phone]  Fax:  [phone] Name: Rachel EugenioMRN: [id number] of Birth: 807/05/1955

## 2020-08-15 ENCOUNTER — Ambulatory Visit: Payer: BC Managed Care – PPO | Admitting: Physical Therapy

## 2020-08-20 ENCOUNTER — Ambulatory Visit: Payer: BC Managed Care – PPO | Admitting: Physical Therapy

## 2020-08-22 ENCOUNTER — Ambulatory Visit: Payer: BC Managed Care – PPO | Admitting: Physical Therapy

## 2020-08-26 ENCOUNTER — Other Ambulatory Visit (HOSPITAL_COMMUNITY)
Admission: RE | Admit: 2020-08-26 | Discharge: 2020-08-26 | Disposition: A | Discharge status: Home / Self Care | Payer: BC Managed Care – PPO | Source: Ambulatory Visit | Attending: Orthopaedic Surgery | Admitting: Orthopaedic Surgery

## 2020-08-26 DIAGNOSIS — Y792 Prosthetic and other implants, materials and accessory orthopedic devices associated with adverse incidents: Secondary | ICD-10-CM | POA: Diagnosis not present

## 2020-08-26 DIAGNOSIS — Z20822 Contact with and (suspected) exposure to covid-19: Secondary | ICD-10-CM | POA: Insufficient documentation

## 2020-08-26 DIAGNOSIS — Z01812 Encounter for preprocedural laboratory examination: Secondary | ICD-10-CM | POA: Insufficient documentation

## 2020-08-26 DIAGNOSIS — Z79899 Other long term (current) drug therapy: Secondary | ICD-10-CM | POA: Diagnosis not present

## 2020-08-26 DIAGNOSIS — M24662 Ankylosis, left knee: Secondary | ICD-10-CM | POA: Diagnosis not present

## 2020-08-26 DIAGNOSIS — M94262 Chondromalacia, left knee: Secondary | ICD-10-CM | POA: Diagnosis not present

## 2020-08-26 DIAGNOSIS — Z791 Long term (current) use of non-steroidal anti-inflammatories (NSAID): Secondary | ICD-10-CM | POA: Diagnosis not present

## 2020-08-26 DIAGNOSIS — I10 Essential (primary) hypertension: Secondary | ICD-10-CM | POA: Diagnosis not present

## 2020-08-26 DIAGNOSIS — Z87891 Personal history of nicotine dependence: Secondary | ICD-10-CM | POA: Diagnosis not present

## 2020-08-26 DIAGNOSIS — T8484XA Pain due to internal orthopedic prosthetic devices, implants and grafts, initial encounter: Secondary | ICD-10-CM | POA: Diagnosis not present

## 2020-08-26 LAB — SARS CORONAVIRUS 2 (TAT 6-24 HRS): SARS Coronavirus 2: NEGATIVE

## 2020-08-26 NOTE — Progress Notes (Signed)
DUE TO COVID-19 ONLY ONE VISITOR IS ALLOWED TO COME WITH YOU AND STAY IN THE WAITING ROOM ONLY DURING PRE OP AND PROCEDURE DAY OF SURGERY. THE 1 VISITOR  MAY VISIT WITH YOU AFTER SURGERY IN YOUR PRIVATE ROOM DURING VISITING HOURS ONLY!  YOU NEED TO HAVE A COVID 19 TEST ON__5/01/2021 _____ @_______ , THIS TEST MUST BE DONE BEFORE SURGERY,  COVID TESTING SITE [address] [place] [address], IT IS ON THE RIGHT GOING OUT [address] APPROXIMATELY  2 MINUTES PAST [employer] ON THE RIGHT. ONCE YOUR COVID TEST IS COMPLETED,  PLEASE BEGIN THE QUARANTINE INSTRUCTIONS AS OUTLINED IN YOUR HANDOUT.                [name]  08/26/2020   Your procedure is scheduled on:                     08/28/2020   Report to [facility] Main  Entrance   Report to admitting at     1045 AM     Call this number if you have problems the morning of surgery [phone]    REMEMBER: NO  SOLID FOOD CANDY OR GUM AFTER MIDNIGHT. CLEAR LIQUIDS UNTIL    0945am        . NOTHING BY MOUTH EXCEPT CLEAR LIQUIDS UNTIL      0945am   . PLEASE FINISH ENSURE DRINK PER SURGEON ORDER  WHICH NEEDS TO BE COMPLETED AT  0945am      .      CLEAR LIQUID DIET   Foods Allowed                                                                    Coffee and tea, regular and decaf                            Fruit ices (not with fruit pulp)                                      Iced Popsicles                                    Carbonated beverages, regular and diet                                    Cranberry, grape and apple juices Sports drinks like Gatorade Lightly seasoned clear broth or consume(fat free) Sugar, honey syrup ___________________________________________________________________      BRUSH YOUR TEETH MORNING OF SURGERY AND RINSE YOUR MOUTH OUT, NO CHEWING GUM CANDY OR MINTS.     Take these medicines the morning of surgery with A SIP OF WATER:      Gabapentin DO NOT TAKE ANY DIABETIC  MEDICATIONS DAY OF YOUR SURGERY  You may not have any metal on your body including hair pins and              piercings  Do not wear jewelry, make-up, lotions, powders or perfumes, deodorant             Do not wear nail polish on your fingernails.  Do not shave  48 hours prior to surgery.              Men may shave face and neck.   Do not bring valuables to the hospital. [place].  Contacts, dentures or bridgework may not be worn into surgery.  Leave suitcase in the car. After surgery it may be brought to your room.     Patients discharged the day of surgery will not be allowed to drive home. IF YOU ARE HAVING SURGERY AND GOING HOME THE SAME DAY, YOU MUST HAVE AN ADULT TO DRIVE YOU HOME AND BE WITH YOU FOR 24 HOURS. YOU MAY GO HOME BY TAXI OR UBER OR ORTHERWISE, BUT AN ADULT MUST ACCOMPANY YOU HOME AND STAY WITH YOU FOR 24 HOURS.  Name and phone number of your driver:  Special Instructions: N/A              Please read over the following fact sheets you were given: _____________________________________________________________________  [facility] - Preparing for Surgery Before surgery, you can play an important role.  Because skin is not sterile, your skin needs to be as free of germs as possible.  You can reduce the number of germs on your skin by washing with CHG (chlorahexidine gluconate) soap before surgery.  CHG is an antiseptic cleaner which kills germs and bonds with the skin to continue killing germs even after washing. Please DO NOT use if you have an allergy to CHG or antibacterial soaps.  If your skin becomes reddened/irritated stop using the CHG and inform your nurse when you arrive at Short Stay. Do not shave (including legs and underarms) for at least 48 hours prior to the first CHG shower.  You may shave your face/neck. Please follow these instructions carefully:  1.  Shower with CHG Soap the night  before surgery and the  morning of Surgery.  2.  If you choose to wash your hair, wash your hair first as usual with your  normal  shampoo.  3.  After you shampoo, rinse your hair and body thoroughly to remove the  shampoo.                           4.  Use CHG as you would any other liquid soap.  You can apply chg directly  to the skin and wash                       Gently with a scrungie or clean washcloth.  5.  Apply the CHG Soap to your body ONLY FROM THE NECK DOWN.   Do not use on face/ open                           Wound or open sores. Avoid contact with eyes, ears mouth and genitals (private parts).  Wash face,  Genitals (private parts) with your normal soap.             6.  Wash thoroughly, paying special attention to the area where your surgery  will be performed.  7.  Thoroughly rinse your body with warm water from the neck down.  8.  DO NOT shower/wash with your normal soap after using and rinsing off  the CHG Soap.                9.  Pat yourself dry with a clean towel.            10.  Wear clean pajamas.            11.  Place clean sheets on your bed the night of your first shower and do not  sleep with pets. Day of Surgery : Do not apply any lotions/deodorants the morning of surgery.  Please wear clean clothes to the hospital/surgery center.  FAILURE TO FOLLOW THESE INSTRUCTIONS MAY RESULT IN THE CANCELLATION OF YOUR SURGERY PATIENT SIGNATURE_________________________________  NURSE SIGNATURE__________________________________  ________________________________________________________________________

## 2020-08-27 ENCOUNTER — Other Ambulatory Visit: Payer: Self-pay

## 2020-08-27 ENCOUNTER — Encounter (HOSPITAL_COMMUNITY)
Admission: RE | Admit: 2020-08-27 | Discharge: 2020-08-27 | Disposition: A | Discharge status: Home / Self Care | Payer: BC Managed Care – PPO | Source: Ambulatory Visit | Attending: Orthopaedic Surgery | Admitting: Orthopaedic Surgery

## 2020-08-27 ENCOUNTER — Encounter (HOSPITAL_COMMUNITY): Payer: Self-pay

## 2020-08-27 ENCOUNTER — Ambulatory Visit: Payer: BC Managed Care – PPO | Admitting: Physical Therapy

## 2020-08-27 DIAGNOSIS — Z01812 Encounter for preprocedural laboratory examination: Secondary | ICD-10-CM | POA: Insufficient documentation

## 2020-08-27 DIAGNOSIS — T8484XA Pain due to internal orthopedic prosthetic devices, implants and grafts, initial encounter: Secondary | ICD-10-CM | POA: Diagnosis not present

## 2020-08-27 HISTORY — DX: Depression, unspecified: F32.A

## 2020-08-27 HISTORY — DX: Anxiety disorder, unspecified: F41.9

## 2020-08-27 HISTORY — DX: Unspecified osteoarthritis, unspecified site: M19.90

## 2020-08-27 LAB — BASIC METABOLIC PANEL
Anion gap: 5 (ref 5–15)
BUN: 10 mg/dL (ref 8–23)
CO2: 30 mmol/L (ref 22–32)
Calcium: 8.7 mg/dL — ABNORMAL LOW (ref 8.9–10.3)
Chloride: 102 mmol/L (ref 98–111)
Creatinine, Ser: 0.54 mg/dL (ref 0.44–1.00)
GFR, Estimated: >60 mL/min (ref >60)
Glucose, Bld: 94 mg/dL (ref 70–99)
Potassium: 4.9 mmol/L (ref 3.5–5.1)
Sodium: 137 mmol/L (ref 135–145)

## 2020-08-27 LAB — CBC
HCT: 40.6 % (ref 36.0–46.0)
Hemoglobin: 13.1 g/dL (ref 12.0–15.0)
MCH: 30.1 pg (ref 26.0–34.0)
MCHC: 32.3 g/dL (ref 30.0–36.0)
MCV: 93.3 fL (ref 80.0–100.0)
Platelets: 290 10*3/uL (ref 150–400)
RBC: 4.35 MIL/uL (ref 3.87–5.11)
RDW: 15.9 % — ABNORMAL HIGH (ref 11.5–15.5)
WBC: 9 10*3/uL (ref 4.0–10.5)
nRBC: 0 % (ref 0.0–0.2)

## 2020-08-27 NOTE — Progress Notes (Signed)
Anesthesia Review:  PCP: DR Suzanna Obey in [place], [place]  Cardiologist : Chest x-ray : EKG : Echo : Stress test: Cardiac Cath :  Activity level: cannot do a flgiht of stairs without difficulty due to knee Sleep Study/ CPAP : none  Fasting Blood Sugar :      / Checks Blood Sugar -- times a day:   Blood Thinner/ Instructions /Last Dose: ASA / Instructions/ Last Dose :  Blood pressure initially at preop was 182/105.  Pt denies any chest pain, dizziness, headache or shortness of breath.  PT states " IT is always elevated at preop appt.  Upon recheck blood pressure was 162/79.

## 2020-08-27 NOTE — H&P (Signed)
PREOPERATIVE H&P  Chief Complaint: LEFT KNEE CHONDROMALACIA PATELLA, ANKYLOSIS, MEDIAL MENISCUS TEAR, M22.42, M24.662, S83.249A  HPI: [name] is a 65 y.o. female who is scheduled for, Procedure(s): KNEE ARTHROSCOPY WITH CHONDROPLASTY, MANIPULATION WITH LYSIS OF ADHESIONS, POSSIBLE MEDIAL MENISECTOMY HARDWARE REMOVAL.   Patient has a past medical history significant for HTN.   She is a 65 year old who had a left medial tibial plateau fracture, open reduction and internal fixation 02-26-2020. She had a previous history of a remote femur fracture on that side. She has had continued pain in her knee. She does not think she can keep living with it. She had an injection which only lasted for a few weeks. She has gone to physical therapy.  She is frustrated by the function of her knee.   Her symptoms are rated as moderate to severe, and have been worsening.  This is significantly impairing activities of daily living.    Please see clinic note for further details on this patient's care.    She has elected for surgical management.   No past medical history on file. Past Surgical History:  Procedure Laterality Date  . ORIF TIBIA PLATEAU Left 02/26/2020   Procedure: OPEN REDUCTION INTERNAL FIXATION (ORIF) TIBIAL PLATEAU;  Surgeon: Shona Needles, MD;  Location: [facility];  Service: Orthopedics;  Laterality: Left;   Social History   Socioeconomic History  . Marital status: Married    Spouse name: Not on file  . Number of children: Not on file  . Years of education: Not on file  . Highest education level: Not on file  Occupational History  . Not on file  Tobacco Use  . Smoking status: Former Research scientist (life sciences)  . Smokeless tobacco: Never Used  Vaping Use  . Vaping Use: Every day  . Substances: Nicotine, Flavoring  Substance and Sexual Activity  . Alcohol use: Yes  . Drug use: Never  . Sexual activity: Not on file  Other Topics Concern  . Not on file  Social History Narrative  . Not on  file   Social Determinants of Health   Financial Resource Strain: Not on file  Food Insecurity: Not on file  Transportation Needs: Not on file  Physical Activity: Not on file  Stress: Not on file  Social Connections: Not on file   No family history on file. No Known Allergies Prior to Admission medications   Medication Sig Start Date End Date Taking? Authorizing Provider  acetaminophen (TYLENOL) 500 MG tablet Take 1,000 mg by mouth every 6 (six) hours as needed for mild pain.   Yes [provider]  CELEBREX 100 MG capsule Take 100 mg by mouth in the morning and at bedtime. 08/08/20  Yes [provider]  gabapentin (NEURONTIN) 100 MG capsule Take 100 mg by mouth in the morning, at noon, and at bedtime. 08/08/20  Yes [provider]  liver oil-zinc oxide (DESITIN) 40 % ointment Apply 1 application topically daily as needed for irritation.   Yes [provider]  VITAMIN D PO Take 1 tablet by mouth daily.   Yes [provider]  methocarbamol (ROBAXIN) 500 MG tablet Take 1 tablet (500 mg total) by mouth every 8 (eight) hours as needed for muscle spasms. Patient not taking: No sig reported 06/13/20   Jamse Arn, MD  pregabalin (LYRICA) 100 MG capsule TAKE 1 CAPSULE(100 MG) BY MOUTH THREE TIMES DAILY Patient not taking: No sig reported 07/05/20   Bayard Hugger, NP  ROS: All other systems have been reviewed and were otherwise negative with the exception of those mentioned in the HPI and as above.  Physical Exam: General: Alert, no acute distress Cardiovascular: No pedal edema Respiratory: No cyanosis, no use of accessory musculature GI: No organomegaly, abdomen is soft and non-tender Skin: No lesions in the area of chief complaint Neurologic: Sensation intact distally Psychiatric: Patient is competent for consent with normal mood and affect Lymphatic: No axillary or cervical lymphadenopathy  MUSCULOSKELETAL:  The range of motion of  her left knee is 0-about 90 degrees with significant pain with deeper flexion.  She has pain with light touch over the medial side of the knee. The incision is well healed. She had quad strength which seemed appropriate. Varus and valgus testing demonstrates a slight increase in varus and valgus instability as compared to the contralateral side. There is a good end point with a valgus stress across the  knee.   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 The plate appears to be in a reasonable position. No abnormality and no sign of nonunion.   Assessment: LEFT KNEE CHONDROMALACIA PATELLA, ANKYLOSIS, MEDIAL MENISCUS TEAR, M22.42, M24.662, S83.249A Medial plateau fracture with continued pain, mechanical symptoms and loss of range of motion.  Plan: Plan for Procedure(s): KNEE ARTHROSCOPY WITH CHONDROPLASTY, MANIPULATION WITH LYSIS OF ADHESIONS, POSSIBLE MEDIAL MENISECTOMY HARDWARE REMOVAL  The risks benefits and alternatives were discussed with the patient including but not limited to the risks of nonoperative treatment, versus surgical intervention including infection, bleeding, nerve injury,  blood clots, cardiopulmonary complications, morbidity, mortality, among others, and they were willing to proceed.   The patient acknowledged the explanation, agreed to proceed with the plan and consent was signed.   Operative Plan: Left knee arthroscopy, removal of hardware and lysis of adhesions with early therapy Discharge Medications: Tylenol, Celebrex, Oxycodone, Zofran, Gabapentin, Omeprazole DVT Prophylaxis: Aspirin Physical Therapy: Early PT Special Discharge needs: +/-   Ethelda Chick, PA-C  08/27/2020 6:55 AM

## 2020-08-27 NOTE — Progress Notes (Signed)
Initial blood pressure check in right arm at preop was 182/105.  Pt denies any chest pain, shortness of breath , lightheadedness or dizziness.  PT states blood pressure always elevated at preop appts.  Recheck in left arm blood pressure was 162/79.

## 2020-08-28 ENCOUNTER — Encounter (HOSPITAL_COMMUNITY)
Admission: RE | Disposition: A | Discharge status: Home / Self Care | Payer: Self-pay | Source: Home / Self Care | Attending: Orthopaedic Surgery

## 2020-08-28 ENCOUNTER — Ambulatory Visit (HOSPITAL_COMMUNITY)
Admission: RE | Admit: 2020-08-28 | Discharge: 2020-08-28 | Disposition: A | Discharge status: Home / Self Care | Payer: BC Managed Care – PPO | Attending: Orthopaedic Surgery | Admitting: Orthopaedic Surgery

## 2020-08-28 ENCOUNTER — Ambulatory Visit (HOSPITAL_COMMUNITY): Payer: BC Managed Care – PPO | Admitting: Anesthesiology

## 2020-08-28 ENCOUNTER — Encounter (HOSPITAL_COMMUNITY): Payer: Self-pay | Admitting: Orthopaedic Surgery

## 2020-08-28 DIAGNOSIS — T8484XA Pain due to internal orthopedic prosthetic devices, implants and grafts, initial encounter: Secondary | ICD-10-CM | POA: Insufficient documentation

## 2020-08-28 DIAGNOSIS — Z79899 Other long term (current) drug therapy: Secondary | ICD-10-CM | POA: Insufficient documentation

## 2020-08-28 DIAGNOSIS — M24662 Ankylosis, left knee: Secondary | ICD-10-CM | POA: Insufficient documentation

## 2020-08-28 DIAGNOSIS — I10 Essential (primary) hypertension: Secondary | ICD-10-CM | POA: Insufficient documentation

## 2020-08-28 DIAGNOSIS — Y792 Prosthetic and other implants, materials and accessory orthopedic devices associated with adverse incidents: Secondary | ICD-10-CM | POA: Insufficient documentation

## 2020-08-28 DIAGNOSIS — Z791 Long term (current) use of non-steroidal anti-inflammatories (NSAID): Secondary | ICD-10-CM | POA: Insufficient documentation

## 2020-08-28 DIAGNOSIS — Z20822 Contact with and (suspected) exposure to covid-19: Secondary | ICD-10-CM | POA: Insufficient documentation

## 2020-08-28 DIAGNOSIS — M94262 Chondromalacia, left knee: Secondary | ICD-10-CM | POA: Insufficient documentation

## 2020-08-28 DIAGNOSIS — Z87891 Personal history of nicotine dependence: Secondary | ICD-10-CM | POA: Insufficient documentation

## 2020-08-28 HISTORY — PX: HARDWARE REMOVAL: SHX979

## 2020-08-28 HISTORY — PX: KNEE ARTHROSCOPY WITH MEDIAL MENISECTOMY: SHX5651

## 2020-08-28 SURGERY — ARTHROSCOPY, KNEE, WITH MEDIAL MENISCECTOMY
Anesthesia: General | Site: Knee | Laterality: Left

## 2020-08-28 MED ORDER — VANCOMYCIN HCL 1000 MG IV SOLR
INTRAVENOUS | Status: AC
  Filled 2020-08-28: qty 1000

## 2020-08-28 MED ORDER — GABAPENTIN 100 MG PO CAPS: 100.0000 mg | ORAL_CAPSULE | Freq: Three times a day (TID) | ORAL | 0 refills | Status: DC

## 2020-08-28 MED ORDER — OMEPRAZOLE 20 MG PO CPDR: 20.0000 mg | DELAYED_RELEASE_CAPSULE | Freq: Every day | ORAL | 0 refills | Status: AC

## 2020-08-28 MED ORDER — PROPOFOL 10 MG/ML IV BOLUS
INTRAVENOUS | Status: AC
  Filled 2020-08-28: qty 20

## 2020-08-28 MED ORDER — LABETALOL HCL 5 MG/ML IV SOLN
INTRAVENOUS | Status: AC
  Filled 2020-08-28: qty 4

## 2020-08-28 MED ORDER — FENTANYL CITRATE (PF) 250 MCG/5ML IJ SOLN
INTRAMUSCULAR | Status: DC | PRN
  Administered 2020-08-28: 50 ug via INTRAVENOUS
  Administered 2020-08-28: 100 ug via INTRAVENOUS
  Administered 2020-08-28 (×2): 25 ug via INTRAVENOUS

## 2020-08-28 MED ORDER — KETOROLAC TROMETHAMINE 30 MG/ML IJ SOLN
INTRAMUSCULAR | Status: AC
  Administered 2020-08-28: 30 mg via INTRAVENOUS
  Filled 2020-08-28: qty 1

## 2020-08-28 MED ORDER — LIDOCAINE 2% (20 MG/ML) 5 ML SYRINGE
INTRAMUSCULAR | Status: AC
  Filled 2020-08-28: qty 5

## 2020-08-28 MED ORDER — METHOCARBAMOL 500 MG PO TABS: 500.0000 mg | ORAL_TABLET | Freq: Three times a day (TID) | ORAL | 0 refills | Status: DC | PRN

## 2020-08-28 MED ORDER — ONDANSETRON HCL 4 MG/2ML IJ SOLN
INTRAMUSCULAR | Status: DC | PRN
  Administered 2020-08-28: 4 mg via INTRAVENOUS

## 2020-08-28 MED ORDER — ONDANSETRON HCL 4 MG/2ML IJ SOLN
INTRAMUSCULAR | Status: AC
  Filled 2020-08-28: qty 2

## 2020-08-28 MED ORDER — FENTANYL CITRATE (PF) 100 MCG/2ML IJ SOLN
INTRAMUSCULAR | Status: AC
  Filled 2020-08-28: qty 2

## 2020-08-28 MED ORDER — TRIAMCINOLONE ACETONIDE 40 MG/ML IJ SUSP
INTRAMUSCULAR | Status: AC
  Filled 2020-08-28: qty 1

## 2020-08-28 MED ORDER — ORAL CARE MOUTH RINSE: 15.0000 mL | Freq: Once | OROMUCOSAL | Status: AC

## 2020-08-28 MED ORDER — OXYCODONE HCL 5 MG PO TABS: ORAL_TABLET | ORAL | 0 refills | Status: AC

## 2020-08-28 MED ORDER — CELECOXIB 200 MG PO CAPS: 200.0000 mg | ORAL_CAPSULE | Freq: Two times a day (BID) | ORAL | 0 refills | Status: AC

## 2020-08-28 MED ORDER — SODIUM CHLORIDE 0.9 % IR SOLN
Status: DC | PRN
  Administered 2020-08-28: 6000 mL

## 2020-08-28 MED ORDER — ACETAMINOPHEN 500 MG PO TABS
1000.0000 mg | ORAL_TABLET | Freq: Once | ORAL | Status: AC
  Administered 2020-08-28: 1000 mg via ORAL
  Filled 2020-08-28: qty 2

## 2020-08-28 MED ORDER — VANCOMYCIN HCL 1000 MG IV SOLR
INTRAVENOUS | Status: DC | PRN
  Administered 2020-08-28: 1000 mg via TOPICAL

## 2020-08-28 MED ORDER — ACETAMINOPHEN 500 MG PO TABS: 1000.0000 mg | ORAL_TABLET | Freq: Three times a day (TID) | ORAL | 0 refills | Status: DC

## 2020-08-28 MED ORDER — SULFAMETHOXAZOLE-TRIMETHOPRIM 800-160 MG PO TABS: 1.0000 | ORAL_TABLET | Freq: Two times a day (BID) | ORAL | 0 refills | Status: DC

## 2020-08-28 MED ORDER — LIDOCAINE 2% (20 MG/ML) 5 ML SYRINGE
INTRAMUSCULAR | Status: DC | PRN
  Administered 2020-08-28: 100 mg via INTRAVENOUS

## 2020-08-28 MED ORDER — MIDAZOLAM HCL 2 MG/2ML IJ SOLN
INTRAMUSCULAR | Status: AC
  Filled 2020-08-28: qty 2

## 2020-08-28 MED ORDER — TRIAMCINOLONE ACETONIDE 40 MG/ML IJ SUSP (RADIOLOGY)
INTRAMUSCULAR | Status: DC | PRN
  Administered 2020-08-28: 40 mg

## 2020-08-28 MED ORDER — DEXAMETHASONE SODIUM PHOSPHATE 10 MG/ML IJ SOLN
INTRAMUSCULAR | Status: DC | PRN
  Administered 2020-08-28: 10 mg via INTRAVENOUS

## 2020-08-28 MED ORDER — MIDAZOLAM HCL 5 MG/5ML IJ SOLN
INTRAMUSCULAR | Status: DC | PRN
  Administered 2020-08-28: 2 mg via INTRAVENOUS

## 2020-08-28 MED ORDER — SCOPOLAMINE 1 MG/3DAYS TD PT72
1.0000 | MEDICATED_PATCH | TRANSDERMAL | Status: DC
  Administered 2020-08-28: 1.5 mg via TRANSDERMAL
  Filled 2020-08-28: qty 1

## 2020-08-28 MED ORDER — DIPHENHYDRAMINE HCL 25 MG PO CAPS
ORAL_CAPSULE | ORAL | Status: AC
  Filled 2020-08-28: qty 1

## 2020-08-28 MED ORDER — FENTANYL CITRATE (PF) 100 MCG/2ML IJ SOLN: 25.0000 ug | INTRAMUSCULAR | Status: DC | PRN

## 2020-08-28 MED ORDER — ONDANSETRON HCL 4 MG PO TABS: 4.0000 mg | ORAL_TABLET | Freq: Three times a day (TID) | ORAL | 0 refills | Status: AC | PRN

## 2020-08-28 MED ORDER — CEFAZOLIN IN SODIUM CHLORIDE 3-0.9 GM/100ML-% IV SOLN
3.0000 g | INTRAVENOUS | Status: AC
  Administered 2020-08-28: 3 g via INTRAVENOUS
  Filled 2020-08-28: qty 100

## 2020-08-28 MED ORDER — DEXAMETHASONE SODIUM PHOSPHATE 10 MG/ML IJ SOLN
INTRAMUSCULAR | Status: AC
  Filled 2020-08-28: qty 1

## 2020-08-28 MED ORDER — CELECOXIB 200 MG PO CAPS
200.0000 mg | ORAL_CAPSULE | Freq: Once | ORAL | Status: AC
  Administered 2020-08-28: 200 mg via ORAL
  Filled 2020-08-28: qty 1

## 2020-08-28 MED ORDER — FENTANYL CITRATE (PF) 100 MCG/2ML IJ SOLN
25.0000 ug | INTRAMUSCULAR | Status: DC | PRN
  Administered 2020-08-28 (×2): 50 ug via INTRAVENOUS

## 2020-08-28 MED ORDER — OXYCODONE HCL 5 MG PO TABS
5.0000 mg | ORAL_TABLET | Freq: Once | ORAL | Status: AC
  Administered 2020-08-28: 5 mg via ORAL

## 2020-08-28 MED ORDER — CHLORHEXIDINE GLUCONATE 0.12 % MT SOLN
15.0000 mL | Freq: Once | OROMUCOSAL | Status: AC
  Administered 2020-08-28: 15 mL via OROMUCOSAL

## 2020-08-28 MED ORDER — BUPIVACAINE HCL (PF) 0.5 % IJ SOLN
INTRAMUSCULAR | Status: AC
  Filled 2020-08-28: qty 30

## 2020-08-28 MED ORDER — FENTANYL CITRATE (PF) 100 MCG/2ML IJ SOLN
INTRAMUSCULAR | Status: AC
  Administered 2020-08-28: 50 ug via INTRAVENOUS
  Filled 2020-08-28: qty 2

## 2020-08-28 MED ORDER — LACTATED RINGERS IV SOLN: INTRAVENOUS | Status: DC

## 2020-08-28 MED ORDER — KETOROLAC TROMETHAMINE 30 MG/ML IJ SOLN: 30.0000 mg | Freq: Once | INTRAMUSCULAR | Status: AC

## 2020-08-28 MED ORDER — CEFAZOLIN IN SODIUM CHLORIDE 3-0.9 GM/100ML-% IV SOLN
INTRAVENOUS | Status: AC
  Filled 2020-08-28: qty 100

## 2020-08-28 MED ORDER — DIPHENHYDRAMINE HCL 25 MG PO CAPS
25.0000 mg | ORAL_CAPSULE | Freq: Once | ORAL | Status: AC
  Administered 2020-08-28: 25 mg via ORAL

## 2020-08-28 MED ORDER — ASPIRIN 81 MG PO CHEW: 81.0000 mg | CHEWABLE_TABLET | Freq: Two times a day (BID) | ORAL | 0 refills | Status: DC

## 2020-08-28 MED ORDER — PROPOFOL 10 MG/ML IV BOLUS
INTRAVENOUS | Status: DC | PRN
  Administered 2020-08-28: 170 mg via INTRAVENOUS

## 2020-08-28 MED ORDER — LABETALOL HCL 5 MG/ML IV SOLN
INTRAVENOUS | Status: DC | PRN
  Administered 2020-08-28 (×2): 5 mg via INTRAVENOUS

## 2020-08-28 MED ORDER — BUPIVACAINE HCL (PF) 0.5 % IJ SOLN
INTRAMUSCULAR | Status: DC | PRN
  Administered 2020-08-28: 20 mL
  Administered 2020-08-28: 2 mL

## 2020-08-28 MED ORDER — OXYCODONE HCL 5 MG PO TABS
ORAL_TABLET | ORAL | Status: AC
  Filled 2020-08-28: qty 1

## 2020-08-28 SURGICAL SUPPLY — 77 items
BANDAGE ESMARK 6X9 LF (GAUZE/BANDAGES/DRESSINGS) IMPLANT
BLADE CLIPPER SURG (BLADE) IMPLANT
BLADE EXCALIBUR 4.0X13 (MISCELLANEOUS) IMPLANT
BLADE SURG SZ10 CARB STEEL (BLADE) ×3 IMPLANT
BNDG ELASTIC 4X5.8 VLCR STR LF (GAUZE/BANDAGES/DRESSINGS) ×3 IMPLANT
BNDG ELASTIC 6X5.8 VLCR STR LF (GAUZE/BANDAGES/DRESSINGS) ×6 IMPLANT
BNDG ESMARK 4X9 LF (GAUZE/BANDAGES/DRESSINGS) IMPLANT
BNDG ESMARK 6X9 LF (GAUZE/BANDAGES/DRESSINGS)
CHLORAPREP W/TINT 26 (MISCELLANEOUS) ×3 IMPLANT
CLSR STERI-STRIP ANTIMIC 1/2X4 (GAUZE/BANDAGES/DRESSINGS) ×6 IMPLANT
CNTNR URN SCR LID CUP LEK RST (MISCELLANEOUS) ×2 IMPLANT
CONT SPEC 4OZ STRL OR WHT (MISCELLANEOUS) ×3
COVER SURGICAL LIGHT HANDLE (MISCELLANEOUS) ×3 IMPLANT
COVER WAND RF STERILE (DRAPES) IMPLANT
CUFF TOURN SGL QUICK 24 (TOURNIQUET CUFF)
CUFF TOURN SGL QUICK 34 (TOURNIQUET CUFF)
CUFF TRNQT CYL 24X4X16.5-23 (TOURNIQUET CUFF) IMPLANT
CUFF TRNQT CYL 34X4.125X (TOURNIQUET CUFF) IMPLANT
DECANTER SPIKE VIAL GLASS SM (MISCELLANEOUS) ×3 IMPLANT
DISSECTOR 4.0MMX13CM CVD (MISCELLANEOUS) ×3 IMPLANT
DRAPE ARTHROSCOPY W/POUCH 114 (DRAPES) ×3 IMPLANT
DRAPE C-ARM 42X120 X-RAY (DRAPES) ×3 IMPLANT
DRAPE ORTHO SPLIT 77X108 STRL (DRAPES) ×6
DRAPE POUCH INSTRU U-SHP 10X18 (DRAPES) IMPLANT
DRAPE SURG ORHT 6 SPLT 77X108 (DRAPES) ×4 IMPLANT
DRAPE TOP 10253 STERILE (DRAPES) ×3 IMPLANT
DRAPE U-SHAPE 47X51 STRL (DRAPES) ×3 IMPLANT
DRSG AQUACEL AG ADV 3.5X 4 (GAUZE/BANDAGES/DRESSINGS) ×6 IMPLANT
DRSG AQUACEL AG ADV 3.5X 6 (GAUZE/BANDAGES/DRESSINGS) ×3 IMPLANT
DRSG PAD ABDOMINAL 8X10 ST (GAUZE/BANDAGES/DRESSINGS) ×9 IMPLANT
ELECT PENCIL ROCKER SW 15FT (MISCELLANEOUS) IMPLANT
ELECT REM PT RETURN 15FT ADLT (MISCELLANEOUS) ×3 IMPLANT
EVACUATOR 1/8 PVC DRAIN (DRAIN) IMPLANT
GAUZE SPONGE 4X4 12PLY STRL (GAUZE/BANDAGES/DRESSINGS) ×3 IMPLANT
GAUZE XEROFORM 1X8 LF (GAUZE/BANDAGES/DRESSINGS) ×3 IMPLANT
GLOVE SRG 8 PF TXTR STRL LF DI (GLOVE) ×4 IMPLANT
GLOVE SURG ENC MOIS LTX SZ6.5 (GLOVE) ×6 IMPLANT
GLOVE SURG LTX SZ8 (GLOVE) ×6 IMPLANT
GLOVE SURG ORTHO LTX SZ8 (GLOVE) ×3 IMPLANT
GLOVE SURG UNDER POLY LF SZ6.5 (GLOVE) ×3 IMPLANT
GLOVE SURG UNDER POLY LF SZ8 (GLOVE) ×6
GOWN STRL REUS W/ TWL LRG LVL3 (GOWN DISPOSABLE) ×2 IMPLANT
GOWN STRL REUS W/TWL 2XL LVL3 (GOWN DISPOSABLE) ×3 IMPLANT
GOWN STRL REUS W/TWL LRG LVL3 (GOWN DISPOSABLE) ×6 IMPLANT
GOWN STRL REUS W/TWL XL LVL3 (GOWN DISPOSABLE) ×3 IMPLANT
KIT BASIN OR (CUSTOM PROCEDURE TRAY) ×3 IMPLANT
KIT STABILIZATION SHOULDER (MISCELLANEOUS) ×3 IMPLANT
LOOP VESSEL MAXI BLUE (MISCELLANEOUS) IMPLANT
MANIFOLD NEPTUNE II (INSTRUMENTS) ×3 IMPLANT
NDL SAFETY ECLIPSE 18X1.5 (NEEDLE) ×2 IMPLANT
NEEDLE HYPO 18GX1.5 SHARP (NEEDLE) ×3
NS IRRIG 1000ML POUR BTL (IV SOLUTION) ×3 IMPLANT
PACK ARTHROSCOPY WL (CUSTOM PROCEDURE TRAY) ×3 IMPLANT
PACK SHOULDER (CUSTOM PROCEDURE TRAY) ×3 IMPLANT
PADDING CAST ABS 6INX4YD NS (CAST SUPPLIES) ×1
PADDING CAST ABS COTTON 6X4 NS (CAST SUPPLIES) ×2 IMPLANT
PADDING CAST COTTON 6X4 STRL (CAST SUPPLIES) ×3 IMPLANT
PORT APPOLLO RF 90DEGREE MULTI (SURGICAL WAND) IMPLANT
SLEEVE SCD COMPRESS KNEE MED (STOCKING) IMPLANT
SPLINT CAST 1 STEP 5X30 WHT (MISCELLANEOUS) ×60 IMPLANT
SPONGE LAP 4X18 RFD (DISPOSABLE) ×3 IMPLANT
STRIP CLOSURE SKIN 1/2X4 (GAUZE/BANDAGES/DRESSINGS) ×3 IMPLANT
SUCTION FRAZIER HANDLE 10FR (MISCELLANEOUS) ×3
SUCTION TUBE FRAZIER 10FR DISP (MISCELLANEOUS) ×2 IMPLANT
SUT ETHILON 2 0 PS N (SUTURE) ×6 IMPLANT
SUT ETHILON 3 0 PS 1 (SUTURE) ×6 IMPLANT
SUT MNCRL AB 4-0 PS2 18 (SUTURE) ×3 IMPLANT
SUT MON AB 3-0 SH 27 (SUTURE) ×6
SUT MON AB 3-0 SH27 (SUTURE) ×4 IMPLANT
SUT VIC AB 0 CT1 27 (SUTURE) ×3
SUT VIC AB 0 CT1 27XBRD ANBCTR (SUTURE) ×2 IMPLANT
SUT VIC AB 3-0 SH 27 (SUTURE) ×9
SUT VIC AB 3-0 SH 27X BRD (SUTURE) ×6 IMPLANT
SYR CONTROL 10ML LL (SYRINGE) ×3 IMPLANT
TOWEL OR 17X26 10 PK STRL BLUE (TOWEL DISPOSABLE) ×3 IMPLANT
TUBE SUCTION HIGH CAP CLEAR NV (SUCTIONS) ×3 IMPLANT
TUBING ARTHROSCOPY IRRIG 16FT (MISCELLANEOUS) ×3 IMPLANT

## 2020-08-28 NOTE — Op Note (Signed)
Orthopaedic Surgery Operative Note (CSN: [id number])  [name]  1956/03/10 Date of Surgery: 08/28/2020   Diagnoses:  Painful hardware left knee with stiffness and medial knee pain  Procedure: Left proximal tibial hardware removal Left proximal tibial incision debridement deep abscess Left knee arthroscopy with medial femoral chondroplasty Left knee lysis of adhesions and manipulation under anesthesia.  Operative Finding Exam under anesthesia: Patient had hyperextension about 5 degrees a mild opening to a valgus stress across the knee about 5 millimeters.  No anterior posterior instability.  Flexion to about 90 degrees with tension afterwards.  After manipulation able to flex 120 degrees limited by body habitus. Suprapatellar pouch: Mild scarring taken down with a shaver Patellofemoral Compartment: Grade 3 and 4 central trochlear and patellar changes consistent with osteoarthritis. Medial Compartment: Grade 3 and 4 changes diffusely throughout the weightbearing portion of the medial femoral condyle as well as the medial tibial plateau.  Meniscus was relatively preserved though there is some areas cartilage that may have caused mechanical symptoms.. Lateral Compartment: Small area of grade 3 changes at the base of the lateral tibial eminence.  Femur is normal. Intercondylar Notch: Normal with some notch overgrowth  Successful completion of the planned procedure.  Patient's knee scope was relatively routine and we close the portal that was made through her medial parapatellar incision.  At that point when we opened near the plate there was a small area of purulent type material.  We took specimens from this area and thankfully the knee was isolated throughout this.  We did not find that the fluid collection tracked past the plate.  We were able to remove all sick screws in the plate without issue.  We debrided this area excisionally and irrigated copiously.  There is no sign of infection within  the joint.  There is a worry that infection will go towards the joint even with a portal closure that was tight.  Post-operative plan: The patient will be weightbearing to tolerance.  The patient will be discharged home on oral antibiotics while cultures are pending.  DVT prophylaxis Aspirin 81 mg twice daily for 6 weeks.  Pain control with PRN pain medication preferring oral medicines.  Follow up plan will be scheduled in approximately 7 days for incision check and XR.  Post-Op Diagnosis: Same Surgeons:Primary: Rachel Gash, MD Assistants:Rachel McBane PA-C Location: [facility] Anesthesia: General with local anesthetic Antibiotics: Ancef 3 g, local 1 g of vancomycin powder Tourniquet time: None Estimated Blood Loss: 30 mL Complications: None Specimens: We had no suspicion of infection however the patient had concerns around her plate and we sent 1 specimen for culture, this was not in the joint. Implants: * No implants in log *  Indications for Surgery:   [name] is a 65 y.o. female with previous medial plateau fracture and instability status post or open reduction internal fixation.  Patient continued pain mechanical symptoms with intractable pain even to light touch over the medial aspect of her knee without redness or erythema.  Benefits and risks of operative and nonoperative management were discussed prior to surgery with patient/guardian(s) and informed consent form was completed.  Specific risks including infection, need for additional surgery, need for arthroplasty, continued pain or instability, stiffness amongst others   Procedure:   The patient was identified properly. Informed consent was obtained and the surgical site was marked. The patient was taken up to suite where general anesthesia was induced. The patient was placed in the supine position with a  post against the surgical leg and a nonsterile tourniquet applied. The surgical leg was then prepped and draped usual  sterile fashion.  A standard surgical timeout was performed.  2 standard anterior portals were made and diagnostic arthroscopy performed. Please note the findings as noted above.  We cleared the joint as above.  Medial femoral condyle chondroplasty performed with a shaver back to a stable base.  We did a lysis of adhesions using a shaver and the suprapatellar pouch and for medication anesthesia achieving improved motion as above.  Once we cleared the joint we turned attention open portion of the procedure.  The inferior half of the medial parapatellar incision was made and were able to dissect through skin sharply achieving hemostasis progressed.  We then identified the fascial planes where the plate was placed.  We opened this in line with the fibers of the MCL taking care to stay anterior to the MCL.  We then dissected with a Cobb superficial to the middle of the plate.  Immediately we encountered some purulent type material.  At that point we take specimens.  We used the [employer] instruments to remove all hardware.  Hardware is removed without issue.  We then performed excisional debridement of fascia, deep tissue and some bone.    Debridement type: Excisional Debridement  Side: left  Body Location: Proximal tibia  Tools used for debridement: scalpel, curette and rongeur  Pre-debridement Wound size (cm):   Length: 3        width: 5     depth: 1  Post-debridement Wound size (cm):   Length: 3        width: 5     depth: 1  Debridement depth beyond dead/damaged tissue down to healthy viable tissue: yes  Tissue layer involved: skin, subcutaneous tissue, muscle / fascia  Nature of tissue removed: Devitalized Tissue, Non-viable tissue and Purulence  Irrigation volume: 6 L     Irrigation fluid type: Normal Saline   Once we removed the substances we irrigated with 6 L of normal saline.  We had previously closed the deep layer of the portal with 0 Vicryl watertight to avoid tracking of  the infection into the joint.  We used deep stitches to close the fascial layers to close dead space.  We closed incision with nonabsorbable suture after placing local vancomycin and local anesthetic.  Patient awoken and taken to PACU in stable condition.  Noemi Chapel, PA-C, present and scrubbed throughout the case, critical for completion in a timely fashion, and for retraction, instrumentation, closure.

## 2020-08-28 NOTE — Interval H&P Note (Signed)
History and Physical Interval Note:  08/28/2020 11:29 AM  [name]  has presented today for surgery, with the diagnosis of LEFT KNEE CHONDROMALACIA PATELLA, ANKYLOSIS, MEDIAL MENISCUS TEAR, M22.42, M24.662, S83.249A.  The various methods of treatment have been discussed with the patient and family. After consideration of risks, benefits and other options for treatment, the patient has consented to  Procedure(s): KNEE ARTHROSCOPY WITH CHONDROPLASTY, MANIPULATION WITH LYSIS OF ADHESIONS, POSSIBLE MEDIAL MENISECTOMY (Left) HARDWARE REMOVAL (Left) as a surgical intervention.  The patient's history has been reviewed, patient examined, no change in status, stable for surgery.  I have reviewed the patient's chart and labs.  Questions were answered to the patient's satisfaction.     Hiram Gash

## 2020-08-28 NOTE — Discharge Instructions (Signed)
Ophelia Charter MD, MPH Noemi Chapel, PA-C [facility] [address] [phone] (tel)   [phone] (fax)   POST-OPERATIVE INSTRUCTIONS - Knee Arthroscopy  WOUND CARE - You may remove the Operative Dressing on Post-Op Day #3 (72hrs after surgery).   -  Alternatively if you would like you can leave dressing on until follow-up if within 7-8 days but keep it dry. - An ACE wrap may be used to control swelling, do not wrap this too tight.  If the initial ACE wrap feels too tight you may loosen it. - There may be a small amount of fluid/bleeding leaking at the surgical site.  - This is normal; the knee is filled with fluid during the procedure and can leak for 24-48hrs after surgery. You may change/reinforce the bandage as needed.  - Use the Cryocuff or Ice as often as possible for the first 7 days, then as needed for pain relief. Always keep a towel, ACE wrap or other barrier between the cooling unit and your skin.  - You may shower on Post-Op Day #3. Gently pat the area dry. Do not soak the knee in water or submerge it.  - Do not go swimming in the pool or ocean until 4 weeks after surgery or when otherwise instructed.  Keep incisions as dry as possible. -           Keep incisions covered with gauze or a large bandage   BRACE/AMBULATION -  You can use your brace until your nerve block wears off.  At that time you can remove it. -            Unless otherwise specified, you will not need a brace after this procedure.    REGIONAL ANESTHESIA (NERVE BLOCKS) - The anesthesia team may have performed a nerve block for you if safe in the setting of your care.  This is a great tool used to minimize pain.  Typically the block may start wearing off overnight.  This can be a challenging period but please utilize your as needed pain medications to try and manage this period and know it will be a brief transition as the nerve block wears completely   POST-OP MEDICATIONS -  Multimodal approach to pain control - In general your pain will be controlled with a combination of substances.  Prescriptions unless otherwise discussed are electronically sent to your pharmacy.  This is a carefully made plan we use to minimize narcotic use.     - Celebrex - Anti-inflammatory medication taken on a scheduled basis   - Take 1 tablet twice a day - Acetaminophen - Non-narcotic pain medicine taken on a scheduled basis    - Take two 500 mg tablets (1,000 mg total) every 8 hours - Gabapentin - this is a non-narcotic medication to help with pain, take on a scheduled basis   - Take 1 tablet three times a day - Robaxin - this is a muscle relaxer, take as needed for muscle spasms - Oxycodone - This is a strong narcotic, to be used only on an "as needed" basis for severe pain. - Aspirin 81mg  - This medicine is used to minimize the risk of blood clots after surgery.   - Take 1 tablet twice a day for 6 weeks - Omeprazole -daily medicine to protect your stomach while taking anti-inflammatories.  -  Zofran - take as needed for nausea -           Bactrim -  this is an antibiotic, take as instructed   FOLLOW-UP   Please call the office to schedule a follow-up appointment for your incision check, 7-10 days post-operatively.   IF YOU HAVE ANY QUESTIONS, PLEASE FEEL FREE TO CALL OUR OFFICE.   HELPFUL INFORMATION  - If you had a block, it will wear off between 8-24 hrs postop typically.  This is period when your pain may go from nearly zero to the pain you would have had post-op without the block.  This is an abrupt transition but nothing dangerous is happening.  You may take an extra dose of narcotic when this happens.   Keep your leg elevated to decrease swelling, which will then in turn decrease your pain. I would elevate the foot of your bed by putting a couple of couch pillows between your mattress and box spring. I would not keep pillow directly under your ankle.  - Do not sleep  with a pillow behind your knee even if it is more comfortable as this may make it harder to get your knee fully straight long term.   There will be MORE swelling on days 1-3 than there is on the day of surgery.  This also is normal. The swelling will decrease with the anti-inflammatory medication, ice and keeping it elevated. The swelling will make it more difficult to bend your knee. As the swelling goes down your motion will become easier   You may develop swelling and bruising that extends from your knee down to your calf and perhaps even to your foot over the next week. Do not be alarmed. This too is normal, and it is due to gravity   There may be some numbness adjacent to the incision site. This may last for 6-12 months or longer in some patients and is expected.   You may return to sedentary work/school in the next couple of days when you feel up to it. You will need to keep your leg elevated as much as possible    You should wean off your narcotic medicines as soon as you are able.  Most patients will be off or using minimal narcotics before their first postop appointment.    We suggest you use the pain medication the first night prior to going to bed, in order to ease any pain when the anesthesia wears off. You should avoid taking pain medications on an empty stomach as it will make you nauseous.   Do not drink alcoholic beverages or take illicit drugs when taking pain medications.   It is against the law to drive while taking narcotics. You cannot drive if your Right leg is in brace locked in extension.   Pain medication may make you constipated.  Below are a few solutions to try in this order:  o Decrease the amount of pain medication if you aren't having pain.  o Drink lots of decaffeinated fluids.  o Drink prune juice and/or eat dried prunes   o If the first 3 don't work start with additional solutions  o Take Colace - an over-the-counter stool softener  o Take Senokot -  an over-the-counter laxative  o Take Miralax - a stronger over-the-counter laxative    For more information including helpful videos and documents visit our website:   [email or web address]

## 2020-08-28 NOTE — Transfer of Care (Signed)
Immediate Anesthesia Transfer of Care Note  [name]  Procedure(s) Performed: KNEE ARTHROSCOPY WITH CHONDROPLASTY, MANIPULATION WITH LYSIS OF ADHESIONS, POSSIBLE MEDIAL MENISECTOMY (Left Knee) HARDWARE REMOVAL (Left )  Patient Location: PACU  Anesthesia Type:General  Level of Consciousness: awake, alert  and oriented  Airway & Oxygen Therapy: Patient Spontanous Breathing and Patient connected to face mask oxygen  Post-op Assessment: Report given to RN and Post -op Vital signs reviewed and stable  Post vital signs: Reviewed and stable  Last Vitals:  Vitals Value Taken Time  BP    Temp    Pulse    Resp    SpO2      Last Pain:  Vitals:   08/28/20 1044  PainSc: 3       Patients Stated Pain Goal: 2 (26/71/24 5809)  Complications: No complications documented.

## 2020-08-28 NOTE — Anesthesia Procedure Notes (Signed)
Procedure Name: LMA Insertion Date/Time: 08/28/2020 1:44 PM Performed by: Myna Bright, CRNA Pre-anesthesia Checklist: Patient identified, Emergency Drugs available, Suction available and Patient being monitored Patient Re-evaluated:Patient Re-evaluated prior to induction Oxygen Delivery Method: Circle system utilized Preoxygenation: Pre-oxygenation with 100% oxygen Induction Type: IV induction Ventilation: Mask ventilation without difficulty LMA: LMA inserted LMA Size: 4.0 Tube type: Oral Number of attempts: 1 Placement Confirmation: positive ETCO2 and breath sounds checked- equal and bilateral Tube secured with: Tape Dental Injury: Teeth and Oropharynx as per pre-operative assessment

## 2020-08-28 NOTE — Anesthesia Preprocedure Evaluation (Addendum)
Anesthesia Evaluation  Patient identified by MRN, date of birth, ID band Patient awake    Reviewed: Allergy & Precautions, NPO status , Patient's Chart, lab work & pertinent test results  History of Anesthesia Complications Negative for: history of anesthetic complications  Airway Mallampati: III  TM Distance: >3 FB Neck ROM: Full    Dental no notable dental hx. (+) Dental Advisory Given   Pulmonary Current Smoker,    Pulmonary exam normal        Cardiovascular hypertension, Pt. on medications Normal cardiovascular exam     Neuro/Psych PSYCHIATRIC DISORDERS Anxiety Depression negative neurological ROS     GI/Hepatic negative GI ROS, Neg liver ROS,   Endo/Other  Morbid obesity  Renal/GU negative Renal ROS     Musculoskeletal negative musculoskeletal ROS (+)   Abdominal (+) + obese,   Peds  Hematology negative hematology ROS (+)   Anesthesia Other Findings   Reproductive/Obstetrics                            Anesthesia Physical  Anesthesia Plan  ASA: III  Anesthesia Plan: General   Post-op Pain Management:  Regional for Post-op pain   Induction: Intravenous  PONV Risk Score and Plan: 4 or greater and Ondansetron, Dexamethasone, Midazolam and Scopolamine patch - Pre-op  Airway Management Planned: LMA  Additional Equipment: None  Intra-op Plan:   Post-operative Plan: Extubation in OR  Informed Consent: I have reviewed the patients History and Physical, chart, labs and discussed the procedure including the risks, benefits and alternatives for the proposed anesthesia with the patient or authorized representative who has indicated his/her understanding and acceptance.     Dental advisory given  Plan Discussed with: Anesthesiologist and CRNA  Anesthesia Plan Comments: (    )       Anesthesia Quick Evaluation

## 2020-08-28 NOTE — Anesthesia Postprocedure Evaluation (Signed)
Anesthesia Post Note  [name]  Procedure(s) Performed: KNEE ARTHROSCOPY WITH CHONDROPLASTY, MANIPULATION WITH LYSIS OF ADHESIONS, POSSIBLE MEDIAL MENISECTOMY (Left Knee) HARDWARE REMOVAL (Left )     Patient location during evaluation: PACU Anesthesia Type: General Level of consciousness: sedated Pain management: pain level controlled Vital Signs Assessment: post-procedure vital signs reviewed and stable Respiratory status: spontaneous breathing and respiratory function stable Cardiovascular status: stable Postop Assessment: no apparent nausea or vomiting Anesthetic complications: no   No complications documented.  Last Vitals:  Vitals:   08/28/20 1530 08/28/20 1545  BP: (!) 173/88 (!) 123/97  Pulse: 80 73  Resp: (!) 23 16  Temp:    SpO2: (!) 88% 98%    Last Pain:  Vitals:   08/28/20 1600  PainSc: 4                  Ethne Jeon DANIEL

## 2020-08-29 ENCOUNTER — Encounter (HOSPITAL_COMMUNITY): Payer: Self-pay | Admitting: Orthopaedic Surgery

## 2020-08-29 ENCOUNTER — Ambulatory Visit: Payer: BC Managed Care – PPO

## 2020-09-03 ENCOUNTER — Encounter: Payer: Self-pay | Admitting: Physical Therapy

## 2020-09-03 ENCOUNTER — Ambulatory Visit: Payer: BC Managed Care – PPO | Admitting: Physical Therapy

## 2020-09-03 ENCOUNTER — Ambulatory Visit: Payer: BC Managed Care – PPO | Attending: Physical Medicine & Rehabilitation | Admitting: Physical Therapy

## 2020-09-03 ENCOUNTER — Other Ambulatory Visit: Payer: Self-pay

## 2020-09-03 DIAGNOSIS — M25662 Stiffness of left knee, not elsewhere classified: Secondary | ICD-10-CM | POA: Insufficient documentation

## 2020-09-03 DIAGNOSIS — M25562 Pain in left knee: Secondary | ICD-10-CM | POA: Insufficient documentation

## 2020-09-03 DIAGNOSIS — R600 Localized edema: Secondary | ICD-10-CM | POA: Diagnosis present

## 2020-09-03 DIAGNOSIS — R262 Difficulty in walking, not elsewhere classified: Secondary | ICD-10-CM | POA: Insufficient documentation

## 2020-09-03 LAB — AEROBIC/ANAEROBIC CULTURE W GRAM STAIN (SURGICAL/DEEP WOUND)
Culture: NO GROWTH
Gram Stain: NONE SEEN

## 2020-09-03 NOTE — Therapy (Signed)
[facility]. [place], [place], [address] Phone: [phone]   Fax:  [phone]  Physical Therapy Evaluation  Patient Details  Name: [name] MRN: [id number] Date of Birth: 18-Jul-1955 Referring Provider (PT): Griffin Basil   Encounter Date: 09/03/2020   PT End of Session - 09/03/20 1520    Visit Number 1    Number of Visits 17    Date for PT Re-Evaluation 12/04/20    Authorization Type BCBS 30 visit limit total, did 13 visits prior to this surgery    PT Start Time 1355    PT Stop Time 1520    PT Time Calculation (min) 85 min    Activity Tolerance Patient tolerated treatment well;Patient limited by pain    Behavior During Therapy [facility] for tasks assessed/performed           Past Medical History:  Diagnosis Date  . Anxiety   . Arthritis   . Depression     Past Surgical History:  Procedure Laterality Date  . ABDOMINAL HYSTERECTOMY    . BREAST SURGERY     left lumpectomy   . gastric bypass surgery     . HARDWARE REMOVAL Left 08/28/2020   Procedure: HARDWARE REMOVAL;  Surgeon: Hiram Gash, MD;  Location: [facility];  Service: Orthopedics;  Laterality: Left;  . hemorrhjoid surgery     . KNEE ARTHROSCOPY WITH MEDIAL MENISECTOMY Left 08/28/2020   Procedure: KNEE ARTHROSCOPY WITH CHONDROPLASTY, MANIPULATION WITH LYSIS OF ADHESIONS, POSSIBLE MEDIAL MENISECTOMY;  Surgeon: Hiram Gash, MD;  Location: [facility];  Service: Orthopedics;  Laterality: Left;  . left femur fracture surgery      x 2  . ORIF TIBIA PLATEAU Left 02/26/2020   Procedure: OPEN REDUCTION INTERNAL FIXATION (ORIF) TIBIAL PLATEAU;  Surgeon: Shona Needles, MD;  Location: [facility];  Service: Orthopedics;  Laterality: Left;  . TUBAL LIGATION      There were no vitals filed for this visit.    Subjective Assessment - 09/03/20 1429    Subjective Patietn underwent a left knee harware removal and arthroscopy and manipulation on 08/28/20, she reports that she has an  infection and is being treated for this.  She reports that she has been in significant pain since the surgery.    Pertinent History L fx femur 2019. History of HTN. Obesity. Had COVID Feb 1st.    How long can you stand comfortably? no more than 3 minutes before increasing pain    How long can you walk comfortably? Using RW for short distances.    Diagnostic tests L hip CT 05/29/20: 1. Healed intertrochanteric femur fracture status post ORIF. No  hardware complication. 2. Mild right hip osteoarthritis.    Patient Stated Goals To be abe to get out and about on her own, to try and walk without walker, stand and cook, less pain and normal ROM    Currently in Pain? Yes    Pain Score 7     Pain Location Knee    Pain Orientation Left    Pain Descriptors / Indicators Aching;Constant    Pain Type Acute pain;Surgical pain    Pain Onset In the past 7 days    Pain Frequency Constant    Aggravating Factors  any motions, hurts all the time pain up to 9/10    Pain Relieving Factors ice rest pain at best 6/10    Effect of Pain on Daily Activities limits everything  [facility] PT Assessment - 09/03/20 0001      Assessment   Medical Diagnosis left knee hardware removal with infeciton and manipulation    Referring Provider (PT) Griffin Basil    Onset Date/Surgical Date 08/28/20    Hand Dominance Right    Prior Therapy for past surgery      Home Environment   Additional Comments house -1 level. 1 step up to front stoop but bought aa 1/2 step from [place], goes up stp with walker.  Have 1 step wit a ramp over to get into home from garage. Lives at home with husband who is older. Bathrooms with baths/tubs that must lift leg to get in but right now using a shower chair to help transfer. 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      Cognition   Overall Cognitive Status Within Functional Limits for tasks assessed      Observation/Other Assessments   Observations stitches in place, some oozing today, no bandage in place      AROM   Left Knee Extension 10    Left Knee Flexion 65      PROM   PROM Assessment Site Knee    Right/Left Knee Left    Left Knee Extension 0    Left Knee Flexion 72      Strength   Left Knee Flexion 3/5    Left Knee Extension 3/5      Palpation   Palpation comment significant bruising left medialknee and into the shin, and along the crease posterior knee      Transfers   Comments has to use the arms      Ambulation/Gait   Gait Comments using a FWW, slow, stiff on the left      Timed Up and Go Test   Normal TUG (seconds) 31    TUG Comments with FWW                      Objective measurements completed on examination: See above findings.       [facility] Adult PT Treatment/Exercise - 09/03/20 0001      Knee/Hip Exercises: Aerobic   Nustep L6 x 6 min      Knee/Hip Exercises: Standing   Heel Raises Both;2 sets;10 reps;2 seconds    Other Standing Knee Exercises MArch w/ RW 2x10      Knee/Hip Exercises: Seated   Long Arc Quad Left;2 sets;10 reps    Other Seated Knee/Hip Exercises hip Flex LLe 2x10    Hamstring Curl 2 sets;Strengthening;Both;15 reps    Hamstring Limitations red                  PT Education - 09/03/20 1519    Education Details seated knee flexion stretch and LAQ's    Person(s) Educated Patient    Methods Explanation;Demonstration;Tactile cues;Verbal cues    Comprehension Verbalized understanding;Returned demonstration;Verbal cues required            PT Short Term Goals - 09/03/20 1525      PT SHORT TERM GOAL #1   Title Independent with initial HEP    Time 2    Period Weeks    Status New  PT Long Term Goals - 09/03/20 1525      PT LONG TERM GOAL #1   Title Independent with  advanced HEP    Time 8    Period Weeks    Status New      PT LONG TERM GOAL #2   Title Pt will achieve L Knee ROM 0-110 with </= 2/10 pain to facilitate safe functional mobility.    Time 8    Period Weeks    Status New      PT LONG TERM GOAL #3   Title Pt will be able to ambulate with minimal asymmetries and no LOB with least restrictive assistive device (LRAD) with </= 1/10 pain .    Time 8    Period Weeks    Status New      PT LONG TERM GOAL #4   Title Pt will report improved standing tolerance with pain </= 1/10 and no LOB for cooking and hygeine activities in the home    Time 8    Period Weeks    Status New      PT LONG TERM GOAL #5   Title increase left knee strength to 4+/5    Time 8    Period Weeks    Status New                  Plan - 09/03/20 1522    Clinical Impression Statement Patient was a patient here due to left knee pain stemming from an ORIF last year, she was having pain and difficulty with this knee, she underwent a left knee scope with hardware removal and manipulation, there is a visit limit of 30 visits, we saw her 13 visits already this year.  The most recent surgery was 08/28/20.  She has stitches in place, she has some oozing, she has bruising of the knee, medially and posteriorly,  she uses a FWW, slow and stiff, TUG was 30 seconds    Personal Factors and Comorbidities Comorbidity 3+    Comorbidities Obesity, HTN, L femur fx history, Debility, chronic LBP    Examination-Activity Limitations Bathing;Bend;Caring for Others;Carry;Dressing;Hygiene/Grooming;Stand;Stairs;Squat;Locomotion Level;Transfers    Examination-Participation Restrictions Art gallery manager;Interpersonal Relationship;Meal Prep    Stability/Clinical Decision Making Evolving/Moderate complexity    Clinical Decision Making Moderate    Rehab Potential Good    PT Frequency 2x / week    PT Duration 12 weeks    PT Treatment/Interventions ADLs/Self Care Home  Management;Cryotherapy;Electrical Stimulation;Iontophoresis 4mg /ml Dexamethasone;Moist Heat;Gait training;DME Instruction;Neuromuscular re-education;Balance training;Therapeutic exercise;Therapeutic activities;Functional mobility training;Stair training;Patient/family education;Manual techniques;Energy conservation;Scar mobilization;Taping;Vasopneumatic Device    PT Next Visit Plan start working on ROM how that hardware is removed and she was manipulated    Consulted and Agree with Plan of Care Patient           Patient will benefit from skilled therapeutic intervention in order to improve the following deficits and impairments:  Abnormal gait,Decreased range of motion,Obesity,Decreased endurance,Difficulty walking,Decreased activity tolerance,Pain,Improper body mechanics,Impaired flexibility,Decreased balance,Decreased mobility,Decreased strength,Increased edema  Visit Diagnosis: Stiffness of left knee, not elsewhere classified - Plan: PT plan of care cert/re-cert  Acute pain of left knee - Plan: PT plan of care cert/re-cert  Difficulty in walking, not elsewhere classified - Plan: PT plan of care cert/re-cert  Localized edema - Plan: PT plan of care cert/re-cert     Problem List Patient Active Problem List   Diagnosis Date Noted  . Debility 05/02/2020  . Urinary incontinence 05/02/2020  . Pain in left shin   .  Urinary frequency   . Sleep disturbance   . Pain   . Anxiety state   . Urinary urgency   . Drug induced constipation   . Post-operative pain   . Benign essential HTN   . Pruritus   . Left medial tibial plateau fracture 03/01/2020  . Morbid obesity ([place])   . Acute blood loss anemia   . Postoperative pain   . Tibial plateau fracture 02/25/2020  . Tibial plateau fracture, left 02/25/2020  . Hyponatremia 02/25/2020  . Alcohol use disorder, moderate, dependence ([place]) 02/25/2020  . Essential hypertension 02/25/2020    Sumner Boast., PT 09/03/2020, 3:28  PM  [facility]. [place], [place], [address] Phone: [phone]   Fax:  [phone]  Name: [name] MRN: [id number] Date of Birth: [birth year]

## 2020-09-09 ENCOUNTER — Encounter (HOSPITAL_COMMUNITY): Payer: Self-pay | Admitting: Orthopaedic Surgery

## 2020-09-09 ENCOUNTER — Ambulatory Visit: Payer: BC Managed Care – PPO | Admitting: Physical Therapy

## 2020-09-09 ENCOUNTER — Other Ambulatory Visit: Payer: Self-pay

## 2020-09-09 NOTE — Progress Notes (Addendum)
COVID Vaccine Completed:  Yes x2 Date COVID Vaccine completed: Has received booster: COVID vaccine manufacturer:     Moderna     Date of COVID positive in last 90 days:  N/A  PCP - Suzanna Obey, MD Cardiologist - 10+ years ago for elevated BP in [place]  Chest x-ray - N/A EKG - 02-25-20 Epic Stress Test -  ECHO - 10+ years ago for elevated BP in [place]  Cardiac Cath -  Pacemaker/ICD device last checked: Spinal Cord Stimulator:  Sleep Study - N/A CPAP -   Fasting Blood Sugar - N/A Checks Blood Sugar _____ times a day  Blood Thinner Instructions:   Aspirin Instructions: ASA 81 mg BID Last Dose: 09-09-20  Activity level:  Unable to go up a flight of stairs without symptoms of knee pain.  Denies chest pain or shortness of breath with activities.     Anesthesia review: N/A  Patient denies shortness of breath, fever, cough and chest pain at PAT appointment (completed over the phone)   Patient verbalized understanding of instructions that were given to them at the PAT appointment. Patient was also instructed that they will need to review over the PAT instructions again at home before surgery.

## 2020-09-11 ENCOUNTER — Inpatient Hospital Stay (HOSPITAL_COMMUNITY)
Admission: RE | Admit: 2020-09-11 | Discharge: 2020-09-20 | DRG: 486 | Disposition: A | Discharge status: Skilled Nursing Facility | Payer: BC Managed Care – PPO | Attending: Orthopaedic Surgery | Admitting: Orthopaedic Surgery

## 2020-09-11 ENCOUNTER — Other Ambulatory Visit: Payer: Self-pay

## 2020-09-11 ENCOUNTER — Ambulatory Visit (HOSPITAL_COMMUNITY): Payer: BC Managed Care – PPO | Admitting: Anesthesiology

## 2020-09-11 ENCOUNTER — Encounter (HOSPITAL_COMMUNITY)
Admission: RE | Disposition: A | Discharge status: Skilled Nursing Facility | Payer: Self-pay | Source: Home / Self Care | Attending: Orthopaedic Surgery

## 2020-09-11 ENCOUNTER — Encounter (HOSPITAL_COMMUNITY): Payer: Self-pay | Admitting: Orthopaedic Surgery

## 2020-09-11 DIAGNOSIS — I10 Essential (primary) hypertension: Secondary | ICD-10-CM | POA: Diagnosis present

## 2020-09-11 DIAGNOSIS — Z8781 Personal history of (healed) traumatic fracture: Secondary | ICD-10-CM

## 2020-09-11 DIAGNOSIS — Z7982 Long term (current) use of aspirin: Secondary | ICD-10-CM

## 2020-09-11 DIAGNOSIS — L02419 Cutaneous abscess of limb, unspecified: Secondary | ICD-10-CM | POA: Diagnosis not present

## 2020-09-11 DIAGNOSIS — Z79899 Other long term (current) drug therapy: Secondary | ICD-10-CM

## 2020-09-11 DIAGNOSIS — Z90710 Acquired absence of both cervix and uterus: Secondary | ICD-10-CM

## 2020-09-11 DIAGNOSIS — L03119 Cellulitis of unspecified part of limb: Secondary | ICD-10-CM | POA: Diagnosis not present

## 2020-09-11 DIAGNOSIS — L02416 Cutaneous abscess of left lower limb: Secondary | ICD-10-CM | POA: Diagnosis present

## 2020-09-11 DIAGNOSIS — Z96659 Presence of unspecified artificial knee joint: Secondary | ICD-10-CM | POA: Diagnosis present

## 2020-09-11 DIAGNOSIS — F419 Anxiety disorder, unspecified: Secondary | ICD-10-CM | POA: Diagnosis present

## 2020-09-11 DIAGNOSIS — F17290 Nicotine dependence, other tobacco product, uncomplicated: Secondary | ICD-10-CM | POA: Diagnosis present

## 2020-09-11 DIAGNOSIS — R45851 Suicidal ideations: Secondary | ICD-10-CM | POA: Diagnosis present

## 2020-09-11 DIAGNOSIS — Z23 Encounter for immunization: Secondary | ICD-10-CM

## 2020-09-11 DIAGNOSIS — M009 Pyogenic arthritis, unspecified: Principal | ICD-10-CM | POA: Diagnosis present

## 2020-09-11 DIAGNOSIS — Z6841 Body Mass Index (BMI) 40.0 and over, adult: Secondary | ICD-10-CM

## 2020-09-11 DIAGNOSIS — G8918 Other acute postprocedural pain: Secondary | ICD-10-CM | POA: Diagnosis present

## 2020-09-11 DIAGNOSIS — S83249A Other tear of medial meniscus, current injury, unspecified knee, initial encounter: Secondary | ICD-10-CM | POA: Diagnosis present

## 2020-09-11 DIAGNOSIS — M1250 Traumatic arthropathy, unspecified site: Secondary | ICD-10-CM | POA: Diagnosis present

## 2020-09-11 DIAGNOSIS — M79622 Pain in left upper arm: Secondary | ICD-10-CM | POA: Diagnosis not present

## 2020-09-11 DIAGNOSIS — Z9181 History of falling: Secondary | ICD-10-CM

## 2020-09-11 DIAGNOSIS — G8929 Other chronic pain: Secondary | ICD-10-CM | POA: Diagnosis present

## 2020-09-11 DIAGNOSIS — M25512 Pain in left shoulder: Secondary | ICD-10-CM

## 2020-09-11 DIAGNOSIS — F32A Depression, unspecified: Secondary | ICD-10-CM | POA: Diagnosis present

## 2020-09-11 DIAGNOSIS — M779 Enthesopathy, unspecified: Secondary | ICD-10-CM | POA: Diagnosis present

## 2020-09-11 DIAGNOSIS — F411 Generalized anxiety disorder: Secondary | ICD-10-CM | POA: Diagnosis present

## 2020-09-11 DIAGNOSIS — Z20822 Contact with and (suspected) exposure to covid-19: Secondary | ICD-10-CM | POA: Diagnosis present

## 2020-09-11 DIAGNOSIS — M1712 Unilateral primary osteoarthritis, left knee: Secondary | ICD-10-CM | POA: Diagnosis present

## 2020-09-11 DIAGNOSIS — Z9884 Bariatric surgery status: Secondary | ICD-10-CM

## 2020-09-11 DIAGNOSIS — M7592 Shoulder lesion, unspecified, left shoulder: Secondary | ICD-10-CM | POA: Diagnosis present

## 2020-09-11 DIAGNOSIS — M62838 Other muscle spasm: Secondary | ICD-10-CM | POA: Diagnosis present

## 2020-09-11 HISTORY — PX: IRRIGATION AND DEBRIDEMENT ABSCESS: SHX5252

## 2020-09-11 LAB — CBC
HCT: 45.7 % (ref 36.0–46.0)
Hemoglobin: 14.2 g/dL (ref 12.0–15.0)
MCH: 30 pg (ref 26.0–34.0)
MCHC: 31.1 g/dL (ref 30.0–36.0)
MCV: 96.4 fL (ref 80.0–100.0)
Platelets: 266 10*3/uL (ref 150–400)
RBC: 4.74 MIL/uL (ref 3.87–5.11)
RDW: 16.6 % — ABNORMAL HIGH (ref 11.5–15.5)
WBC: 11.5 10*3/uL — ABNORMAL HIGH (ref 4.0–10.5)
nRBC: 0 % (ref 0.0–0.2)

## 2020-09-11 LAB — CREATININE, SERUM
Creatinine, Ser: 0.64 mg/dL (ref 0.44–1.00)
GFR, Estimated: >60 mL/min (ref >60)

## 2020-09-11 LAB — SARS CORONAVIRUS 2 BY RT PCR (HOSPITAL ORDER, PERFORMED IN ~~LOC~~ HOSPITAL LAB): SARS Coronavirus 2: NEGATIVE

## 2020-09-11 LAB — GLUCOSE, CAPILLARY
Glucose-Capillary: 186 mg/dL — ABNORMAL HIGH (ref 70–99)
Glucose-Capillary: 214 mg/dL — ABNORMAL HIGH (ref 70–99)

## 2020-09-11 SURGERY — MINOR INCISION AND DRAINAGE OF ABSCESS
Anesthesia: General | Site: Knee | Laterality: Left

## 2020-09-11 MED ORDER — SODIUM CHLORIDE 0.9 % IV SOLN
2.0000 g | INTRAVENOUS | Status: DC
  Administered 2020-09-11 – 2020-09-19 (×9): 2 g via INTRAVENOUS
  Filled 2020-09-11: qty 2
  Filled 2020-09-11 (×4): qty 20
  Filled 2020-09-11: qty 2
  Filled 2020-09-11: qty 20
  Filled 2020-09-11: qty 2
  Filled 2020-09-11 (×2): qty 20

## 2020-09-11 MED ORDER — CELECOXIB 200 MG PO CAPS
200.0000 mg | ORAL_CAPSULE | Freq: Once | ORAL | Status: AC
  Administered 2020-09-11: 200 mg via ORAL
  Filled 2020-09-11: qty 1

## 2020-09-11 MED ORDER — DEXAMETHASONE SODIUM PHOSPHATE 10 MG/ML IJ SOLN
INTRAMUSCULAR | Status: DC | PRN
  Administered 2020-09-11: 10 mg via INTRAVENOUS

## 2020-09-11 MED ORDER — BUPIVACAINE HCL 0.25 % IJ SOLN
INTRAMUSCULAR | Status: AC
  Filled 2020-09-11: qty 1

## 2020-09-11 MED ORDER — VANCOMYCIN HCL 1000 MG/200ML IV SOLN
1000.0000 mg | Freq: Once | INTRAVENOUS | Status: AC
  Administered 2020-09-11: 1000 mg via INTRAVENOUS
  Filled 2020-09-11: qty 200

## 2020-09-11 MED ORDER — HYDROMORPHONE HCL 1 MG/ML IJ SOLN
0.2500 mg | INTRAMUSCULAR | Status: DC | PRN
  Administered 2020-09-11 (×4): 0.5 mg via INTRAVENOUS

## 2020-09-11 MED ORDER — CHLORHEXIDINE GLUCONATE 0.12 % MT SOLN
15.0000 mL | Freq: Once | OROMUCOSAL | Status: AC
  Administered 2020-09-11: 15 mL via OROMUCOSAL

## 2020-09-11 MED ORDER — POLYETHYLENE GLYCOL 3350 17 G PO PACK: 17.0000 g | PACK | Freq: Every day | ORAL | Status: DC | PRN

## 2020-09-11 MED ORDER — 0.9 % SODIUM CHLORIDE (POUR BTL) OPTIME
TOPICAL | Status: DC | PRN
  Administered 2020-09-11: 1000 mL

## 2020-09-11 MED ORDER — DOCUSATE SODIUM 100 MG PO CAPS
100.0000 mg | ORAL_CAPSULE | Freq: Two times a day (BID) | ORAL | Status: DC
  Administered 2020-09-11 – 2020-09-20 (×17): 100 mg via ORAL
  Filled 2020-09-11 (×17): qty 1

## 2020-09-11 MED ORDER — PROPOFOL 10 MG/ML IV BOLUS
INTRAVENOUS | Status: DC | PRN
  Administered 2020-09-11: 150 mg via INTRAVENOUS

## 2020-09-11 MED ORDER — FENTANYL CITRATE (PF) 100 MCG/2ML IJ SOLN
INTRAMUSCULAR | Status: AC
  Filled 2020-09-11: qty 2

## 2020-09-11 MED ORDER — FENTANYL CITRATE (PF) 100 MCG/2ML IJ SOLN
INTRAMUSCULAR | Status: DC | PRN
  Administered 2020-09-11 (×2): 50 ug via INTRAVENOUS

## 2020-09-11 MED ORDER — ONDANSETRON HCL 4 MG/2ML IJ SOLN
INTRAMUSCULAR | Status: DC | PRN
  Administered 2020-09-11: 4 mg via INTRAVENOUS

## 2020-09-11 MED ORDER — HYDROMORPHONE HCL 1 MG/ML IJ SOLN: 0.5000 mg | INTRAMUSCULAR | Status: DC | PRN

## 2020-09-11 MED ORDER — ORAL CARE MOUTH RINSE: 15.0000 mL | Freq: Once | OROMUCOSAL | Status: AC

## 2020-09-11 MED ORDER — ONDANSETRON HCL 4 MG/2ML IJ SOLN: 4.0000 mg | Freq: Four times a day (QID) | INTRAMUSCULAR | Status: DC | PRN

## 2020-09-11 MED ORDER — ACETAMINOPHEN 500 MG PO TABS
1000.0000 mg | ORAL_TABLET | Freq: Three times a day (TID) | ORAL | Status: AC
  Administered 2020-09-11 – 2020-09-12 (×4): 1000 mg via ORAL
  Filled 2020-09-11 (×4): qty 2

## 2020-09-11 MED ORDER — ONDANSETRON HCL 4 MG PO TABS
4.0000 mg | ORAL_TABLET | Freq: Four times a day (QID) | ORAL | Status: DC | PRN
  Administered 2020-09-13: 4 mg via ORAL
  Filled 2020-09-11: qty 1

## 2020-09-11 MED ORDER — LIDOCAINE 2% (20 MG/ML) 5 ML SYRINGE
INTRAMUSCULAR | Status: DC | PRN
  Administered 2020-09-11: 40 mg via INTRAVENOUS

## 2020-09-11 MED ORDER — METOCLOPRAMIDE HCL 5 MG/ML IJ SOLN: 5.0000 mg | Freq: Three times a day (TID) | INTRAMUSCULAR | Status: DC | PRN

## 2020-09-11 MED ORDER — LACTATED RINGERS IV SOLN: INTRAVENOUS | Status: DC

## 2020-09-11 MED ORDER — BISACODYL 5 MG PO TBEC: 5.0000 mg | DELAYED_RELEASE_TABLET | Freq: Every day | ORAL | Status: DC | PRN

## 2020-09-11 MED ORDER — VANCOMYCIN HCL 1250 MG/250ML IV SOLN
1250.0000 mg | Freq: Two times a day (BID) | INTRAVENOUS | Status: DC
  Administered 2020-09-12 – 2020-09-13 (×4): 1250 mg via INTRAVENOUS
  Filled 2020-09-11 (×5): qty 250

## 2020-09-11 MED ORDER — PANTOPRAZOLE SODIUM 40 MG PO TBEC
40.0000 mg | DELAYED_RELEASE_TABLET | Freq: Every day | ORAL | Status: DC
  Administered 2020-09-11 – 2020-09-20 (×10): 40 mg via ORAL
  Filled 2020-09-11 (×10): qty 1

## 2020-09-11 MED ORDER — SODIUM CHLORIDE 0.9 % IR SOLN
Status: DC | PRN
  Administered 2020-09-11: 6000 mL

## 2020-09-11 MED ORDER — ENOXAPARIN SODIUM 40 MG/0.4ML IJ SOSY
40.0000 mg | PREFILLED_SYRINGE | INTRAMUSCULAR | Status: DC
  Administered 2020-09-12 – 2020-09-20 (×9): 40 mg via SUBCUTANEOUS
  Filled 2020-09-11 (×9): qty 0.4

## 2020-09-11 MED ORDER — DIPHENHYDRAMINE HCL 12.5 MG/5ML PO ELIX
12.5000 mg | ORAL_SOLUTION | ORAL | Status: DC | PRN
  Administered 2020-09-12 – 2020-09-20 (×10): 25 mg via ORAL
  Filled 2020-09-11 (×10): qty 10

## 2020-09-11 MED ORDER — HYDROMORPHONE HCL 1 MG/ML IJ SOLN
INTRAMUSCULAR | Status: AC
  Filled 2020-09-11: qty 1

## 2020-09-11 MED ORDER — BUPIVACAINE HCL 0.25 % IJ SOLN
INTRAMUSCULAR | Status: DC | PRN
  Administered 2020-09-11: 30 mL

## 2020-09-11 MED ORDER — VANCOMYCIN HCL IN DEXTROSE 1-5 GM/200ML-% IV SOLN
1000.0000 mg | Freq: Two times a day (BID) | INTRAVENOUS | Status: DC
  Administered 2020-09-11: 1000 mg via INTRAVENOUS
  Filled 2020-09-11: qty 200

## 2020-09-11 MED ORDER — FENTANYL CITRATE (PF) 100 MCG/2ML IJ SOLN
25.0000 ug | INTRAMUSCULAR | Status: DC | PRN
  Administered 2020-09-11 (×3): 50 ug via INTRAVENOUS

## 2020-09-11 MED ORDER — VANCOMYCIN HCL 1000 MG IV SOLR
INTRAVENOUS | Status: AC
  Filled 2020-09-11: qty 1000

## 2020-09-11 MED ORDER — ZOLPIDEM TARTRATE 5 MG PO TABS
5.0000 mg | ORAL_TABLET | Freq: Every evening | ORAL | Status: DC | PRN
  Administered 2020-09-11 – 2020-09-18 (×7): 5 mg via ORAL
  Filled 2020-09-11 (×8): qty 1

## 2020-09-11 MED ORDER — TOBRAMYCIN SULFATE 1.2 G IJ SOLR
INTRAMUSCULAR | Status: AC
  Filled 2020-09-11: qty 1.2

## 2020-09-11 MED ORDER — KETOROLAC TROMETHAMINE 15 MG/ML IJ SOLN
15.0000 mg | Freq: Four times a day (QID) | INTRAMUSCULAR | Status: AC
  Administered 2020-09-11 – 2020-09-12 (×4): 15 mg via INTRAVENOUS
  Filled 2020-09-11 (×4): qty 1

## 2020-09-11 MED ORDER — FENTANYL CITRATE (PF) 250 MCG/5ML IJ SOLN
INTRAMUSCULAR | Status: DC | PRN
  Administered 2020-09-11 (×5): 50 ug via INTRAVENOUS

## 2020-09-11 MED ORDER — METHOCARBAMOL 500 MG PO TABS
500.0000 mg | ORAL_TABLET | Freq: Four times a day (QID) | ORAL | Status: DC | PRN
  Administered 2020-09-11 – 2020-09-19 (×19): 500 mg via ORAL
  Filled 2020-09-11 (×19): qty 1

## 2020-09-11 MED ORDER — CEFAZOLIN IN SODIUM CHLORIDE 3-0.9 GM/100ML-% IV SOLN
3.0000 g | INTRAVENOUS | Status: AC
  Administered 2020-09-11: 3 g via INTRAVENOUS
  Filled 2020-09-11: qty 100

## 2020-09-11 MED ORDER — TOBRAMYCIN SULFATE 1.2 G IJ SOLR
INTRAMUSCULAR | Status: DC | PRN
  Administered 2020-09-11: 1.2 g via TOPICAL

## 2020-09-11 MED ORDER — ACETAMINOPHEN 500 MG PO TABS: 1000.0000 mg | ORAL_TABLET | Freq: Once | ORAL | Status: DC

## 2020-09-11 MED ORDER — METOCLOPRAMIDE HCL 5 MG PO TABS: 5.0000 mg | ORAL_TABLET | Freq: Three times a day (TID) | ORAL | Status: DC | PRN

## 2020-09-11 MED ORDER — ACETAMINOPHEN 500 MG PO TABS
1000.0000 mg | ORAL_TABLET | Freq: Once | ORAL | Status: AC
  Administered 2020-09-11: 1000 mg via ORAL
  Filled 2020-09-11: qty 2

## 2020-09-11 MED ORDER — SCOPOLAMINE 1 MG/3DAYS TD PT72
1.0000 | MEDICATED_PATCH | TRANSDERMAL | Status: DC
  Administered 2020-09-11: 1.5 mg via TRANSDERMAL
  Filled 2020-09-11: qty 1

## 2020-09-11 MED ORDER — PROPOFOL 10 MG/ML IV BOLUS
INTRAVENOUS | Status: AC
  Filled 2020-09-11: qty 20

## 2020-09-11 MED ORDER — METHOCARBAMOL 500 MG IVPB - SIMPLE MED
500.0000 mg | Freq: Four times a day (QID) | INTRAVENOUS | Status: DC | PRN
  Filled 2020-09-11: qty 50

## 2020-09-11 MED ORDER — FENTANYL CITRATE (PF) 250 MCG/5ML IJ SOLN
INTRAMUSCULAR | Status: AC
  Filled 2020-09-11: qty 5

## 2020-09-11 MED ORDER — MIDAZOLAM HCL 2 MG/2ML IJ SOLN
INTRAMUSCULAR | Status: AC
  Filled 2020-09-11: qty 2

## 2020-09-11 MED ORDER — MIDAZOLAM HCL 2 MG/2ML IJ SOLN
INTRAMUSCULAR | Status: DC | PRN
  Administered 2020-09-11 (×2): 1 mg via INTRAVENOUS

## 2020-09-11 MED ORDER — OXYCODONE HCL 5 MG PO TABS
5.0000 mg | ORAL_TABLET | ORAL | Status: DC | PRN
  Administered 2020-09-12 – 2020-09-16 (×5): 10 mg via ORAL
  Filled 2020-09-11: qty 2
  Filled 2020-09-11: qty 1
  Filled 2020-09-11 (×7): qty 2

## 2020-09-11 MED ORDER — MAGNESIUM CITRATE PO SOLN: 1.0000 | Freq: Once | ORAL | Status: DC | PRN

## 2020-09-11 MED ORDER — OXYCODONE HCL 5 MG PO TABS
10.0000 mg | ORAL_TABLET | ORAL | Status: DC | PRN
  Administered 2020-09-11 (×2): 15 mg via ORAL
  Administered 2020-09-12 (×2): 10 mg via ORAL
  Administered 2020-09-12 – 2020-09-17 (×17): 15 mg via ORAL
  Administered 2020-09-17: 10 mg via ORAL
  Administered 2020-09-17 – 2020-09-19 (×8): 15 mg via ORAL
  Filled 2020-09-11 (×5): qty 3
  Filled 2020-09-11: qty 2
  Filled 2020-09-11 (×12): qty 3
  Filled 2020-09-11: qty 2
  Filled 2020-09-11 (×9): qty 3

## 2020-09-11 MED ORDER — VANCOMYCIN HCL 1000 MG IV SOLR
INTRAVENOUS | Status: DC | PRN
  Administered 2020-09-11: 1000 mg via TOPICAL

## 2020-09-11 SURGICAL SUPPLY — 55 items
BNDG ELASTIC 4X5.8 VLCR STR LF (GAUZE/BANDAGES/DRESSINGS) IMPLANT
BNDG ELASTIC 6X10 VLCR STRL LF (GAUZE/BANDAGES/DRESSINGS) ×2 IMPLANT
BNDG ELASTIC 6X5.8 VLCR STR LF (GAUZE/BANDAGES/DRESSINGS) IMPLANT
BNDG GAUZE ELAST 4 BULKY (GAUZE/BANDAGES/DRESSINGS) IMPLANT
CANISTER WOUNDNEG PRESSURE 500 (CANNISTER) ×2 IMPLANT
CNTNR URN SCR LID CUP LEK RST (MISCELLANEOUS) ×3 IMPLANT
CONT SPEC 4OZ STRL OR WHT (MISCELLANEOUS) ×6
COOLER ICEMAN CLASSIC (MISCELLANEOUS) ×2 IMPLANT
COVER SURGICAL LIGHT HANDLE (MISCELLANEOUS) ×2 IMPLANT
COVER WAND RF STERILE (DRAPES) ×2 IMPLANT
CUFF TOURN SGL QUICK 24 (TOURNIQUET CUFF)
CUFF TOURN SGL QUICK 34 (TOURNIQUET CUFF)
CUFF TRNQT CYL 24X4X16.5-23 (TOURNIQUET CUFF) IMPLANT
CUFF TRNQT CYL 34X4.125X (TOURNIQUET CUFF) IMPLANT
DRAPE U-SHAPE 47X51 STRL (DRAPES) IMPLANT
DRESSING PEEL AND PLC PRVNA 13 (GAUZE/BANDAGES/DRESSINGS) ×1 IMPLANT
DRSG PAD ABDOMINAL 8X10 ST (GAUZE/BANDAGES/DRESSINGS) ×2 IMPLANT
DRSG PEEL AND PLACE PREVENA 13 (GAUZE/BANDAGES/DRESSINGS) ×2
DURAPREP 26ML APPLICATOR (WOUND CARE) ×4 IMPLANT
ELECT REM PT RETURN 15FT ADLT (MISCELLANEOUS) IMPLANT
EVACUATOR 1/8 PVC DRAIN (DRAIN) ×2 IMPLANT
GAUZE SPONGE 4X4 12PLY STRL (GAUZE/BANDAGES/DRESSINGS) ×2 IMPLANT
GAUZE XEROFORM 1X8 LF (GAUZE/BANDAGES/DRESSINGS) ×2 IMPLANT
GLOVE SRG 8 PF TXTR STRL LF DI (GLOVE) ×1 IMPLANT
GLOVE SURG ENC MOIS LTX SZ6.5 (GLOVE) ×2 IMPLANT
GLOVE SURG POLYISO LF SZ8 (GLOVE) ×4 IMPLANT
GLOVE SURG UNDER LTX SZ6.5 (GLOVE) ×2 IMPLANT
GLOVE SURG UNDER POLY LF SZ8 (GLOVE) ×2
GOWN STRL REUS W/ TWL LRG LVL3 (GOWN DISPOSABLE) ×2 IMPLANT
GOWN STRL REUS W/ TWL XL LVL3 (GOWN DISPOSABLE) ×1 IMPLANT
GOWN STRL REUS W/TWL LRG LVL3 (GOWN DISPOSABLE) ×4
GOWN STRL REUS W/TWL XL LVL3 (GOWN DISPOSABLE) ×2
HANDPIECE INTERPULSE COAX TIP (DISPOSABLE)
KIT BASIN OR (CUSTOM PROCEDURE TRAY) ×2 IMPLANT
KIT DRSG PREVENA PLUS 7DAY 125 (MISCELLANEOUS) ×2 IMPLANT
KIT TURNOVER KIT A (KITS) ×2 IMPLANT
MANIFOLD NEPTUNE II (INSTRUMENTS) ×2 IMPLANT
NS IRRIG 1000ML POUR BTL (IV SOLUTION) ×2 IMPLANT
PACK ORTHO EXTREMITY (CUSTOM PROCEDURE TRAY) ×2 IMPLANT
PAD ARMBOARD 7.5X6 YLW CONV (MISCELLANEOUS) ×4 IMPLANT
PAD CAST 4YDX4 CTTN HI CHSV (CAST SUPPLIES) IMPLANT
PAD COLD SHLDR WRAP-ON (PAD) ×2 IMPLANT
PADDING CAST COTTON 4X4 STRL (CAST SUPPLIES)
PADDING CAST COTTON 6X4 STRL (CAST SUPPLIES) IMPLANT
PADDING CAST SYN 6 (CAST SUPPLIES) ×1
PADDING CAST SYNTHETIC 6X4 NS (CAST SUPPLIES) ×1 IMPLANT
SET HNDPC FAN SPRY TIP SCT (DISPOSABLE) IMPLANT
SPONGE LAP 18X18 RF (DISPOSABLE) IMPLANT
SUT ETHILON 2 0 FS 18 (SUTURE) ×2 IMPLANT
SWAB CULTURE ESWAB REG 1ML (MISCELLANEOUS) IMPLANT
TOWEL OR 17X26 10 PK STRL BLUE (TOWEL DISPOSABLE) ×2 IMPLANT
TOWEL OR NON WOVEN STRL DISP B (DISPOSABLE) ×2 IMPLANT
TUBING CONNECTING 10 (TUBING) ×2 IMPLANT
UNDERPAD 30X36 HEAVY ABSORB (UNDERPADS AND DIAPERS) ×2 IMPLANT
YANKAUER SUCT BULB TIP NO VENT (SUCTIONS) ×2 IMPLANT

## 2020-09-11 NOTE — Consult Note (Signed)
[name] for Infectious Disease       Reason for Consult: left superficial abscess and possible septic joint   Referring Physician: Dr. Griffin Basil  Active Problems:   Cellulitis and abscess of lower extremity   . acetaminophen  1,000 mg Oral Q8H  . docusate sodium  100 mg Oral BID  . [START ON 09/12/2020] enoxaparin (LOVENOX) injection  40 mg Subcutaneous Q24H  . fentaNYL      . fentaNYL      . HYDROmorphone      . HYDROmorphone      . ketorolac  15 mg Intravenous Q6H  . pantoprazole  40 mg Oral Daily    Recommendations:  vancomycin and ceftriaxone Monitor cultures  Assessment: She has a recent fracture of the left tibial plateau and ORIF and now noted infection at the area of the hardware, which was previously removed, and concern for septic arthritis.   Will treat as above and monitor cultures for growth and narrow as indicated.   Antibiotics: Perioperative cefazolin  HPI: [name] is a 65 y.o. female with morbid obesity, history of left tibial plateau fracture with open reduction and internal fixation in November of 2021 who developed more pain, minimally improved with injections and taken to the OR on 08/28/20 by Dr. Griffin Basil for hardware removal, debridement of abscess and lysis of adhesions.  Culture was negative.   No signs of infection of the joint at that time and here today for a repeat washout.  Operatively, she was found to have an infected hematoma deep to her fascia at the previous medial hardware site and perioperatively there was concern for septic joint and the joint was opened and washed out.  Cultures have been sent.  Previous femur fracture in 2019 and feels she has had pain from that every since.  She has recently been on Bactrim and doxycycline since 5/11.    Review of Systems:  Constitutional: negative for fevers and chills Gastrointestinal: negative for nausea and diarrhea Integument/breast: negative for rash All other systems reviewed and are  negative    Past Medical History:  Diagnosis Date  . Anxiety   . Arthritis   . Depression     Social History   Tobacco Use  . Smoking status: Current Every Day Smoker    Types: Cigars  . Smokeless tobacco: Never Used  Vaping Use  . Vaping Use: Former  . Substances: Nicotine, Flavoring  Substance Use Topics  . Alcohol use: Yes    Comment: 12 beers per week   . Drug use: Never    [place]: + cardiac disease  No Known Allergies  Physical Exam: Constitutional: in no apparent distress  Vitals:   09/11/20 1445 09/11/20 1459  BP: (!) 165/72 (!) 161/75  Pulse:  84  Resp: (!) 23 20  Temp:  (!) 97.5 F (36.4 C)  SpO2: 98% 92%   EYES: anicteric Cardiovascular: Cor RRR Respiratory: clear; Skin: negatives: no rash Neuro: non-focal  Lab Results  Component Value Date   WBC 9.0 08/27/2020   HGB 13.1 08/27/2020   HCT 40.6 08/27/2020   MCV 93.3 08/27/2020   PLT 290 08/27/2020    Lab Results  Component Value Date   CREATININE 0.54 08/27/2020   BUN 10 08/27/2020   NA 137 08/27/2020   K 4.9 08/27/2020   CL 102 08/27/2020   CO2 30 08/27/2020    Lab Results  Component Value Date   ALT 20 03/04/2020   AST 15 03/04/2020  [facility] 68 03/04/2020     Microbiology: Recent Results (from the past 240 hour(s))  SARS Coronavirus 2 by RT PCR (hospital order, performed in [facility] hospital lab) Nasopharyngeal Nasopharyngeal Swab     Status: None   Collection Time: 09/11/20  8:24 AM   Specimen: Nasopharyngeal Swab  Result Value Ref Range Status   SARS Coronavirus 2 NEGATIVE NEGATIVE Final    Comment: Performed at [facility], [place] [address], [place], [place] [address]    Keeon Zurn W Thorvald Orsino, MD [facility] for Infectious Disease [facility] [email or web address] 09/11/2020, 3:55 PM

## 2020-09-11 NOTE — Transfer of Care (Signed)
Immediate Anesthesia Transfer of Care Note  [name]  Procedure(s) Performed: Irrigation and debridement of deep abscess- left leg, septic joint (Left Knee)  Patient Location: PACU  Anesthesia Type:General  Level of Consciousness: awake and alert   Airway & Oxygen Therapy: Patient Spontanous Breathing and Patient connected to face mask oxygen  Post-op Assessment: Report given to RN and Post -op Vital signs reviewed and stable  Post vital signs: Reviewed and stable  Last Vitals:  Vitals Value Taken Time  BP    Temp    Pulse 89 09/11/20 1317  Resp 13 09/11/20 1317  SpO2 100 % 09/11/20 1317  Vitals shown include unvalidated device data.  Last Pain:  Vitals:   09/11/20 0859  TempSrc: Oral  PainSc:       Patients Stated Pain Goal: 4 (14/38/88 7579)  Complications: No complications documented.

## 2020-09-11 NOTE — Op Note (Signed)
Orthopaedic Surgery Operative Note (CSN: [id number])  [name]  [birth year] Date of Surgery: 09/11/2020   Diagnoses:  Left knee superficial abscess and septic joint  Procedure: Left knee incision and debridement of deep abscess Left knee septic joint incision and debridement   Operative Finding Successful completion of the planned procedure.  Patient had a clear infected hematoma deep to her fascia at the medial hardware site.  There was at the time of her hardware removal signs of infection so this was not particularly surprising however the fact that she got worse after hardware removal is a bit atypical.  She had a dry tap of her knee that we did prior to opening the wound and we felt that there was low suspicion for septic joint at that point however when we opened her medial parapatellar incision our site of our previous closure of her knee scope portal within this wound was concerning and thus we opened up the knee and treated as if it was septic.  Washed out the knee and the deep abscess with 6 L of saline.  1 drain in the deep dead space and another drain in the knee itself.  These will stay for 2 days approximately.  Infectious disease consult pending.  Post-operative plan: The patient will be weightbearing to tolerance.  The patient will be admitted to the floor and likely to skilled nursing.  DVT prophylaxis Lovenox 40 mg/day until mobilizing and then consider transition in clinic to alternative medicines.   Pain control with PRN pain medication preferring oral medicines.  Follow up plan will be scheduled in approximately 7 days for incision check and XR.  Post-Op Diagnosis: Same Surgeons:Primary: Hiram Gash, MD Assistants:Caroline McBane PA-C Location: [facility] 09 Anesthesia: General with local anesthesia Antibiotics: Ancef 3 g with local vancomycin 1 g at the surgical site 1.2 g of tobramycin powder as well locally applied Tourniquet time: * No tourniquets in log * Estimated  Blood Loss: Minimal Complications: None Specimens: 2 for culture Implants: * No implants in log *  Indications for Surgery:   [name] is a 65 y.o. female with previous ORIF of the plateau in a patient with a BMI of greater than 50 and subsequent hardware removal knee scope with concern for infection at her hardware at her last surgery 2 weeks ago.  She had worsening pain and concern for infection.  Ultrasound demonstrated deep abscess.  benefits and risks of operative and nonoperative management were discussed prior to surgery with patient/guardian(s) and informed consent form was completed.  Specific risks including infection, need for additional surgery, need for further surgery, revision debridement amongst others.   Procedure:   The patient was identified properly. Informed consent was obtained and the surgical site was marked. The patient was taken up to suite where general anesthesia was induced.  The patient was positioned supine on a regular bed.  The left knee was prepped and draped in the usual sterile fashion.  Timeout was performed before the beginning of the case.  No tourniquet used.  We initially began by trying to aspirate the knee and there was very minimal fluid in the knee.  It was unclear on whether due to the patient's body habitus we were clearly in the knee joint however it appeared to be and there was only minimal fluid and no obvious purulence.  At that point we were unsure of whether we would consider the septic joint or not.  We began by opening her previous incision that  was a medial parapatellar.  We opened it after removing the sutures and noted immediate evacuation of infected hematoma and purulent drainage.  We took multiple areas for specimen.  That point we performed excisional debridement as described below.  There is a track around to the posterior aspect of the medial knee that the infection had clearly made.  There is an obvious dead space as well.  We  irrigated with 6 L of saline and then identified that there was an area where we had previously placed a deep stitch to close the portal from our knee arthroscopy.  This area unfortunately looked unhealthy and there seem to be purulent material in this area and were worried there was a septic joint in addition to the abscess.  We worried that if we left this alone she may end up occurring.  We opened the medial parapatellar approach and open the knee joint at that point were able to irrigate within the knee joint other 3 L.  We irrigated the wound copiously before placing local antibiotic as listed above.  We closed the incision in a multilayer fashion with nonabsorbable suture.  Sterile dressing was placed in the form of Prevena VAC and multiple Hemovacs were placed 1 in the knee and 1 in the dead space of the proximal tibia.  Patient was awoken taken to PACU in stable condition.  Debridement type: Excisional Debridement  Side: left  Body Location: knee and tibia   Tools used for debridement: scissors, curette and rongeur  Pre-debridement Wound size (cm):   Length: 12        Width: 6    Depth: 9   Post-debridement Wound size (cm):   Length: 12        Width: 6     Depth: 9   Debridement depth beyond dead/damaged tissue down to healthy viable tissue: yes  Tissue layer involved: skin, subcutaneous tissue, muscle / fascia  Nature of tissue removed: Slough, Necrotic, Devitalized Tissue and Purulence  Irrigation volume: 6L     Irrigation fluid type: Normal Saline       [employer], PA-C, present and scrubbed throughout the case, critical for completion in a timely fashion, and for retraction, instrumentation, closure.

## 2020-09-11 NOTE — Interval H&P Note (Signed)
History and Physical Interval Note:  09/11/2020 9:33 AM  [name]  has presented today for surgery, with the diagnosis of ABCESS LT KNEE.  The various methods of treatment have been discussed with the patient and family. After consideration of risks, benefits and other options for treatment, the patient has consented to  Procedure(s): MINOR INCISION AND DRAINAGE OF ABSCESS (Left) as a surgical intervention.  The patient's history has been reviewed, patient examined, no change in status, stable for surgery.  I have reviewed the patient's chart and labs.  Questions were answered to the patient's satisfaction.     Hiram Gash

## 2020-09-11 NOTE — H&P (Signed)
PREOPERATIVE H&P  Chief Complaint: ABCESS LT KNEE  HPI: [name] is a 65 y.o. female who presents for preoperative history and physical prior to scheduled surgery, Procedure(s): MINOR INCISION AND DRAINAGE OF ABSCESS.   Patient has a past medical history significant for HTN.   Patient had a left knee surgery with proximal tibial hardware removal, tibial incision debridement of deep abscess., left knee scope with medial femoral chondroplasty, lysis of adhesions and manipulation under anesthesia on 08/28/2020. There was concern about infection of the hardware as there was an area of purulent material. Specimens were collected. Cultures were negative. There were no signs of infection in the joint. She had uncontrollable pain after surgery. She had an ultrasound of the left knee there was no obvious left knee effusion, but she did have an area just medial to her hardware incision that was consistent with an abscess that tracked posteriorly. This was aspirated and sent for cultures. She is scheduled for repeat washout today.   Her symptoms are rated as moderate to severe, and have been worsening.  This is significantly impairing activities of daily living.    Please see clinic note for further details on this patient's care.    She has elected for surgical management.   Past Medical History:  Diagnosis Date  . Anxiety   . Arthritis   . Depression    Past Surgical History:  Procedure Laterality Date  . ABDOMINAL HYSTERECTOMY    . BREAST SURGERY     left lumpectomy   . gastric bypass surgery     . HARDWARE REMOVAL Left 08/28/2020   Procedure: HARDWARE REMOVAL;  Surgeon: Hiram Gash, MD;  Location: [facility];  Service: Orthopedics;  Laterality: Left;  . hemorrhjoid surgery     . KNEE ARTHROSCOPY WITH MEDIAL MENISECTOMY Left 08/28/2020   Procedure: KNEE ARTHROSCOPY WITH CHONDROPLASTY, MANIPULATION WITH LYSIS OF ADHESIONS, POSSIBLE MEDIAL MENISECTOMY;  Surgeon: Hiram Gash, MD;   Location: [facility];  Service: Orthopedics;  Laterality: Left;  . left femur fracture surgery      x 2  . ORIF TIBIA PLATEAU Left 02/26/2020   Procedure: OPEN REDUCTION INTERNAL FIXATION (ORIF) TIBIAL PLATEAU;  Surgeon: Shona Needles, MD;  Location: [facility];  Service: Orthopedics;  Laterality: Left;  . TUBAL LIGATION     Social History   Socioeconomic History  . Marital status: Married    Spouse name: Not on file  . Number of children: Not on file  . Years of education: Not on file  . Highest education level: Not on file  Occupational History  . Not on file  Tobacco Use  . Smoking status: Current Every Day Smoker    Types: Cigars  . Smokeless tobacco: Never Used  Vaping Use  . Vaping Use: Former  . Substances: Nicotine, Flavoring  Substance and Sexual Activity  . Alcohol use: Yes    Comment: 12 beers per week   . Drug use: Never  . Sexual activity: Not on file  Other Topics Concern  . Not on file  Social History Narrative  . Not on file   Social Determinants of Health   Financial Resource Strain: Not on file  Food Insecurity: Not on file  Transportation Needs: Not on file  Physical Activity: Not on file  Stress: Not on file  Social Connections: Not on file   History reviewed. No pertinent family history. No Known Allergies Prior to Admission medications   Medication Sig Start Date End Date  Taking? Authorizing Provider  acetaminophen (TYLENOL) 500 MG tablet Take 2 tablets (1,000 mg total) by mouth every 8 (eight) hours for 14 days. 08/28/20 09/11/20  Ethelda Chick, PA-C  aspirin (ASPIRIN CHILDRENS) 81 MG chewable tablet Chew 1 tablet (81 mg total) by mouth 2 (two) times daily. For DVT prophylaxis after surgery 08/28/20   Ethelda Chick, PA-C  celecoxib (CELEBREX) 200 MG capsule Take 1 capsule (200 mg total) by mouth 2 (two) times daily. 08/28/20 09/27/20  Brenya Taulbee, Maylene Roes, PA-C  gabapentin (NEURONTIN) 100 MG capsule Take 1 capsule (100 mg total) by mouth 3 (three)  times daily. For pain 08/28/20   Ethelda Chick, PA-C  liver oil-zinc oxide (DESITIN) 40 % ointment Apply 1 application topically daily as needed for irritation.    [provider]  methocarbamol (ROBAXIN) 500 MG tablet Take 1 tablet (500 mg total) by mouth every 8 (eight) hours as needed for muscle spasms. 08/28/20   Alexy Heldt, Maylene Roes, PA-C  omeprazole (PRILOSEC) 20 MG capsule Take 1 capsule (20 mg total) by mouth daily. To gastric protection while taking NSAIDs 08/28/20 09/27/20  Bran Aldridge, Maylene Roes, PA-C  sulfamethoxazole-trimethoprim (BACTRIM DS) 800-160 MG tablet Take 1 tablet by mouth 2 (two) times daily for 14 days. 08/28/20 09/11/20  Ethelda Chick, PA-C  VITAMIN D PO Take 1 tablet by mouth daily.    [provider]    ROS: All other systems have been reviewed and were otherwise negative with the exception of those mentioned in the HPI and as above.  Physical Exam: General: Alert, no acute distress Cardiovascular: No pedal edema Respiratory: No cyanosis, no use of accessory musculature GI: No organomegaly, abdomen is soft and non-tender Skin: No lesions in the area of chief complaint Neurologic: Sensation intact distally Psychiatric: Patient is competent for consent with normal mood and affect Lymphatic: No axillary or cervical lymphadenopathy  MUSCULOSKELETAL:  Left lower extremity: The patient has significant swelling from her knee down into her foot.  Redness and swelling consistent with cellulitis.  She also has some bruising about the posterior aspect of her left knee.  Incisions with nylon sutures.  There is some mild serosanguineous drainage at the distal aspect of her tibial incision.  Significant tenderness to palpation throughout the left knee, but worse from the medial aspect of the left lower leg.    Imaging: Ultrasound guided evaluation showing no obvious left knee effusion.  She had an area just medial to her distal incision that seemed consistent  with an abscess that tracked posteriorly  Assessment: ABCESS LT KNEE  Plan: Plan for Procedure(s): MINOR INCISION AND DRAINAGE OF ABSCESS  The risks benefits and alternatives were discussed with the patient including but not limited to the risks of nonoperative treatment, versus surgical intervention including infection, bleeding, nerve injury,  blood clots, cardiopulmonary complications, morbidity, mortality, among others, and they were willing to proceed.   We additionally specifically discussed risks of axillary nerve injury, infection, periprosthetic fracture, continued pain and longevity of implants prior to beginning procedure.    Patient will be admitted for inpatient treatment for surgery, pain control, OT, prophylactic IV antibiotics, VTE prophylaxis, and discharge planning.   The patient acknowledged the explanation, agreed to proceed with the plan and consent was signed.   Operative Plan: Left lower extremity irrigation and debridement of abscess Discharge Medications: Tylenol, Gabapentin, Oxycodone, Zofran, Robaxin, antibiotics  DVT Prophylaxis: Aspirin BID Physical Therapy: Outpatient  Special Discharge needs: +/-   Ethelda Chick, PA-C  09/11/2020 6:53 AM

## 2020-09-11 NOTE — Anesthesia Procedure Notes (Signed)
Date/Time: 09/11/2020 1:05 PM Performed by: Cynda Familia, CRNA Oxygen Delivery Method: Simple face mask Placement Confirmation: positive ETCO2 and breath sounds checked- equal and bilateral Dental Injury: Teeth and Oropharynx as per pre-operative assessment

## 2020-09-11 NOTE — Anesthesia Postprocedure Evaluation (Signed)
Anesthesia Post Note  Patient: Tyronza Happe  Procedure(s) Performed: Irrigation and debridement of deep abscess- left leg, septic joint (Left Knee)     Patient location during evaluation: PACU Anesthesia Type: General Level of consciousness: awake and alert, patient cooperative and oriented Pain management: pain level controlled Vital Signs Assessment: post-procedure vital signs reviewed and stable Respiratory status: spontaneous breathing, nonlabored ventilation and respiratory function stable Cardiovascular status: blood pressure returned to baseline and stable Postop Assessment: no apparent nausea or vomiting Anesthetic complications: no   No complications documented.  Last Vitals:  Vitals:   09/11/20 1445 09/11/20 1459  BP: (!) 165/72 (!) 161/75  Pulse:  84  Resp: (!) 23 20  Temp:  (!) 36.4 C  SpO2: 98% 92%    Last Pain:  Vitals:   09/11/20 1459  TempSrc: Oral  PainSc:                  Aubrie Lucien,E. Aalijah Lanphere

## 2020-09-11 NOTE — Anesthesia Procedure Notes (Signed)
Procedure Name: LMA Insertion Date/Time: 09/11/2020 12:03 PM Performed by: Cynda Familia, CRNA Pre-anesthesia Checklist: Patient identified, Emergency Drugs available, Suction available and Patient being monitored Patient Re-evaluated:Patient Re-evaluated prior to induction Oxygen Delivery Method: Circle System Utilized Preoxygenation: Pre-oxygenation with 100% oxygen Induction Type: IV induction Ventilation: Mask ventilation without difficulty LMA: LMA inserted and LMA with gastric port inserted LMA Size: 4.0 Tube type: Oral Number of attempts: 1 Placement Confirmation: positive ETCO2 Tube secured with: Tape Dental Injury: Teeth and Oropharynx as per pre-operative assessment  Comments: Smooth IV induction Glennon Mac- intubation AM CRNA atraumatic-- teeth and mouth as preop bilat BS

## 2020-09-11 NOTE — Progress Notes (Signed)
Pharmacy Antibiotic Note  [name] is a 65 y.o. female admitted on 09/11/2020 with knee abscess.  Pharmacy has been consulted for vancomycin dosing.  PMH significant for left tibial fracture requiring ORIF in November 2021. Returned to OR on 08/28/20 for hardware removal, debridement of abscess - cultures were negative. Pt underwent  I&D on 5/25, ID consulted. Vancomycin being started for wound infection, concern for infected joint.   Today, 09/11/20  WBC slightly elevated  Afebrile  Cefazolin 3 g dose given pre-op. Tobramycin + vancomycin powder given topically in OR.   Plan:  Vancomycin 1000 mg IV given post-op. Will give another 1000 mg IV dose for a total loading dose of 2000 mg  Vancomycin 1250 mg IV q12h  Goal vancomycin AUC 400-550.   Follow renal function, culture data. Check vancomycin levels at steady state as needed.  Height: 5\' 8"  (172.7 cm) Weight: (!) 149.7 kg (330 lb) IBW/kg (Calculated) : 63.9  Temp (24hrs), Avg:97.8 F (36.6 C), Min:97.5 F (36.4 C), Max:98.1 F (36.7 C)  Recent Labs  Lab 09/11/20 1552  WBC 11.5*  CREATININE 0.64    Estimated Creatinine Clearance: 110.1 mL/min (by C-G formula based on SCr of 0.64 mg/dL).    No Known Allergies  Antimicrobials this admission: vancomycin 5/25 >>  Cefazolin pre-op 5/25 x1  Dose adjustments this admission:  Microbiology results: 5/25 surgical/deep wound left knee:  Thank you for allowing pharmacy to be a part of this patient's care.  Lenis Noon, PharmD 09/11/2020 4:29 PM

## 2020-09-11 NOTE — Anesthesia Preprocedure Evaluation (Addendum)
Anesthesia Evaluation  Patient identified by MRN, date of birth, ID band Patient awake    Reviewed: Allergy & Precautions, NPO status , Patient's Chart, lab work & pertinent test results  History of Anesthesia Complications Negative for: history of anesthetic complications  Airway Mallampati: II  TM Distance: >3 FB Neck ROM: Full    Dental  (+) Poor Dentition, Chipped, Dental Advisory Given   Pulmonary Current SmokerPatient did not abstain from smoking.,  09/11/2020 SARS coronavirus NEG   breath sounds clear to auscultation       Cardiovascular hypertension, (-) angina Rhythm:Regular Rate:Normal     Neuro/Psych PSYCHIATRIC DISORDERS Anxiety Depression negative neurological ROS     GI/Hepatic Neg liver ROS, GERD  Controlled and Medicated,S/p gastric bypass   Endo/Other  Morbid obesity  Renal/GU negative Renal ROS     Musculoskeletal  (+) Arthritis ,   Abdominal (+) + obese,   Peds  Hematology negative hematology ROS (+)   Anesthesia Other Findings   Reproductive/Obstetrics                           Anesthesia Physical  Anesthesia Plan  ASA: III  Anesthesia Plan: General   Post-op Pain Management:    Induction: Intravenous  PONV Risk Score and Plan: 4 or greater and Ondansetron, Dexamethasone, Midazolam and Scopolamine patch - Pre-op  Airway Management Planned: LMA  Additional Equipment: None  Intra-op Plan:   Post-operative Plan: Extubation in OR  Informed Consent: I have reviewed the patients History and Physical, chart, labs and discussed the procedure including the risks, benefits and alternatives for the proposed anesthesia with the patient or authorized representative who has indicated his/her understanding and acceptance.     Dental advisory given  Plan Discussed with: Anesthesiologist, CRNA and Surgeon  Anesthesia Plan Comments: (    )       Anesthesia  Quick Evaluation

## 2020-09-12 ENCOUNTER — Inpatient Hospital Stay: Payer: Self-pay

## 2020-09-12 ENCOUNTER — Observation Stay (HOSPITAL_COMMUNITY): Payer: BC Managed Care – PPO

## 2020-09-12 ENCOUNTER — Ambulatory Visit: Payer: BC Managed Care – PPO | Admitting: Physical Therapy

## 2020-09-12 ENCOUNTER — Encounter (HOSPITAL_COMMUNITY): Payer: Self-pay | Admitting: Orthopaedic Surgery

## 2020-09-12 DIAGNOSIS — M79622 Pain in left upper arm: Secondary | ICD-10-CM | POA: Diagnosis not present

## 2020-09-12 DIAGNOSIS — F418 Other specified anxiety disorders: Secondary | ICD-10-CM | POA: Diagnosis not present

## 2020-09-12 DIAGNOSIS — F17290 Nicotine dependence, other tobacco product, uncomplicated: Secondary | ICD-10-CM | POA: Diagnosis present

## 2020-09-12 DIAGNOSIS — Z79899 Other long term (current) drug therapy: Secondary | ICD-10-CM | POA: Diagnosis not present

## 2020-09-12 DIAGNOSIS — L03119 Cellulitis of unspecified part of limb: Secondary | ICD-10-CM | POA: Diagnosis not present

## 2020-09-12 DIAGNOSIS — Z8781 Personal history of (healed) traumatic fracture: Secondary | ICD-10-CM | POA: Diagnosis not present

## 2020-09-12 DIAGNOSIS — M1250 Traumatic arthropathy, unspecified site: Secondary | ICD-10-CM | POA: Diagnosis present

## 2020-09-12 DIAGNOSIS — L02419 Cutaneous abscess of limb, unspecified: Secondary | ICD-10-CM | POA: Diagnosis not present

## 2020-09-12 DIAGNOSIS — M7592 Shoulder lesion, unspecified, left shoulder: Secondary | ICD-10-CM | POA: Diagnosis present

## 2020-09-12 DIAGNOSIS — Z90710 Acquired absence of both cervix and uterus: Secondary | ICD-10-CM | POA: Diagnosis not present

## 2020-09-12 DIAGNOSIS — Z9884 Bariatric surgery status: Secondary | ICD-10-CM | POA: Diagnosis not present

## 2020-09-12 DIAGNOSIS — R45851 Suicidal ideations: Secondary | ICD-10-CM | POA: Diagnosis present

## 2020-09-12 DIAGNOSIS — Z23 Encounter for immunization: Secondary | ICD-10-CM | POA: Diagnosis not present

## 2020-09-12 DIAGNOSIS — S83249A Other tear of medial meniscus, current injury, unspecified knee, initial encounter: Secondary | ICD-10-CM | POA: Diagnosis present

## 2020-09-12 DIAGNOSIS — F411 Generalized anxiety disorder: Secondary | ICD-10-CM | POA: Diagnosis present

## 2020-09-12 DIAGNOSIS — F419 Anxiety disorder, unspecified: Secondary | ICD-10-CM | POA: Diagnosis present

## 2020-09-12 DIAGNOSIS — M62838 Other muscle spasm: Secondary | ICD-10-CM | POA: Diagnosis present

## 2020-09-12 DIAGNOSIS — M009 Pyogenic arthritis, unspecified: Secondary | ICD-10-CM | POA: Diagnosis present

## 2020-09-12 DIAGNOSIS — F32A Depression, unspecified: Secondary | ICD-10-CM | POA: Diagnosis present

## 2020-09-12 DIAGNOSIS — G8929 Other chronic pain: Secondary | ICD-10-CM | POA: Diagnosis present

## 2020-09-12 DIAGNOSIS — Z6841 Body Mass Index (BMI) 40.0 and over, adult: Secondary | ICD-10-CM | POA: Diagnosis not present

## 2020-09-12 DIAGNOSIS — L02416 Cutaneous abscess of left lower limb: Secondary | ICD-10-CM | POA: Diagnosis present

## 2020-09-12 DIAGNOSIS — M779 Enthesopathy, unspecified: Secondary | ICD-10-CM | POA: Diagnosis present

## 2020-09-12 DIAGNOSIS — Z7982 Long term (current) use of aspirin: Secondary | ICD-10-CM | POA: Diagnosis not present

## 2020-09-12 DIAGNOSIS — Z20822 Contact with and (suspected) exposure to covid-19: Secondary | ICD-10-CM | POA: Diagnosis present

## 2020-09-12 DIAGNOSIS — I10 Essential (primary) hypertension: Secondary | ICD-10-CM | POA: Diagnosis present

## 2020-09-12 LAB — CBC
HCT: 36.6 % (ref 36.0–46.0)
Hemoglobin: 11.5 g/dL — ABNORMAL LOW (ref 12.0–15.0)
MCH: 29.7 pg (ref 26.0–34.0)
MCHC: 31.4 g/dL (ref 30.0–36.0)
MCV: 94.6 fL (ref 80.0–100.0)
Platelets: 278 10*3/uL (ref 150–400)
RBC: 3.87 MIL/uL (ref 3.87–5.11)
RDW: 16.4 % — ABNORMAL HIGH (ref 11.5–15.5)
WBC: 9.5 10*3/uL (ref 4.0–10.5)
nRBC: 0 % (ref 0.0–0.2)

## 2020-09-12 LAB — GLUCOSE, CAPILLARY
Glucose-Capillary: 117 mg/dL — ABNORMAL HIGH (ref 70–99)
Glucose-Capillary: 112 mg/dL — ABNORMAL HIGH (ref 70–99)
Glucose-Capillary: 130 mg/dL — ABNORMAL HIGH (ref 70–99)
Glucose-Capillary: 117 mg/dL — ABNORMAL HIGH (ref 70–99)

## 2020-09-12 LAB — BASIC METABOLIC PANEL
Anion gap: 9 (ref 5–15)
BUN: 9 mg/dL (ref 8–23)
CO2: 24 mmol/L (ref 22–32)
Calcium: 8.6 mg/dL — ABNORMAL LOW (ref 8.9–10.3)
Chloride: 100 mmol/L (ref 98–111)
Creatinine, Ser: 0.42 mg/dL — ABNORMAL LOW (ref 0.44–1.00)
GFR, Estimated: >60 mL/min (ref >60)
Glucose, Bld: 138 mg/dL — ABNORMAL HIGH (ref 70–99)
Potassium: 4.4 mmol/L (ref 3.5–5.1)
Sodium: 133 mmol/L — ABNORMAL LOW (ref 135–145)

## 2020-09-12 MED ORDER — GABAPENTIN 400 MG PO CAPS
800.0000 mg | ORAL_CAPSULE | Freq: Three times a day (TID) | ORAL | Status: DC
  Administered 2020-09-12 – 2020-09-20 (×24): 800 mg via ORAL
  Filled 2020-09-12 (×24): qty 2

## 2020-09-12 NOTE — NC FL2 (Signed)
[place] LEVEL OF CARE SCREENING TOOL     IDENTIFICATION  Patient Name: [name] Birthdate: 1955-07-17 Sex: female Admission Date (Current Location): 09/11/2020  [facility] and [place] Number:  Herbalist and Address:  [facility],  [address] [place], [place]      Provider Number: [id number]  Attending Physician Name and Address:  Hiram Gash, MD  Relative Name and Phone Number:  Cassara Nida (husband) Ph: [phone]    Current Level of Care: Hospital Recommended Level of Care: [place] Prior Approval Number:    Date Approved/Denied:   PASRR Number: [id number]  Discharge Plan: SNF    Current Diagnoses: Patient Active Problem List   Diagnosis Date Noted  . Cellulitis and abscess of lower extremity 09/11/2020  . Debility 05/02/2020  . Urinary incontinence 05/02/2020  . Pain in left shin   . Urinary frequency   . Sleep disturbance   . Pain   . Anxiety state   . Urinary urgency   . Drug induced constipation   . Post-operative pain   . Benign essential HTN   . Pruritus   . Left medial tibial plateau fracture 03/01/2020  . Morbid obesity ([place])   . Acute blood loss anemia   . Postoperative pain   . Tibial plateau fracture 02/25/2020  . Tibial plateau fracture, left 02/25/2020  . Hyponatremia 02/25/2020  . Alcohol use disorder, moderate, dependence ([place]) 02/25/2020  . Essential hypertension 02/25/2020    Orientation RESPIRATION BLADDER Height & Weight     Self,Time,Situation,Place  Normal Continent Weight: (!) 330 lb (149.7 kg) Height:  5\' 8"  (172.7 cm)  BEHAVIORAL SYMPTOMS/MOOD NEUROLOGICAL BOWEL NUTRITION STATUS      Continent Diet (Carb modified)  AMBULATORY STATUS COMMUNICATION OF NEEDS Skin   Limited Assist Verbally Surgical wounds                       Personal Care Assistance Level of Assistance  Bathing,Feeding,Dressing Bathing Assistance: Limited assistance Feeding  assistance: Independent Dressing Assistance: Limited assistance     Functional Limitations Info  Sight,Hearing,Speech Sight Info: Impaired Hearing Info: Adequate Speech Info: Adequate    SPECIAL CARE FACTORS FREQUENCY  PT (By licensed PT),OT (By licensed OT)     PT Frequency: 5x's/week OT Frequency: 5x's/week            Contractures Contractures Info: Not present    Additional Factors Info  Code Status,Allergies,Psychotropic Code Status Info: Full Allergies Info: NKA Psychotropic Info: Neurontin         Current Medications (09/12/2020):  This is the current hospital active medication list Current Facility-Administered Medications  Medication Dose Route Frequency Provider Last Rate Last Admin  . acetaminophen (TYLENOL) tablet 1,000 mg  1,000 mg Oral Q8H McBane, Caroline N, PA-C   1,000 mg at 09/12/20 2122  . bisacodyl (DULCOLAX) EC tablet 5 mg  5 mg Oral Daily PRN McBane, Maylene Roes, PA-C      . cefTRIAXone (ROCEPHIN) 2 g in sodium chloride 0.9 % 100 mL IVPB  2 g Intravenous Q24H Thayer Headings, MD   Stopped at 09/11/20 2040  . diphenhydrAMINE (BENADRYL) 12.5 MG/5ML elixir 12.5-25 mg  12.5-25 mg Oral Q4H PRN McBane, Maylene Roes, PA-C      . docusate sodium (COLACE) capsule 100 mg  100 mg Oral BID Ethelda Chick, PA-C   100 mg at 09/11/20 2105  . enoxaparin (LOVENOX) injection 40 mg  40 mg Subcutaneous Q24H  Ethelda Chick, PA-C   40 mg at 09/12/20 7846  . gabapentin (NEURONTIN) capsule 800 mg  800 mg Oral TID Ethelda Chick, PA-C   800 mg at 09/12/20 0818  . HYDROmorphone (DILAUDID) injection 0.5-1 mg  0.5-1 mg Intravenous Q4H PRN McBane, Maylene Roes, PA-C      . ketorolac (TORADOL) 15 MG/ML injection 15 mg  15 mg Intravenous Q6H McBane, Caroline N, PA-C   15 mg at 09/12/20 9629  . lactated ringers infusion   Intravenous Continuous Ethelda Chick, PA-C 50 mL/hr at 09/11/20 1539 New Bag at 09/11/20 1539  . magnesium citrate solution 1 Bottle  1 Bottle Oral Once  PRN McBane, Maylene Roes, PA-C      . methocarbamol (ROBAXIN) tablet 500 mg  500 mg Oral Q6H PRN Ethelda Chick, PA-C   500 mg at 09/12/20 0818   Or  . methocarbamol (ROBAXIN) 500 mg in dextrose 5 % 50 mL IVPB  500 mg Intravenous Q6H PRN McBane, Maylene Roes, PA-C      . metoCLOPramide (REGLAN) tablet 5-10 mg  5-10 mg Oral Q8H PRN McBane, Maylene Roes, PA-C       Or  . metoCLOPramide (REGLAN) injection 5-10 mg  5-10 mg Intravenous Q8H PRN McBane, Caroline N, PA-C      . ondansetron (ZOFRAN) tablet 4 mg  4 mg Oral Q6H PRN McBane, Caroline N, PA-C       Or  . ondansetron (ZOFRAN) injection 4 mg  4 mg Intravenous Q6H PRN McBane, Caroline N, PA-C      . oxyCODONE (Oxy IR/ROXICODONE) immediate release tablet 10-15 mg  10-15 mg Oral Q4H PRN Ethelda Chick, PA-C   10 mg at 09/12/20 0531  . oxyCODONE (Oxy IR/ROXICODONE) immediate release tablet 5-10 mg  5-10 mg Oral Q4H PRN Ethelda Chick, PA-C   10 mg at 09/12/20 1009  . pantoprazole (PROTONIX) EC tablet 40 mg  40 mg Oral Daily Ethelda Chick, PA-C   40 mg at 09/12/20 0818  . polyethylene glycol (MIRALAX / GLYCOLAX) packet 17 g  17 g Oral Daily PRN McBane, Maylene Roes, PA-C      . vancomycin (VANCOREADY) IVPB 1250 mg/250 mL  1,250 mg Intravenous Q12H Lenis Noon, RPH 166.7 mL/hr at 09/12/20 0617 1,250 mg at 09/12/20 0617  . zolpidem (AMBIEN) tablet 5 mg  5 mg Oral QHS PRN Ethelda Chick, PA-C   5 mg at 09/11/20 2316     Discharge Medications: Please see discharge summary for a list of discharge medications.  Relevant Imaging Results:  Relevant Lab Results:   Additional Information SSN: [id number]  Sherie Don, LCSW

## 2020-09-12 NOTE — Progress Notes (Signed)
   ORTHOPAEDIC PROGRESS NOTE  s/p Procedure(s): Irrigation and debridement of deep abscess- left leg, septic joint on 09/11/20 with Dr. Griffin Basil  SUBJECTIVE: Reports pain level is about a 2/10 when she is just sitting in bed. With any attempt of movement pain is unbearable. She also notes she is having some pain in her left shoulder which started last week. Worsened over last few days. She did have a fall where she hit her left arm. Her husband has been unable to care for her at home. She is having limited ability to get around due to the significant pain. No chest pain. No SOB. No nausea/vomiting. No other complaints.  OBJECTIVE: PE: General: NAD, sitting up in hospital bed Cardiac: regular rate Pulmonary: no increased work of breathing GI: abdomen soft, non-tender Left lower extremity: dressing CDI, prevena in place with good seal, no drainage noted in canister, hemovacs (knee joint and lower leg) in place, about 50 cc bloody drainage in canister, intact EHL/TA/GSC, endorses distal sensation, warm well perfused foot Left upper extremity: pain with forward elevation, active forward elevation to 140 degrees, tender to palpation AC joint, TTP biceps musculature, bruising noted left forearm, distal motor and sensory function intact, warm well perfused hand   Vitals:   09/12/20 0118 09/12/20 0513  BP: 130/63 (!) 158/74  Pulse: (!) 59 (!) 56  Resp: 16 16  Temp: 98.5 F (36.9 C) 98.4 F (36.9 C)  SpO2: 97% 97%     ASSESSMENT: [name] is a 65 y.o. female  - s/p: Left knee incision and debridement of deep abscess, incision and debridement of septic knee joint  - POD #1 - Left shoulder pain  PLAN: Weightbearing: WBAT LLE Insicional and dressing care: Reinforce dressings as needed Orthopedic device(s): Prevena incisional vac, hemovacs Showering: Hold for now VTE prophylaxis: Lovenox  Pain control: PRN pain medications, preferring oral medications Left superficial abscess and  possible septic joint: ID has consulted. Recommend Vancomycin and Ceftriaxone. Continue to monitor cultures  Left shoulder pain: Will get x-rays of left shoulder for further evaluation.  Follow - up plan: TBD. PT/OT evaluations pending. TOC consult placed. Her husband is unable to help take care of her at home. She may require short term rehab.   Contact information:  Dr. Ophelia Charter, Noemi Chapel PA-C, After hours and holidays please check [email or web address] for group call information for [facility]  Noemi Chapel, PA-C 09/12/2020

## 2020-09-12 NOTE — Progress Notes (Signed)
Pt refused PICC insertion as per RN.Will follow up tomorrow 5/27.

## 2020-09-12 NOTE — Evaluation (Signed)
Occupational Therapy Evaluation Patient Details Name: [name] MRN: [id number] DOB: [birth year] Today's Date: 09/12/2020    History of Present Illness Patient had a left knee surgery with proximal tibial hardware removal, tibial incision debridement of deep abscess., left knee scope with medial femoral chondroplasty, lysis of adhesions and manipulation under anesthesia on 08/28/2020. There was concern about infection of the hardware as there was an area of purulent material. She is now s/p Left knee incision and debridement of deep abscess,  Left knee septic joint incision and debridement on 5/25 with hemovac and wound vac.   Clinical Impression   [name] is a 65 year old woman s/p I & D of left septic knee with wound vac, hemovac, pain, decreased ROM and strength of left knee resulting in decreased independence with ADLs and mobility. Patient reports limited mobility at home due to pain - walking approx 20 feet to bathroom and performing tasks from seated position and using DME and AE. Patient wants to improve independence, strength and endurance in order to return to PLOF. Reports a recent fall at home and limited physical assistance from husband. Patient only able to tolerate transfer to [facility] today due to pain, needs min assist for sit to stand and min guard for safety to pivot. Patient needs assistance for pericare and LB ADLs and seated position for all other ADLs. Patient min assist for LLE with bed mobility and heavy reliance on bed rails. Patient will benefit from skilled OT services while in hospital to improve deficits and learn compensatory strategies as needed in order to return to PLOF before knee infection.      Follow Up Recommendations  SNF    Equipment Recommendations  None recommended by OT    Recommendations for Other Services       Precautions / Restrictions Precautions Precautions: Fall Precaution Comments: Recent fall at home per ortho note; wound vac and  hemovac Restrictions Weight Bearing Restrictions: No LLE Weight Bearing: Weight bearing as tolerated      Mobility Bed Mobility Overal bed mobility: Needs Assistance Bed Mobility: Supine to Sit;Sit to Supine     Supine to sit: Min assist;HOB elevated Sit to supine: Min assist        Transfers Overall transfer level: Needs assistance Equipment used: Rolling walker (2 wheeled) Transfers: Sit to/from [facility] Sit to Stand: Min assist Stand pivot transfers: Min guard            Balance Overall balance assessment: Needs assistance Sitting-balance support: No upper extremity supported Sitting balance-Leahy Scale: Fair     Standing balance support: During functional activity;Single extremity supported Standing balance-Leahy Scale: Fair                             ADL either performed or assessed with clinical judgement   ADL Overall ADL's : Needs assistance/impaired Eating/Feeding: Independent   Grooming: Independent;Sitting   Upper Body Bathing: Independent;Sitting   Lower Body Bathing: Minimal assistance;Sit to/from stand   Upper Body Dressing : Independent;Sitting   Lower Body Dressing: Minimal assistance;Sit to/from stand Lower Body Dressing Details (indicate cue type and reason): assistance to don Depends over feet Toilet Transfer: BSC;RW;Minimal assistance Toilet Transfer Details (indicate cue type and reason): min assist to power up [place] and Hygiene: Sit to/from stand;Moderate assistance Toileting - Clothing Manipulation Details (indicate cue type and reason): min assist for clothing management, able to clean periarea with wiping with leaning -  needs asssitance for perianal area     Functional mobility during ADLs: Minimal assistance;Rolling walker General ADL Comments: Only able to tolerate transfer to [facility] and back to bed this morning due to pain wtih movement and weight bearing     Vision  Patient Visual Report: No change from baseline       Perception     Praxis      Pertinent Vitals/Pain Pain Assessment: 0-10 Pain Score: 5  Pain Descriptors / Indicators: Aching;Grimacing;Guarding;Cramping;Sharp;Stabbing Pain Intervention(s): Limited activity within patient's tolerance;Repositioned     Hand Dominance Right   Extremity/Trunk Assessment Upper Extremity Assessment Upper Extremity Assessment: Overall WFL for tasks assessed   Lower Extremity Assessment Lower Extremity Assessment: Defer to PT evaluation   Cervical / Trunk Assessment Cervical / Trunk Assessment: Normal   Communication Communication Communication: No difficulties   Cognition Arousal/Alertness: Awake/alert Behavior During Therapy: WFL for tasks assessed/performed Overall Cognitive Status: Within Functional Limits for tasks assessed                                     General Comments       Exercises     Shoulder Instructions      Home Living Family/patient expects to be discharged to:: Private residence Living Arrangements: Spouse/significant other Available Help at Discharge: Family;Available 24 hours/day Type of Home: House Home Access: Stairs to enter (has a temporary ramp) Entrance Stairs-Number of Steps: 2   Home Layout: One level     Bathroom Shower/Tub: Teacher, early years/pre: Standard Bathroom Accessibility: Yes How Accessible: Accessible via walker Home Equipment: Walker - 2 wheels;Hand held shower head;Adaptive equipment;Cane - quad;Wheelchair - manual;Other (comment) (shower chair with slide seat) Adaptive Equipment: Reacher;Long-handled shoe horn;Sock aid Additional Comments: Mod I with AE for dressing and toileting. Has predominantly been performing sponge baths. Uses bidet for pericare      Prior Functioning/Environment Level of Independence: Independent with assistive device(s)        Comments: Has been ambulating to bathroom with RW -  approx 20 feet. Has been limited by pain. Reports her husband can't physically assist her. Sleeping in the recliner at home.        OT Problem List: Decreased strength;Decreased range of motion;Decreased activity tolerance;Impaired balance (sitting and/or standing);Pain;Obesity      OT Treatment/Interventions: Self-care/ADL training;Therapeutic exercise;DME and/or AE instruction;Therapeutic activities;Balance training;Patient/family education    OT Goals(Current goals can be found in the care plan section) Acute Rehab OT Goals Patient Stated Goal: To walk without pain OT Goal Formulation: With patient Time For Goal Achievement: 09/26/20 Potential to Achieve Goals: Good  OT Frequency: Min 2X/week   Barriers to D/C:            Co-evaluation              AM-PAC OT "6 Clicks" Daily Activity     Outcome Measure Help from another person eating meals?: None Help from another person taking care of personal grooming?: None Help from another person toileting, which includes using toliet, bedpan, or urinal?: A Lot Help from another person bathing (including washing, rinsing, drying)?: A Little Help from another person to put on and taking off regular upper body clothing?: A Little Help from another person to put on and taking off regular lower body clothing?: A Lot 6 Click Score: 18   End of Session Equipment Utilized During Treatment: Surveyor, mining Communication:  (  okay to see per Rn)  Activity Tolerance: Patient limited by pain Patient left: in bed;with call bell/phone within reach  OT Visit Diagnosis: Unsteadiness on feet (R26.81);Other abnormalities of gait and mobility (R26.89);Pain Pain - Right/Left: Left Pain - part of body: Knee                Time: 0375-4360 OT Time Calculation (min): 30 min Charges:  OT General Charges $OT Visit: 1 Visit OT Evaluation $OT Eval Low Complexity: 1 Low OT Treatments $Self Care/Home Management : 8-22 mins  Jlen Wintle,  OTR/L [place] [phone] Pager: [place] 09/12/2020, 10:31 AM

## 2020-09-12 NOTE — Consult Note (Addendum)
[facility] for Infectious Disease   Reason for visit: Follow up on possible septic arthritis, wound infection  Interval History: she reports having the same level of pain, no improvement from prior to the surgery.  She is tearful with interview, expresses frustration.  WBC wnl, no fever.   Day 2 vancomycin and ceftriaxone  Physical Exam: Constitutional:  Vitals:   09/12/20 0513 09/12/20 1530  BP: (!) 158/74 (!) 150/75  Pulse: (!) 56 (!) 56  Resp: 16 17  Temp: 98.4 F (36.9 C) 97.8 F (36.6 C)  SpO2: 97% 99%   patient appears in NAD Respiratory: Normal respiratory effort; CTA B Cardiovascular: RRR MS: left leg wrapped with cool wrap Skin: no rashes  Review of Systems: Constitutional: negative for fevers and chills Gastrointestinal: negative for nausea and diarrhea Integument/breast: negative for rash  Lab Results  Component Value Date   WBC 9.5 09/12/2020   HGB 11.5 (L) 09/12/2020   HCT 36.6 09/12/2020   MCV 94.6 09/12/2020   PLT 278 09/12/2020    Lab Results  Component Value Date   CREATININE 0.42 (L) 09/12/2020   BUN 9 09/12/2020   NA 133 (L) 09/12/2020   K 4.4 09/12/2020   CL 100 09/12/2020   CO2 24 09/12/2020    Lab Results  Component Value Date   ALT 20 03/04/2020   AST 15 03/04/2020   ALKPHOS 68 03/04/2020     Microbiology: Recent Results (from the past 240 hour(s))  SARS Coronavirus 2 by RT PCR (hospital order, performed in [place] hospital lab) Nasopharyngeal Nasopharyngeal Swab     Status: None   Collection Time: 09/11/20  8:24 AM   Specimen: Nasopharyngeal Swab  Result Value Ref Range Status   SARS Coronavirus 2 NEGATIVE NEGATIVE Final    Comment: Performed at [facility], [place] [address], [place], [place] [address]  Aerobic/Anaerobic Culture w Gram Stain (surgical/deep wound)     Status: None (Preliminary result)   Collection Time: 09/11/20 12:26 PM   Specimen: Soft Tissue, Other  Result Value Ref Range Status    Specimen Description   Final    KNEE LEFT Performed at [facility] [address], [place], [place] [address]    Special Requests NO 1  Final   Gram Stain   Final    FEW WBC PRESENT,BOTH PMN AND MONONUCLEAR NO ORGANISMS SEEN    Culture   Final    NO GROWTH < 24 HOURS Performed at [facility], [place] [address], [place], [place] [address]    Report Status PENDING  Incomplete  Aerobic/Anaerobic Culture w Gram Stain (surgical/deep wound)     Status: None (Preliminary result)   Collection Time: 09/11/20 12:28 PM   Specimen: Soft Tissue, Other  Result Value Ref Range Status   Specimen Description   Final    KNEE LEFT Performed at [facility] [address] [place], [place] [address]    Special Requests NO 2  Final   Gram Stain   Final    RARE WBC PRESENT, PREDOMINANTLY PMN NO ORGANISMS SEEN Performed at [facility], [place] [address], [place],  [address]    Culture PENDING  Incomplete   Report Status PENDING  Incomplete    Impression/Plan:  1. Abscess - previous abscess noted and no culture growth.  She had been on antibiotics at home with no improvement in her pain and now washed out again.  No culture growth from  the OR to date.  All hardware removed.  Will continue with antibiotics.   2. Possible septic arthritis - there is some concern for septic arthritis and joint was washed out due to that concern and the patient is having a lot of ongoing pain she centers to the joint itself. I discussed options with her and have offered treatment for possible septic arthritis and will do that with vancomycin and ceftriaxone for 3 weeks.    3.  Access - will order a picc line and plan for above, 3 weeks antibiotics.  She is going to a SNF.    I will arrange follow up with me in about 2 weeks I will continue to monitor the cultures

## 2020-09-12 NOTE — TOC Initial Note (Signed)
Transition of Care [facility]) - Initial/Assessment Note   Patient Details  Name: [name] MRN: [id number] Date of Birth: 1956-01-04  Transition of Care [facility]) CM/SW Contact:    Sherie Don, LCSW Phone Number: 09/12/2020, 12:54 PM  Clinical Narrative: Patient is a 65 year old female who is under observation for cellulitis and abscess of lower extremity. PT and OT evaluations recommended SNF and patient is agreeable to being referred out. Patient has been vaccinated for COVID. FL2 completed; PASRR verified. Initial referral faxed out. TOC awaiting bed offers; facility to start insurance authorization.                Expected Discharge Plan: Skilled Nursing Facility Barriers to Discharge: Continued Medical Work up,SNF Pending bed offer  Patient Goals and CMS Choice Patient states their goals for this hospitalization and ongoing recovery are:: Go to short-term rehab CMS Medicare.gov Compare Post Acute Care list provided to:: Patient Choice offered to / list presented to : Patient  Expected Discharge Plan and Services Expected Discharge Plan: [place] In-house Referral: Clinical Social Work Post Acute Care Choice: [place] Living arrangements for the past 2 months: [place]              DME Arranged: N/A DME Agency: NA  Prior Living Arrangements/Services Living arrangements for the past 2 months: Single Family Home Lives with:: Spouse Patient language and need for interpreter reviewed:: Yes Do you feel safe going back to the place where you live?: Yes      Need for Family Participation in Patient Care: No (Comment) Care giver support system in place?: Yes (comment) Current home services: DME (RW, WC, cane) Criminal Activity/Legal Involvement Pertinent to Current Situation/Hospitalization: No - Comment as needed  Activities of Daily Living Home Assistive Devices/Equipment: Blood pressure cuff,Eyeglasses,Walker (specify type),Wheelchair,Cane  (specify quad or straight),Built-in shower seat,Hand-held shower hose ADL Screening (condition at time of admission) Patient's cognitive ability adequate to safely complete daily activities?: Yes Is the patient deaf or have difficulty hearing?: No Does the patient have difficulty seeing, even when wearing glasses/contacts?: No Does the patient have difficulty concentrating, remembering, or making decisions?: No Patient able to express need for assistance with ADLs?: Yes Does the patient have difficulty dressing or bathing?: No Independently performs ADLs?: Yes (appropriate for developmental age) Does the patient have difficulty walking or climbing stairs?: Yes Weakness of Legs: Left Weakness of Arms/Hands: None  Permission Sought/Granted Permission sought to share information with : Facility Art therapist granted to share information with : Yes, Verbal Permission Granted Permission granted to share info w AGENCY: SNFs  Emotional Assessment Appearance:: Appears stated age Attitude/Demeanor/Rapport: Engaged Affect (typically observed): Accepting Orientation: : Oriented to Self,Oriented to Place,Oriented to  Time,Oriented to Situation Psych Involvement: No (comment)  Admission diagnosis:  Cellulitis and abscess of lower extremity [L03.119, L02.419] Patient Active Problem List   Diagnosis Date Noted  . Cellulitis and abscess of lower extremity 09/11/2020  . Debility 05/02/2020  . Urinary incontinence 05/02/2020  . Pain in left shin   . Urinary frequency   . Sleep disturbance   . Pain   . Anxiety state   . Urinary urgency   . Drug induced constipation   . Post-operative pain   . Benign essential HTN   . Pruritus   . Left medial tibial plateau fracture 03/01/2020  . Morbid obesity ([place])   . Acute blood loss anemia   . Postoperative pain   . Tibial plateau fracture 02/25/2020  .  Tibial plateau fracture, left 02/25/2020  . Hyponatremia 02/25/2020  . Alcohol  use disorder, moderate, dependence ([place]) 02/25/2020  . Essential hypertension 02/25/2020   PCP:  [name], MD Pharmacy:   [facility] [phone] [address], [address] AT [facility] [address] [place] [address] Phone: [phone] Fax: [phone]  Readmission Risk Interventions Readmission Risk Prevention Plan 02/27/2020  Post Dischage Appt Complete  Medication Screening Complete  Transportation Screening Complete  Some recent data might be hidden

## 2020-09-12 NOTE — Evaluation (Signed)
Physical Therapy Evaluation Patient Details Name: [name] MRN: [id number] DOB: November 08, 1955 Today's Date: 09/12/2020   History of Present Illness  Patient had a left knee surgery with proximal tibial hardware removal, tibial incision debridement of deep abscess., left knee scope with medial femoral chondroplasty, lysis of adhesions and manipulation under anesthesia on 08/28/2020. There was concern about infection of the hardware as there was an area of purulent material. She is now s/p Left knee incision and debridement of deep abscess on 09/11/20,  Left knee septic joint incision and debridement on 5/25 with hemovac and wound vac.  Other PMH includes depression, arthritis, anxiety.  Clinical Impression   Pt admitted with above diagnosis. She was able to ambulate 15' x 2 with min A limited due to pain.  Pt with very limited knee flexion due to pain.  Also, requiring min A for transfers.  Pt is from home with spouse (who is unable to provide physical assist) and has steps to enter home.  She additionally has had recent fall.  Recommend SNF at d/c to progress mobility for safe transfer home.  Pt currently with functional limitations due to the deficits listed below (see PT Problem List). Pt will benefit from acute skilled PT to increase their independence and safety with mobility to allow discharge to the venue listed below.       Follow Up Recommendations SNF    Equipment Recommendations  None recommended by PT    Recommendations for Other Services       Precautions / Restrictions Precautions Precautions: Fall Precaution Comments: Recent fall at home per ortho note; wound vac and hemovac Restrictions Weight Bearing Restrictions: No LLE Weight Bearing: Weight bearing as tolerated      Mobility  Bed Mobility Overal bed mobility: Needs Assistance Bed Mobility: Supine to Sit     Supine to sit: Min assist;HOB elevated Sit to supine: Min assist   General bed mobility comments: min A  for L LE    Transfers Overall transfer level: Needs assistance Equipment used: Rolling walker (2 wheeled) Transfers: Sit to/from Stand Sit to Stand: Min assist Stand pivot transfers: Min guard       General transfer comment: Min A to rise with bed elevated and cues for L LE management  Ambulation/Gait Ambulation/Gait assistance: Min assist Gait Distance (Feet): 15 Feet (15'x2) Assistive device: Rolling walker (2 wheeled) Gait Pattern/deviations: Step-to pattern;Decreased stance time - left;Decreased weight shift to left Gait velocity: decreased   General Gait Details: min A to steady; limited by pain requiring seated rest breaks  Stairs            Wheelchair Mobility    Modified Rankin (Stroke Patients Only)       Balance Overall balance assessment: Needs assistance;History of Falls Sitting-balance support: No upper extremity supported Sitting balance-Leahy Scale: Good     Standing balance support: No upper extremity supported Standing balance-Leahy Scale: Fair Standing balance comment: RW for ambulation but able to stand and wash hands at sink statically                             Pertinent Vitals/Pain Pain Assessment: 0-10 Pain Score: 8  Pain Location: L knee Pain Descriptors / Indicators: Aching;Grimacing;Guarding;Cramping;Sharp;Stabbing;Burning;Discomfort Pain Intervention(s): Limited activity within patient's tolerance;Monitored during session;Repositioned;Ice applied;Premedicated before session    [place] expects to be discharged to:: Private residence Living Arrangements: Spouse/significant other Available Help at Discharge: Family;Available 24 hours/day Type of Home:  House Home Access: Stairs to enter (has a temporary ramp)   Entrance Stairs-Number of Steps: 2 Home Layout: One level Home Equipment: Walker - 2 wheels;Hand held shower head;Adaptive equipment;Cane - quad;Wheelchair - manual;Other (comment) (shower  chair with slide seat) Additional Comments: Mod I with AE for dressing and toileting. Has predominantly been performing sponge baths. Uses bidet for pericare    Prior Function Level of Independence: Independent with assistive device(s)         Comments: Has been ambulating to bathroom with RW - approx 20 feet. Has been limited by pain. Reports her husband can't physically assist her. Sleeping in the recliner at home. Also, reports having to go to outpt PT b/c of dogs at home and can't do HH     Hand Dominance   Dominant Hand: Right    Extremity/Trunk Assessment   Upper Extremity Assessment Upper Extremity Assessment: Overall WFL for tasks assessed    Lower Extremity Assessment Lower Extremity Assessment: LLE deficits/detail;RLE deficits/detail RLE Deficits / Details: WFL LLE Deficits / Details: ROM: L hip and ankle WFL, knee 0 to 20 degrees limited by pain; MMT: ankle 5/5, hip 3/5, knee ext 2/5    Cervical / Trunk Assessment Cervical / Trunk Assessment: Normal  Communication   Communication: No difficulties  Cognition Arousal/Alertness: Awake/alert Behavior During Therapy: WFL for tasks assessed/performed Overall Cognitive Status: Within Functional Limits for tasks assessed                                        General Comments      Exercises General Exercises - Lower Extremity Ankle Circles/Pumps: AROM;Both;10 reps;Supine Quad Sets: AROM;Both;5 reps;Supine Short Arc Quad: AROM;Left;5 reps;Seated   Assessment/Plan    PT Assessment Patient needs continued PT services  PT Problem List Decreased strength;Decreased mobility;Decreased range of motion;Decreased activity tolerance;Decreased balance;Decreased knowledge of use of DME;Pain       PT Treatment Interventions DME instruction;Therapeutic activities;Modalities;Gait training;Therapeutic exercise;Patient/family education;Stair training;Balance training;Functional mobility training;Manual  techniques    PT Goals (Current goals can be found in the Care Plan section)  Acute Rehab PT Goals Patient Stated Goal: To walk without pain PT Goal Formulation: With patient Time For Goal Achievement: 09/26/20 Potential to Achieve Goals: Good    Frequency Min 5X/week   Barriers to discharge Decreased caregiver support      Co-evaluation               AM-PAC PT "6 Clicks" Mobility  Outcome Measure Help needed turning from your back to your side while in a flat bed without using bedrails?: A Little Help needed moving from lying on your back to sitting on the side of a flat bed without using bedrails?: A Little Help needed moving to and from a bed to a chair (including a wheelchair)?: A Little Help needed standing up from a chair using your arms (e.g., wheelchair or bedside chair)?: A Little Help needed to walk in hospital room?: A Little Help needed climbing 3-5 steps with a railing? : A Lot 6 Click Score: 17    End of Session Equipment Utilized During Treatment: Gait belt Activity Tolerance: Patient limited by pain Patient left: in chair;with call bell/phone within reach Nurse Communication: Mobility status PT Visit Diagnosis: Unsteadiness on feet (R26.81);Pain;Muscle weakness (generalized) (M62.81) Pain - Right/Left: Left Pain - part of body: Knee    Time: 6301-6010 PT Time Calculation (min) (ACUTE ONLY):  28 min   Charges:   PT Evaluation $PT Eval Moderate Complexity: 1 Mod PT Treatments $Gait Training: 8-22 mins        Abran Richard, PT Acute Rehab Services Pager [phone] [facility] [place] 09/12/2020, 12:37 PM

## 2020-09-13 ENCOUNTER — Inpatient Hospital Stay (HOSPITAL_COMMUNITY): Payer: BC Managed Care – PPO

## 2020-09-13 LAB — CREATININE, SERUM
Creatinine, Ser: 0.47 mg/dL (ref 0.44–1.00)
GFR, Estimated: >60 mL/min (ref >60)

## 2020-09-13 LAB — BASIC METABOLIC PANEL
Anion gap: 5 (ref 5–15)
BUN: 12 mg/dL (ref 8–23)
CO2: 28 mmol/L (ref 22–32)
Calcium: 9.2 mg/dL (ref 8.9–10.3)
Chloride: 105 mmol/L (ref 98–111)
Creatinine, Ser: 0.61 mg/dL (ref 0.44–1.00)
GFR, Estimated: >60 mL/min (ref >60)
Glucose, Bld: 104 mg/dL — ABNORMAL HIGH (ref 70–99)
Potassium: 4.3 mmol/L (ref 3.5–5.1)
Sodium: 138 mmol/L (ref 135–145)

## 2020-09-13 LAB — CBC
HCT: 35.4 % — ABNORMAL LOW (ref 36.0–46.0)
Hemoglobin: 11 g/dL — ABNORMAL LOW (ref 12.0–15.0)
MCH: 29.7 pg (ref 26.0–34.0)
MCHC: 31.1 g/dL (ref 30.0–36.0)
MCV: 95.7 fL (ref 80.0–100.0)
Platelets: 238 10*3/uL (ref 150–400)
RBC: 3.7 MIL/uL — ABNORMAL LOW (ref 3.87–5.11)
RDW: 16.6 % — ABNORMAL HIGH (ref 11.5–15.5)
WBC: 8.2 10*3/uL (ref 4.0–10.5)
nRBC: 0 % (ref 0.0–0.2)

## 2020-09-13 LAB — HEMOGLOBIN A1C
Hgb A1c MFr Bld: 5.7 % — ABNORMAL HIGH (ref 4.8–5.6)
Mean Plasma Glucose: 117 mg/dL

## 2020-09-13 LAB — VANCOMYCIN, PEAK: Vancomycin Pk: 21 ug/mL — ABNORMAL LOW (ref 30–40)

## 2020-09-13 MED ORDER — COVID-19 MRNA VACC (MODERNA) 50 MCG/0.25ML IM SUSP
0.2500 mL | Freq: Once | INTRAMUSCULAR | Status: AC
  Administered 2020-09-13: 0.25 mL via INTRAMUSCULAR
  Filled 2020-09-13: qty 0.25

## 2020-09-13 MED ORDER — LIDOCAINE 5 % EX PTCH
1.0000 | MEDICATED_PATCH | CUTANEOUS | Status: DC
  Administered 2020-09-13 – 2020-09-19 (×6): 1 via TRANSDERMAL
  Filled 2020-09-13 (×8): qty 1

## 2020-09-13 NOTE — Progress Notes (Signed)
    [facility] for Infectious Disease   Reason for visit: Follow up on septic arthritis  Interval History: no growth on the culture  Physical Exam: Constitutional:  Vitals:   09/13/20 0632 09/13/20 1403  BP: (!) 159/76 (!) 187/77  Pulse: (!) 55 64  Resp: 17 18  Temp: 98.3 F (36.8 C) 97.7 F (36.5 C)  SpO2: 97% 97%    Impression: abscess and possible septic arthritis.    Plan: 1.  No changes, plan for 3 weeks.  I will continue to monitor the culture after discharge and make changes as indicated.   Follow up arranged with me.

## 2020-09-13 NOTE — Progress Notes (Signed)
   ORTHOPAEDIC PROGRESS NOTE  s/p Procedure(s): Irrigation and debridement of deep abscess- left leg, septic joint on 09/11/20 with Dr. Griffin Basil  SUBJECTIVE: Patient still have uncontrollable pain. She feels no better after the washout. She is very frustrated by this. Her left shoulder is bothering her as well. She is unable to tolerate physical therapy due to the pain. Having muscle spasms and sharp shooting pains.   No chest pain. No SOB. No nausea/vomiting. No other complaints.  OBJECTIVE: PE: General: NAD, sitting up in hospital bed Cardiac: regular rate Pulmonary: no increased work of breathing GI: abdomen soft, non-tender Left lower extremity: cellulitis of LLE improved significantly. Foot and ankle no longer red, swollen, tender. prevena in place with good seal, no drainage noted in canister, hemovacs (knee joint and lower leg) in place, less than 50 cc bloody drainage in canister, intact EHL/TA/GSC, endorses distal sensation, warm well perfused foot Left upper extremity: pain with forward elevation, active forward elevation to 160 degrees - reports pain with ROM, external rotation to 60 degrees, cuff strength 5/5, pain with all cuff testing, tender to palpation AC joint, TTP left upper back and periscapular muscles, bruising noted left forearm, distal motor and sensory function intact, warm well perfused hand   Vitals:   09/12/20 2253 09/13/20 0632  BP: (!) 156/74 (!) 159/76  Pulse: (!) 58 (!) 55  Resp:  17  Temp:  98.3 F (36.8 C)  SpO2:  97%     ASSESSMENT: [name] is a 65 y.o. female  - s/p: Left knee incision and debridement of deep abscess, incision and debridement of septic knee joint  - POD #2 - Left shoulder pain  PLAN: Weightbearing: WBAT LLE Insicional and dressing care: Reinforce dressings as needed Dressing removed today. Plan to remove vacs tomorrow.  Orthopedic device(s): Prevena incisional vac, hemovacs Showering: Hold for now. Okay for sponge bath.   VTE prophylaxis: Lovenox  Pain control: PRN pain medications, preferring oral medications. IceMan.  Left superficial abscess and possible septic joint: ID has consulted. Recommend Vancomycin and Ceftriaxone. Continue to monitor cultures - currently showing no growth. ID recommending PICC line. Patient still having uncontrollable pain. Will get MRI of the knee. It is atypical that she would not have any relief after washout and IV antibiotics.  Left shoulder pain: X-rays show AC joint arthritis. Continues to have pain with ROM. Will order lidocaine patch for muscle tension left upper back. Will also get MRI for further evaluation Elevated glucose: Glucose levels running consistently in the 110s. Will order Hgb A1C.  Dispo: TBD. PT/OT recommending SNF. TOC following. Her husband is unable to help take care of her at home. Remains inpatient due to uncontrollable pain, discharge planning, IV antibiotics.   Contact information:  Dr. Ophelia Charter, Noemi Chapel PA-C, After hours and holidays please check [email or web address] for group call information for [facility]  Noemi Chapel, PA-C 09/13/2020

## 2020-09-13 NOTE — Progress Notes (Signed)
PHARMACY CONSULT NOTE FOR:  OUTPATIENT  PARENTERAL ANTIBIOTIC THERAPY (OPAT)  Indication: Septic arthritis  Regimen: Vancomycin 1250 mg IV every 12 hours + Ceftriaxone 2 gm IV Q 24 hours  End date: 10/02/20  IV antibiotic discharge orders are pended. To discharging provider:  please sign these orders via discharge navigator,  Select New Orders & click on the button choice - Manage This Unsigned Work.     Thank you for allowing pharmacy to be a part of this patient's care.  Jimmy Footman, PharmD, BCPS, BCIDP Infectious Diseases Clinical Pharmacist Phone: [phone] 09/13/2020, 8:58 AM

## 2020-09-13 NOTE — Progress Notes (Signed)
Physical Therapy Treatment Patient Details Name: [name] MRN: [id number] DOB: 08/26/1955 Today's Date: 09/13/2020    History of Present Illness Patient had a left knee surgery with proximal tibial hardware removal, tibial incision debridement of deep abscess., left knee scope with medial femoral chondroplasty, lysis of adhesions and manipulation under anesthesia on 08/28/2020. There was concern about infection of the hardware as there was an area of purulent material. She is now s/p Left knee incision and debridement of deep abscess on 09/11/20,  Left knee septic joint incision and debridement on 5/25 with hemovac and wound vac.  Other PMH includes depression, arthritis, anxiety.    PT Comments    POD # 2 General bed mobility comments: pt self able to perform SLR and self transition to EOB.  Assist with safety of VAC tubing and drain bulb General transfer comment: pt self able to rise from bed as well as on/off toilet with good safety cogntion and use of hands to steady self General Gait Details: pt able to amb a limited distance with her personal walker to and from bathroom a total of 30 feet but then unable in hallway due to increased pain. unsteady esp with turns. Pt will need ST Rehab at SNF prior to safely returning home.    Follow Up Recommendations  SNF     Equipment Recommendations  None recommended by PT    Recommendations for Other Services       Precautions / Restrictions Precautions Precautions: Fall Precaution Comments: Recent fall at home per ortho note; wound vac and hemovac Restrictions Weight Bearing Restrictions: No LLE Weight Bearing: Weight bearing as tolerated    Mobility  Bed Mobility Overal bed mobility: Needs Assistance Bed Mobility: Supine to Sit     Supine to sit: Supervision     General bed mobility comments: pt self able to perform SLR and self transition to EOB.  Assist with safety of VAC tubing and drain bulb    Transfers Overall transfer  level: Needs assistance Equipment used: Rolling walker (2 wheeled) Transfers: Sit to/from [facility] Sit to Stand: Min assist Stand pivot transfers: Min assist       General transfer comment: pt self able to rise from bed as well as on/off toilet with good safety cogntion and use of hands to steady self  Ambulation/Gait Ambulation/Gait assistance: Min assist Gait Distance (Feet): 30 Feet Assistive device: Rolling walker (2 wheeled) Gait Pattern/deviations: Step-to pattern;Decreased stance time - left;Decreased weight shift to left Gait velocity: decreased   General Gait Details: pt able to amb a limited distance with her personal walker to and from bathroom a total of 30 feet but then unable in hallway due to increased pain. unsteady esp with turns.   Stairs             Wheelchair Mobility    Modified Rankin (Stroke Patients Only)       Balance                                            Cognition Arousal/Alertness: Awake/alert Behavior During Therapy: WFL for tasks assessed/performed Overall Cognitive Status: Within Functional Limits for tasks assessed                                 General Comments: AxO x 3 very  motivated and funny "loves life" positive attitude      Exercises      General Comments        Pertinent Vitals/Pain Pain Assessment: 0-10 Pain Score: 5  Pain Location: L knee Pain Descriptors / Indicators: Aching;Grimacing;Guarding;Cramping;Sharp;Stabbing;Burning;Discomfort Pain Intervention(s): Monitored during session;Repositioned;Ice applied    Home Living                      Prior Function            PT Goals (current goals can now be found in the care plan section)      Frequency    Min 5X/week      PT Plan Current plan remains appropriate    Co-evaluation              AM-PAC PT "6 Clicks" Mobility   Outcome Measure  Help needed turning from your back  to your side while in a flat bed without using bedrails?: A Little Help needed moving from lying on your back to sitting on the side of a flat bed without using bedrails?: A Little Help needed moving to and from a bed to a chair (including a wheelchair)?: A Little Help needed standing up from a chair using your arms (e.g., wheelchair or bedside chair)?: A Little Help needed to walk in hospital room?: A Little Help needed climbing 3-5 steps with a railing? : A Lot 6 Click Score: 17    End of Session Equipment Utilized During Treatment: Gait belt Activity Tolerance: Patient limited by pain Patient left: in chair;with call bell/phone within reach Nurse Communication: Mobility status PT Visit Diagnosis: Unsteadiness on feet (R26.81);Pain;Muscle weakness (generalized) (M62.81) Pain - Right/Left: Left Pain - part of body: Knee     Time: 1791-5056 PT Time Calculation (min) (ACUTE ONLY): 29 min  Charges:  $Gait Training: 8-22 mins $Therapeutic Activity: 8-22 mins                     Rica Koyanagi  PTA Acute  Rehabilitation Services Pager      [phone] Office      [phone]

## 2020-09-13 NOTE — TOC Progression Note (Addendum)
Transition of Care [facility]) - Progression Note   Patient Details  Name: [name] MRN: [id number] Date of Birth: 14-Apr-1956  Transition of Care [facility]) CM/SW [place], LCSW Phone Number: 09/13/2020, 1:24 PM  Clinical Narrative: CSW provided patient with bed offers. Patient asked about [facility], however, per Doctor Phillips in admissions the facility is not currently accepting patients for SNF. Patient has selected [facility]. CSW called Claiborne Billings with [facility], who has started insurance authorization. CSW asked PA to order COVID booster so that patient will not need to quarantine at the facility. TOC awaiting insurance authorization, which may not be received before the holiday weekend.  4:09pm: Per Claiborne Billings with [facility], insurance authorization has not been received.  Expected Discharge Plan: [place] Barriers to Discharge: Continued Medical Work up,SNF Pending bed offer  Expected Discharge Plan and Services Expected Discharge Plan: [place] In-house Referral: Clinical Social Work Post Acute Care Choice: [place] Living arrangements for the past 2 months: Single Family Home              DME Arranged: N/A DME Agency: NA  Readmission Risk Interventions Readmission Risk Prevention Plan 02/27/2020  Post Dischage Appt Complete  Medication Screening Complete  Transportation Screening Complete  Some recent data might be hidden

## 2020-09-14 LAB — CBC
HCT: 35.7 % — ABNORMAL LOW (ref 36.0–46.0)
Hemoglobin: 11.3 g/dL — ABNORMAL LOW (ref 12.0–15.0)
MCH: 30.2 pg (ref 26.0–34.0)
MCHC: 31.7 g/dL (ref 30.0–36.0)
MCV: 95.5 fL (ref 80.0–100.0)
Platelets: 249 10*3/uL (ref 150–400)
RBC: 3.74 MIL/uL — ABNORMAL LOW (ref 3.87–5.11)
RDW: 16.2 % — ABNORMAL HIGH (ref 11.5–15.5)
WBC: 7.1 10*3/uL (ref 4.0–10.5)
nRBC: 0 % (ref 0.0–0.2)

## 2020-09-14 LAB — GLUCOSE, CAPILLARY
Glucose-Capillary: 95 mg/dL (ref 70–99)
Glucose-Capillary: 101 mg/dL — ABNORMAL HIGH (ref 70–99)
Glucose-Capillary: 101 mg/dL — ABNORMAL HIGH (ref 70–99)
Glucose-Capillary: 124 mg/dL — ABNORMAL HIGH (ref 70–99)

## 2020-09-14 LAB — BASIC METABOLIC PANEL
Anion gap: 6 (ref 5–15)
BUN: 12 mg/dL (ref 8–23)
CO2: 34 mmol/L — ABNORMAL HIGH (ref 22–32)
Calcium: 9 mg/dL (ref 8.9–10.3)
Chloride: 100 mmol/L (ref 98–111)
Creatinine, Ser: 0.37 mg/dL — ABNORMAL LOW (ref 0.44–1.00)
GFR, Estimated: >60 mL/min (ref >60)
Glucose, Bld: 115 mg/dL — ABNORMAL HIGH (ref 70–99)
Potassium: 4.2 mmol/L (ref 3.5–5.1)
Sodium: 140 mmol/L (ref 135–145)

## 2020-09-14 LAB — VANCOMYCIN, TROUGH: Vancomycin Tr: 10 ug/mL — ABNORMAL LOW (ref 15–20)

## 2020-09-14 MED ORDER — SODIUM CHLORIDE 0.9% FLUSH
10.0000 mL | Freq: Two times a day (BID) | INTRAVENOUS | Status: DC
  Administered 2020-09-15 – 2020-09-20 (×3): 10 mL

## 2020-09-14 MED ORDER — SODIUM CHLORIDE 0.9% FLUSH
10.0000 mL | INTRAVENOUS | Status: DC | PRN
  Administered 2020-09-19: 10 mL

## 2020-09-14 MED ORDER — CHLORHEXIDINE GLUCONATE CLOTH 2 % EX PADS
6.0000 | MEDICATED_PAD | Freq: Every day | CUTANEOUS | Status: DC
  Administered 2020-09-14 – 2020-09-19 (×5): 6 via TOPICAL

## 2020-09-14 MED ORDER — VANCOMYCIN HCL 1500 MG/300ML IV SOLN
1500.0000 mg | Freq: Two times a day (BID) | INTRAVENOUS | Status: DC
  Administered 2020-09-14 – 2020-09-20 (×13): 1500 mg via INTRAVENOUS
  Filled 2020-09-14 (×12): qty 300

## 2020-09-14 NOTE — Progress Notes (Signed)
Physical Therapy Treatment Patient Details Name: [name] MRN: [id number] DOB: May 13, 1955 Today's Date: 09/14/2020    History of Present Illness Patient had a left knee surgery with proximal tibial hardware removal, tibial incision debridement of deep abscess., left knee scope with medial femoral chondroplasty, lysis of adhesions and manipulation under anesthesia on 08/28/2020. There was concern about infection of the hardware as there was an area of purulent material. She is now s/p Left knee incision and debridement of deep abscess on 09/11/20,  Left knee septic joint incision and debridement on 5/25 with hemovac and wound vac.  Other PMH includes depression, arthritis, anxiety.    PT Comments    Pt making gradual progress. Treatment limited due to pt having to leave for MRI.  Continue to advance as able.    Follow Up Recommendations  SNF     Equipment Recommendations  None recommended by PT    Recommendations for Other Services       Precautions / Restrictions Precautions Precautions: Fall Precaution Comments: Recent fall at home per ortho note Restrictions LLE Weight Bearing: Weight bearing as tolerated    Mobility  Bed Mobility Overal bed mobility: Needs Assistance Bed Mobility: Supine to Sit     Supine to sit: Supervision;HOB elevated     General bed mobility comments: supervision for lines    Transfers Overall transfer level: Needs assistance Equipment used: Rolling walker (2 wheeled) Transfers: Sit to/from Stand Sit to Stand: Min assist         General transfer comment: Min A to steady; Sit to stand from bed and toilet; cues for L LE management  Ambulation/Gait Ambulation/Gait assistance: Min assist Gait Distance (Feet): 20 Feet (20'x2) Assistive device: Rolling walker (2 wheeled) Gait Pattern/deviations: Step-to pattern;Decreased stance time - left;Decreased weight shift to left Gait velocity: decreased   General Gait Details: Ambulated to restroom  then out to hall to w/c with transport.  LImited due to pain.  Cues for RW and min A to steady with turns   Stairs             Wheelchair Mobility    Modified Rankin (Stroke Patients Only)       Balance Overall balance assessment: Needs assistance;History of Falls Sitting-balance support: No upper extremity supported Sitting balance-Leahy Scale: Good     Standing balance support: No upper extremity supported Standing balance-Leahy Scale: Fair Standing balance comment: RW for ambulation but able to stand and wash hands at sink statically                            Cognition Arousal/Alertness: Awake/alert Behavior During Therapy: WFL for tasks assessed/performed Overall Cognitive Status: Within Functional Limits for tasks assessed                                 General Comments: AxO x 4 very motivated and funny "loves life" positive attitude      Exercises      General Comments General comments (skin integrity, edema, etc.): Held exercises due to transport to take pt to MRI      Pertinent Vitals/Pain Pain Assessment: 0-10 Pain Score: 7  Pain Location: L knee Pain Descriptors / Indicators: Stabbing Pain Intervention(s): Limited activity within patient's tolerance;Monitored during session;Premedicated before session    Home Living  Prior Function            PT Goals (current goals can now be found in the care plan section) Acute Rehab PT Goals Patient Stated Goal: To walk without pain PT Goal Formulation: With patient Time For Goal Achievement: 09/26/20 Potential to Achieve Goals: Good Progress towards PT goals: Progressing toward goals    Frequency    Min 5X/week      PT Plan Current plan remains appropriate    Co-evaluation              AM-PAC PT "6 Clicks" Mobility   Outcome Measure  Help needed turning from your back to your side while in a flat bed without using bedrails?: A  Little Help needed moving from lying on your back to sitting on the side of a flat bed without using bedrails?: A Little Help needed moving to and from a bed to a chair (including a wheelchair)?: A Little Help needed standing up from a chair using your arms (e.g., wheelchair or bedside chair)?: A Little Help needed to walk in hospital room?: A Little Help needed climbing 3-5 steps with a railing? : A Lot 6 Click Score: 17    End of Session Equipment Utilized During Treatment: Gait belt Activity Tolerance: Other (comment) (pt had to go for MRI) Patient left: Other (comment) (in w/c with transport staff) Nurse Communication: Mobility status PT Visit Diagnosis: Unsteadiness on feet (R26.81);Pain;Muscle weakness (generalized) (M62.81) Pain - Right/Left: Left Pain - part of body: Knee     Time: 6962-9528 PT Time Calculation (min) (ACUTE ONLY): 20 min  Charges:  $Gait Training: 8-22 mins                     Abran Richard, PT Acute Rehab Services Pager [phone] [facility] 09/14/2020, 4:01 PM

## 2020-09-14 NOTE — Progress Notes (Signed)
Peripherally Inserted Central Catheter Placement  The IV Nurse has discussed with the patient and/or persons authorized to consent for the patient, the purpose of this procedure and the potential benefits and risks involved with this procedure.  The benefits include less needle sticks, lab draws from the catheter, and the patient may be discharged home with the catheter. Risks include, but not limited to, infection, bleeding, blood clot (thrombus formation), and puncture of an artery; nerve damage and irregular heartbeat and possibility to perform a PICC exchange if needed/ordered by physician.  Alternatives to this procedure were also discussed.  Bard Power PICC patient education guide, fact sheet on infection prevention and patient information card has been provided to patient /or left at bedside.    PICC Placement Documentation  PICC Single Lumen 60/15/61 Right Basilic 41 cm 0 cm (Active)  Indication for Insertion or Continuance of Line Home intravenous therapies (PICC only) 09/14/20 0845  Exposed Catheter (cm) 0 cm 09/14/20 0845  Site Assessment Clean;Dry;Intact 09/14/20 0845  Line Status Saline locked;Blood return noted;Flushed 09/14/20 0845  Dressing Type Transparent 09/14/20 0845  Dressing Status Clean;Dry;Intact 09/14/20 0845  Antimicrobial disc in place? Yes 09/14/20 0845  Safety Lock Not Applicable 53/79/43 2761  Line Care Tubing changed;Connections checked and tightened 09/14/20 0845  Line Adjustment (NICU/IV Team Only) No 09/14/20 0845  Dressing Intervention New dressing 09/14/20 0845  Dressing Change Due 09/21/20 09/14/20 0845       Rolena Infante 09/14/2020, 8:46 AM

## 2020-09-14 NOTE — Progress Notes (Addendum)
Pharmacy Antibiotic Note  [name] is a 65 y.o. female admitted on 09/11/2020 with knee abscess.  Pharmacy has been consulted for vancomycin dosing.  PMH significant for left tibial fracture requiring ORIF in November 2021. Returned to OR on 08/28/20 for hardware removal, debridement of abscess - cultures were negative. Pt underwent  I&D on 5/25, ID consulted. Vancomycin being started for wound infection, concern for infected joint. Cefazolin 3 g dose given pre-op. Tobramycin + vancomycin powder given topically in OR.   Today, 09/14/20  D3/21 abx  Leukocytosis resolved  Remains afebrile  SCr low/stable  Calculated vanc AUC 379 on 1250 mg q12 hr (with SCr 0.37); T1/2 = 6.3 hr  Plan:  Increase vancomycin to 1500 mg IV q12 hr (new eAUC 453 while SCr remains ~0.37)  Continue Rocephin 2g IV q24 hr per MD; dosing appropriate  Plan for 3 wks abx  OPAT orders updated with new vanc regimen  Hereafter, Pharmacy will only leave notes as required by dose adjustments or other notable changes  Height: 5\' 8"  (172.7 cm) Weight: (!) 149.7 kg (330 lb) IBW/kg (Calculated) : 63.9  Temp (24hrs), Avg:98.2 F (36.8 C), Min:97.7 F (36.5 C), Max:98.5 F (36.9 C)  Recent Labs  Lab 09/11/20 1552 09/12/20 0321 09/13/20 0325 09/13/20 1250 09/13/20 2217 09/14/20 0503  WBC 11.5* 9.5 8.2  --   --  7.1  CREATININE 0.64 0.42* 0.47 0.61  --  0.37*  VANCOTROUGH  --   --   --   --   --  10*  VANCOPEAK  --   --   --   --  21*  --     Estimated Creatinine Clearance: 110.1 mL/min (A) (by C-G formula based on SCr of 0.37 mg/dL (L)).    No Known Allergies  Antimicrobials this admission: vancomycin 5/25 >>  Ceftriaxone 5/25 >>   Dose adjustments this admission: 5/28 increase vanc 1250 >> 1500 q12 based on low AUC  Microbiology results: 5/25 surgical/deep wound left knee x2: ng2d - Fungus Cx x2: pending; 2/2 negative KOH stain 5/11: L knee synovial fluid: ngF   Thank you for allowing  pharmacy to be a part of this patient's care.  Khylei Wilms, Cindie Laroche, PharmD 09/14/2020 8:34 AM

## 2020-09-14 NOTE — Progress Notes (Signed)
Received consult for PICC line site assessment. Some mild oozing of blood noted under transparent dressing. No active bleeding. Dressing intact. Reassured patient the site is WNL, and to report to the RN any further s/s bleeding. Primary RN aware.

## 2020-09-14 NOTE — Progress Notes (Signed)
Subjective: 3 Days Post-Op Procedure(s) (LRB): Irrigation and debridement of deep abscess- left leg, septic joint (Left) Patient reports pain as moderate.    Objective: Vital signs in last 24 hours: Temp:  [97.7 F (36.5 C)-98.5 F (36.9 C)] 98.5 F (36.9 C) (05/28 0529) Pulse Rate:  [64-73] 68 (05/28 0529) Resp:  [17-18] 17 (05/28 0529) BP: (155-187)/(74-78) 155/78 (05/28 0529) SpO2:  [95 %-97 %] 95 % (05/28 0529)  Intake/Output from previous day: 05/27 0701 - 05/28 0700 In: 1490 [P.O.:240; IV Piggyback:1250] Out: 6200 [Urine:6200] Intake/Output this shift: Total I/O In: 350 [P.O.:350] Out: 2300 [Urine:2300]  Recent Labs    09/11/20 1552 09/12/20 0321 09/13/20 0325 09/14/20 0503  HGB 14.2 11.5* 11.0* 11.3*   Recent Labs    09/13/20 0325 09/14/20 0503  WBC 8.2 7.1  RBC 3.70* 3.74*  HCT 35.4* 35.7*  PLT 238 249   Recent Labs    09/13/20 1250 09/14/20 0503  NA 138 140  K 4.3 4.2  CL 105 100  CO2 28 34*  BUN 12 12  CREATININE 0.61 0.37*  GLUCOSE 104* 115*  CALCIUM 9.2 9.0   No results for input(s): LABPT, INR in the last 72 hours.  Neurovascular intact Sensation intact distally Intact pulses distally Dorsiflexion/Plantar flexion intact Incision: incisional vac and hemovac in place minimal output.  both were removed today and covered with bulky dsg No cellulitis present   Assessment/Plan: 3 Days Post-Op Procedure(s) (LRB): Irrigation and debridement of deep abscess- left leg, septic joint (Left) Up with therapy  wbat LLE dsg change prn Pain control as ordered lovenox dvt proph Cont abx per ID D/c planning awaiting bed placement SNF    [facility] 09/14/2020, 12:26 PM

## 2020-09-15 LAB — CBC
HCT: 38 % (ref 36.0–46.0)
Hemoglobin: 12.2 g/dL (ref 12.0–15.0)
MCH: 30.3 pg (ref 26.0–34.0)
MCHC: 32.1 g/dL (ref 30.0–36.0)
MCV: 94.3 fL (ref 80.0–100.0)
Platelets: 235 10*3/uL (ref 150–400)
RBC: 4.03 MIL/uL (ref 3.87–5.11)
RDW: 15.9 % — ABNORMAL HIGH (ref 11.5–15.5)
WBC: 6.8 10*3/uL (ref 4.0–10.5)
nRBC: 0 % (ref 0.0–0.2)

## 2020-09-15 LAB — BASIC METABOLIC PANEL
Anion gap: 5 (ref 5–15)
BUN: 12 mg/dL (ref 8–23)
CO2: 34 mmol/L — ABNORMAL HIGH (ref 22–32)
Calcium: 9.1 mg/dL (ref 8.9–10.3)
Chloride: 99 mmol/L (ref 98–111)
Creatinine, Ser: 0.58 mg/dL (ref 0.44–1.00)
GFR, Estimated: >60 mL/min (ref >60)
Glucose, Bld: 122 mg/dL — ABNORMAL HIGH (ref 70–99)
Potassium: 4 mmol/L (ref 3.5–5.1)
Sodium: 138 mmol/L (ref 135–145)

## 2020-09-15 LAB — GLUCOSE, CAPILLARY
Glucose-Capillary: 106 mg/dL — ABNORMAL HIGH (ref 70–99)
Glucose-Capillary: 108 mg/dL — ABNORMAL HIGH (ref 70–99)
Glucose-Capillary: 125 mg/dL — ABNORMAL HIGH (ref 70–99)
Glucose-Capillary: 141 mg/dL — ABNORMAL HIGH (ref 70–99)

## 2020-09-15 NOTE — Progress Notes (Addendum)
Physical Therapy Treatment Patient Details Name: [name] MRN: [id number] DOB: 24-Jan-1956 Today's Date: 09/15/2020    History of Present Illness Patient had a left knee surgery with proximal tibial hardware removal, tibial incision debridement of deep abscess., left knee scope with medial femoral chondroplasty, lysis of adhesions and manipulation under anesthesia on 08/28/2020. There was concern about infection of the hardware as there was an area of purulent material. She is now s/p Left knee incision and debridement of deep abscess on 09/11/20,  Left knee septic joint incision and debridement on 5/25 with hemovac and wound vac.  Other PMH includes depression, arthritis, anxiety. MRI left shoulder reveals mild to moderate tendinosis of supraspinatus and infraspinatus tendon.  Muscle edema noted in the supraspinatus muscle and musculotendinous junction, likely muscle strain with partial tear.      PT Comments    Pt progressing with amb distance/tolerance to activity. Continue PT in acute setting.  Follow Up Recommendations  SNF (vs HHPT pending progress)     Equipment Recommendations  None recommended by PT    Recommendations for Other Services       Precautions / Restrictions Precautions Precautions: Fall Precaution Comments: Recent fall at home per ortho note Restrictions Weight Bearing Restrictions: No LLE Weight Bearing: Weight bearing as tolerated    Mobility  Bed Mobility Overal bed mobility: Needs Assistance Bed Mobility: Supine to Sit;Sit to Supine     Supine to sit: Supervision;HOB elevated Sit to supine: Min guard   General bed mobility comments: supervision for lines and safety    Transfers Overall transfer level: Needs assistance Equipment used: Rolling walker (2 wheeled) Transfers: Sit to/from Stand Sit to Stand: Min guard         General transfer comment: for safety on rising and transition to  RW  Ambulation/Gait Ambulation/Gait assistance: Min  guard Gait Distance (Feet): 60 Feet Assistive device: Rolling walker (2 wheeled) Gait Pattern/deviations: Step-to pattern;Decreased stance time - left;Decreased weight shift to left Gait velocity: decreased   General Gait Details: cues for sequence, distance to tolerance; min/guard for safety with turns   Stairs             Wheelchair Mobility    Modified Rankin (Stroke Patients Only)       Balance           Standing balance support: No upper extremity supported Standing balance-Leahy Scale: Fair Standing balance comment: RW for amb, able to stand and don mask                            Cognition Arousal/Alertness: Awake/alert Behavior During Therapy: WFL for tasks assessed/performed Overall Cognitive Status: Within Functional Limits for tasks assessed                                 General Comments: AxO x 4 very motivated and funny "loves life" positive attitude      Exercises General Exercises - Lower Extremity Ankle Circles/Pumps: AROM;Both;10 reps    General Comments        Pertinent Vitals/Pain Pain Assessment: 0-10 Pain Score: 9  Pain Location: L knee Pain Descriptors / Indicators: Discomfort;Grimacing;Sore Pain Intervention(s): Limited activity within patient's tolerance;Monitored during session;Premedicated before session;Repositioned;Ice applied    Home Living                      Prior Function  PT Goals (current goals can now be found in the care plan section) Acute Rehab PT Goals Patient Stated Goal: To walk without pain PT Goal Formulation: With patient Time For Goal Achievement: 09/26/20 Potential to Achieve Goals: Good Progress towards PT goals: Progressing toward goals    Frequency    Min 5X/week      PT Plan Current plan remains appropriate    Co-evaluation              AM-PAC PT "6 Clicks" Mobility   Outcome Measure  Help needed turning from your back to your  side while in a flat bed without using bedrails?: A Little Help needed moving from lying on your back to sitting on the side of a flat bed without using bedrails?: A Little Help needed moving to and from a bed to a chair (including a wheelchair)?: A Little Help needed standing up from a chair using your arms (e.g., wheelchair or bedside chair)?: A Little Help needed to walk in hospital room?: A Little Help needed climbing 3-5 steps with a railing? : A Lot 6 Click Score: 17    End of Session Equipment Utilized During Treatment: Gait belt Activity Tolerance: Patient tolerated treatment well Patient left: in bed;with call bell/phone within reach;with bed alarm set   PT Visit Diagnosis: Unsteadiness on feet (R26.81);Pain;Muscle weakness (generalized) (M62.81) Pain - Right/Left: Left Pain - part of body: Knee     Time: 0370-4888 PT Time Calculation (min) (ACUTE ONLY): 20 min  Charges:  $Gait Training: 8-22 mins                     Baxter Flattery, PT  Acute Rehab Dept ([place]) [phone] Pager [phone]  09/15/2020    [facility] 09/15/2020, 2:44 PM

## 2020-09-15 NOTE — Progress Notes (Signed)
Orthopaedic Trauma Progress Note  SUBJECTIVE: Reports moderate pain this morning in her left knee. Also continues to have significant pain in left femur which she states is relatively unchanged from several months ago. She did have her left hip injected by Dr. Griffin Basil a few weeks ago, she does not feel that it has made any difference but notes it is hard to differentiate where in the leg her pain is coming from (hip vs knee). I briefly went over patient's MRI results of her left knee with her today. She feels she has a better understanding of the finding at this point. Overall, she remains extremely frustrated with her continued pain and lack of progress. After having infection in left knee, patient is wondering if she also has an infection involving her femoral hardware. States she is seriously considering a hip disarticulation amputation if her pain does not improve.    Patient has been seen by ID, PICC line has been placed. Waiting for SNF bed   OBJECTIVE:  Vitals:   09/14/20 1923 09/15/20 0401  BP: (!) 146/65 (!) 162/74  Pulse: 64 70  Resp: 16 18  Temp: 97.7 F (36.5 C) 98.2 F (36.8 C)  SpO2: 99% 94%    General: Sitting up in bed, no acute distress Respiratory: No increased work of breathing.  Left lower extremity: Dressing CDI. No cellulitis noted to lower leg. Sensation intact distally. Intact pulses distally. Dorsiflexion/Plantarflexion intact  IMAGING: -  MRI left knee shows full-thickness cartilage loss of medial femoral tibial compartment.  Maceration of the posterior horn of the medial meniscus.  ACL, PCL, MCL, LCL all intact. -MRI left shoulder reveals mild to moderate tendinosis of supraspinatus and infraspinatus tendon.  Muscle edema noted in the supraspinatus muscle and musculotendinous junction, likely muscle strain with partial tear.  LABS:  Results for orders placed or performed during the hospital encounter of 09/11/20 (from the past 24 hour(s))  Glucose, capillary      Status: Abnormal   Collection Time: 09/14/20 11:27 AM  Result Value Ref Range   Glucose-Capillary 101 (H) 70 - 99 mg/dL  Glucose, capillary     Status: Abnormal   Collection Time: 09/14/20  4:51 PM  Result Value Ref Range   Glucose-Capillary 101 (H) 70 - 99 mg/dL  Glucose, capillary     Status: Abnormal   Collection Time: 09/14/20  9:09 PM  Result Value Ref Range   Glucose-Capillary 124 (H) 70 - 99 mg/dL  CBC     Status: Abnormal   Collection Time: 09/15/20  3:00 AM  Result Value Ref Range   WBC 6.8 4.0 - 10.5 K/uL   RBC 4.03 3.87 - 5.11 MIL/uL   Hemoglobin 12.2 12.0 - 15.0 g/dL   HCT 38.0 36.0 - 46.0 %   MCV 94.3 80.0 - 100.0 fL   MCH 30.3 26.0 - 34.0 pg   MCHC 32.1 30.0 - 36.0 g/dL   RDW 15.9 (H) 11.5 - 15.5 %   Platelets 235 150 - 400 K/uL   nRBC 0.0 0.0 - 0.2 %  Basic metabolic panel     Status: Abnormal   Collection Time: 09/15/20  3:00 AM  Result Value Ref Range   Sodium 138 135 - 145 mmol/L   Potassium 4.0 3.5 - 5.1 mmol/L   Chloride 99 98 - 111 mmol/L   CO2 34 (H) 22 - 32 mmol/L   Glucose, Bld 122 (H) 70 - 99 mg/dL   BUN 12 8 - 23 mg/dL   Creatinine,  Ser 0.58 0.44 - 1.00 mg/dL   Calcium 9.1 8.9 - 10.3 mg/dL   GFR, Estimated >60 >60 mL/min   Anion gap 5 5 - 15  Glucose, capillary     Status: Abnormal   Collection Time: 09/15/20  7:50 AM  Result Value Ref Range   Glucose-Capillary 106 (H) 70 - 99 mg/dL    ASSESSMENT: Javeria Briski is a 65 y.o. female, 4 Days Post-Op s/p  Irrigation and debridement of deep abscess- left leg, septic joint  CV/Blood loss: Hgb 12.2 this AM. Hemodynamically stable  PLAN: Weightbearing: WBAT LLE Incisional and dressing care: Reinforce dressings as needed  Showering: Hold off for now. [place] for sponge bath Orthopedic device(s): None  Pain management: Continue current regimen. PRN pain medications, preferring oral medications. IceMan.  VTE prophylaxis: Lovenox, SCDs ID: Vancomycin plus ceftriaxone per ID Foley/Lines: No foley, KVO  IVFs Dispo: Therapies as tolerated.  Patient okay for discharge once SNF bed available Follow - up plan: 2 weeks with Dr. Damien Fusi information:  Katha Hamming MD, Patrecia Pace PA-C 09/15/20 and 09/16/20.   Chinonso Linker A. Ricci Barker, PA-C [phone] (office) [email or web address]

## 2020-09-16 LAB — CBC
HCT: 38.2 % (ref 36.0–46.0)
Hemoglobin: 12.3 g/dL (ref 12.0–15.0)
MCH: 30.1 pg (ref 26.0–34.0)
MCHC: 32.2 g/dL (ref 30.0–36.0)
MCV: 93.6 fL (ref 80.0–100.0)
Platelets: 239 10*3/uL (ref 150–400)
RBC: 4.08 MIL/uL (ref 3.87–5.11)
RDW: 15.6 % — ABNORMAL HIGH (ref 11.5–15.5)
WBC: 5.3 10*3/uL (ref 4.0–10.5)
nRBC: 0 % (ref 0.0–0.2)

## 2020-09-16 LAB — GLUCOSE, CAPILLARY
Glucose-Capillary: 117 mg/dL — ABNORMAL HIGH (ref 70–99)
Glucose-Capillary: 98 mg/dL (ref 70–99)
Glucose-Capillary: 115 mg/dL — ABNORMAL HIGH (ref 70–99)
Glucose-Capillary: 134 mg/dL — ABNORMAL HIGH (ref 70–99)

## 2020-09-16 LAB — AEROBIC/ANAEROBIC CULTURE W GRAM STAIN (SURGICAL/DEEP WOUND)
Culture: NO GROWTH
Culture: NO GROWTH

## 2020-09-16 LAB — BASIC METABOLIC PANEL
Anion gap: 8 (ref 5–15)
BUN: 12 mg/dL (ref 8–23)
CO2: 30 mmol/L (ref 22–32)
Calcium: 8.9 mg/dL (ref 8.9–10.3)
Chloride: 101 mmol/L (ref 98–111)
Creatinine, Ser: 0.38 mg/dL — ABNORMAL LOW (ref 0.44–1.00)
GFR, Estimated: >60 mL/min (ref >60)
Glucose, Bld: 106 mg/dL — ABNORMAL HIGH (ref 70–99)
Potassium: 4 mmol/L (ref 3.5–5.1)
Sodium: 139 mmol/L (ref 135–145)

## 2020-09-16 NOTE — Progress Notes (Signed)
Orthopaedic Trauma Progress Note  SUBJECTIVE: Resting comfortably in bed this morning.  Pain appears well controlled currently. Continues to await SNF bed   OBJECTIVE:  Vitals:   09/15/20 2156 09/16/20 0513  BP: (!) 156/61 (!) 161/79  Pulse: 73 70  Resp: 17 16  Temp: 98.4 F (36.9 C) 98.3 F (36.8 C)  SpO2: 95% 95%    General: Sleeping, no acute distress Respiratory: No increased work of breathing.  Left lower extremity: Dressing CDI. No cellulitis noted to lower leg. Sensation intact distally. Intact pulses distally.   IMAGING: -  MRI left knee shows full-thickness cartilage loss of medial femoral tibial compartment.  Maceration of the posterior horn of the medial meniscus.  ACL, PCL, MCL, LCL all intact. -MRI left shoulder reveals mild to moderate tendinosis of supraspinatus and infraspinatus tendon.  Muscle edema noted in the supraspinatus muscle and musculotendinous junction, likely muscle strain with partial tear.  LABS:  Results for orders placed or performed during the hospital encounter of 09/11/20 (from the past 24 hour(s))  Glucose, capillary     Status: Abnormal   Collection Time: 09/15/20 11:47 AM  Result Value Ref Range   Glucose-Capillary 108 (H) 70 - 99 mg/dL  Glucose, capillary     Status: Abnormal   Collection Time: 09/15/20  4:56 PM  Result Value Ref Range   Glucose-Capillary 125 (H) 70 - 99 mg/dL  Glucose, capillary     Status: Abnormal   Collection Time: 09/15/20 10:28 PM  Result Value Ref Range   Glucose-Capillary 141 (H) 70 - 99 mg/dL  CBC     Status: Abnormal   Collection Time: 09/16/20  3:20 AM  Result Value Ref Range   WBC 5.3 4.0 - 10.5 K/uL   RBC 4.08 3.87 - 5.11 MIL/uL   Hemoglobin 12.3 12.0 - 15.0 g/dL   HCT 38.2 36.0 - 46.0 %   MCV 93.6 80.0 - 100.0 fL   MCH 30.1 26.0 - 34.0 pg   MCHC 32.2 30.0 - 36.0 g/dL   RDW 15.6 (H) 11.5 - 15.5 %   Platelets 239 150 - 400 K/uL   nRBC 0.0 0.0 - 0.2 %  Basic metabolic panel     Status: Abnormal    Collection Time: 09/16/20  3:20 AM  Result Value Ref Range   Sodium 139 135 - 145 mmol/L   Potassium 4.0 3.5 - 5.1 mmol/L   Chloride 101 98 - 111 mmol/L   CO2 30 22 - 32 mmol/L   Glucose, Bld 106 (H) 70 - 99 mg/dL   BUN 12 8 - 23 mg/dL   Creatinine, Ser 0.38 (L) 0.44 - 1.00 mg/dL   Calcium 8.9 8.9 - 10.3 mg/dL   GFR, Estimated >60 >60 mL/min   Anion gap 8 5 - 15  Glucose, capillary     Status: Abnormal   Collection Time: 09/16/20  7:28 AM  Result Value Ref Range   Glucose-Capillary 117 (H) 70 - 99 mg/dL    ASSESSMENT: [name] is a 65 y.o. female, 5 Days Post-Op s/p  Irrigation and debridement of deep abscess- left leg, septic joint  CV/Blood loss: Hgb 12.3 this AM. Hemodynamically stable  PLAN: Weightbearing: WBAT LLE Incisional and dressing care: Reinforce dressings as needed  Showering: Hold off for now. [place] for sponge bath Orthopedic device(s): None  Pain management: Continue current regimen. PRN pain medications, preferring oral medications. IceMan.  VTE prophylaxis: Lovenox, SCDs ID: Vancomycin plus ceftriaxone per ID Foley/Lines: No foley, KVO IVFs Dispo:  Therapies as tolerated.  Patient okay for discharge once SNF bed available Follow - up plan: 2 weeks with Dr. Damien Fusi information:  Katha Hamming MD, Patrecia Pace PA-C 09/15/20 and 09/16/20.   Jaretzi Droz A. Ricci Barker, PA-C [phone] (office) [email or web address]

## 2020-09-16 NOTE — Plan of Care (Signed)
  Problem: Nutrition: Goal: Adequate nutrition will be maintained Outcome: Progressing   Problem: Pain Managment: Goal: General experience of comfort will improve Outcome: Progressing   Problem: Skin Integrity: Goal: Risk for impaired skin integrity will decrease Outcome: Progressing   

## 2020-09-16 NOTE — Progress Notes (Signed)
Physical Therapy Treatment Patient Details Name: [name] MRN: [id number] DOB: July 24, 1955 Today's Date: 09/16/2020    History of Present Illness Patient had a left knee surgery with proximal tibial hardware removal, tibial incision debridement of deep abscess., left knee scope with medial femoral chondroplasty, lysis of adhesions and manipulation under anesthesia on 08/28/2020. There was concern about infection of the hardware as there was an area of purulent material. She is now s/p Left knee incision and debridement of deep abscess on 09/11/20,  Left knee septic joint incision and debridement on 5/25 with hemovac and wound vac.  Other PMH includes depression, arthritis, anxiety.  MRI left knee shows full-thickness cartilage loss of medial femoral tibial compartment.  Maceration of the posterior horn of the medial meniscus.  ACL, PCL, MCL, LCL all intact.  -MRI left shoulder reveals mild to moderate tendinosis of supraspinatus and infraspinatus tendon.  Muscle edema noted in the supraspinatus muscle and musculotendinous junction, likely muscle strain with partial tear.    PT Comments    Pt progressing well, limited by pain but remains motivated. Continue PT in acute setting  Follow Up Recommendations  SNF (vs HHPT pending progress)     Equipment Recommendations  None recommended by PT    Recommendations for Other Services       Precautions / Restrictions Precautions Precautions: Fall Precaution Comments: Recent fall at home per ortho note Restrictions LLE Weight Bearing: Weight bearing as tolerated    Mobility  Bed Mobility Overal bed mobility: Needs Assistance Bed Mobility: Supine to Sit;Sit to Supine     Supine to sit: Supervision;HOB elevated Sit to supine: Supervision   General bed mobility comments: supervision for lines and safety    Transfers Overall transfer level: Needs assistance Equipment used: Rolling walker (2 wheeled) Transfers: Sit to/from Stand Sit to  Stand: Min guard         General transfer comment: for safety on rising and transition to  RW  Ambulation/Gait Ambulation/Gait assistance: Min guard Gait Distance (Feet): 17 Feet (x2) Assistive device: Rolling walker (2 wheeled) Gait Pattern/deviations: Step-to pattern;Decreased stance time - left;Decreased weight shift to left Gait velocity: decreased   General Gait Details: cues for sequence, distance to tolerance; min/guard for safety with turns   Stairs             Wheelchair Mobility    Modified Rankin (Stroke Patients Only)       Balance           Standing balance support: No upper extremity supported Standing balance-Leahy Scale: Fair Standing balance comment: RW for amb, able to stand and perform her own peri-care                            Cognition Arousal/Alertness: Awake/alert Behavior During Therapy: WFL for tasks assessed/performed Overall Cognitive Status: Within Functional Limits for tasks assessed                                 General Comments: pt is very pleasant and motivated despite incr pain in L knee, hip pain better today, edema decr LLE      Exercises General Exercises - Lower Extremity Ankle Circles/Pumps: AROM;Both;10 reps    General Comments General comments (skin integrity, edema, etc.): assisted with doffing/donning depends while in  bed      Pertinent Vitals/Pain Pain Assessment: 0-10 Pain Score: 10-Worst pain ever Pain  Location: L knee Pain Descriptors / Indicators: Discomfort;Grimacing;Sore;Sharp;Pounding    Home Living                      Prior Function            PT Goals (current goals can now be found in the care plan section) Acute Rehab PT Goals Patient Stated Goal: To walk without pain PT Goal Formulation: With patient Time For Goal Achievement: 09/26/20 Potential to Achieve Goals: Good Progress towards PT goals: Progressing toward goals    Frequency    Min  5X/week      PT Plan Current plan remains appropriate    Co-evaluation              AM-PAC PT "6 Clicks" Mobility   Outcome Measure  Help needed turning from your back to your side while in a flat bed without using bedrails?: A Little Help needed moving from lying on your back to sitting on the side of a flat bed without using bedrails?: A Little Help needed moving to and from a bed to a chair (including a wheelchair)?: A Little Help needed standing up from a chair using your arms (e.g., wheelchair or bedside chair)?: A Little Help needed to walk in hospital room?: A Little Help needed climbing 3-5 steps with a railing? : A Lot 6 Click Score: 17    End of Session Equipment Utilized During Treatment: Gait belt Activity Tolerance: Patient tolerated treatment well Patient left: in bed;with call bell/phone within reach;with bed alarm set   PT Visit Diagnosis: Unsteadiness on feet (R26.81);Pain;Muscle weakness (generalized) (M62.81) Pain - Right/Left: Left Pain - part of body: Knee     Time: 1020-1050 PT Time Calculation (min) (ACUTE ONLY): 30 min  Charges:  $Gait Training: 8-22 mins $Therapeutic Activity: 8-22 mins                     Baxter Flattery, PT  Acute Rehab Dept ([place]) [phone] Pager [phone]  09/16/2020    [facility] 09/16/2020, 12:23 PM

## 2020-09-17 ENCOUNTER — Ambulatory Visit: Payer: BC Managed Care – PPO | Admitting: Physical Therapy

## 2020-09-17 DIAGNOSIS — M009 Pyogenic arthritis, unspecified: Secondary | ICD-10-CM | POA: Diagnosis present

## 2020-09-17 LAB — GLUCOSE, CAPILLARY
Glucose-Capillary: 125 mg/dL — ABNORMAL HIGH (ref 70–99)
Glucose-Capillary: 97 mg/dL (ref 70–99)
Glucose-Capillary: 104 mg/dL — ABNORMAL HIGH (ref 70–99)

## 2020-09-17 LAB — BASIC METABOLIC PANEL
Anion gap: 6 (ref 5–15)
BUN: 13 mg/dL (ref 8–23)
CO2: 30 mmol/L (ref 22–32)
Calcium: 8.9 mg/dL (ref 8.9–10.3)
Chloride: 102 mmol/L (ref 98–111)
Creatinine, Ser: 0.47 mg/dL (ref 0.44–1.00)
GFR, Estimated: >60 mL/min (ref >60)
Glucose, Bld: 104 mg/dL — ABNORMAL HIGH (ref 70–99)
Potassium: 3.9 mmol/L (ref 3.5–5.1)
Sodium: 138 mmol/L (ref 135–145)

## 2020-09-17 LAB — CBC
HCT: 38.6 % (ref 36.0–46.0)
Hemoglobin: 12.3 g/dL (ref 12.0–15.0)
MCH: 30.1 pg (ref 26.0–34.0)
MCHC: 31.9 g/dL (ref 30.0–36.0)
MCV: 94.6 fL (ref 80.0–100.0)
Platelets: 240 10*3/uL (ref 150–400)
RBC: 4.08 MIL/uL (ref 3.87–5.11)
RDW: 15.6 % — ABNORMAL HIGH (ref 11.5–15.5)
WBC: 7.3 10*3/uL (ref 4.0–10.5)
nRBC: 0 % (ref 0.0–0.2)

## 2020-09-17 LAB — SARS CORONAVIRUS 2 (TAT 6-24 HRS): SARS Coronavirus 2: NEGATIVE

## 2020-09-17 LAB — VANCOMYCIN, PEAK: Vancomycin Pk: 28 ug/mL — ABNORMAL LOW (ref 30–40)

## 2020-09-17 MED ORDER — CELECOXIB 200 MG PO CAPS
200.0000 mg | ORAL_CAPSULE | Freq: Two times a day (BID) | ORAL | Status: DC
  Administered 2020-09-17 – 2020-09-20 (×7): 200 mg via ORAL
  Filled 2020-09-17 (×7): qty 1

## 2020-09-17 MED ORDER — ACETAMINOPHEN 325 MG PO TABS
650.0000 mg | ORAL_TABLET | Freq: Three times a day (TID) | ORAL | Status: DC
  Administered 2020-09-17 – 2020-09-20 (×10): 650 mg via ORAL
  Filled 2020-09-17 (×10): qty 2

## 2020-09-17 NOTE — TOC Progression Note (Signed)
Transition of Care [facility]) - Progression Note   Patient Details  Name: [name] MRN: [id number] Date of Birth: 1955/07/05  Transition of Care [facility]) CM/SW [place], LCSW Phone Number: 09/17/2020, 1:52 PM  Clinical Narrative: Per Claiborne Billings at [facility], insurance authorization was received. Patient's COVID test was negative. Due to knee pain, patient will not be discharging to SNF today. CSW updated [place]. TOC to follow.  Expected Discharge Plan: [place] Barriers to Discharge: Continued Medical Work up,SNF Pending bed offer  Expected Discharge Plan and Services Expected Discharge Plan: [place] In-house Referral: Clinical Social Work Post Acute Care Choice: [place] Living arrangements for the past 2 months: Single Family Home             DME Arranged: N/A DME Agency: NA  Readmission Risk Interventions Readmission Risk Prevention Plan 02/27/2020  Post Dischage Appt Complete  Medication Screening Complete  Transportation Screening Complete  Some recent data might be hidden

## 2020-09-17 NOTE — Progress Notes (Signed)
Physical Therapy Treatment Patient Details Name: [name] MRN: [id number] DOB: 07-Nov-1955 Today's Date: 09/17/2020    History of Present Illness Patient had a left knee surgery with proximal tibial hardware removal, tibial incision debridement of deep abscess., left knee scope with medial femoral chondroplasty, lysis of adhesions and manipulation under anesthesia on 08/28/2020. There was concern about infection of the hardware as there was an area of purulent material. She is now s/p Left knee incision and debridement of deep abscess on 09/11/20,  Left knee septic joint incision and debridement on 5/25 with hemovac and wound vac.  Other PMH includes depression, arthritis, anxiety.  MRI left knee shows full-thickness cartilage loss of medial femoral tibial compartment.  Maceration of the posterior horn of the medial meniscus.  ACL, PCL, MCL, LCL all intact.  -MRI left shoulder reveals mild to moderate tendinosis of supraspinatus and infraspinatus tendon.  Muscle edema noted in the supraspinatus muscle and musculotendinous junction, likely muscle strain with partial tear.    PT Comments    Pt continues to be motivated despite unrelenting pain. Pt states she is emotional today, frustrated with pain and being in hospital.  Pt able to tol  incr amb distance today, pt is mod I with bed mobility. Pt was able to go outside yesterday with her spouse, pleased to have been out of the room. Plan is for SNF post acute  Continue in acute setting  Follow Up Recommendations  SNF (vs HHPT pending progress)     Equipment Recommendations  None recommended by PT    Recommendations for Other Services       Precautions / Restrictions Precautions Precautions: Fall Precaution Comments: Recent fall at home per ortho note Restrictions Weight Bearing Restrictions: No LLE Weight Bearing: Weight bearing as tolerated    Mobility  Bed Mobility Overal bed mobility: Needs Assistance Bed Mobility: Supine to Sit;Sit  to Supine     Supine to sit: HOB elevated;Modified independent (Device/Increase time) Sit to supine: Modified independent (Device/Increase time)        Transfers Overall transfer level: Needs assistance Equipment used: Rolling walker (2 wheeled) Transfers: Sit to/from Stand Sit to Stand: Min guard;Supervision         General transfer comment: for safety on rising and transition to  RW  Ambulation/Gait Ambulation/Gait assistance: Min guard Gait Distance (Feet): 85 Feet Assistive device: Rolling walker (2 wheeled) Gait Pattern/deviations: Step-to pattern;Decreased stance time - left;Decreased weight shift to left Gait velocity: decreased   General Gait Details: cues for sequence, distance to tolerance; min/guard for safety with turns   Stairs             Wheelchair Mobility    Modified Rankin (Stroke Patients Only)       Balance           Standing balance support: No upper extremity supported Standing balance-Leahy Scale: Fair Standing balance comment: RW for amb, able to stand and perform her own peri-care                            Cognition Arousal/Alertness: Awake/alert Behavior During Therapy: WFL for tasks assessed/performed Overall Cognitive Status: Within Functional Limits for tasks assessed                                 General Comments: pt is very pleasant and motivated despite incr pain in L knee and hip  edema decr LLE      Exercises General Exercises - Lower Extremity Ankle Circles/Pumps: AROM;Both;10 reps Short Arc Quad:  (does not tol heel slides or knee flexion d/t pain) Straight Leg Raises: AROM;Left;5 reps    General Comments        Pertinent Vitals/Pain Pain Assessment: 0-10 Pain Score: 9  Pain Location: L knee Pain Descriptors / Indicators: Discomfort;Grimacing;Sore;Sharp;Pounding Pain Intervention(s): Limited activity within patient's tolerance;Monitored during session;Premedicated before  session;Repositioned;Ice applied    Home Living                      Prior Function            PT Goals (current goals can now be found in the care plan section) Acute Rehab PT Goals Patient Stated Goal: To walk without pain PT Goal Formulation: With patient Time For Goal Achievement: 09/26/20 Potential to Achieve Goals: Good Progress towards PT goals: Progressing toward goals    Frequency    Min 5X/week      PT Plan Current plan remains appropriate    Co-evaluation              AM-PAC PT "6 Clicks" Mobility   Outcome Measure  Help needed turning from your back to your side while in a flat bed without using bedrails?: A Little Help needed moving from lying on your back to sitting on the side of a flat bed without using bedrails?: None Help needed moving to and from a bed to a chair (including a wheelchair)?: A Little Help needed standing up from a chair using your arms (e.g., wheelchair or bedside chair)?: A Little Help needed to walk in hospital room?: A Little Help needed climbing 3-5 steps with a railing? : A Lot 6 Click Score: 18    End of Session Equipment Utilized During Treatment: Gait belt Activity Tolerance: Patient tolerated treatment well Patient left: in bed;with call bell/phone within reach;with bed alarm set   PT Visit Diagnosis: Unsteadiness on feet (R26.81);Pain;Muscle weakness (generalized) (M62.81) Pain - Right/Left: Left Pain - part of body: Knee     Time: 9379-0240 PT Time Calculation (min) (ACUTE ONLY): 37 min  Charges:  $Gait Training: 23-37 mins                     Baxter Flattery, PT  Acute Rehab Dept ([place]) [phone] Pager [phone]  09/17/2020    [facility] 09/17/2020, 12:19 PM

## 2020-09-17 NOTE — Discharge Summary (Addendum)
Patient ID: [name] MRN: [id number] DOB/AGE: 1955/08/07 65 y.o.  Admit date: 09/11/2020 Discharge date: 09/20/2020  Admission Diagnoses: Left knee cellulitis and abscess of lower extremity  Discharge Diagnoses:  Left knee superficial abscess and septic joint Principal Problem:   Septic arthritis of knee, left (HCC) Active Problems:   Essential hypertension   Morbid obesity (HCC)   Post-operative pain   Anxiety state   Cellulitis and abscess of lower extremity   Arthritis of left knee   Past Medical History:  Diagnosis Date  . Anxiety   . Arthritis   . Depression    Procedures Performed:  - Left knee incision and debridement of deep abscess - Left knee septic joint incision and debridement  Discharged Condition: good/stable  Hospital Course:  Patient had a left knee surgery with proximal tibial hardware removal, tibial incision debridement of deep abscess., left knee scope with medial femoral chondroplasty, lysis of adhesions and manipulation under anesthesia on 08/28/2020. There was concern about infection of the hardware as there was an area of purulent material. Specimens were collected. Cultures were negative. There were no signs of infection in the joint. She had uncontrollable pain after surgery. She had an ultrasound of the left knee there was no obvious left knee effusion, but she did have an area just medial to her hardware incision that was consistent with an abscess that tracked posteriorly. This was aspirated and sent for cultures. She is scheduled for repeat washout on 09/11/20. Successful completion of the planned procedure.  Patient had a clear infected hematoma deep to her fascia at the medial hardware site.  There was at the time of her hardware removal signs of infection so this was not particularly surprising however the fact that she got worse after hardware removal is a bit atypical.  She had a dry tap of her knee that we did prior to opening the wound and we  felt that there was low suspicion for septic joint at that point however when we opened her medial parapatellar incision our site of our previous closure of her knee scope portal within this wound was concerning and thus we opened up the knee and treated as if it was septic. Infectious Disease consulted. Recommended PICC line and IV antibiotics. PT/OT evaluated the patient who recommended SNF as her pain limits her ability to get around. Her husband cannot take care of her at home. PICC line was placed on 5/28. Patient continued to have uncontrollable pain. Unable to walk or tolerate ROM. She also complained of left shoulder pain from a previous falls. X-ray of the left shoulder should AC joint arthritis, but no acute bony injury. MRI of the left knee and shoulder were ordered, as she was unable to tolerate any range of motion due to extreme pain. MRI left shoulder reveals mild to moderate tendinosis of supraspinatus and infraspinatus tendon. Muscle edema noted in the supraspinatus muscle and musculotendinous junction, likely muscle strain. MRI of the left knee shows edema from the fracture but does not show any evidence of osteomyelitis or abscess.  The MRI scan does show bone-on-bone contact of the medial joint line with an extruded medial meniscus. Due to the extreme pain patient was having, we requested a consultation from another orthopedic surgeon, Dr. Sharol Given. Dr. Sharol Given saw the patient on 6/2. Per Dr. Jess Barters note: "Discussed with the patient that my recommendation would be to continue IV antibiotics as per infectious disease, discussed that with the involvement of the medial joint line with  traumatic arthritis patient may require in 4 to 6 months a total knee arthroplasty.  Discussed that she is at increased risk of developing an infection with implantation of total knee hardware.  Discussed the importance of strengthening and conditioning for the knee.  Patient states that she does not want to go through this  recovery process again, that it took her 3 years to go through the recovery process for the femur fracture, status post intramedullary nailing. " Patient became very emotional on 6/2, stating she did not want to live any more. Psychiatric consult was placed. Sheran Fava FNP saw the patient on 6/2. Per psychiatry note, "Start Abilify 43m po today. Then increase Abilify 565mpo daily for management of depression, suicidal thoughts. TOC referral for outpatient behavioral health services, she will benefit from Intensive Outpatient Programming (IOP) for behavioral therapy and coping skills. Most programs are virtual and can take place while receiving skilled nursing services.  Discussed with TOC (MJinny Blossomregarding referral and or resources, and was going to follow up after speaking with supervisor. [facility] " Patient's mood improved after speaking with the chaplain. A plan for pain management after she discharges from the hospital, rehab to work on conditioning the knee, and referral to orthopedic surgeon for discussion of a total knee in the future. Patient and her husband are in agreement with this plan.  She was found to be stable for discharge to SNF on 6/3.  Patient was instructed on specific activity restrictions and all questions were answered.  Consults: Infectious Disease, PT/OT, Second opinion from Dr. DuSharol GivenPsychiatry, Spiritual Care  Significant Diagnostic Studies: Cultures - no growth MRI left shoulder reveals mild to moderate tendinosis of supraspinatus and infraspinatus tendon. Muscle edema noted in the supraspinatus muscle and musculotendinous junction, likely muscle strain.   MRI of the left knee shows edema from the fracture but does not show any evidence of osteomyelitis or abscess.  The MRI scan does show bone-on-bone contact of the medial joint line with an extruded medial meniscus.   Treatments: Surgery, PICC line for IV antibiotics  Discharge Exam: General: NAD,  comfortable, husband at bedside Cardiac: regular rate Pulmonary: no increased work of breathing Left lower extremity: incision with nylon sutures in place. No active drainage noted. Extremely tender to palpation left knee. Only tolerates limited ROM. Able to SLR without assistance. No cellulitis noted to the lower leg. Swelling has improved significantly distally. Compartments soft and compressible. Sensation intact distally. intact EHL/TA/GSC. Warm well perfused foot Left upper extremity: TTP upper trap, deltoid - trigger point. Tolerates active forward elevation to about 90 degrees. Limited strength testing due to pain. NVI.  Disposition:  Discharge disposition: 03-Skilled Nursing Facility       Discharge Instructions    Anaphylaxis Kit: Provided to treat any anaphylactic reaction to the medication being provided to the patient if First Dose or when requested by physician   Complete by: As directed    Epinephrine 72m63ml vial / amp: Administer 0.3mg62m.3ml)49mbcutaneously once for moderate to severe anaphylaxis, nurse to call physician and pharmacy when reaction occurs and call 911 if needed for immediate care   Diphenhydramine 50mg/26mV vial: Administer 25-50mg I85m PRN for first dose reaction, rash, itching, mild reaction, nurse to call physician and pharmacy when reaction occurs   Sodium Chloride 0.9% NS 500ml IV75mminister if needed for hypovolemic blood pressure drop or as ordered by physician after call to physician with anaphylactic reaction   Call MD for:  redness, tenderness, or signs of infection (pain, swelling, redness, odor or green/yellow discharge around incision site)   Complete by: As directed    Call MD for:  severe uncontrolled pain   Complete by: As directed    Call MD for:  temperature >100.4   Complete by: As directed    Change dressing on IV access line weekly and PRN   Complete by: As directed    Diet - low sodium heart healthy   Complete by: As directed     Flush IV access with Sodium Chloride 0.9% and Heparin 10 units/ml or 100 units/ml   Complete by: As directed    Method of administration may be changed at the discretion of home infusion pharmacist based upon assessment of the patient and/or caregiver's ability to self-administer the medication ordered   Complete by: As directed      Allergies as of 09/20/2020   No Known Allergies     Medication List    STOP taking these medications   aspirin 81 MG chewable tablet Commonly known as: Aspirin Childrens   gabapentin 100 MG capsule Commonly known as: NEURONTIN Replaced by: gabapentin 800 MG tablet   methocarbamol 500 MG tablet Commonly known as: Robaxin   sulfamethoxazole-trimethoprim 800-160 MG tablet Commonly known as: BACTRIM DS     TAKE these medications   acetaminophen 500 MG tablet Commonly known as: TYLENOL Take 2 tablets (1,000 mg total) by mouth every 8 (eight) hours for 14 days.   ARIPiprazole 5 MG tablet Commonly known as: ABILIFY Take 1 tablet (5 mg total) by mouth daily.   cefTRIAXone  IVPB Commonly known as: ROCEPHIN Inject 2 g into the vein daily for 12 days. Indication:  Septic arthritis First Dose: No Last Day of Therapy:  10/02/20 Labs - Once weekly:  CBC/D and BMP, Labs - Every other week:  ESR and CRP Method of administration: IV Push Method of administration may be changed at the discretion of home infusion pharmacist based upon assessment of the patient and/or caregiver's ability to self-administer the medication ordered.   celecoxib 200 MG capsule Commonly known as: CeleBREX Take 1 capsule (200 mg total) by mouth 2 (two) times daily.   cyclobenzaprine 10 MG tablet Commonly known as: FLEXERIL Take 1 tablet (10 mg total) by mouth 3 (three) times daily as needed for muscle spasms.   enoxaparin 40 MG/0.4ML injection Commonly known as: LOVENOX Inject 0.4 mLs (40 mg total) into the skin daily.   gabapentin 800 MG tablet Commonly known as:  NEURONTIN Take 1 tablet (800 mg total) by mouth 3 (three) times daily. Replaces: gabapentin 100 MG capsule   liver oil-zinc oxide 40 % ointment Commonly known as: DESITIN Apply 1 application topically daily as needed for irritation.   omeprazole 20 MG capsule Commonly known as: PRILOSEC Take 1 capsule (20 mg total) by mouth daily. To gastric protection while taking NSAIDs   Oxycodone HCl 10 MG Tabs Take 1 pills every 6 hrs as needed for severe pain   vancomycin  IVPB Inject 1,500 mg into the vein every 12 (twelve) hours for 12 days. Indication:  Wound infection/abscess First Dose: No Last Day of Therapy:  10/02/20 Labs - Sunday/Monday:  CBC/D, BMP, and vancomycin trough. Labs - Thursday:  BMP and vancomycin trough Labs - Every other week:  ESR and CRP Method of administration:Elastomeric Method of administration may be changed at the discretion of the patient and/or caregiver's ability to self-administer the medication ordered.   VITAMIN D PO  Take 1 tablet by mouth daily.            Durable Medical Equipment  (From admission, onward)         Start     Ordered   09/19/20 1109  For home use only DME Continuous passive motion machine  Once        09/19/20 1108           Discharge Care Instructions  (From admission, onward)         Start     Ordered   09/20/20 0000  Change dressing on IV access line weekly and PRN  (Home infusion instructions - [facility] )        09/20/20 1043          Contact information for follow-up providers    Hiram Gash, MD On 10/01/2020.   Specialty: Orthopedic Surgery Why: _0  For wound re-check Contact information: [address] [address] [phone]            Contact information for after-discharge care    Destination    [facility] Preferred SNF .   Service: Skilled Nursing Contact information: [address] [place], PA-C 09/20/2020

## 2020-09-17 NOTE — Progress Notes (Signed)
   ORTHOPAEDIC PROGRESS NOTE  s/p Procedure(s): Irrigation and debridement of deep abscess- left leg, septic joint on 09/11/20 with Dr. Griffin Basil  SUBJECTIVE: Patient still having a lot of pain in the knee and around her incision site. Pain in the left upper arm as well. Per PT note she was able to tolerate increased ambulation today. However, she is very frustrated and feels no better. Pain keeps her from moving the knee and weight bearing.   No chest pain. No SOB. No nausea/vomiting. No other complaints.  OBJECTIVE: PE: General: NAD, sitting up in hospital bed Cardiac: regular rate Pulmonary: no increased work of breathing GI: abdomen soft, non-tender Left lower extremity: incision with nylon sutures in place. Some serous drainage noted at the distal aspect of the incision. (see media). Extremely tender to palpation left knee. Only tolerates limited ROM. Able to SLR without assistance. No cellulitis noted tot the lower leg. Swelling has improved significantly distally. Sensation intact distally. Warm well perfused foot Left upper extremity: TTP upper trap, AC joint, left upper arm.    Vitals:   09/16/20 2218 09/17/20 0530  BP: (!) 187/81 136/68  Pulse: 71 70  Resp: 18 17  Temp: 97.9 F (36.6 C) 98.1 F (36.7 C)  SpO2: 99% 100%   IMAGING:   -  MRI left knee shows full-thickness cartilage loss of medial femoral tibial compartment.  Maceration of the posterior horn of the medial meniscus.  ACL, PCL, MCL, LCL all intact. -  MRI left shoulder reveals mild to moderate tendinosis of supraspinatus and infraspinatus tendon.  Muscle edema noted in the supraspinatus muscle and musculotendinous junction, likely muscle strain with partial tear.  ASSESSMENT: [name] is a 65 y.o. female - s/p: Left knee incision and debridement of deep abscess, incision and debridement of septic knee joint  - POD # 6 - Left shoulder pain  PLAN: Weightbearing: WBAT LLE Insicional and dressing care:  Reinforce dressings as needed Dressing was removed. New bulky dressing appliced Orthopedic device(s): None Showering: Hold for now. Okay for sponge bath.  VTE prophylaxis: Lovenox  Pain control: PRN pain medications, preferring oral medications. IceMan.  Left superficial abscess and possible septic joint: ID has consulted. Recommend Vancomycin and Ceftriaxone. Continue to monitor cultures - currently showing no growth. PICC line has been placed. Uncontrollable knee pain: MRI of the knee obtained. No findings show why she is having continued pain. It is atypical that she would not have any relief after washout and IV antibiotics. Still unable to put full weight or bend the knee. Will request second opinion.  Left shoulder pain - cuff tendonitis, AC joint OA: Shoulder strain after injury. Continue conservative measures Elevated glucose: Glucose levels running consistently in the 110s. A1C 5.7. Defer to PCP once discharged Dispo: TBD. PT/OT recommending SNF. TOC following. Her husband is unable to help take care of her at home. Remains inpatient due to uncontrollable pain, discharge planning, IV antibiotics.   Contact information:  Dr. Ophelia Charter, Noemi Chapel PA-C, After hours and holidays please check [email or web address] for group call information for [facility]  Noemi Chapel, PA-C 09/17/2020

## 2020-09-17 NOTE — Progress Notes (Signed)
OT Cancellation Note  Patient Details Name: [name] MRN: [id number] DOB: 1955/04/28   Cancelled Treatment:    Reason Eval/Treat Not Completed: Patient declined, no reason specified;Pain limiting ability to participate: Pt pleasantly and apologetically declined OT due to recently completing physcal therapy and now just beginning lunch in bed. Pt declined getting up to chair for lunch due to LT knee pain. Will continue efforts.   Julien Girt 09/17/2020, 2:17 PM

## 2020-09-17 NOTE — Plan of Care (Signed)
?  Problem: Activity: ?Goal: Risk for activity intolerance will decrease ?Outcome: Progressing ?  ?Problem: Safety: ?Goal: Ability to remain free from injury will improve ?Outcome: Progressing ?  ?Problem: Pain Managment: ?Goal: General experience of comfort will improve ?Outcome: Progressing ?  ?

## 2020-09-18 LAB — VANCOMYCIN, TROUGH: Vancomycin Tr: 12 ug/mL — ABNORMAL LOW (ref 15–20)

## 2020-09-18 LAB — CREATININE, SERUM
Creatinine, Ser: 0.47 mg/dL (ref 0.44–1.00)
GFR, Estimated: >60 mL/min (ref >60)

## 2020-09-18 NOTE — Progress Notes (Signed)
Called attending MD to notified pt want to go outside with family for some air but no answer.Explained to  Patient needed an order to leave the unit. Patient stated just wanted to stroll the unit only.

## 2020-09-18 NOTE — Progress Notes (Signed)
ORTHOPAEDIC PROGRESS NOTE  s/p Procedure(s): Irrigation and debridement of deep abscess- left leg, septic joint on 09/11/20 with Dr. Griffin Basil  SUBJECTIVE:  Patient frustrated by the pain in her knee. Discouraged that she is not making progress. Did enjoy going outside with her friend and husband to get fresh air. Wants to get better and go to rehab.   No chest pain. No SOB. No nausea/vomiting. No abdominal pain. Tolerating diet. Voiding well. Having bowel movement. No other complaints.  OBJECTIVE: PE: General: NAD, comfortable, sitting up in hospital bed Cardiac: regular rate Pulmonary: no increased work of breathing Left lower extremity: dressing CDI. Extremely tender to palpation left knee. Only tolerates limited ROM. Able to SLR without assistance. No cellulitis noted tot the lower leg. Swelling has improved significantly distally. Compartments soft and compressible. Sensation intact distally. intact EHL/TA/GSC. Warm well perfused foot Left upper extremity: TTP upper trap, deltoid - trigger point. Tolerates active forward elevation to about 90 degrees. Limited strength testing due to pain. NVI.   Vitals:   09/18/20 0629 09/18/20 1403  BP: (!) 146/97 (!) 153/70  Pulse: 63 67  Resp: 19 18  Temp: 98 F (36.7 C) 98.1 F (36.7 C)  SpO2: 100% 96%   IMAGING:   -  MRI left knee shows full-thickness cartilage loss of medial femoral tibial compartment.  Maceration of the posterior horn of the medial meniscus.  ACL, PCL, MCL, LCL all intact. -  MRI left shoulder reveals mild to moderate tendinosis of supraspinatus and infraspinatus tendon.  Muscle edema noted in the supraspinatus muscle and musculotendinous junction, likely muscle strain  ASSESSMENT: [name] is a 65 y.o. female - s/p: Left knee incision and debridement of deep abscess, incision and debridement of septic knee joint  - POD # 6 - Left cuff tendonitis, muscle strain  PLAN: Weightbearing: WBAT LLE, WBAT  LUE Insicional and dressing care: Reinforce dressings as needed New dressing to be applied tomorrow Orthopedic device(s): CPM ordered  Showering: Hold for now. Okay for sponge bath.  VTE prophylaxis: Lovenox  Pain control: PRN pain medications, preferring oral medications. IceMan. May require pain management post-op.  Left superficial abscess and possible septic joint: ID has consulted. Recommend Vancomycin and Ceftriaxone. Continue to monitor cultures - currently showing no growth. PICC line has been placed. Uncontrollable knee pain: MRI of the knee obtained. No findings show why she is having continued pain. It is atypical that she would not have any relief after washout and IV antibiotics. Still unable to put full weight or bend the knee. Will request second opinion. Patient does have significant post-traumatic arthritis of the left knee. Encouraged ROM as she is at a extremely high risk for stiffness, which will led to continued pain. CPM ordered.  Left shoulder pain - cuff tendonitis, AC joint OA: Continue conservative measures. PT/OT - ROM as tolerated, WBAT, no restrictions Elevated glucose: Glucose levels running consistently in the 110s. A1C 5.7. Defer to PCP once discharged HTN: will need outpatient follow-up with her PCP regarding blood pressure.  - Discussed referral for the patient to talk to someone regarding her emotional state. She is very discouraged at this time. Patient declined. She enjoyed going outside with friend and husband today to get fresh air. This improved her mood. Okay to leave the floor to go outside with friend and/or family member.  Dispo: TBD. PT/OT recommending SNF. TOC following. Her husband is unable to help take care of her at home. Remains inpatient due to uncontrollable pain, discharge  planning, IV antibiotics. Will get a second opinion about the left knee. Likely discharge to SNF on Friday.   Contact information:  Dr. Ophelia Charter, Noemi Chapel PA-C, After  hours and holidays please check [email or web address] for group call information for [facility]  Noemi Chapel, PA-C 09/18/2020

## 2020-09-18 NOTE — Progress Notes (Signed)
Orthopedic Tech Progress Note Patient Details:  [name] January 26, 1956 [id number]  CPM Left Knee CPM Left Knee: On Left Knee Flexion (Degrees): 20 Left Knee Extension (Degrees): 0  Post Interventions Patient Tolerated: Well  [name] 09/18/2020, 8:58 PM

## 2020-09-18 NOTE — Progress Notes (Signed)
Occupational Therapy Treatment Patient Details Name: [name] MRN: [id number] DOB: 1956-01-20 Today's Date: 09/18/2020    History of present illness Patient had a left knee surgery with proximal tibial hardware removal, tibial incision debridement of deep abscess., left knee scope with medial femoral chondroplasty, lysis of adhesions and manipulation under anesthesia on 08/28/2020. There was concern about infection of the hardware as there was an area of purulent material. She is now s/p Left knee incision and debridement of deep abscess on 09/11/20,  Left knee septic joint incision and debridement on 5/25 with hemovac and wound vac.  Other PMH includes depression, arthritis, anxiety.  MRI left knee shows full-thickness cartilage loss of medial femoral tibial compartment.  Maceration of the posterior horn of the medial meniscus.  ACL, PCL, MCL, LCL all intact.  -MRI left shoulder reveals mild to moderate tendinosis of supraspinatus and infraspinatus tendon.  Muscle edema noted in the supraspinatus muscle and musculotendinous junction, likely muscle strain with partial tear.   OT comments  Recent MRI finding of left shoulder reveals tendinosis of supraspinatus and infraspinatus tendons. On assessment patient reporting pain approx at the spine of the scapula and in the deltoid into the bicep area at rest rating 2/10 but with any active or passive movement patient reporting sharp pain and wincing. Therapist instructed patient to limit left shoulder movement - including reaching overhead and pulling herself up in bed. Patient able to position herself in side lying and tolerate LLE pain to attempt to perform gentle rotator cuff exercises. Patient unable to tolerate side lying external rotator or partial abduction. Returned to supine. Patient reports pain in full external rotation. Therapist recommending frequent cryotherapy using knee cuff and predominant rest - only approx 20 degrees external rotation from  neutral and gentle mini shoulder flexion (approx 20-30 degrees) to maintain glenohumeral mobility. Educating patient to stop movement before sharp pain. Patient verbalized understanding.    Follow Up Recommendations  SNF    Equipment Recommendations  None recommended by OT    Recommendations for Other Services      Precautions / Restrictions Precautions Precautions: Fall Precaution Comments: Recent fall at home per ortho note Restrictions Weight Bearing Restrictions: No LLE Weight Bearing: Weight bearing as tolerated       Mobility Bed Mobility                    Transfers                      Balance                                           ADL either performed or assessed with clinical judgement   ADL                                               Vision Patient Visual Report: No change from baseline     Perception     Praxis      Cognition Arousal/Alertness: Awake/alert Behavior During Therapy: WFL for tasks assessed/performed Overall Cognitive Status: Within Functional Limits for tasks assessed  Exercises Other Exercises Other Exercises: gentle active shoulder external rotation reps to 30 deg, should flexion to approx 20 degrees   Shoulder Instructions       General Comments      Pertinent Vitals/ Pain       Pain Assessment: Faces Faces Pain Scale: Hurts whole lot Pain Location: L shoulder into left bicep, LLE (with movement) Pain Descriptors / Indicators: Grimacing;Guarding;Sharp;Tender;Aching Pain Intervention(s): Limited activity within patient's tolerance;Monitored during session;Repositioned  Home Living                                          Prior Functioning/Environment              Frequency  Min 2X/week        Progress Toward Goals  OT Goals(current goals can now be found in the care plan  section)  Progress towards OT goals: Progressing toward goals  Acute Rehab OT Goals Patient Stated Goal: To walk without pain OT Goal Formulation: With patient Time For Goal Achievement: 09/26/20 Potential to Achieve Goals: Good  Plan Discharge plan remains appropriate    Co-evaluation                 AM-PAC OT "6 Clicks" Daily Activity     Outcome Measure   Help from another person eating meals?: None Help from another person taking care of personal grooming?: None Help from another person toileting, which includes using toliet, bedpan, or urinal?: A Lot Help from another person bathing (including washing, rinsing, drying)?: A Little Help from another person to put on and taking off regular upper body clothing?: A Little Help from another person to put on and taking off regular lower body clothing?: A Lot 6 Click Score: 18    End of Session    OT Visit Diagnosis: Unsteadiness on feet (R26.81);Other abnormalities of gait and mobility (R26.89);Pain   Activity Tolerance Patient limited by pain   Patient Left in bed;with call bell/phone within reach   Nurse Communication  (limit L shoulder use)        Time: 1610-9604 OT Time Calculation (min): 23 min  Charges: OT General Charges $OT Visit: 1 Visit OT Treatments $Therapeutic Exercise: 23-37 mins  Najae Rathert, OTR/L [place]  Office [phone] Pager: [place] 09/18/2020, 10:31 AM

## 2020-09-18 NOTE — Progress Notes (Signed)
Patient left floor with family down stair without let staff know. Staff went down, pt and family member were down stair eating lunch together. Will be back after finish lunch. Notified MD's office ( will message MD).

## 2020-09-18 NOTE — Progress Notes (Signed)
Pharmacy Antibiotic Note  [name] is a 65 y.o. female admitted on 09/11/2020 with knee abscess.  Pharmacy has been consulted for vancomycin dosing.  PMH significant for left tibial fracture requiring ORIF in November 2021. Returned to OR on 08/28/20 for hardware removal, debridement of abscess - cultures were negative. Pt underwent  I&D on 5/25, ID consulted. Vancomycin being started for wound infection, concern for infected joint.  Tobramycin + vancomycin powder given topically in OR.   Today, 09/18/20  Day 8 of IV antibiotics.   WBC remains WNL  Remains afebrile  SCr low/stable  Calculated vancomycin AUC = 536 is within goal on current regimen of 1500 mg IV q12h  Vancomycin peak 5/31 @ 2219 = 28 mcg/mL  VT on 6/1 @ 0542 = 12 mcg/mL  AUC = 536. Ke 0.1148, half-life: 6 hrs  Plan:  Continue vancomycin 1500 mg IV q12h  Continue Rocephin 2g IV q24 hr  OPAT orders completed, continue IV antibiotics through 10/02/20  Hereafter, Pharmacy will only leave notes as required by dose adjustments or other notable changes  Height: 5\' 8"  (172.7 cm) Weight: (!) 149.7 kg (330 lb) IBW/kg (Calculated) : 63.9  Temp (24hrs), Avg:97.9 F (36.6 C), Min:97.8 F (36.6 C), Max:98 F (36.7 C)  Recent Labs  Lab 09/13/20 0325 09/13/20 1250 09/13/20 2217 09/14/20 0503 09/15/20 0300 09/16/20 0320 09/17/20 0522 09/17/20 2219 09/18/20 0542  WBC 8.2  --   --  7.1 6.8 5.3 7.3  --   --   CREATININE 0.47   < >  --  0.37* 0.58 0.38* 0.47  --  0.47  VANCOTROUGH  --   --   --  10*  --   --   --   --  12*  VANCOPEAK  --   --  21*  --   --   --   --  28*  --    < > = values in this interval not displayed.    Estimated Creatinine Clearance: 110.1 mL/min (by C-G formula based on SCr of 0.47 mg/dL).    No Known Allergies  Antimicrobials this admission: vancomycin 5/25 >>  Ceftriaxone 5/25 >>   Dose adjustments this admission: 5/28 increase vanc 1250 >> 1500 q12 based on low AUC  Thank you  for allowing pharmacy to be a part of this patient's care.  Lenis Noon, PharmD, BCPS 09/18/2020 10:59 AM

## 2020-09-18 NOTE — Plan of Care (Signed)
  Problem: Safety: Goal: Ability to remain free from injury will improve Outcome: Progressing   Problem: Pain Managment: Goal: General experience of comfort will improve Outcome: Progressing   Problem: Coping: Goal: Level of anxiety will decrease Outcome: Progressing   

## 2020-09-18 NOTE — TOC Progression Note (Signed)
Transition of Care [facility]) - Progression Note   Patient Details  Name: Tanisha Lutes MRN: [id number] Date of Birth: 1955/10/29  Transition of Care [facility]) CM/SW [place], [place] Phone Number: 09/18/2020, 2:12 PM  Clinical Narrative: Patient awaiting consult regarding knee pain. CSW spoke with Claiborne Billings at [facility] and the bed will be given up if patient is not able to be discharged today. Next available bed at [facility] will be 09/20/20. CSW updated ortho PA and patient. Insurance authorization for SNF is good through next week. TOC to follow.  Expected Discharge Plan: [place] Barriers to Discharge: Continued Medical Work up,SNF Pending bed offer  Expected Discharge Plan and Services Expected Discharge Plan: [place] In-house Referral: Clinical Social Work Post Acute Care Choice: [place] Living arrangements for the past 2 months: Single Family Home              DME Arranged: N/A DME Agency: NA  Readmission Risk Interventions Readmission Risk Prevention Plan 02/27/2020  Post Dischage Appt Complete  Medication Screening Complete  Transportation Screening Complete  Some recent data might be hidden

## 2020-09-18 NOTE — Progress Notes (Signed)
Physical Therapy Treatment Patient Details Name: [name] MRN: [id number] DOB: 04/18/1956 Today's Date: 09/18/2020    History of Present Illness Patient had a left knee surgery with proximal tibial hardware removal, tibial incision debridement of deep abscess., left knee scope with medial femoral chondroplasty, lysis of adhesions and manipulation under anesthesia on 08/28/2020. There was concern about infection of the hardware as there was an area of purulent material. She is now s/p Left knee incision and debridement of deep abscess on 09/11/20,  Left knee septic joint incision and debridement on 5/25 with hemovac and wound vac.  Other PMH includes depression, arthritis, anxiety.  MRI left knee shows full-thickness cartilage loss of medial femoral tibial compartment.  Maceration of the posterior horn of the medial meniscus.  ACL, PCL, MCL, LCL all intact.  -MRI left shoulder reveals mild to moderate tendinosis of supraspinatus and infraspinatus tendon.  Muscle edema noted in the supraspinatus muscle and musculotendinous junction, likely muscle strain with partial tear.    PT Comments    Pt ambulated 100' with RW with frequent standing rest breaks 2* LLE and L shoulder pain. She declined LLE exercises 2* pain but put forth good effort with mobility.   Follow Up Recommendations  SNF (vs HHPT pending progress)     Equipment Recommendations  None recommended by PT    Recommendations for Other Services       Precautions / Restrictions Precautions Precautions: Fall Precaution Comments: Recent fall at home per ortho note Restrictions Weight Bearing Restrictions: No LLE Weight Bearing: Weight bearing as tolerated    Mobility  Bed Mobility Overal bed mobility: Modified Independent Bed Mobility: Sit to Supine       Sit to supine: Modified independent (Device/Increase time)   General bed mobility comments: used rail, used momentum to lift LLE into bed    Transfers Overall transfer  level: Needs assistance Equipment used: Rolling walker (2 wheeled) Transfers: Sit to/from Stand Sit to Stand: Supervision         General transfer comment: for safety on rising and transition to  RW  Ambulation/Gait Ambulation/Gait assistance: Min guard Gait Distance (Feet): 100 Feet Assistive device: Rolling walker (2 wheeled) Gait Pattern/deviations: Step-to pattern;Decreased stance time - left;Decreased weight shift to left Gait velocity: decreased   General Gait Details: cues for sequence, distance to tolerance; min/guard for safety with turns   Chief Strategy Officer    Modified Rankin (Stroke Patients Only)       Balance   Sitting-balance support: Feet supported Sitting balance-Leahy Scale: Good     Standing balance support: No upper extremity supported Standing balance-Leahy Scale: Fair Standing balance comment: RW for amb, able to stand and perform her own peri-care                            Cognition Arousal/Alertness: Awake/alert Behavior During Therapy: WFL for tasks assessed/performed Overall Cognitive Status: Within Functional Limits for tasks assessed                                        Exercises Other Exercises Other Exercises: gentle active shoulder external rotation reps to 30 deg, should flexion to approx 20 degrees    General Comments        Pertinent Vitals/Pain Pain Assessment: Faces Pain Score: 8  Faces Pain Scale: Hurts whole lot Pain Location: L shoulder into left bicep, LLE (with movement) Pain Descriptors / Indicators: Grimacing;Guarding;Sharp;Tender;Aching Pain Intervention(s): Limited activity within patient's tolerance;Monitored during session;Premedicated before session;Ice applied    Home Living                      Prior Function            PT Goals (current goals can now be found in the care plan section) Acute Rehab PT Goals Patient Stated Goal: To  walk without pain PT Goal Formulation: With patient Time For Goal Achievement: 09/26/20 Potential to Achieve Goals: Good Progress towards PT goals: Progressing toward goals    Frequency    Min 5X/week      PT Plan Current plan remains appropriate    Co-evaluation              AM-PAC PT "6 Clicks" Mobility   Outcome Measure  Help needed turning from your back to your side while in a flat bed without using bedrails?: A Little Help needed moving from lying on your back to sitting on the side of a flat bed without using bedrails?: None Help needed moving to and from a bed to a chair (including a wheelchair)?: A Little Help needed standing up from a chair using your arms (e.g., wheelchair or bedside chair)?: A Little Help needed to walk in hospital room?: A Little Help needed climbing 3-5 steps with a railing? : A Lot 6 Click Score: 18    End of Session Equipment Utilized During Treatment: Gait belt Activity Tolerance: Patient tolerated treatment well Patient left: in bed;with call bell/phone within reach;with bed alarm set Nurse Communication: Mobility status PT Visit Diagnosis: Unsteadiness on feet (R26.81);Pain;Muscle weakness (generalized) (M62.81) Pain - Right/Left: Left Pain - part of body: Knee     Time: 1025-8527 PT Time Calculation (min) (ACUTE ONLY): 39 min  Charges:  $Gait Training: 23-37 mins $Therapeutic Activity: 8-22 mins                     Blondell Reveal Kistler PT 09/18/2020  Acute Rehabilitation Services Pager [phone] Office [phone]

## 2020-09-18 NOTE — Plan of Care (Signed)
?  Problem: Activity: ?Goal: Risk for activity intolerance will decrease ?Outcome: Progressing ?  ?Problem: Safety: ?Goal: Ability to remain free from injury will improve ?Outcome: Progressing ?  ?Problem: Pain Managment: ?Goal: General experience of comfort will improve ?Outcome: Progressing ?  ?

## 2020-09-19 ENCOUNTER — Ambulatory Visit: Payer: BC Managed Care – PPO | Admitting: Physical Therapy

## 2020-09-19 DIAGNOSIS — M009 Pyogenic arthritis, unspecified: Principal | ICD-10-CM

## 2020-09-19 DIAGNOSIS — F418 Other specified anxiety disorders: Secondary | ICD-10-CM

## 2020-09-19 LAB — CREATININE, SERUM
Creatinine, Ser: 0.48 mg/dL (ref 0.44–1.00)
GFR, Estimated: >60 mL/min (ref >60)

## 2020-09-19 LAB — GLUCOSE, CAPILLARY: Glucose-Capillary: 128 mg/dL — ABNORMAL HIGH (ref 70–99)

## 2020-09-19 LAB — SARS CORONAVIRUS 2 (TAT 6-24 HRS): SARS Coronavirus 2: NEGATIVE

## 2020-09-19 MED ORDER — LORAZEPAM 1 MG PO TABS
1.0000 mg | ORAL_TABLET | Freq: Once | ORAL | Status: AC | PRN
  Administered 2020-09-19: 1 mg via ORAL
  Filled 2020-09-19: qty 1

## 2020-09-19 MED ORDER — METHOCARBAMOL 500 MG IVPB - SIMPLE MED
500.0000 mg | Freq: Four times a day (QID) | INTRAVENOUS | Status: DC | PRN
  Filled 2020-09-19: qty 50

## 2020-09-19 MED ORDER — ARIPIPRAZOLE 2 MG PO TABS
2.0000 mg | ORAL_TABLET | Freq: Once | ORAL | Status: AC
  Administered 2020-09-19: 2 mg via ORAL
  Filled 2020-09-19 (×2): qty 1

## 2020-09-19 MED ORDER — OXYCODONE HCL 5 MG PO TABS: 10.0000 mg | ORAL_TABLET | Freq: Four times a day (QID) | ORAL | Status: DC | PRN

## 2020-09-19 MED ORDER — ENOXAPARIN SODIUM 40 MG/0.4ML IJ SOSY: 40.0000 mg | PREFILLED_SYRINGE | INTRAMUSCULAR | 0 refills | Status: DC

## 2020-09-19 MED ORDER — ARIPIPRAZOLE 5 MG PO TABS
5.0000 mg | ORAL_TABLET | Freq: Every day | ORAL | Status: DC
  Administered 2020-09-20: 5 mg via ORAL
  Filled 2020-09-19: qty 1

## 2020-09-19 MED ORDER — OXYCODONE HCL 5 MG PO TABS
10.0000 mg | ORAL_TABLET | ORAL | Status: DC | PRN
Start: 2020-09-19 — End: 2020-09-20
  Administered 2020-09-19 – 2020-09-20 (×4): 15 mg via ORAL
  Filled 2020-09-19 (×4): qty 3

## 2020-09-19 MED ORDER — OXYCODONE HCL 10 MG PO TABS: ORAL_TABLET | ORAL | 0 refills | Status: DC

## 2020-09-19 MED ORDER — METHOCARBAMOL 500 MG PO TABS
750.0000 mg | ORAL_TABLET | Freq: Four times a day (QID) | ORAL | Status: DC | PRN
  Administered 2020-09-19 – 2020-09-20 (×2): 750 mg via ORAL
  Filled 2020-09-19 (×2): qty 2

## 2020-09-19 MED ORDER — OXYCODONE HCL 5 MG PO TABS
5.0000 mg | ORAL_TABLET | ORAL | Status: DC | PRN
Start: 2020-09-19 — End: 2020-09-20

## 2020-09-19 MED ORDER — CYCLOBENZAPRINE HCL 10 MG PO TABS: 10.0000 mg | ORAL_TABLET | Freq: Three times a day (TID) | ORAL | 0 refills | Status: AC | PRN

## 2020-09-19 MED ORDER — OXYCODONE HCL 5 MG PO TABS
5.0000 mg | ORAL_TABLET | Freq: Four times a day (QID) | ORAL | Status: DC | PRN
Start: 2020-09-19 — End: 2020-09-19
  Administered 2020-09-19: 5 mg via ORAL
  Filled 2020-09-19: qty 2

## 2020-09-19 NOTE — Progress Notes (Signed)
COVID test collected and sent.

## 2020-09-19 NOTE — Plan of Care (Signed)
  Problem: Education: Goal: Knowledge of General Education information will improve Description: Including pain rating scale, medication(s)/side effects and non-pharmacologic comfort measures Outcome: Progressing   Problem: Pain Managment: Goal: General experience of comfort will improve Outcome: Progressing   

## 2020-09-19 NOTE — Progress Notes (Signed)
PA responded with new order.

## 2020-09-19 NOTE — Progress Notes (Signed)
Notified attending MD office, will send msg to MD/PA. Await on response.

## 2020-09-19 NOTE — Progress Notes (Addendum)
Physical Therapy Treatment Patient Details Name: [name] MRN: [id number] DOB: 11-27-1955 Today's Date: 09/19/2020    History of Present Illness Patient had a left knee surgery with proximal tibial hardware removal, tibial incision debridement of deep abscess., left knee scope with medial femoral chondroplasty, lysis of adhesions and manipulation under anesthesia on 08/28/2020. There was concern about infection of the hardware as there was an area of purulent material. She is now s/p Left knee incision and debridement of deep abscess on 09/11/20,  Left knee septic joint incision and debridement on 5/25 with hemovac and wound vac.  Other PMH includes depression, arthritis, anxiety.  MRI left knee shows full-thickness cartilage loss of medial femoral tibial compartment.  Maceration of the posterior horn of the medial meniscus.  ACL, PCL, MCL, LCL all intact.  -MRI left shoulder reveals mild to moderate tendinosis of supraspinatus and infraspinatus tendon.  Muscle edema noted in the supraspinatus muscle and musculotendinous junction, likely muscle strain with partial tear.    PT Comments    Pt teary throughout session, feels like she doesn't want to continue "living like this". Pt stated she would like to talk to a chaplain. Chaplain consult placed. Pt's incision was had no dressing at start of PT session, noted drainage at distal end of incision. RN covered incision with dry dressing. Noted skin tear R lower buttock, RN placed dressing on this as well. She ambulated 63' with RW, distance limited by L knee and L shoulder pain. Pt puts forth good effort. LLE strengthening exercises performed at bed level.    Follow Up Recommendations  SNF (vs HHPT pending progress)     Equipment Recommendations  None recommended by PT    Recommendations for Other Services       Precautions / Restrictions Precautions Precautions: Fall Precaution Comments: Recent fall at home per ortho note Restrictions Weight  Bearing Restrictions: No LLE Weight Bearing: Weight bearing as tolerated    Mobility  Bed Mobility Overal bed mobility: Modified Independent Bed Mobility: Supine to Sit     Supine to sit: Modified independent (Device/Increase time);HOB elevated     General bed mobility comments: used rail    Transfers Overall transfer level: Needs assistance Equipment used: Rolling walker (2 wheeled) Transfers: Sit to/from Stand Sit to Stand: Supervision         General transfer comment: for safety on rising and transition to RW  Ambulation/Gait Ambulation/Gait assistance: Min Gaffer (Feet): 55 Feet Assistive device: Rolling walker (2 wheeled) Gait Pattern/deviations: Step-to pattern;Decreased stance time - left;Decreased weight shift to left Gait velocity: decreased   General Gait Details: cues for sequence, distance limited by pain; min/guard for safety with turns   Stairs             Wheelchair Mobility    Modified Rankin (Stroke Patients Only)       Balance Overall balance assessment: Needs assistance;History of Falls Sitting-balance support: Feet supported Sitting balance-Leahy Scale: Good     Standing balance support: Single extremity supported Standing balance-Leahy Scale: Fair Standing balance comment: RW for amb, able to stand and perform her own peri-care                            Cognition Arousal/Alertness: Awake/alert Behavior During Therapy: WFL for tasks assessed/performed Overall Cognitive Status: Within Functional Limits for tasks assessed  Exercises General Exercises - Lower Extremity Ankle Circles/Pumps: AROM;Both;10 reps;Supine Quad Sets: AROM;Both;5 reps;Supine Short Arc Quad: AROM;Left;5 reps;Seated Straight Leg Raises: AROM;Left;5 reps    General Comments        Pertinent Vitals/Pain Pain Score: 7  Pain Location: L knee Pain Descriptors /  Indicators: Grimacing;Guarding;Sharp;Tender;Aching Pain Intervention(s): Limited activity within patient's tolerance;Monitored during session;Premedicated before session;Ice applied    Home Living                      Prior Function            PT Goals (current goals can now be found in the care plan section) Acute Rehab PT Goals Patient Stated Goal: To walk without pain PT Goal Formulation: With patient Time For Goal Achievement: 09/26/20 Potential to Achieve Goals: Good Progress towards PT goals: Progressing toward goals    Frequency    Min 5X/week      PT Plan Current plan remains appropriate    Co-evaluation              AM-PAC PT "6 Clicks" Mobility   Outcome Measure  Help needed turning from your back to your side while in a flat bed without using bedrails?: A Little Help needed moving from lying on your back to sitting on the side of a flat bed without using bedrails?: None Help needed moving to and from a bed to a chair (including a wheelchair)?: A Little Help needed standing up from a chair using your arms (e.g., wheelchair or bedside chair)?: A Little Help needed to walk in hospital room?: A Little Help needed climbing 3-5 steps with a railing? : A Lot 6 Click Score: 18    End of Session Equipment Utilized During Treatment: Gait belt Activity Tolerance: Patient limited by pain;Patient limited by fatigue Patient left: with call bell/phone within reach;in chair;with chair alarm set Nurse Communication: Mobility status PT Visit Diagnosis: Unsteadiness on feet (R26.81);Pain;Muscle weakness (generalized) (M62.81) Pain - Right/Left: Left Pain - part of body: Knee     Time: 9166-0600 PT Time Calculation (min) (ACUTE ONLY): 45 min  Charges:  $Gait Training: 8-22 mins $Therapeutic Exercise: 8-22 mins $Therapeutic Activity: 8-22 mins                    Blondell Reveal Kistler PT 09/19/2020  Acute Rehabilitation Services Pager  [phone] Office [phone]

## 2020-09-19 NOTE — TOC Progression Note (Signed)
Transition of Care [facility]) - Progression Note   Patient Details  Name: Corazon Nickolas MRN: [id number] Date of Birth: Jun 03, 1955  Transition of Care [place] [facility]) CM/SW [place], LCSW Phone Number: 09/19/2020, 2:10 PM  Clinical Narrative: CSW spoke with Claiborne Billings at [facility] and confirmed patient's bed will be available tomorrow. Patient will discharge with her DME (ice machine, CPM) and patient is still requesting to be transported to the facility by her husband rather than ambulance. [place] aware. New COVID test has been ordered. TOC to follow.  Expected Discharge Plan: [place] Barriers to Discharge: Continued Medical Work up,SNF Pending bed offer  Expected Discharge Plan and Services Expected Discharge Plan: [place] In-house Referral: Clinical Social Work Post Acute Care Choice: [place] Living arrangements for the past 2 months: Single Family Home            DME Arranged: N/A DME Agency: NA  Readmission Risk Interventions Readmission Risk Prevention Plan 02/27/2020  Post Dischage Appt Complete  Medication Screening Complete  Transportation Screening Complete  Some recent data might be hidden

## 2020-09-19 NOTE — BH Assessment (Signed)
Requested nursing [facility], RN), to place TTS machine in patient's room.

## 2020-09-19 NOTE — Progress Notes (Signed)
ORTHOPAEDIC PROGRESS NOTE  s/p Procedure(s): Irrigation and debridement of deep abscess- left leg, septic joint on 09/11/20 with Dr. Griffin Basil  SUBJECTIVE:  Patient frustrated by the pain in her knee. Wants to get better and is motivated to get better. She was discouraged and hopeless earlier today. Told nursing staff she "does not want to be here" if she is going to be a burden. Psych consulted was requested. Mood improved after speaking to psychiatry and especially after her talk with the chaplain. She enjoyed that conversation.   No chest pain. No SOB. No nausea/vomiting. No abdominal pain. Tolerating diet. Voiding well. Having bowel movement. No other complaints.  OBJECTIVE: PE: General: NAD, comfortable, husband at bedside Cardiac: regular rate Pulmonary: no increased work of breathing Left lower extremity: incision with nylon sutures in place. No active drainage noted. Extremely tender to palpation left knee. Only tolerates limited ROM. Able to SLR without assistance. No cellulitis noted to the lower leg. Swelling has improved significantly distally. Compartments soft and compressible. Sensation intact distally. intact EHL/TA/GSC. Warm well perfused foot Left upper extremity: TTP upper trap, deltoid - trigger point. Tolerates active forward elevation to about 90 degrees. Limited strength testing due to pain. NVI.   Vitals:   09/18/20 2141 09/19/20 0547  BP: 136/75 (!) 145/68  Pulse: 73 66  Resp: 16 16  Temp: (!) 97.4 F (36.3 C) 98.1 F (36.7 C)  SpO2: 98% 96%   IMAGING:   -  MRI left knee shows full-thickness cartilage loss of medial femoral tibial compartment.  Maceration of the posterior horn of the medial meniscus.  ACL, PCL, MCL, LCL all intact. -  MRI left shoulder reveals mild to moderate tendinosis of supraspinatus and infraspinatus tendon.  Muscle edema noted in the supraspinatus muscle and musculotendinous junction, likely muscle strain  ASSESSMENT: [name] is a  65 y.o. female - s/p: Left knee incision and debridement of deep abscess, incision and debridement of septic knee joint  - POD # 8 - Continued left knee pain after above with severe medial compartment arthritis - Left rotator cuff tendonitis, muscle strain  PLAN: Weightbearing: WBAT LLE, WBAT LUE Insicional and dressing care: Reinforce dressings as needed New dressing placed today Orthopedic device(s): CPM  Showering: Hold for now. Okay for sponge bath.  VTE prophylaxis: Lovenox  Pain control: PRN pain medications, preferring oral medications. IceMan. Pain management referral placed for discharge Left superficial abscess and possible septic joint: ID has consulted. Recommend Vancomycin and Ceftriaxone. Continue to monitor cultures - currently showing no growth. PICC line has been placed. 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 Encouraged ROM as she is at a extremely high risk for stiffness, which will led to continued pain. Continue CPM. Appreciate Dr. Jess Barters consultation.  Left shoulder pain - cuff tendonitis, AC joint OA: Continue conservative measures. PT/OT - ROM as tolerated, WBAT, no restrictions Elevated glucose: Glucose levels running consistently in the 110s. A1C 5.7. Defer to PCP once discharged HTN: will need outpatient follow-up with her PCP regarding blood pressure.  Suicidal ideations: Appreciate psychiatry and chaplain's consultation.   Per psychiatry note, "-Start Abilify 2mg  po today. Then increase Abilify 5mg  po daily for management of depression, suicidal thoughts. Will obtain EKG at this time. New orders placed.  -TOC referral for outpatient behavioral health services, she will benefit from Intensive Outpatient Programming (IOP) for behavioral therapy and coping skills. Most programs are virtual and can take place while receiving skilled nursing services.  Discussed with TOC Jinny Blossom) regarding referral and or  resources, and was going to follow up after speaking with supervisor.  -Place referral to [facility] IOP."  Dispo: Discharge to SNF tomorrow. Discharge medications for pain and DVT prophylaxis printed and placed in patient's chart. Home CPM ordered because [facility] does not have one at their facility. Discussed the plan for discharge to rehab tomorrow with the patient and her husband. Patient and her husband are in agreement with plan for discharge home tomorrow.   Contact information:  Dr. Ophelia Charter, Noemi Chapel PA-C, After hours and holidays please check [email or web address] for group call information for [facility]  Noemi Chapel, PA-C 09/19/2020

## 2020-09-19 NOTE — Consult Note (Signed)
[facility] Consult   Reason for Consult: Patient states she feels hopeless. Told nursing staff she "does not want to be here" if she is going to be a burden. History of anxiety and depression - not currently being treated  Referring Physician:  Noemi Chapel Patient Identification: [name] MRN:  [id number] Principal Diagnosis: <principal problem not specified> Diagnosis:  Active Problems:   Cellulitis and abscess of lower extremity   Septic arthritis of knee, left ([place])   Total Time spent with patient: 30 minutes  Subjective:   [name] is a 65 y.o. female patient admitted for arthroscopy of left knee. Patients medical history has been complicated by morbid obesity, site infections, depression  And severe arthritis. Patient is seen and assessed in depth in which she is observed talking with chaplain. Upon entry of the room and introduction of self she says " Oh first the chaplain and now the shrink. All because I am suicidal. That doesn't mean Im going to do anything. Look at me I cant walk in front of a bus, Im too slow. I cant stand up to tie a noose around my neck. I cant overdose as my husband will have control of the medications. I do not own any guns. I guess you consider this a cry for help. I knew if I said something and expressed how I felt I would get the help I needed. Im not crazy and then developed pain, its the pain that makes me crazy. I am willing to try rehab as there is still hope that I can recover and heal. I may not be myself and jump off sail boats any more but if I could walk from my car to a girlfriends house I would be happy. Patient endorses depressive symptoms that have been ongoing for approximately three years. She endorses recurrent thoughts of death, hopelessness at times, sadness, anxiety all of which are exacerbated by her chronic knee pain. She denies any previous psychiatric history at this time, with the exception of adjustment disorder when  seperating from her spouse. She states her current spouse is a narcissist, and that has caused turmoil and she required medication and brief therapy session to cope with it. "As you can see it is 2pm and I haven't heard from my husband while his wife is in the hospital. See what I mean. He is a Programmer, systems. " Patient denies any substance abuse. She denies any previous suicide attempts, thoughts or ideations, or self harming behaviors. She denies any family history of mental illness. Yet when assessing for family history of suicidal ideation she states "my son tried to take his life when he was 10-11 around the time my husband left [place]. " In the same sentence states that is one reason I will not harm myself because my son would be hurt. She currently denies active suicidal ideations and suicidal thoughts. She is able to contract for safety, with emphasis on " I told someone how I was feeling and knew I would get the help I needed. This is a start. So yes I can tell someone if I am going to hurt myself I already did.   Clinical evaluation shows that patient does have appropriate insight and judgement into her condition and is able to rationalize and conceptualize. She further states that going inpatient would only "prolong or delay me from recovering and getting the help I need. She states her depressive symptoms and intermittent suicidal thoughts are primarily due to her chronic  pain.  "I can start the medication and get therapy after therapy if it is still needed. I have been accepted to a facility and this is the first step to my recovery and glimpse of hope. Admitting me inpatient is not going to help me. " She is very circumstantial in her thought processes and very talkative. At one time she calls this a cry for help, and later refers to "[facility] and never get out. It wouldn't help me. It would only make things worse. " On evaluation patient is alert and oriented, calm and cooperative, very  pleasant upon approach.  She is married and lives at home with her husband, she previously worked at [employer] remote for [employer], however due to her pain she is no longer able to work. "That was my one happy place and I really liked what I was doing. Helping the kids. " Patient denies any access to weapons, denies any alcohol and or substance abuse.  She reports moderate sleep and fair appetite.   Patient denies any auditory and/or visual hallucinations, does not appear to be responding to internal or external stimuli.  There is no evidence of delusional thought content and patient appears to answer all questions appropriately.  At this time patient appears to be stable to discharge home, with support system services in place.  She denies suicidal ideations, and is able to contract for safety.   She forwards much information about suicide and offers many protective factors to include sense of responsibility, loved ones, religious preference, and dislike of pain, availability of emergency services, and willingness to talk about depression and suicidal thoughts. Patient will benefit from medication management and therapy to help build coping skills and behavior modification to cope with pain. Will start Abilify at this time, as patient has an upcoming discharge date tomorrow for rehab. Eventually may benefit from antidepressant therapy, possibly one that helps with pain such as Duloxetine.    HPI:  Patient had a left knee surgery with proximal tibial hardware removal, tibial incision debridement of deep abscess., left knee scope with medial femoral chondroplasty, lysis of adhesions and manipulation under anesthesia on 08/28/2020. There was concern about infection of the hardware as there was an area of purulent material. Specimens were collected. Cultures were negative. There were no signs of infection in the joint. She had uncontrollable pain after surgery. She had an ultrasound of the left knee there was no  obvious left knee effusion, but she did have an area just medial to her hardware incision that was consistent with an abscess that tracked posteriorly. This was aspirated and sent for cultures. She is scheduled for repeat washout today.   Past Psychiatric History: Adjustment reaction 30+ years ago after divorce from 1st husband.   Risk to Self:  No Risk to Others:   No Prior Inpatient Therapy:   Denies Prior Outpatient Therapy:   No current outpatient behavioral health services. Reports seeing a therapist in 01/2020   Past Medical History:  Past Medical History:  Diagnosis Date  . Anxiety   . Arthritis   . Depression     Past Surgical History:  Procedure Laterality Date  . ABDOMINAL HYSTERECTOMY    . BREAST SURGERY     left lumpectomy   . gastric bypass surgery     . HARDWARE REMOVAL Left 08/28/2020   Procedure: HARDWARE REMOVAL;  Surgeon: Hiram Gash, MD;  Location: [facility];  Service: Orthopedics;  Laterality: Left;  . hemorrhjoid  surgery     . IRRIGATION AND DEBRIDEMENT ABSCESS Left 09/11/2020   Procedure: Irrigation and debridement of deep abscess- left leg, septic joint;  Surgeon: Hiram Gash, MD;  Location: [facility];  Service: Orthopedics;  Laterality: Left;  . KNEE ARTHROSCOPY WITH MEDIAL MENISECTOMY Left 08/28/2020   Procedure: KNEE ARTHROSCOPY WITH CHONDROPLASTY, MANIPULATION WITH LYSIS OF ADHESIONS, POSSIBLE MEDIAL MENISECTOMY;  Surgeon: Hiram Gash, MD;  Location: [facility];  Service: Orthopedics;  Laterality: Left;  . left femur fracture surgery      x 2  . ORIF TIBIA PLATEAU Left 02/26/2020   Procedure: OPEN REDUCTION INTERNAL FIXATION (ORIF) TIBIAL PLATEAU;  Surgeon: Shona Needles, MD;  Location: [facility];  Service: Orthopedics;  Laterality: Left;  . TUBAL LIGATION     Family History: History reviewed. No pertinent family history. Family Psychiatric  History: Son attempted suicide at the age of 57-11.  Social History:  Social History   Substance and Sexual Activity   Alcohol Use Yes   Comment: 12 beers per week      Social History   Substance and Sexual Activity  Drug Use Never    Social History   Socioeconomic History  . Marital status: Married    Spouse name: Not on file  . Number of children: Not on file  . Years of education: Not on file  . Highest education level: Not on file  Occupational History  . Not on file  Tobacco Use  . Smoking status: Current Every Day Smoker    Types: Cigars  . Smokeless tobacco: Never Used  Vaping Use  . Vaping Use: Former  . Substances: Nicotine, Flavoring  Substance and Sexual Activity  . Alcohol use: Yes    Comment: 12 beers per week   . Drug use: Never  . Sexual activity: Not on file  Other Topics Concern  . Not on file  Social History Narrative  . Not on file   Social Determinants of Health   Financial Resource Strain: Not on file  Food Insecurity: Not on file  Transportation Needs: Not on file  Physical Activity: Not on file  Stress: Not on file  Social Connections: Not on file   Additional Social History:    Allergies:  No Known Allergies  Labs:  Results for orders placed or performed during the hospital encounter of 09/11/20 (from the past 48 hour(s))  Glucose, capillary     Status: Abnormal   Collection Time: 09/17/20  4:55 PM  Result Value Ref Range   Glucose-Capillary 104 (H) 70 - 99 mg/dL    Comment: Glucose reference range applies only to samples taken after fasting for at least 8 hours.  Vancomycin, peak     Status: Abnormal   Collection Time: 09/17/20 10:19 PM  Result Value Ref Range   Vancomycin Pk 28 (L) 30 - 40 ug/mL    Comment: Performed at [facility], [place] [address] [place], [place] [address]  Creatinine, serum     Status: None   Collection Time: 09/18/20  5:42 AM  Result Value Ref Range   Creatinine, Ser 0.47 0.44 - 1.00 mg/dL   GFR, Estimated >60 >60 mL/min    Comment: (NOTE) Calculated using the CKD-EPI Creatinine Equation  (2021) Performed at [facility], [place] [address], [place], [place] [address]   Vancomycin, trough     Status: Abnormal   Collection Time: 09/18/20  5:42 AM  Result Value Ref Range   Vancomycin Tr 12 (  L) 15 - 20 ug/mL    Comment: Performed at [facility], [place] [address], [place], [place] [address]  Creatinine, serum     Status: None   Collection Time: 09/19/20  3:07 AM  Result Value Ref Range   Creatinine, Ser 0.48 0.44 - 1.00 mg/dL   GFR, Estimated >60 >60 mL/min    Comment: (NOTE) Calculated using the CKD-EPI Creatinine Equation (2021) Performed at [facility], [place] [address], [place], [place] [address]     Current Facility-Administered Medications  Medication Dose Route Frequency Provider Last Rate Last Admin  . acetaminophen (TYLENOL) tablet 650 mg  650 mg Oral Q8H McBane, Caroline N, PA-C   650 mg at 09/19/20 1338  . bisacodyl (DULCOLAX) EC tablet 5 mg  5 mg Oral Daily PRN McBane, Maylene Roes, PA-C      . cefTRIAXone (ROCEPHIN) 2 g in sodium chloride 0.9 % 100 mL IVPB  2 g Intravenous Q24H Thayer Headings, MD 200 mL/hr at 09/18/20 1621 2 g at 09/18/20 1621  . celecoxib (CELEBREX) capsule 200 mg  200 mg Oral BID Ethelda Chick, PA-C   200 mg at 09/19/20 0915  . Chlorhexidine Gluconate Cloth 2 % PADS 6 each  6 each Topical Daily Hiram Gash, MD   6 each at 09/19/20 [phone]  . diphenhydrAMINE (BENADRYL) 12.5 MG/5ML elixir 12.5-25 mg  12.5-25 mg Oral Q4H PRN Ethelda Chick, PA-C   25 mg at 09/18/20 2356  . docusate sodium (COLACE) capsule 100 mg  100 mg Oral BID Ethelda Chick, PA-C   100 mg at 09/19/20 0915  . enoxaparin (LOVENOX) injection 40 mg  40 mg Subcutaneous Q24H Ethelda Chick, PA-C   40 mg at 09/19/20 0809  . gabapentin (NEURONTIN) capsule 800 mg  800 mg Oral TID Ethelda Chick, PA-C   800 mg at 09/19/20 0915  . lactated ringers infusion   Intravenous Continuous Ethelda Chick, PA-C 50 mL/hr at  09/11/20 1539 New Bag at 09/11/20 1539  . lidocaine (LIDODERM) 5 % 1 patch  1 patch Transdermal Q24H Ethelda Chick, PA-C   1 patch at 09/19/20 [phone]  . magnesium citrate solution 1 Bottle  1 Bottle Oral Once PRN McBane, Maylene Roes, PA-C      . methocarbamol (ROBAXIN) tablet 750 mg  750 mg Oral Q6H PRN McBane, Maylene Roes, PA-C       Or  . methocarbamol (ROBAXIN) 500 mg in dextrose 5 % 50 mL IVPB  500 mg Intravenous Q6H PRN McBane, Maylene Roes, PA-C      . metoCLOPramide (REGLAN) tablet 5-10 mg  5-10 mg Oral Q8H PRN McBane, Maylene Roes, PA-C       Or  . metoCLOPramide (REGLAN) injection 5-10 mg  5-10 mg Intravenous Q8H PRN McBane, Caroline N, PA-C      . ondansetron (ZOFRAN) tablet 4 mg  4 mg Oral Q6H PRN Ethelda Chick, PA-C   4 mg at 09/13/20 1328   Or  . ondansetron (ZOFRAN) injection 4 mg  4 mg Intravenous Q6H PRN McBane, Caroline N, PA-C      . oxyCODONE (Oxy IR/ROXICODONE) immediate release tablet 10-15 mg  10-15 mg Oral Q4H PRN McBane, Caroline N, PA-C      . oxyCODONE (Oxy IR/ROXICODONE) immediate release tablet 5-10 mg  5-10 mg Oral Q4H PRN McBane, Caroline N, PA-C      . pantoprazole (PROTONIX) EC tablet 40 mg  40 mg Oral Daily McBane,  Maylene Roes, PA-C   40 mg at 09/19/20 0915  . polyethylene glycol (MIRALAX / GLYCOLAX) packet 17 g  17 g Oral Daily PRN McBane, Caroline N, PA-C      . sodium chloride flush (NS) 0.9 % injection 10-40 mL  10-40 mL Intracatheter Q12H Hiram Gash, MD   10 mL at 09/18/20 2216  . sodium chloride flush (NS) 0.9 % injection 10-40 mL  10-40 mL Intracatheter PRN Hiram Gash, MD   10 mL at 09/19/20 0322  . vancomycin (VANCOREADY) IVPB 1500 mg/300 mL  1,500 mg Intravenous Q12H Polly Cobia, RPH   Stopped at 09/19/20 5284  . zolpidem (AMBIEN) tablet 5 mg  5 mg Oral QHS PRN Ethelda Chick, PA-C   5 mg at 09/18/20 2216    Musculoskeletal: Strength & Muscle Tone: within normal limits Gait & Station: normal Patient leans:  N/A            Psychiatric Specialty Exam:  Presentation  General Appearance: Appropriate for Environment; Casual  Eye Contact:Good  Speech:Clear and Coherent; Normal Rate  Speech Volume:Normal  Handedness:Right   Mood and Affect  Mood:Anxious  Affect:Appropriate; Congruent   Thought Process  Thought Processes:Coherent; Goal Directed; Linear  Descriptions of Associations:Circumstantial  Orientation:Full (Time, Place and Person)  Thought Content:Logical  History of Schizophrenia/Schizoaffective disorder:No data recorded Duration of Psychotic Symptoms:No data recorded Hallucinations:Hallucinations: None  Ideas of Reference:None  Suicidal Thoughts:Suicidal Thoughts: Yes, Passive SI Passive Intent and/or Plan: Without Intent; Without Plan; Without Means to Carry Out; Without Access to Means  Homicidal Thoughts:Homicidal Thoughts: No   Sensorium  Memory:Immediate Good; Recent Good; Remote Good  Judgment:Good  Insight:Good   Executive Functions  Concentration:Good  Attention Span:Good  [place] of Knowledge:Good  Language:Good   Psychomotor Activity  Psychomotor Activity:Psychomotor Activity: Normal   Assets  Assets:Communication Skills; Desire for Improvement; Financial Resources/Insurance; Housing; Social Support; Resilience; Physical Health; Leisure Time; Transportation; Vocational/Educational   Sleep  Sleep:Sleep: Fair   Physical Exam: Physical Exam ROS Blood pressure (!) 145/68, pulse 66, temperature 98.1 F (36.7 C), temperature source Oral, resp. rate 16, height 5\' 8"  (1.727 m), weight (!) 149.7 kg, SpO2 96 %. Body mass index is 50.18 kg/m.  She does not appear to be at acute risk of harm to self has no hx of suicide attempts her actions seem to be stress reaction seeking attention by family members and hospital staff.   Although this patient presented to the hospital for surgery, she does not appear to be at  imminent risk of dangerousness to self and dangerousness to others at this time. While future psychiatric events cannot be accurately predicted, the patient does not necessitate nor desire further acute inpatient psychiatric care at this time. There is a possibility that she would benefit from an inpatient hospital stay to learn additional coping skills and provide structure and safety outside of the context of her psychosocial stressors; however, this must be weighed against the patient's denial of dangerousness and her lack of desire for voluntary admission.   While this patient presents with risk factors that include  age >27, suicidal threats without a plan, chronic pain; these are mitigated by protective factors which include no know access to weapons or firearms, no history of previous suicide attempts , no history of violence, supportive family, presence of a significant relationship, presence of an available support system, current treatment compliance, support system in agreement with treatment recommendations and presence of a safety plan with follow-up care.  Treatment Plan Summary: Plan patient agreeable to start Abilify 2mg  po daily for depression, anxiety, suicidal thoughts. She was previously on fetzima and abilify for mangement of depression, that she reports worked well for her. Discussed cost and formulary concerns with starting Fetzima and tentative discharge date of tomorrow.   -Start Abilify 2mg  po today. Then increase Abilify 5mg  po daily for management of depression, suicidal thoughts. Will obtain EKG at this time. New orders placed.  -TOC referral for outpatient behavioral health services, she will benefit from Intensive Outpatient Programming (IOP) for behavioral therapy and coping skills. Most programs are virtual and can take place while receiving skilled nursing services.  Discussed with TOC Jinny Blossom) regarding referral and or resources, and was going to follow up after speaking  with supervisor.  -Place referral to [facility] IOP.     Disposition: No evidence of imminent risk to self or others at present.   Patient does not meet criteria for psychiatric inpatient admission. Supportive therapy provided about ongoing stressors. Refer to IOP. Discussed crisis plan, support from social network, calling 911, coming to the Emergency Department, and calling Suicide Hotline.  Suella Broad, FNP 09/19/2020 2:27 PM

## 2020-09-19 NOTE — Progress Notes (Signed)
Called ortho tech again stated she will come now however the pt said she is going outside with her husband now and wanted ortho tech to do it later. Patient agrees to have it put on by 7PM. Ortho tech made aware of time to come and start CPM.

## 2020-09-19 NOTE — Consult Note (Signed)
ORTHOPAEDIC CONSULTATION  REQUESTING PHYSICIAN: Hiram Gash, MD  Chief Complaint: Left knee pain.  HPI: [name] is a 65 y.o. female who presents with left knee medial tibial plateau fracture.  Patient initially underwent open reduction internal fixation with anatomic alignment of the medial joint line.  Patient has been having increased pain the hardware was removed cultures have been negative and arthroscopy performed to debride the unstable margin of the extruded medial meniscal tear.  Patient states that it took her 3 years to recover from the femoral nail on the left femur and patient is extremely concerned about the time required for the left knee to heal.  Past Medical History:  Diagnosis Date  . Anxiety   . Arthritis   . Depression    Past Surgical History:  Procedure Laterality Date  . ABDOMINAL HYSTERECTOMY    . BREAST SURGERY     left lumpectomy   . gastric bypass surgery     . HARDWARE REMOVAL Left 08/28/2020   Procedure: HARDWARE REMOVAL;  Surgeon: Hiram Gash, MD;  Location: [facility];  Service: Orthopedics;  Laterality: Left;  . hemorrhjoid surgery     . IRRIGATION AND DEBRIDEMENT ABSCESS Left 09/11/2020   Procedure: Irrigation and debridement of deep abscess- left leg, septic joint;  Surgeon: Hiram Gash, MD;  Location: [facility];  Service: Orthopedics;  Laterality: Left;  . KNEE ARTHROSCOPY WITH MEDIAL MENISECTOMY Left 08/28/2020   Procedure: KNEE ARTHROSCOPY WITH CHONDROPLASTY, MANIPULATION WITH LYSIS OF ADHESIONS, POSSIBLE MEDIAL MENISECTOMY;  Surgeon: Hiram Gash, MD;  Location: [facility];  Service: Orthopedics;  Laterality: Left;  . left femur fracture surgery      x 2  . ORIF TIBIA PLATEAU Left 02/26/2020   Procedure: OPEN REDUCTION INTERNAL FIXATION (ORIF) TIBIAL PLATEAU;  Surgeon: Shona Needles, MD;  Location: [facility];  Service: Orthopedics;  Laterality: Left;  . TUBAL LIGATION     Social History   Socioeconomic History  . Marital status: Married     Spouse name: Not on file  . Number of children: Not on file  . Years of education: Not on file  . Highest education level: Not on file  Occupational History  . Not on file  Tobacco Use  . Smoking status: Current Every Day Smoker    Types: Cigars  . Smokeless tobacco: Never Used  Vaping Use  . Vaping Use: Former  . Substances: Nicotine, Flavoring  Substance and Sexual Activity  . Alcohol use: Yes    Comment: 12 beers per week   . Drug use: Never  . Sexual activity: Not on file  Other Topics Concern  . Not on file  Social History Narrative  . Not on file   Social Determinants of Health   Financial Resource Strain: Not on file  Food Insecurity: Not on file  Transportation Needs: Not on file  Physical Activity: Not on file  Stress: Not on file  Social Connections: Not on file   History reviewed. No pertinent family history. - negative except otherwise stated in the family history section No Known Allergies Prior to Admission medications   Medication Sig Start Date End Date Taking? Authorizing Provider  aspirin (ASPIRIN CHILDRENS) 81 MG chewable tablet Chew 1 tablet (81 mg total) by mouth 2 (two) times daily. For DVT prophylaxis after surgery 08/28/20  Yes McBane, Maylene Roes, PA-C  celecoxib (CELEBREX) 200 MG capsule Take 1 capsule (200 mg total) by mouth 2 (two) times daily. 08/28/20 09/27/20 Yes McBane,  Maylene Roes, PA-C  gabapentin (NEURONTIN) 100 MG capsule Take 1 capsule (100 mg total) by mouth 3 (three) times daily. For pain 08/28/20  Yes McBane, Maylene Roes, PA-C  liver oil-zinc oxide (DESITIN) 40 % ointment Apply 1 application topically daily as needed for irritation.   Yes [provider]  methocarbamol (ROBAXIN) 500 MG tablet Take 1 tablet (500 mg total) by mouth every 8 (eight) hours as needed for muscle spasms. 08/28/20  Yes McBane, Maylene Roes, PA-C  omeprazole (PRILOSEC) 20 MG capsule Take 1 capsule (20 mg total) by mouth daily. To gastric protection while taking  NSAIDs 08/28/20 09/27/20 Yes McBane, Maylene Roes, PA-C  VITAMIN D PO Take 1 tablet by mouth daily.   Yes [provider]   No results found. - pertinent xrays, CT, MRI studies were reviewed and independently interpreted  Positive ROS: All other systems have been reviewed and were otherwise negative with the exception of those mentioned in the HPI and as above.  Physical Exam: General: Alert, no acute distress Psychiatric: Patient is competent for consent with normal mood and affect Lymphatic: No axillary or cervical lymphadenopathy Cardiovascular: No pedal edema Respiratory: No cyanosis, no use of accessory musculature GI: No organomegaly, abdomen is soft and non-tender    Images:  @ENCIMAGES @  Labs:  Lab Results  Component Value Date   HGBA1C 5.7 (H) 09/13/2020   REPTSTATUS 09/16/2020 FINAL 09/11/2020   GRAMSTAIN  09/11/2020    RARE WBC PRESENT, PREDOMINANTLY PMN NO ORGANISMS SEEN    CULT  09/11/2020    No growth aerobically or anaerobically. Performed at [facility], [address], [place], [place] [address]     Lab Results  Component Value Date   ALBUMIN 3.0 (L) 03/04/2020   ALBUMIN 3.4 (L) 02/25/2020   ALBUMIN 3.9 01/17/2008     CBC EXTENDED Latest Ref Rng & Units 09/17/2020 09/16/2020 09/15/2020  WBC 4.0 - 10.5 K/uL 7.3 5.3 6.8  RBC 3.87 - 5.11 MIL/uL 4.08 4.08 4.03  HGB 12.0 - 15.0 g/dL 12.3 12.3 12.2  HCT 36.0 - 46.0 % 38.6 38.2 38.0  PLT 150 - 400 K/uL 240 239 235  NEUTROABS 1.7 - 7.7 K/uL - - -  LYMPHSABS 0.7 - 4.0 K/uL - - -    Neurologic: Patient does not have protective sensation bilateral lower extremities.   MUSCULOSKELETAL:   Skin: Examination there is no cellulitis no drainage there is no effusion to the left knee she is globally tender to palpation there is no clinical signs of infection.  Review of the MRI scan shows edema from the fracture but does not show any evidence of osteomyelitis or abscess.  The MRI scan does show  bone-on-bone contact of the medial joint line with an extruded medial meniscus.  Patient cannot do a straight leg raise without difficulty.  Patient's white blood cell count is 7.3 hemoglobin A1c 5.7 albumin 3.0.  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  Plan: Plan: Discussed with the patient that my recommendation would be to continue IV antibiotics as per infectious disease, discussed that with the involvement of the medial joint line with traumatic arthritis patient may require in 4 to 6 months a total knee arthroplasty.  Discussed that she is at increased risk of developing an infection with implantation of total knee hardware.  Discussed the importance of strengthening and conditioning for the knee.  Patient states that she does  not want to go through this recovery process again, that it took her 3 years to go through the recovery process for the femur fracture, status post intramedullary nailing.  Patient states that she wants to end her life at this time.  I discussed that I would be available at any time to answer further questions and to assist her with the healing process of this difficult fracture.  Thank you for the consult and the opportunity to see Ms. Lavell Luster, MD [place] [phone] 8:21 AM

## 2020-09-19 NOTE — Plan of Care (Signed)
?  Problem: Activity: ?Goal: Risk for activity intolerance will decrease ?Outcome: Progressing ?  ?Problem: Safety: ?Goal: Ability to remain free from injury will improve ?Outcome: Progressing ?  ?Problem: Pain Managment: ?Goal: General experience of comfort will improve ?Outcome: Progressing ?  ?

## 2020-09-19 NOTE — Plan of Care (Signed)
Plan of care reviewed and discussed with the patient. 

## 2020-09-19 NOTE — Progress Notes (Signed)
[name] responded to spiritual care consult for support for pt.  Pt. sitting in chair w/legs elevated when [place] arrived, agreeable to visit and inviting [name] to sit and talk.  Pt. shared that she and her two bothers took a siblings vacation in the [place] several years ago and that she suffered a fall that led to a dislocated hip and fractured femur.  These injuries seem to have been further complicated by surgical interventions in a local hospital and it seems that pt. has been dealing with progressive loss of mobility for the last three years, to the point where she has now lost her ability to engage in activities that had been life-giving in the past.   Visit interrupted by visit from Psychiatry in which pt. expressed having suicidal thoughts/intentions but also admitted not having the physical capacity to carry out a plan.  (Conversation documented at length in psychiatry consult not from today)  Overall, pt. expressed a distinction between continuing to 'survive' in her state of incapacity and the hope of 'living' again: regaining enough mobility to visit friends, take care of herself, etc.  Pt. says she very much wants to live as she defines 'living', but is having significant difficulty facing the possibility that 'survival' is her only option.  Pt. 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  "It was the hardest thing I have ever done... But it was also a huge gift to me," she shared.  Pt. says part of the difficulty of imagining a life being immobile is that she swore she would never allow herself to become as care-dependent as her mother became.  In addition, pt. shared, "I have no one to take care of me like I took care of my mother... I have always been a giver --not a taker."  Pt. has a son who lives in [place], [place] and has cousins and other relatives nearby but she says she has not shared the full extent of her medical condition with them for fear of  'whining.'  Pt. says that her husband is a 'narcissist' and that he offers minimal support to her ("For all intents and purposes I live alone").  Pt. shared that she grew up [facility] and was at one time very involved in her local church; she perceives a clear pattern of [place] in her life, as she described God helping her overcome difficult situations, addiction, relational problems, etc.  She has prayed that God would let her die in her sleep, but shared "I know He won't do that because I'm His child."  In extended visit, [place] provided supportive presence, active listening, and facilitated expression of emotions and concerns.  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    [place] prayed for pt. at end of visit w/pt.'s permission.  Chaplains remain available as needed.  Lindaann Pascal PRN Chaplain Pager: [phone]

## 2020-09-19 NOTE — Progress Notes (Signed)
    [facility] for Infectious Disease   Reason for visit: Follow up on septic joint and abscess  Interval History: she continues to have pain; repeat MRI unrevealing.  Second opinion by Dr. Sharol Given noted.    Physical Exam: Constitutional:  Vitals:   09/18/20 2141 09/19/20 0547  BP: 136/75 (!) 145/68  Pulse: 73 66  Resp: 16 16  Temp: (!) 97.4 F (36.3 C) 98.1 F (36.7 C)  SpO2: 98% 96%   patient appears in NAD  Impression: septic arthritis and abscess  Plan: 1.  No changes, cultures remain no growth.  Plan remains the same.

## 2020-09-19 NOTE — Progress Notes (Signed)
Called ortho stated will come and start CPM on Pt this PM.

## 2020-09-19 NOTE — Progress Notes (Signed)
Patients tearful, stated rather not being here than be a burden for others. Emotional support given to pt and will notified MD/PA. Patient stated after nurse mention it to [place] yesterday, she said she will give me something for it. No new order noted from yesterday.

## 2020-09-20 DIAGNOSIS — M1712 Unilateral primary osteoarthritis, left knee: Secondary | ICD-10-CM | POA: Diagnosis present

## 2020-09-20 LAB — GLUCOSE, CAPILLARY: Glucose-Capillary: 94 mg/dL (ref 70–99)

## 2020-09-20 MED ORDER — CLOTRIMAZOLE 1 % EX CREA
TOPICAL_CREAM | Freq: Two times a day (BID) | CUTANEOUS | Status: DC
  Administered 2020-09-20: 1 via TOPICAL
  Filled 2020-09-20: qty 15

## 2020-09-20 MED ORDER — ACETAMINOPHEN 500 MG PO TABS: 1000.0000 mg | ORAL_TABLET | Freq: Three times a day (TID) | ORAL | 0 refills | Status: AC

## 2020-09-20 MED ORDER — ARIPIPRAZOLE 5 MG PO TABS: 5.0000 mg | ORAL_TABLET | Freq: Every day | ORAL | Status: DC

## 2020-09-20 MED ORDER — CEFTRIAXONE IV (FOR PTA / DISCHARGE USE ONLY): 2.0000 g | INTRAVENOUS | 0 refills | Status: AC

## 2020-09-20 MED ORDER — HEPARIN SOD (PORK) LOCK FLUSH 100 UNIT/ML IV SOLN
250.0000 [IU] | INTRAVENOUS | Status: AC | PRN
  Administered 2020-09-20: 250 [IU]
  Filled 2020-09-20: qty 2.5

## 2020-09-20 MED ORDER — GABAPENTIN 800 MG PO TABS: 800.0000 mg | ORAL_TABLET | Freq: Three times a day (TID) | ORAL | 0 refills | Status: DC

## 2020-09-20 MED ORDER — VANCOMYCIN IV (FOR PTA / DISCHARGE USE ONLY): 1500.0000 mg | Freq: Two times a day (BID) | INTRAVENOUS | 0 refills | Status: AC

## 2020-09-20 NOTE — Progress Notes (Signed)
Called report to nurse Caryl Ada at [facility].

## 2020-09-20 NOTE — Discharge Instructions (Signed)
Ophelia Charter MD, MPH Noemi Chapel, PA-C [place] [address], [address] [phone] (tel)   [phone] (fax)  POST-OPERATIVE INSTRUCTIONS  Discharge Wound Care Instructions  Do NOT apply any ointments, solutions or lotions to surgical wounds.  These prevent needed drainage and even though solutions like hydrogen peroxide kill bacteria, they also damage cells lining the pin sites that help fight infection.  Applying lotions or ointments can keep the wounds moist and can cause them to breakdown and open up as well. This can increase the risk for infection. When in doubt call the office.  Surgical incisions should be dressed daily.  If any drainage is noted, use one layer of adaptic, then gauze, Kerlix, and an ace wrap.  Once the incision is completely dry and without drainage, it may be left open to air out.  Showering may begin 36-48 hours later.  Cleaning gently with soap and water.  Use the Cryocuff or Ice as often as possible for the first 7 days, then as needed for pain relief. Always keep a towel, ACE wrap or other barrier between the cooling unit and your skin.   Do not soak the knee in water or submerge it.   Do not go swimming in the pool or ocean until 4 weeks after surgery or when otherwise instructed.    Keep incisions as dry as possible.  BRACE/AMBULATION -  You will not need a brace after this procedure.  - You can put full weight on your operative leg -  Continue working with therapy for balance, gait, and range of motion - No restrictions on your range of motion -  Knees are at a high risk of stiffness post-operatively  - Use the CPM machine twice a day for 1-2 hours   - You have started with a range of motion from 0-20 degrees   - This can be increased 5 degrees each day as you tolerate   - There are no restrictions on your range of motion  REGIONAL ANESTHESIA (NERVE BLOCKS) - The anesthesia team may have performed a nerve block for  you if safe in the setting of your care.  This is a great tool used to minimize pain.  Typically the block may start wearing off overnight.  This can be a challenging period but please utilize your as needed pain medications to try and manage this period and know it will be a brief transition as the nerve block wears completely   POST-OP MEDICATIONS - Multimodal approach to pain control - In general your pain will be controlled with a combination of substances.  Prescriptions unless otherwise discussed are electronically sent to your pharmacy.  This is a carefully made plan we use to minimize narcotic use.     - Celebrex - Anti-inflammatory medication taken on a scheduled basis - Acetaminophen - Non-narcotic pain medicine taken on a scheduled basis  - Gabapentin - this is a non-narcotic pain medication, take as scheduled - Oxycodone - This is a strong narcotic, to be used only on an "as needed" basis for SEVERE pain.   - Begin to wean off this medication as soon as you are able - Lovenox  - This medicine is used to minimize the risk of blood clots after surgery. - Cyclobenzaprine - this is a muscle relaxer, take as needed for muscle spasms    FOLLOW-UP   Please call the office to schedule a follow-up appointment for your incision check, 14 days post-operatively.  IF  YOU HAVE ANY QUESTIONS, PLEASE FEEL FREE TO CALL OUR OFFICE.   HELPFUL INFORMATION    Keep your leg elevated to decrease swelling, which will then in turn decrease your pain. I would elevate the foot of your bed by putting a couple of couch pillows between your mattress and box spring. I would not keep pillow directly under your ankle.  - Do not sleep with a pillow behind your knee even if it is more comfortable as this may make it harder to get your knee fully straight long term.   There will be MORE swelling on days 1-3 than there is on the day of surgery.  This also is normal. The swelling will decrease with the  anti-inflammatory medication, ice and keeping it elevated. The swelling will make it more difficult to bend your knee. As the swelling goes down your motion will become easier   You may develop swelling and bruising that extends from your knee down to your calf and perhaps even to your foot over the next week. Do not be alarmed. This too is normal, and it is due to gravity   There may be some numbness adjacent to the incision site. This may last for 6-12 months or longer in some patients and is expected.   You may return to sedentary work/school in the next couple of days when you feel up to it. You will need to keep your leg elevated as much as possible    You should wean off your narcotic medicines as soon as you are able.  Most patients will be off or using minimal narcotics before their first postop appointment.    We suggest you use the pain medication the first night prior to going to bed, in order to ease any pain when the anesthesia wears off. You should avoid taking pain medications on an empty stomach as it will make you nauseous.   Do not drink alcoholic beverages or take illicit drugs when taking pain medications.   It is against the law to drive while taking narcotics. You cannot drive if your Right leg is in brace locked in extension.   Pain medication may make you constipated.  Below are a few solutions to try in this order:  o Decrease the amount of pain medication if you aren't having pain.  o Drink lots of decaffeinated fluids.  o Drink prune juice and/or eat dried prunes   o If the first 3 don't work start with additional solutions  o Take Colace - an over-the-counter stool softener  o Take Senokot - an over-the-counter laxative  o Take Miralax - a stronger over-the-counter laxative    For more information including helpful videos and documents visit our website:   [email or web address]   ADDITIONAL INFORMATION: - You should  make an appointment with your primary care provider once you discharge from the hospital, your blood pressure was elevated during your stay - Follow recommendations regarding outpatient behavioral health care - You will be contacted from a pain management provider, to help get your pain under control when you discharge from the hospital

## 2020-09-20 NOTE — Progress Notes (Signed)
Physical Therapy Treatment Patient Details Name: [name] MRN: [id number] DOB: 03-24-1956 Today's Date: 09/20/2020    History of Present Illness Patient had a left knee surgery with proximal tibial hardware removal, tibial incision debridement of deep abscess., left knee scope with medial femoral chondroplasty, lysis of adhesions and manipulation under anesthesia on 08/28/2020. There was concern about infection of the hardware as there was an area of purulent material. She is now s/p Left knee incision and debridement of deep abscess on 09/11/20,  Left knee septic joint incision and debridement on 5/25 with hemovac and wound vac.  Other PMH includes depression, arthritis, anxiety.  MRI left knee shows full-thickness cartilage loss of medial femoral tibial compartment.  Maceration of the posterior horn of the medial meniscus.  ACL, PCL, MCL, LCL all intact.  -MRI left shoulder reveals mild to moderate tendinosis of supraspinatus and infraspinatus tendon.  Muscle edema noted in the supraspinatus muscle and musculotendinous junction, likely muscle strain with partial tear.    PT Comments    Pt progressing slowly but steadily with mobility - ambulated increased distance but requiring increased time and multiple standing rest breaks to complete task.  Follow Up Recommendations  SNF     Equipment Recommendations  None recommended by PT    Recommendations for Other Services       Precautions / Restrictions Precautions Precautions: Fall Precaution Comments: Recent fall at home per ortho note Restrictions Weight Bearing Restrictions: No LLE Weight Bearing: Weight bearing as tolerated    Mobility  Bed Mobility               General bed mobility comments: up in chair and requests back to same    Transfers Overall transfer level: Needs assistance Equipment used: Rolling walker (2 wheeled) Transfers: Sit to/from Stand Sit to Stand: Supervision         General transfer comment:  up on first attempt which is improvement - pt reports needing multiple attempts prior  Ambulation/Gait Ambulation/Gait assistance: Min guard;Supervision Gait Distance (Feet): 100 Feet Assistive device: Rolling walker (2 wheeled) Gait Pattern/deviations: Step-to pattern;Decreased stance time - left;Decreased weight shift to left Gait velocity: decreased   General Gait Details: cues for sequence, distance limited by pain; min/guard for safety with turns   Stairs             Wheelchair Mobility    Modified Rankin (Stroke Patients Only)       Balance Overall balance assessment: Needs assistance;History of Falls Sitting-balance support: Feet supported Sitting balance-Leahy Scale: Good     Standing balance support: Single extremity supported Standing balance-Leahy Scale: Fair                              Cognition Arousal/Alertness: Awake/alert Behavior During Therapy: WFL for tasks assessed/performed Overall Cognitive Status: Within Functional Limits for tasks assessed                                 General Comments: pt is very pleasant and motivated despite incr pain in L knee and hip edema decr LLE      Exercises      General Comments        Pertinent Vitals/Pain Pain Assessment: 0-10 Faces Pain Scale: Hurts even more Pain Location: L knee Pain Descriptors / Indicators: Grimacing;Guarding;Sharp;Tender;Aching Pain Intervention(s): Limited activity within patient's tolerance;Monitored during session;Ice applied;Premedicated before session  Home Living                      Prior Function            PT Goals (current goals can now be found in the care plan section) Acute Rehab PT Goals Patient Stated Goal: To walk without pain PT Goal Formulation: With patient Time For Goal Achievement: 09/26/20 Potential to Achieve Goals: Good Progress towards PT goals: Progressing toward goals    Frequency    Min  5X/week      PT Plan Current plan remains appropriate    Co-evaluation              AM-PAC PT "6 Clicks" Mobility   Outcome Measure  Help needed turning from your back to your side while in a flat bed without using bedrails?: A Little Help needed moving from lying on your back to sitting on the side of a flat bed without using bedrails?: None Help needed moving to and from a bed to a chair (including a wheelchair)?: A Little Help needed standing up from a chair using your arms (e.g., wheelchair or bedside chair)?: A Little Help needed to walk in hospital room?: A Little Help needed climbing 3-5 steps with a railing? : A Lot 6 Click Score: 18    End of Session Equipment Utilized During Treatment: Gait belt Activity Tolerance: Patient limited by pain;Patient limited by fatigue Patient left: with call bell/phone within reach;in chair;with chair alarm set Nurse Communication: Mobility status PT Visit Diagnosis: Unsteadiness on feet (R26.81);Pain;Muscle weakness (generalized) (M62.81) Pain - Right/Left: Left Pain - part of body: Knee     Time: 4166-0630 PT Time Calculation (min) (ACUTE ONLY): 26 min  Charges:  $Gait Training: 23-37 mins                     Debe Coder [place] Pager [phone] Office [phone]    Timothey Dahlstrom 09/20/2020, 12:18 PM

## 2020-09-20 NOTE — TOC Transition Note (Signed)
Transition of Care [facility]) - CM/SW Discharge Note   Patient Details  Name: [name] MRN: [id number] Date of Birth: July 04, 1955  Transition of Care [facility]) CM/SW Contact:  Lennart Pall, LCSW Phone Number: 09/20/2020, 12:29 PM   Clinical Narrative:    Pt discharging today to [facility] via spouse/ private vehicle.  No further TOC needs.   Final next level of care: Skilled Nursing Facility Barriers to Discharge: Barriers Resolved   Patient Goals and CMS Choice Patient states their goals for this hospitalization and ongoing recovery are:: Go to short-term rehab CMS Medicare.gov Compare Post Acute Care list provided to:: Patient Choice offered to / list presented to : Patient  Discharge Placement              Patient chooses bed at: [facility] Patient to be transferred to facility by: spouse in private vehicle Name of family member notified: spouse    Discharge Plan and Services In-house Referral: Clinical Social Work   Post Acute Care Choice: [place]          DME Arranged: N/A DME Agency: NA                  Social Determinants of Health ([place]) Interventions     Readmission Risk Interventions Readmission Risk Prevention Plan 02/27/2020  Post Dischage Appt Complete  Medication Screening Complete  Transportation Screening Complete  Some recent data might be hidden

## 2020-09-20 NOTE — Progress Notes (Signed)
The patient is alert and oriented and has been seen by her physician. The orders for discharge were written. Went over discharge instructions with patient and family. She is being discharged via wheelchair to her ride with all of her belongings. Family is driving patient to [facility].

## 2020-09-24 ENCOUNTER — Ambulatory Visit: Payer: BC Managed Care – PPO | Admitting: Physical Therapy

## 2020-09-26 ENCOUNTER — Ambulatory Visit: Payer: BC Managed Care – PPO | Admitting: Physical Therapy

## 2020-09-30 ENCOUNTER — Other Ambulatory Visit: Payer: BC Managed Care – PPO

## 2020-09-30 ENCOUNTER — Inpatient Hospital Stay: Admission: RE | Admit: 2020-09-30 | Payer: BC Managed Care – PPO | Source: Ambulatory Visit

## 2020-10-02 ENCOUNTER — Telehealth: Payer: Self-pay

## 2020-10-02 NOTE — Telephone Encounter (Signed)
A Nurse with [facility] called in regards to patient following up with Dr. Linus Salmons. Patient did not have a hospital follow up scheduled yet. Patient is scheduled with Dr. Baxter Flattery on 10/07/20. Dr. Linus Salmons did not have any availability.   I also spoke to [facility] at the SNF [facility]) where the patient is and she stated that there orders have the patient ending IV rocephin on 6/19 and IV vancomycin on 10/05/20. Patient did miss a dose of rocephin on 09/29/20 and did not start the vancomycin until 09/23/20. Randell Patient will be faxing over patient's labs as well. Mohsin Crum T Brooks Sailors

## 2020-10-03 ENCOUNTER — Ambulatory Visit: Payer: BC Managed Care – PPO | Admitting: Infectious Diseases

## 2020-10-07 ENCOUNTER — Other Ambulatory Visit: Payer: Self-pay

## 2020-10-07 ENCOUNTER — Ambulatory Visit (INDEPENDENT_AMBULATORY_CARE_PROVIDER_SITE_OTHER): Payer: BC Managed Care – PPO | Admitting: Internal Medicine

## 2020-10-07 VITALS — BP 148/79 | HR 73

## 2020-10-07 DIAGNOSIS — M00862 Arthritis due to other bacteria, left knee: Secondary | ICD-10-CM | POA: Diagnosis not present

## 2020-10-07 DIAGNOSIS — E8770 Fluid overload, unspecified: Secondary | ICD-10-CM

## 2020-10-07 DIAGNOSIS — M7989 Other specified soft tissue disorders: Secondary | ICD-10-CM

## 2020-10-07 NOTE — Progress Notes (Signed)
RFV: hospital follow up  Patient ID: [name], female   DOB: [birth year], 65 y.o.   MRN: [id number]  HPI [name] is a 65yo F who sustained tibial plateau fracture requiring ORIF and HW placement in early November 2021. Over the course of the next 6 months, she had increasing difficulty with her left knee difficulty with full range of motion and pain, suggesting HW malfunction. She underwent lysis adhesion and HW removal on 5/11, but was surprised to find infected hematoma.- thus debridement. Due to not improving, she had repeat I X D of joint space concerning for septic arthritis on 5/25. She was treated for culture negative septic arthritis with vancomycin and ceftriaxone at SNF which ended on 6/18 per dr comer's recs for 3 wk of iv abtx. She presents to clinic for follow up. Over the last few weeks, her left leg has become increasingly swollen. She is unclear what medication she has been given at snf. (It looks like she was started on lasix 87m on 6/18)  Outpatient Encounter Medications as of 10/07/2020  Medication Sig   ARIPiprazole (ABILIFY) 5 MG tablet Take 1 tablet (5 mg total) by mouth daily.   cyclobenzaprine (FLEXERIL) 10 MG tablet Take 1 tablet (10 mg total) by mouth 3 (three) times daily as needed for muscle spasms.   enoxaparin (LOVENOX) 40 MG/0.4ML injection Inject 0.4 mLs (40 mg total) into the skin daily.   gabapentin (NEURONTIN) 800 MG tablet Take 1 tablet (800 mg total) by mouth 3 (three) times daily.   liver oil-zinc oxide (DESITIN) 40 % ointment Apply 1 application topically daily as needed for irritation.   oxyCODONE 10 MG TABS Take 1 pills every 6 hrs as needed for severe pain   VITAMIN D PO Take 1 tablet by mouth daily.   No facility-administered encounter medications on file as of 10/07/2020.     Patient Active Problem List   Diagnosis Date Noted   Arthritis of left knee 09/20/2020   Septic arthritis of knee, left ([place] 09/17/2020   Cellulitis and abscess of lower  extremity 09/11/2020   Debility 05/02/2020   Urinary incontinence 05/02/2020   Pain in left shin    Urinary frequency    Sleep disturbance    Pain    Anxiety state    Urinary urgency    Drug induced constipation    Post-operative pain    Benign essential HTN    Pruritus    Left medial tibial plateau fracture 03/01/2020   Morbid obesity ([place]    Acute blood loss anemia    Postoperative pain    Tibial plateau fracture 02/25/2020   Tibial plateau fracture, left 02/25/2020   Hyponatremia 02/25/2020   Alcohol use disorder, moderate, dependence ([place] 02/25/2020   Essential hypertension 02/25/2020     Health Maintenance Due  Topic Date Due   Pneumococcal Vaccine 042667Years old (1 - PCV) Never done   Hepatitis C Screening  Never done   TETANUS/TDAP  Never done   PAP SMEAR-Modifier  Never done   COLONOSCOPY (Pts 45-420yrInsurance coverage will need to be confirmed)  Never done   MAMMOGRAM  Never done   Zoster Vaccines- Shingrix (1 of 2) Never done     Review of Systems Left leg swelling. 12 point ros is otherwise negative Physical Exam   BP (!) 148/79   Pulse 73   Physical Exam  Constitutional:  oriented to person, place, and time. appears well-developed and well-nourished. No distress.  HENT: [place]/AT, PERRLA, no  scleral icterus Mouth/Throat: Oropharynx is clear and moist. No oropharyngeal exudate.  Cardiovascular: Normal rate, regular rhythm and normal heart sounds. Exam reveals no gallop and no friction rub.  No murmur heard.  Pulmonary/Chest: Effort normal and breath sounds normal. No respiratory distress.  has no wheezes.  Ext: right leg pitting edema +2-+3. Sutures appear tight but no erythema or drainage. Up to patella. Right leg only trace swelling Lymphadenopathy: no cervical adenopathy. No axillary adenopathy Neurological: alert and oriented to person, place, and time.  Skin: Skin is warm and dry. No rash noted. No erythema.  Psychiatric: a normal mood and affect.   behavior is normal.    CBC Lab Results  Component Value Date   WBC 7.3 09/17/2020   RBC 4.08 09/17/2020   HGB 12.3 09/17/2020   HCT 38.6 09/17/2020   PLT 240 09/17/2020   MCV 94.6 09/17/2020   MCH 30.1 09/17/2020   MCHC 31.9 09/17/2020   RDW 15.6 (H) 09/17/2020   LYMPHSABS 1.2 03/11/2020   MONOABS 0.5 03/11/2020   EOSABS 0.2 03/11/2020    BMET Lab Results  Component Value Date   NA 138 09/17/2020   K 3.9 09/17/2020   CL 102 09/17/2020   CO2 30 09/17/2020   GLUCOSE 104 (H) 09/17/2020   BUN 13 09/17/2020   CREATININE 0.48 09/19/2020   CALCIUM 8.9 09/17/2020   GFRNONAA >60 09/19/2020   GFRAA  04/17/2008    >60        The eGFR has been calculated using the MDRD equation. This calculation has not been validated in all clinical      Assessment and Plan  Septic arthritis, culture negative of left knee = will transition her to doxycycline 110m po bid x 2 wk. Plan to stop taking vancomycin and ceftriaxone. Will check labs  Volume overload =recommend 7 days of lasix 24mpo daily plus potassium k+ 2045mdaily.  To see her pcp to see if need further diuresis. Maybe worth PCP to do cardiac work up if still not improved.  Lower leg swelling = still concern for at risk for DVT. Will do vascular study of left leg to rule out dvt.  Rtc in 2 wk  Will send note to pcp  [name] B. [facility] [phone]

## 2020-10-08 ENCOUNTER — Ambulatory Visit (HOSPITAL_COMMUNITY)
Admission: RE | Admit: 2020-10-08 | Discharge: 2020-10-08 | Disposition: A | Discharge status: Home / Self Care | Payer: BC Managed Care – PPO | Source: Ambulatory Visit | Attending: Internal Medicine | Admitting: Internal Medicine

## 2020-10-08 ENCOUNTER — Telehealth: Payer: Self-pay

## 2020-10-08 DIAGNOSIS — M7989 Other specified soft tissue disorders: Secondary | ICD-10-CM

## 2020-10-08 LAB — BASIC METABOLIC PANEL
BUN/Creatinine Ratio: 20 (calc) (ref 6–22)
BUN: 9 mg/dL (ref 7–25)
CO2: 30 mmol/L (ref 20–32)
Calcium: 9.1 mg/dL (ref 8.6–10.4)
Chloride: 105 mmol/L (ref 98–110)
Creat: 0.44 mg/dL — ABNORMAL LOW (ref 0.50–0.99)
Glucose, Bld: 97 mg/dL (ref 65–99)
Potassium: 4.1 mmol/L (ref 3.5–5.3)
Sodium: 142 mmol/L (ref 135–146)

## 2020-10-08 LAB — CBC WITH DIFFERENTIAL/PLATELET
Absolute Monocytes: 561 cells/uL (ref 200–950)
Basophils Absolute: 53 cells/uL (ref 0–200)
Basophils Relative: 0.6 %
Eosinophils Absolute: 356 cells/uL (ref 15–500)
Eosinophils Relative: 4 %
HCT: 36.3 % (ref 35.0–45.0)
Hemoglobin: 11.5 g/dL — ABNORMAL LOW (ref 11.7–15.5)
Lymphs Abs: 828 cells/uL — ABNORMAL LOW (ref 850–3900)
MCH: 28.9 pg (ref 27.0–33.0)
MCHC: 31.7 g/dL — ABNORMAL LOW (ref 32.0–36.0)
MCV: 91.2 fL (ref 80.0–100.0)
MPV: 10.1 fL (ref 7.5–12.5)
Monocytes Relative: 6.3 %
Neutro Abs: 7102 cells/uL (ref 1500–7800)
Neutrophils Relative %: 79.8 %
Platelets: 269 10*3/uL (ref 140–400)
RBC: 3.98 10*6/uL (ref 3.80–5.10)
RDW: 14.3 % (ref 11.0–15.0)
Total Lymphocyte: 9.3 %
WBC: 8.9 10*3/uL (ref 3.8–10.8)

## 2020-10-08 LAB — MAGNESIUM: Magnesium: 2.1 mg/dL (ref 1.5–2.5)

## 2020-10-08 LAB — C-REACTIVE PROTEIN: CRP: 42.9 mg/L — ABNORMAL HIGH (ref <8.0)

## 2020-10-08 LAB — SEDIMENTATION RATE: Sed Rate: 17 mm/h (ref 0–30)

## 2020-10-08 NOTE — Telephone Encounter (Signed)
Received voicemail from Rodman Key at cardiovascular imaging regarding preliminary results for ultrasound of patient's left leg. Preliminary report is negative for DVT, exam limited by body habitus. RN verbally relayed results to Dr. Baxter Flattery. Preliminary report available in Epic.   Beryle Flock, RN

## 2020-10-09 LAB — FUNGUS CULTURE WITH STAIN

## 2020-10-09 LAB — FUNGUS CULTURE RESULT

## 2020-10-09 LAB — FUNGAL ORGANISM REFLEX

## 2020-10-15 ENCOUNTER — Other Ambulatory Visit: Payer: Self-pay

## 2020-10-15 ENCOUNTER — Ambulatory Visit (INDEPENDENT_AMBULATORY_CARE_PROVIDER_SITE_OTHER): Payer: BC Managed Care – PPO | Admitting: Orthopedic Surgery

## 2020-10-15 ENCOUNTER — Encounter: Payer: Self-pay | Admitting: Orthopedic Surgery

## 2020-10-15 ENCOUNTER — Ambulatory Visit: Payer: Self-pay

## 2020-10-15 DIAGNOSIS — M86262 Subacute osteomyelitis, left tibia and fibula: Secondary | ICD-10-CM

## 2020-10-15 DIAGNOSIS — G8929 Other chronic pain: Secondary | ICD-10-CM

## 2020-10-15 DIAGNOSIS — M25562 Pain in left knee: Secondary | ICD-10-CM | POA: Diagnosis not present

## 2020-10-15 NOTE — Progress Notes (Signed)
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  Patient initially underwent open reduction internal fixation with plates and screws of the medial tibial plateau fracture.  Patient subsequently developed infection hardware was removed and patient underwent 2 further surgeries in May for knee arthroscopy as well as irrigation and debridement of the deep abscess.  Patient has been on a prolonged course of IV antibiotics as well as oral antibiotics.  She is seen at this time for evaluation and referral from Dr. Griffin Basil with purulent drainage from the anterior incision and hard induration and cellulitis of the left leg.  Ultrasound has been negative for DVT.  Labs show a C-reactive protein of 42.9.  CBC shows a hemoglobin of 11.5 with a white blood cell count of 8.9 which is slowly increasing.  She has absolute neutrophil count of 7100.  With a neutrophil percentage of 80%.  Assessment & Plan: Visit Diagnoses:  1. Chronic pain of left knee   2. Subacute osteomyelitis of left tibia (HCC)     Plan: Discussed with the patient with the worsening infection of the proximal tibial plateau despite aggressive limb salvage intervention her best option would be to proceed with an above-the-knee amputation.  Risks and benefits were discussed patient states she understands this was also discussed with her husband present.  We will plan for surgery this week and anticipate discharge to [facility] [facility] care skilled nursing.  Follow-Up Instructions: No follow-ups on file.   Ortho Exam  Patient is alert, oriented, no adenopathy,  well-dressed, normal affect, normal respiratory effort. Examination patient has painful induration and cellulitis of the entire left leg she is also developing swelling of the left thigh.  There is purulent drainage from the anterior medial longitudinal incision.  Patient has progressive advanced collapse of the medial tibial plateau.  Imaging: XR Knee 1-2 Views Left  Result Date: 10/15/2020 2 view radiographs of the left knee shows collapse of the proximal tibial medial metaphysis.  No evidence of any lytic destructive changes of the femur.  The femoral nail is in place with bent screws.  No images are attached to the encounter.  Labs: Lab Results  Component Value Date   HGBA1C 5.7 (H) 09/13/2020   ESRSEDRATE 17 10/07/2020   CRP 42.9 (H) 10/07/2020   REPTSTATUS 09/16/2020 FINAL 09/11/2020   GRAMSTAIN  09/11/2020    RARE WBC PRESENT, PREDOMINANTLY PMN NO ORGANISMS SEEN    CULT  09/11/2020    No growth aerobically or anaerobically. Performed at [facility], [place] [address] [place], [place] [address]      Lab Results  Component Value Date   ALBUMIN 3.0 (L) 03/04/2020   ALBUMIN 3.4 (L) 02/25/2020   ALBUMIN 3.9 01/17/2008    Lab Results  Component Value Date   MG 2.1 10/07/2020   Lab Results  Component Value Date   VD25OH 28.94 (L) 03/15/2020   VD25OH 18.03 (L) 02/25/2020  No results found for: PREALBUMIN CBC EXTENDED Latest Ref Rng & Units 10/07/2020 09/17/2020 09/16/2020  WBC 3.8 - 10.8 Thousand/uL 8.9 7.3 5.3  RBC 3.80 - 5.10 Million/uL 3.98 4.08 4.08  HGB 11.7 - 15.5 g/dL 11.5(L) 12.3 12.3  HCT 35.0 - 45.0 % 36.3 38.6 38.2  PLT 140 - 400 Thousand/uL 269 240 239  NEUTROABS 1,500 - 7,800 cells/uL 7,102 - -  LYMPHSABS 850 - 3,900 cells/uL 828(L) - -     There is no height or weight on file to calculate BMI.  Orders:  Orders Placed This Encounter  Procedures   XR Knee 1-2 Views Left   No orders of the defined types were placed in this encounter.     Procedures: No procedures performed  Clinical Data: No additional findings.  ROS:  All other systems negative, except as noted in the HPI. Review of Systems  Objective: Vital Signs: There were no vitals taken for this visit.  Specialty Comments:  No specialty comments available.  PMFS History: Patient Active Problem List   Diagnosis Date Noted   Arthritis of left knee 09/20/2020   Septic arthritis of knee, left ([place]) 09/17/2020   Cellulitis and abscess of lower extremity 09/11/2020   Debility 05/02/2020   Urinary incontinence 05/02/2020   Pain in left shin    Urinary frequency    Sleep disturbance    Pain    Anxiety state    Urinary urgency    Drug induced constipation    Post-operative pain    Benign essential HTN    Pruritus    Left medial tibial plateau fracture 03/01/2020   Morbid obesity ([place])    Acute blood loss anemia    Postoperative pain    Tibial plateau fracture 02/25/2020   Tibial plateau fracture, left 02/25/2020   Hyponatremia 02/25/2020   Alcohol use disorder, moderate, dependence ([place]) 02/25/2020   Essential hypertension 02/25/2020   Past Medical History:  Diagnosis Date   Anxiety    Arthritis    Depression     History reviewed. No pertinent family history.  Past Surgical History:  Procedure Laterality Date   ABDOMINAL HYSTERECTOMY     BREAST SURGERY     left lumpectomy    gastric bypass surgery      HARDWARE REMOVAL Left 08/28/2020   Procedure: HARDWARE REMOVAL;  Surgeon: Hiram Gash, MD;  Location: [facility];  Service: Orthopedics;  Laterality: Left;   hemorrhjoid surgery      IRRIGATION AND DEBRIDEMENT ABSCESS Left 09/11/2020   Procedure: Irrigation and debridement of deep abscess- left leg, septic joint;  Surgeon: Hiram Gash, MD;  Location: [facility];  Service: Orthopedics;  Laterality: Left;   KNEE ARTHROSCOPY WITH MEDIAL MENISECTOMY Left 08/28/2020   Procedure: KNEE ARTHROSCOPY WITH CHONDROPLASTY, MANIPULATION WITH LYSIS OF ADHESIONS,  POSSIBLE MEDIAL MENISECTOMY;  Surgeon: Hiram Gash, MD;  Location: [facility];  Service: Orthopedics;  Laterality: Left;   left femur fracture surgery      x 2   ORIF TIBIA PLATEAU Left 02/26/2020   Procedure: OPEN REDUCTION INTERNAL FIXATION (ORIF) TIBIAL PLATEAU;  Surgeon: Shona Needles, MD;  Location: [facility];  Service: Orthopedics;  Laterality: Left;   TUBAL LIGATION     Social History   Occupational History   Not on file  Tobacco Use   Smoking status: Every Day    Pack years: 0.00    Types: Cigars   Smokeless tobacco: Never  Vaping Use   Vaping Use: Former  Substances: Nicotine, Flavoring  Substance and Sexual Activity   Alcohol use: Yes    Comment: 12 beers per week    Drug use: Never   Sexual activity: Not on file

## 2020-10-17 ENCOUNTER — Other Ambulatory Visit (HOSPITAL_COMMUNITY)
Admission: RE | Admit: 2020-10-17 | Discharge: 2020-10-17 | Disposition: A | Discharge status: Home / Self Care | Payer: BC Managed Care – PPO | Source: Ambulatory Visit | Attending: Orthopedic Surgery | Admitting: Orthopedic Surgery

## 2020-10-17 ENCOUNTER — Telehealth: Payer: Self-pay | Admitting: Orthopedic Surgery

## 2020-10-17 ENCOUNTER — Other Ambulatory Visit: Payer: Self-pay

## 2020-10-17 ENCOUNTER — Other Ambulatory Visit: Payer: Self-pay | Admitting: Physician Assistant

## 2020-10-17 ENCOUNTER — Encounter (HOSPITAL_COMMUNITY): Payer: Self-pay | Admitting: Orthopedic Surgery

## 2020-10-17 DIAGNOSIS — Z20822 Contact with and (suspected) exposure to covid-19: Secondary | ICD-10-CM | POA: Insufficient documentation

## 2020-10-17 DIAGNOSIS — Z01812 Encounter for preprocedural laboratory examination: Secondary | ICD-10-CM | POA: Insufficient documentation

## 2020-10-17 LAB — SARS CORONAVIRUS 2 (TAT 6-24 HRS): SARS Coronavirus 2: NEGATIVE

## 2020-10-17 MED ORDER — ALPRAZOLAM 0.5 MG PO TABS: 0.5000 mg | ORAL_TABLET | Freq: Three times a day (TID) | ORAL | 0 refills | Status: DC | PRN

## 2020-10-17 NOTE — Telephone Encounter (Signed)
Pt sch for left AKA tomorrow please see below and advise.

## 2020-10-17 NOTE — Telephone Encounter (Signed)
I spoke with the patient by phone.  She is very anxious about tomorrow but wishes to proceed.  She is just wondering if she can have a medication to help calm her down.  She has taken Xanax in the past and it works well for her.  I called in a prescription for it and told her to make sure she does not take anything after midnight unless instructed by preop

## 2020-10-17 NOTE — Telephone Encounter (Signed)
Pt called stating she is scheduled for an amputation surgery on 10/18/20 and she's very anxious about it. Pt would like to know if there's anything that can be called in to help her calm down? Pt would like a CB with an answer.   [phone]

## 2020-10-17 NOTE — Progress Notes (Signed)
PCP - Dr. Doreene Nest Cardiologist -  EKG - 09/19/20 Chest x-ray -  ECHO -  Cardiac Cath -  CPAP -    Blood Thinner Instructions: Follow your surgeon's instructions on when to stop Lovenox.  If no instructions were given by your surgeon then you will need to call the office to get those instructions.    Aspirin Instructions:   ERAS Protcol - 04:30  COVID TEST- 6/30  Anesthesia review: n/a  -------------  SDW INSTRUCTIONS:  Your procedure is scheduled on Friday 7/1. Please report to [facility] Main Entrance "A" at 05:30 A.M., and check in at the Admitting office. Call this number if you have problems the morning of surgery: [phone]   Remember: Do not eat after midnight the night before your surgery  You may drink clear liquids until 04:30AM the morning of your surgery.   Clear liquids allowed are: Water, Non-Citrus Juices (without pulp), Carbonated Beverages, Clear Tea, Black Coffee Only, and Gatorade   Medications to take morning of surgery with a sip of water include: ARIPiprazole (ABILIFY)  gabapentin (NEURONTIN)  If needed: cyclobenzaprine (FLEXERIL) oxyCODONE    As of today, STOP taking any Aspirin (unless otherwise instructed by your surgeon), Aleve, Naproxen, Ibuprofen, Motrin, Advil, Goody's, BC's, all herbal medications, fish oil, and all vitamins.    The Morning of Surgery Do not wear jewelry, make-up or nail polish. Do not wear lotions, powders, or perfumes, or deodorant Do not shave 48 hours prior to surgery.   Do not bring valuables to the hospital. [facility] is not responsible for any belongings or valuables.  If you are a smoker, DO NOT Smoke 24 hours prior to surgery If you wear a CPAP at night please bring your mask the morning of surgery  Remember that you must have someone to transport you home after your surgery, and remain with you for 24 hours if you are discharged the same day.  Please bring cases for contacts, glasses, hearing aids,  dentures or bridgework because it cannot be worn into surgery.   Patients discharged the day of surgery will not be allowed to drive home.   Please shower the NIGHT BEFORE/MORNING OF SURGERY (use antibacterial soap like DIAL soap if possible). Wear comfortable clothes the morning of surgery. Oral Hygiene is also important to reduce your risk of infection.  Remember - BRUSH YOUR TEETH THE MORNING OF SURGERY WITH YOUR REGULAR TOOTHPASTE  Patient denies shortness of breath, fever, cough and chest pain.

## 2020-10-18 ENCOUNTER — Encounter (HOSPITAL_COMMUNITY)
Admission: RE | Disposition: A | Discharge status: Skilled Nursing Facility | Payer: Self-pay | Source: Home / Self Care | Attending: Orthopedic Surgery

## 2020-10-18 ENCOUNTER — Inpatient Hospital Stay (HOSPITAL_COMMUNITY)
Admission: RE | Admit: 2020-10-18 | Discharge: 2020-10-24 | DRG: 475 | Disposition: A | Discharge status: Skilled Nursing Facility | Payer: BC Managed Care – PPO | Source: Ambulatory Visit | Attending: Orthopedic Surgery | Admitting: Orthopedic Surgery

## 2020-10-18 ENCOUNTER — Ambulatory Visit (HOSPITAL_COMMUNITY): Payer: BC Managed Care – PPO | Admitting: Anesthesiology

## 2020-10-18 DIAGNOSIS — Z885 Allergy status to narcotic agent status: Secondary | ICD-10-CM

## 2020-10-18 DIAGNOSIS — L03116 Cellulitis of left lower limb: Secondary | ICD-10-CM | POA: Diagnosis present

## 2020-10-18 DIAGNOSIS — K219 Gastro-esophageal reflux disease without esophagitis: Secondary | ICD-10-CM | POA: Diagnosis present

## 2020-10-18 DIAGNOSIS — Z20822 Contact with and (suspected) exposure to covid-19: Secondary | ICD-10-CM | POA: Diagnosis present

## 2020-10-18 DIAGNOSIS — M86462 Chronic osteomyelitis with draining sinus, left tibia and fibula: Principal | ICD-10-CM

## 2020-10-18 DIAGNOSIS — Z90710 Acquired absence of both cervix and uterus: Secondary | ICD-10-CM | POA: Diagnosis not present

## 2020-10-18 DIAGNOSIS — Z6841 Body Mass Index (BMI) 40.0 and over, adult: Secondary | ICD-10-CM

## 2020-10-18 DIAGNOSIS — Z79899 Other long term (current) drug therapy: Secondary | ICD-10-CM | POA: Diagnosis not present

## 2020-10-18 DIAGNOSIS — F17290 Nicotine dependence, other tobacco product, uncomplicated: Secondary | ICD-10-CM | POA: Diagnosis present

## 2020-10-18 DIAGNOSIS — Z9884 Bariatric surgery status: Secondary | ICD-10-CM

## 2020-10-18 DIAGNOSIS — G8929 Other chronic pain: Secondary | ICD-10-CM | POA: Diagnosis present

## 2020-10-18 DIAGNOSIS — M869 Osteomyelitis, unspecified: Secondary | ICD-10-CM | POA: Diagnosis present

## 2020-10-18 HISTORY — PX: AMPUTATION: SHX166

## 2020-10-18 LAB — TYPE AND SCREEN
ABO/RH(D): A POS
Antibody Screen: NEGATIVE

## 2020-10-18 SURGERY — AMPUTATION, ABOVE KNEE
Anesthesia: General | Site: Knee | Laterality: Left

## 2020-10-18 MED ORDER — FENTANYL CITRATE (PF) 250 MCG/5ML IJ SOLN
INTRAMUSCULAR | Status: AC
  Filled 2020-10-18: qty 5

## 2020-10-18 MED ORDER — POLYETHYLENE GLYCOL 3350 17 G PO PACK
17.0000 g | PACK | Freq: Every day | ORAL | Status: DC | PRN
  Administered 2020-10-24: 17 g via ORAL

## 2020-10-18 MED ORDER — SODIUM CHLORIDE 0.9 % IV SOLN: INTRAVENOUS | Status: DC

## 2020-10-18 MED ORDER — MIDAZOLAM HCL 2 MG/2ML IJ SOLN
INTRAMUSCULAR | Status: AC
  Filled 2020-10-18: qty 2

## 2020-10-18 MED ORDER — TRANEXAMIC ACID-NACL 1000-0.7 MG/100ML-% IV SOLN
1000.0000 mg | Freq: Once | INTRAVENOUS | Status: AC
  Administered 2020-10-18: 1000 mg via INTRAVENOUS
  Filled 2020-10-18: qty 100

## 2020-10-18 MED ORDER — LORATADINE 10 MG PO TABS
10.0000 mg | ORAL_TABLET | Freq: Every day | ORAL | Status: DC
  Administered 2020-10-18 – 2020-10-24 (×7): 10 mg via ORAL
  Filled 2020-10-18 (×7): qty 1

## 2020-10-18 MED ORDER — ASCORBIC ACID 500 MG PO TABS
1000.0000 mg | ORAL_TABLET | Freq: Every day | ORAL | Status: DC
  Administered 2020-10-18 – 2020-10-24 (×7): 1000 mg via ORAL
  Filled 2020-10-18 (×7): qty 2

## 2020-10-18 MED ORDER — CYCLOBENZAPRINE HCL 10 MG PO TABS
ORAL_TABLET | ORAL | Status: AC
  Filled 2020-10-18: qty 1

## 2020-10-18 MED ORDER — PROPOFOL 10 MG/ML IV BOLUS
INTRAVENOUS | Status: AC
  Filled 2020-10-18: qty 40

## 2020-10-18 MED ORDER — OXYCODONE HCL 5 MG PO TABS
ORAL_TABLET | ORAL | Status: AC
  Filled 2020-10-18: qty 1

## 2020-10-18 MED ORDER — ONDANSETRON HCL 4 MG/2ML IJ SOLN: 4.0000 mg | Freq: Four times a day (QID) | INTRAMUSCULAR | Status: DC | PRN

## 2020-10-18 MED ORDER — HYDROMORPHONE HCL 1 MG/ML IJ SOLN
0.5000 mg | INTRAMUSCULAR | Status: DC | PRN
  Administered 2020-10-18 – 2020-10-21 (×7): 0.5 mg via INTRAVENOUS
  Filled 2020-10-18 (×7): qty 0.5

## 2020-10-18 MED ORDER — GUAIFENESIN-DM 100-10 MG/5ML PO SYRP: 15.0000 mL | ORAL_SOLUTION | ORAL | Status: DC | PRN

## 2020-10-18 MED ORDER — PANTOPRAZOLE SODIUM 40 MG PO TBEC
40.0000 mg | DELAYED_RELEASE_TABLET | Freq: Every day | ORAL | Status: DC
  Administered 2020-10-18 – 2020-10-24 (×7): 40 mg via ORAL
  Filled 2020-10-18 (×7): qty 1

## 2020-10-18 MED ORDER — ROCURONIUM BROMIDE 10 MG/ML (PF) SYRINGE
PREFILLED_SYRINGE | INTRAVENOUS | Status: DC | PRN
  Administered 2020-10-18: 100 mg via INTRAVENOUS

## 2020-10-18 MED ORDER — CHLORHEXIDINE GLUCONATE 0.12 % MT SOLN
15.0000 mL | Freq: Once | OROMUCOSAL | Status: AC
  Administered 2020-10-18: 15 mL via OROMUCOSAL
  Filled 2020-10-18: qty 15

## 2020-10-18 MED ORDER — 0.9 % SODIUM CHLORIDE (POUR BTL) OPTIME
TOPICAL | Status: DC | PRN
  Administered 2020-10-18: 1000 mL

## 2020-10-18 MED ORDER — BISACODYL 5 MG PO TBEC: 5.0000 mg | DELAYED_RELEASE_TABLET | Freq: Every day | ORAL | Status: DC | PRN

## 2020-10-18 MED ORDER — ROCURONIUM BROMIDE 10 MG/ML (PF) SYRINGE
PREFILLED_SYRINGE | INTRAVENOUS | Status: AC
  Filled 2020-10-18: qty 10

## 2020-10-18 MED ORDER — OXYCODONE HCL 5 MG PO TABS
5.0000 mg | ORAL_TABLET | ORAL | Status: DC | PRN
  Administered 2020-10-18: 10 mg via ORAL
  Administered 2020-10-18: 5 mg via ORAL
  Administered 2020-10-19 – 2020-10-24 (×21): 10 mg via ORAL
  Filled 2020-10-18 (×22): qty 2

## 2020-10-18 MED ORDER — VITAMIN D 25 MCG (1000 UNIT) PO TABS
1000.0000 [IU] | ORAL_TABLET | Freq: Every day | ORAL | Status: DC
  Administered 2020-10-18 – 2020-10-24 (×7): 1000 [IU] via ORAL
  Filled 2020-10-18 (×7): qty 1

## 2020-10-18 MED ORDER — CELECOXIB 200 MG PO CAPS
200.0000 mg | ORAL_CAPSULE | Freq: Two times a day (BID) | ORAL | Status: DC
  Administered 2020-10-18 – 2020-10-24 (×12): 200 mg via ORAL
  Filled 2020-10-18 (×12): qty 1

## 2020-10-18 MED ORDER — KETOROLAC TROMETHAMINE 15 MG/ML IJ SOLN: 7.5000 mg | Freq: Four times a day (QID) | INTRAMUSCULAR | Status: DC

## 2020-10-18 MED ORDER — POTASSIUM CHLORIDE CRYS ER 20 MEQ PO TBCR
20.0000 meq | EXTENDED_RELEASE_TABLET | Freq: Every day | ORAL | Status: DC
  Administered 2020-10-18 – 2020-10-24 (×7): 20 meq via ORAL
  Filled 2020-10-18 (×7): qty 1

## 2020-10-18 MED ORDER — DIPHENHYDRAMINE HCL 25 MG PO CAPS: 25.0000 mg | ORAL_CAPSULE | Freq: Four times a day (QID) | ORAL | Status: DC | PRN

## 2020-10-18 MED ORDER — ONDANSETRON HCL 4 MG/2ML IJ SOLN
INTRAMUSCULAR | Status: AC
  Filled 2020-10-18: qty 2

## 2020-10-18 MED ORDER — DOCUSATE SODIUM 100 MG PO CAPS
100.0000 mg | ORAL_CAPSULE | Freq: Every day | ORAL | Status: DC
  Administered 2020-10-19 – 2020-10-24 (×6): 100 mg via ORAL
  Filled 2020-10-18 (×6): qty 1

## 2020-10-18 MED ORDER — CELECOXIB 200 MG PO CAPS
ORAL_CAPSULE | ORAL | Status: AC
  Filled 2020-10-18: qty 1

## 2020-10-18 MED ORDER — GABAPENTIN 400 MG PO CAPS
800.0000 mg | ORAL_CAPSULE | Freq: Three times a day (TID) | ORAL | Status: DC
  Administered 2020-10-18 – 2020-10-24 (×18): 800 mg via ORAL
  Filled 2020-10-18 (×19): qty 2

## 2020-10-18 MED ORDER — CEFAZOLIN IN SODIUM CHLORIDE 3-0.9 GM/100ML-% IV SOLN
3.0000 g | INTRAVENOUS | Status: AC
  Administered 2020-10-18: 3 g via INTRAVENOUS
  Filled 2020-10-18: qty 100

## 2020-10-18 MED ORDER — KETOROLAC TROMETHAMINE 15 MG/ML IJ SOLN
7.5000 mg | Freq: Four times a day (QID) | INTRAMUSCULAR | Status: AC
  Administered 2020-10-18 – 2020-10-22 (×13): 7.5 mg via INTRAVENOUS
  Filled 2020-10-18 (×13): qty 1

## 2020-10-18 MED ORDER — CEFAZOLIN SODIUM-DEXTROSE 2-4 GM/100ML-% IV SOLN
2.0000 g | Freq: Three times a day (TID) | INTRAVENOUS | Status: AC
Start: 2020-10-18 — End: 2020-10-18
  Administered 2020-10-18 (×2): 2 g via INTRAVENOUS
  Filled 2020-10-18 (×2): qty 100

## 2020-10-18 MED ORDER — ACETAMINOPHEN 500 MG PO TABS
1000.0000 mg | ORAL_TABLET | Freq: Once | ORAL | Status: AC
  Administered 2020-10-18: 1000 mg via ORAL
  Filled 2020-10-18: qty 2

## 2020-10-18 MED ORDER — KETOROLAC TROMETHAMINE 15 MG/ML IJ SOLN
INTRAMUSCULAR | Status: AC
  Administered 2020-10-18: 7.5 mg via INTRAVENOUS
  Filled 2020-10-18: qty 1

## 2020-10-18 MED ORDER — FENTANYL CITRATE (PF) 100 MCG/2ML IJ SOLN
INTRAMUSCULAR | Status: AC
  Administered 2020-10-18: 50 ug via INTRAVENOUS
  Filled 2020-10-18: qty 2

## 2020-10-18 MED ORDER — ARIPIPRAZOLE 5 MG PO TABS
5.0000 mg | ORAL_TABLET | Freq: Every day | ORAL | Status: DC
  Administered 2020-10-18 – 2020-10-24 (×7): 5 mg via ORAL
  Filled 2020-10-18 (×7): qty 1

## 2020-10-18 MED ORDER — ORAL CARE MOUTH RINSE: 15.0000 mL | Freq: Once | OROMUCOSAL | Status: AC

## 2020-10-18 MED ORDER — LIDOCAINE 2% (20 MG/ML) 5 ML SYRINGE
INTRAMUSCULAR | Status: AC
  Filled 2020-10-18: qty 5

## 2020-10-18 MED ORDER — FENTANYL CITRATE (PF) 250 MCG/5ML IJ SOLN
INTRAMUSCULAR | Status: DC | PRN
  Administered 2020-10-18 (×3): 50 ug via INTRAVENOUS

## 2020-10-18 MED ORDER — JUVEN PO PACK
1.0000 | PACK | Freq: Two times a day (BID) | ORAL | Status: DC
  Administered 2020-10-19: 1 via ORAL
  Filled 2020-10-18 (×3): qty 1

## 2020-10-18 MED ORDER — MAGNESIUM CITRATE PO SOLN: 1.0000 | Freq: Once | ORAL | Status: DC | PRN

## 2020-10-18 MED ORDER — CYCLOBENZAPRINE HCL 10 MG PO TABS
10.0000 mg | ORAL_TABLET | Freq: Three times a day (TID) | ORAL | Status: DC | PRN
  Administered 2020-10-18: 10 mg via ORAL

## 2020-10-18 MED ORDER — HYDROMORPHONE HCL 1 MG/ML IJ SOLN: 0.5000 mg | INTRAMUSCULAR | Status: DC | PRN

## 2020-10-18 MED ORDER — FUROSEMIDE 20 MG PO TABS
20.0000 mg | ORAL_TABLET | Freq: Every day | ORAL | Status: DC
  Administered 2020-10-18 – 2020-10-24 (×7): 20 mg via ORAL
  Filled 2020-10-18 (×7): qty 1

## 2020-10-18 MED ORDER — ACETAMINOPHEN 325 MG PO TABS
325.0000 mg | ORAL_TABLET | Freq: Four times a day (QID) | ORAL | Status: DC | PRN
  Administered 2020-10-19 – 2020-10-23 (×4): 650 mg via ORAL
  Filled 2020-10-18 (×4): qty 2

## 2020-10-18 MED ORDER — PROPOFOL 10 MG/ML IV BOLUS
INTRAVENOUS | Status: DC | PRN
  Administered 2020-10-18: 30 mg via INTRAVENOUS
  Administered 2020-10-18: 150 mg via INTRAVENOUS

## 2020-10-18 MED ORDER — LACTATED RINGERS IV SOLN: INTRAVENOUS | Status: DC

## 2020-10-18 MED ORDER — LIDOCAINE 2% (20 MG/ML) 5 ML SYRINGE
INTRAMUSCULAR | Status: DC | PRN
  Administered 2020-10-18: 100 mg via INTRAVENOUS

## 2020-10-18 MED ORDER — PHENOL 1.4 % MT LIQD: 1.0000 | OROMUCOSAL | Status: DC | PRN

## 2020-10-18 MED ORDER — ALPRAZOLAM 0.5 MG PO TABS
0.5000 mg | ORAL_TABLET | Freq: Three times a day (TID) | ORAL | Status: DC | PRN
  Administered 2020-10-19 – 2020-10-24 (×8): 0.5 mg via ORAL
  Filled 2020-10-18 (×9): qty 1

## 2020-10-18 MED ORDER — ALUM & MAG HYDROXIDE-SIMETH 200-200-20 MG/5ML PO SUSP: 15.0000 mL | ORAL | Status: DC | PRN

## 2020-10-18 MED ORDER — ZINC SULFATE 220 (50 ZN) MG PO CAPS
220.0000 mg | ORAL_CAPSULE | Freq: Every day | ORAL | Status: DC
  Administered 2020-10-18 – 2020-10-24 (×6): 220 mg via ORAL
  Filled 2020-10-18 (×7): qty 1

## 2020-10-18 MED ORDER — SUGAMMADEX SODIUM 200 MG/2ML IV SOLN
INTRAVENOUS | Status: DC | PRN
  Administered 2020-10-18 (×2): 200 mg via INTRAVENOUS

## 2020-10-18 MED ORDER — ONDANSETRON HCL 4 MG/2ML IJ SOLN
INTRAMUSCULAR | Status: DC | PRN
  Administered 2020-10-18: 4 mg via INTRAVENOUS

## 2020-10-18 MED ORDER — FENTANYL CITRATE (PF) 100 MCG/2ML IJ SOLN
25.0000 ug | INTRAMUSCULAR | Status: DC | PRN
  Administered 2020-10-18: 50 ug via INTRAVENOUS

## 2020-10-18 MED ORDER — FAMOTIDINE 20 MG PO TABS
20.0000 mg | ORAL_TABLET | Freq: Every day | ORAL | Status: DC | PRN
  Administered 2020-10-20: 20 mg via ORAL
  Filled 2020-10-18: qty 1

## 2020-10-18 SURGICAL SUPPLY — 42 items
BAG COUNTER SPONGE SURGICOUNT (BAG) IMPLANT
BLADE SAW RECIP 87.9 MT (BLADE) ×2 IMPLANT
BLADE SURG 21 STRL SS (BLADE) ×2 IMPLANT
BNDG COHESIVE 6X5 TAN STRL LF (GAUZE/BANDAGES/DRESSINGS) ×4 IMPLANT
CANISTER WOUND CARE 500ML ATS (WOUND CARE) IMPLANT
CANISTER WOUNDNEG PRESSURE 500 (CANNISTER) ×2 IMPLANT
COVER SURGICAL LIGHT HANDLE (MISCELLANEOUS) ×2 IMPLANT
CUFF TOURN SGL QUICK 34 (TOURNIQUET CUFF)
CUFF TRNQT CYL 34X4.125X (TOURNIQUET CUFF) IMPLANT
DRAPE DERMATAC (DRAPES) ×8 IMPLANT
DRAPE INCISE IOBAN 66X45 STRL (DRAPES) IMPLANT
DRAPE U-SHAPE 47X51 STRL (DRAPES) ×2 IMPLANT
DRESSING PREVENA PLUS CUSTOM (GAUZE/BANDAGES/DRESSINGS) ×2 IMPLANT
DRSG PREVENA PLUS CUSTOM (GAUZE/BANDAGES/DRESSINGS) ×4
DURAPREP 26ML APPLICATOR (WOUND CARE) ×2 IMPLANT
ELECT REM PT RETURN 9FT ADLT (ELECTROSURGICAL) ×2
ELECTRODE REM PT RTRN 9FT ADLT (ELECTROSURGICAL) ×1 IMPLANT
GLOVE SURG ORTHO LTX SZ9 (GLOVE) ×2 IMPLANT
GLOVE SURG POLYISO LF SZ6.5 (GLOVE) ×2 IMPLANT
GLOVE SURG UNDER POLY LF SZ7.5 (GLOVE) ×2 IMPLANT
GLOVE SURG UNDER POLY LF SZ9 (GLOVE) ×2 IMPLANT
GOWN STRL REUS W/ TWL LRG LVL3 (GOWN DISPOSABLE) ×1 IMPLANT
GOWN STRL REUS W/ TWL XL LVL3 (GOWN DISPOSABLE) ×2 IMPLANT
GOWN STRL REUS W/TWL LRG LVL3 (GOWN DISPOSABLE) ×2
GOWN STRL REUS W/TWL XL LVL3 (GOWN DISPOSABLE) ×4
KIT BASIN OR (CUSTOM PROCEDURE TRAY) ×2 IMPLANT
KIT TURNOVER KIT B (KITS) ×2 IMPLANT
MANIFOLD NEPTUNE II (INSTRUMENTS) ×2 IMPLANT
NS IRRIG 1000ML POUR BTL (IV SOLUTION) ×2 IMPLANT
PACK ORTHO EXTREMITY (CUSTOM PROCEDURE TRAY) ×2 IMPLANT
PAD ARMBOARD 7.5X6 YLW CONV (MISCELLANEOUS) ×2 IMPLANT
PREVENA RESTOR ARTHOFORM 33X30 (CANNISTER) ×2 IMPLANT
PREVENA RESTOR ARTHOFORM 46X30 (CANNISTER) ×2 IMPLANT
SPONGE T-LAP 18X18 ~~LOC~~+RFID (SPONGE) ×4 IMPLANT
STAPLER VISISTAT 35W (STAPLE) ×2 IMPLANT
STOCKINETTE IMPERVIOUS LG (DRAPES) IMPLANT
SUT ETHILON 2 0 PSLX (SUTURE) ×6 IMPLANT
SUT SILK 2 0 (SUTURE) ×2
SUT SILK 2-0 18XBRD TIE 12 (SUTURE) ×1 IMPLANT
TOWEL GREEN STERILE FF (TOWEL DISPOSABLE) ×2 IMPLANT
TUBE CONNECTING 20X1/4 (TUBING) ×2 IMPLANT
YANKAUER SUCT BULB TIP NO VENT (SUCTIONS) ×2 IMPLANT

## 2020-10-18 NOTE — Transfer of Care (Signed)
Immediate Anesthesia Transfer of Care Note  [name]  Procedure(s) Performed: LEFT ABOVE KNEE AMPUTATION (Left: Knee)  Patient Location: PACU  Anesthesia Type:General  Level of Consciousness: awake, alert  and oriented  Airway & Oxygen Therapy: Patient Spontanous Breathing and Patient connected to face mask oxygen  Post-op Assessment: Report given to RN and Post -op Vital signs reviewed and stable  Post vital signs: Reviewed and stable  Last Vitals:  Vitals Value Taken Time  BP 167/69 10/18/20 0904  Temp    Pulse 76 10/18/20 0909  Resp 16 10/18/20 0909  SpO2 100 % 10/18/20 0909  Vitals shown include unvalidated device data.  Last Pain:  Vitals:   10/18/20 0652  TempSrc:   PainSc: 0-No pain         Complications: No notable events documented.

## 2020-10-18 NOTE — Op Note (Signed)
10/18/2020  9:03 AM  PATIENT:  [name]    PRE-OPERATIVE DIAGNOSIS:  Osteomyelitis Left Tibia  POST-OPERATIVE DIAGNOSIS:  Same  PROCEDURE:  LEFT ABOVE KNEE AMPUTATION  SURGEON:  Newt Minion, MD  PHYSICIAN ASSISTANT:None ANESTHESIA:   General  PREOPERATIVE INDICATIONS:  [name] is a  65 y.o. female with a diagnosis of Osteomyelitis Left Tibia who failed conservative measures and elected for surgical management.    The risks benefits and alternatives were discussed with the patient preoperatively including but not limited to the risks of infection, bleeding, nerve injury, cardiopulmonary complications, the need for revision surgery, among others, and the patient was willing to proceed.  OPERATIVE IMPLANTS: Praveena customizable and Arthur form wound VAC sponges  @ENCIMAGES @  OPERATIVE FINDINGS: Thin atrophic muscles extensive adipose tissue envelope amputation just distal to the femoral nail no signs of infection at the resection margins  OPERATIVE PROCEDURE: Patient was brought the operating room and underwent a general anesthetic.  After adequate levels anesthesia were obtained patient's left lower extremity was prepped using DuraPrep draped into a sterile field a timeout was called.  A fishmouth incision was made just proximal to the patella.  This was carried down to the medial vascular bundle this was clamped and suture ligated with 2-0 silk.  The femoral cut was made just distal to the femoral nail.  The remainder of the amputation was completed electrocardio was used for hemostasis.  The deep fascial layers were closed using interrupted #1 Vicryl the skin and superficial fascial layers was closed with 2-0 nylon skin was closed using staples a Praveena customizable and Arthur form wound VAC was applied this had a good suction fit patient was extubated taken the PACU in stable condition   DISCHARGE PLANNING:  Antibiotic duration: 24-hour antibiotics  Weightbearing:  Nonweightbearing on the left  Pain medication: Opioid pathway  Dressing care/ Wound VAC: Continue wound VAC for 1 week  Ambulatory devices: Walker  Discharge to: Anticipate discharge to skilled nursing.  Follow-up: In the office 1 week post operative.

## 2020-10-18 NOTE — Progress Notes (Signed)
Pt arrived from PACU to 5N. Pt on 2L O2 and continuous pulse ox. SCD on right leg, Wound vac functioning and plugged into wall. Pt alert and oriented but slightly drowsy and intermittently confused from pain meds. Call bell within reach.

## 2020-10-18 NOTE — Anesthesia Preprocedure Evaluation (Addendum)
Anesthesia Evaluation  Patient identified by MRN, date of birth, ID band Patient awake    Reviewed: Allergy & Precautions, NPO status , Patient's Chart, lab work & pertinent test results  Airway Mallampati: III  TM Distance: >3 FB Neck ROM: Full  Mouth opening: Limited Mouth Opening  Dental  (+) Loose, Dental Advisory Given,    Pulmonary neg pulmonary ROS, Current SmokerPatient did not abstain from smoking.,    Pulmonary exam normal breath sounds clear to auscultation       Cardiovascular hypertension, Normal cardiovascular exam Rhythm:Regular Rate:Normal     Neuro/Psych PSYCHIATRIC DISORDERS Anxiety Depression negative neurological ROS     GI/Hepatic GERD  Controlled,(+)     substance abuse  alcohol use,   Endo/Other  Morbid obesity (BMI 50)  Renal/GU negative Renal ROS  negative genitourinary   Musculoskeletal  (+) Arthritis ,   Abdominal   Peds  Hematology negative hematology ROS (+)   Anesthesia Other Findings lovenox?  Reproductive/Obstetrics                            Anesthesia Physical Anesthesia Plan  ASA: 3  Anesthesia Plan: General   Post-op Pain Management:    Induction: Intravenous  PONV Risk Score and Plan: 2 and Midazolam, Dexamethasone and Ondansetron  Airway Management Planned: Oral ETT  Additional Equipment:   Intra-op Plan:   Post-operative Plan: Extubation in OR  Informed Consent: I have reviewed the patients History and Physical, chart, labs and discussed the procedure including the risks, benefits and alternatives for the proposed anesthesia with the patient or authorized representative who has indicated his/her understanding and acceptance.     Dental advisory given  Plan Discussed with: CRNA  Anesthesia Plan Comments:         Anesthesia Quick Evaluation

## 2020-10-18 NOTE — Anesthesia Postprocedure Evaluation (Signed)
Anesthesia Post Note  [name]  Procedure(s) Performed: LEFT ABOVE KNEE AMPUTATION (Left: Knee)     Patient location during evaluation: PACU Anesthesia Type: General Level of consciousness: awake and alert Pain management: pain level controlled Vital Signs Assessment: post-procedure vital signs reviewed and stable Respiratory status: spontaneous breathing, nonlabored ventilation, respiratory function stable and patient connected to nasal cannula oxygen Cardiovascular status: blood pressure returned to baseline and stable Postop Assessment: no apparent nausea or vomiting Anesthetic complications: no   No notable events documented.  Last Vitals:  Vitals:   10/18/20 1020 10/18/20 1035  BP: (!) 162/78 (!) 160/72  Pulse: 89 89  Resp: 16 15  Temp:  36.7 C  SpO2: 96% 95%    Last Pain:  Vitals:   10/18/20 1035  TempSrc:   PainSc: Asleep                 Shacara Cozine L [facility]

## 2020-10-18 NOTE — Progress Notes (Signed)
Orthopedic Tech Progress Note Patient Details:  [name] [birth year] [id number] Called [facility] for left shrinker. Patient ID: [name], female   DOB: February 29, 1956, 65 y.o.   MRN: [id number]  [name] 10/18/2020, 12:22 PM

## 2020-10-18 NOTE — TOC Progression Note (Signed)
Transition of Care [facility]) - Progression Note    Patient Details  Name: [name] MRN: [id number] Date of Birth: 07-02-1955  Transition of Care [facility]) CM/SW Contact  Milinda Antis, [place] Phone Number: 10/18/2020, 1:56 PM  Clinical Narrative:    LEFT ABOVE KNEE AMPUTATION  CSW following patient for any d/c planning needs once medically stable.  Surgery was today.  Pending PT/OT recommendations  Lind Covert, MSW, LCSWA         Expected Discharge Plan and Services                                                 Social Determinants of Health (SDOH) Interventions    Readmission Risk Interventions Readmission Risk Prevention Plan 02/27/2020  Post Dischage Appt Complete  Medication Screening Complete  Transportation Screening Complete  Some recent data might be hidden

## 2020-10-18 NOTE — Anesthesia Procedure Notes (Signed)
Procedure Name: Intubation Date/Time: 10/18/2020 7:58 AM Performed by: Trinna Post., CRNA Pre-anesthesia Checklist: Patient identified, Emergency Drugs available, Suction available, Patient being monitored and Timeout performed Patient Re-evaluated:Patient Re-evaluated prior to induction Oxygen Delivery Method: Circle system utilized Preoxygenation: Pre-oxygenation with 100% oxygen Induction Type: IV induction Ventilation: Mask ventilation without difficulty and Oral airway inserted - appropriate to patient size Laryngoscope Size: Glidescope and 3 Grade View: Grade I Tube type: Oral Tube size: 7.0 mm Number of attempts: 2 Airway Equipment and Method: Rigid stylet and Video-laryngoscopy Placement Confirmation: ETT inserted through vocal cords under direct vision, positive ETCO2 and breath sounds checked- equal and bilateral Secured at: 22 cm Tube secured with: Tape Dental Injury: Teeth and Oropharynx as per pre-operative assessment

## 2020-10-18 NOTE — Interval H&P Note (Signed)
History and Physical Interval Note:  10/18/2020 6:53 AM  [name]  has presented today for surgery, with the diagnosis of Osteomyelitis Left Tibia.  The various methods of treatment have been discussed with the patient and family. After consideration of risks, benefits and other options for treatment, the patient has consented to  Procedure(s): LEFT ABOVE KNEE AMPUTATION (Left) as a surgical intervention.  The patient's history has been reviewed, patient examined, no change in status, stable for surgery.  I have reviewed the patient's chart and labs.  Questions were answered to the patient's satisfaction.     Newt Minion

## 2020-10-18 NOTE — H&P (Signed)
[name] is an 65 y.o. female.   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  Patient initially underwent open reduction internal fixation with plates and screws of the medial tibial plateau fracture.  Patient subsequently developed infection hardware was removed and patient underwent 2 further surgeries in May for knee arthroscopy as well as irrigation and debridement of the deep abscess.  Patient has been on a prolonged course of IV antibiotics as well as oral antibiotics.  She is seen at this time for evaluation and referral from Dr. Griffin Basil with purulent drainage from the anterior incision and hard induration and cellulitis of the left leg.  Ultrasound has been negative for DVT.  Labs show a C-reactive protein of 42.9.  CBC shows a hemoglobin of 11.5 with a white blood cell count of 8.9 which is slowly increasing.  She has absolute neutrophil count of 7100.  With a neutrophil percentage of 80%.  Past Medical History:  Diagnosis Date   Anxiety    Arthritis    Depression     Past Surgical History:  Procedure Laterality Date   ABDOMINAL HYSTERECTOMY     BREAST SURGERY     left lumpectomy    gastric bypass surgery      HARDWARE REMOVAL Left 08/28/2020   Procedure: HARDWARE REMOVAL;  Surgeon: Hiram Gash, MD;  Location: [facility];  Service: Orthopedics;  Laterality: Left;   hemorrhjoid surgery      IRRIGATION AND DEBRIDEMENT ABSCESS Left 09/11/2020   Procedure: Irrigation and debridement of deep abscess- left leg, septic joint;  Surgeon: Hiram Gash, MD;  Location: [facility];  Service: Orthopedics;  Laterality: Left;   KNEE ARTHROSCOPY WITH MEDIAL MENISECTOMY Left 08/28/2020   Procedure: KNEE ARTHROSCOPY WITH CHONDROPLASTY, MANIPULATION WITH LYSIS OF ADHESIONS, POSSIBLE MEDIAL MENISECTOMY;  Surgeon: Hiram Gash, MD;  Location: [facility];  Service: Orthopedics;  Laterality: Left;   left femur  fracture surgery      x 2   ORIF TIBIA PLATEAU Left 02/26/2020   Procedure: OPEN REDUCTION INTERNAL FIXATION (ORIF) TIBIAL PLATEAU;  Surgeon: Shona Needles, MD;  Location: [facility];  Service: Orthopedics;  Laterality: Left;   TUBAL LIGATION      History reviewed. No pertinent family history. Social History:  reports that she has been smoking cigars. She has never used smokeless tobacco. She reports current alcohol use of about 18.0 standard drinks of alcohol per week. She reports that she does not use drugs.  Allergies:  Allergies  Allergen Reactions   Morphine And Related Itching    Tolerates fine with antihistamine (benadryl or cetirizine)    Oxycodone Itching    Tolerates fine with antihistamine (benadryl or cetirizine)     Medications Prior to Admission  Medication Sig Dispense Refill   acetaminophen (TYLENOL) 500 MG tablet Take 1,000 mg by mouth every 6 (six) hours as needed for moderate pain.     ARIPiprazole (ABILIFY) 5 MG tablet Take 1 tablet (5 mg total) by mouth daily. 30 tablet    celecoxib (CELEBREX) 200 MG capsule Take 200 mg by mouth 2 (two) times daily.     cetirizine (ZYRTEC) 10 MG tablet Take 10 mg by mouth daily as needed for allergies.     cholecalciferol (VITAMIN D3) 25 MCG (1000 UNIT) tablet Take 1,000 Units by mouth daily.     cyclobenzaprine (FLEXERIL) 10 MG tablet Take 1 tablet (10 mg total)  by mouth 3 (three) times daily as needed for muscle spasms. 30 tablet 0   docusate sodium (COLACE) 100 MG capsule Take 100 mg by mouth daily as needed for mild constipation.     doxycycline (VIBRAMYCIN) 100 MG capsule Take 100 mg by mouth 2 (two) times daily.     enoxaparin (LOVENOX) 40 MG/0.4ML injection Inject 0.4 mLs (40 mg total) into the skin daily. 12 mL 0   famotidine (PEPCID) 20 MG tablet Take 20 mg by mouth daily as needed for heartburn or indigestion.     furosemide (LASIX) 20 MG tablet Take 20 mg by mouth daily.     gabapentin (NEURONTIN) 800 MG tablet Take 1  tablet (800 mg total) by mouth 3 (three) times daily. 90 tablet 0   liver oil-zinc oxide (DESITIN) 40 % ointment Apply 1 application topically daily as needed for irritation.     Melatonin 5 MG CAPS Take 5 mg by mouth at bedtime.     nystatin cream (MYCOSTATIN) Apply 1 application topically 3 (three) times daily as needed (irritation).     omeprazole (PRILOSEC) 20 MG capsule Take 20 mg by mouth daily.     oxyCODONE 10 MG TABS Take 1 pills every 6 hrs as needed for severe pain (Patient taking differently: Take 10 mg by mouth every 4 (four) hours as needed (pain).) 30 tablet 0   potassium chloride SA (KLOR-CON) 20 MEQ tablet Take 20 mEq by mouth daily.     ALPRAZolam (XANAX) 0.5 MG tablet Take 1 tablet (0.5 mg total) by mouth 3 (three) times daily as needed for sleep or anxiety. 10 tablet 0    Results for orders placed or performed during the hospital encounter of 10/17/20 (from the past 48 hour(s))  SARS CORONAVIRUS 2 (TAT 6-24 HRS) Nasopharyngeal Nasopharyngeal Swab     Status: None   Collection Time: 10/17/20  8:43 AM   Specimen: Nasopharyngeal Swab  Result Value Ref Range   SARS Coronavirus 2 NEGATIVE NEGATIVE    Comment: (NOTE) SARS-CoV-2 target nucleic acids are NOT DETECTED.  The SARS-CoV-2 RNA is generally detectable in upper and lower respiratory specimens during the acute phase of infection. Negative results do not preclude SARS-CoV-2 infection, do not rule out co-infections with other pathogens, and should not be used as the sole basis for treatment or other patient management decisions. Negative results must be combined with clinical observations, patient history, and epidemiological information. The expected result is Negative.  Fact Sheet for Patients: SugarRoll.be  Fact Sheet for Healthcare Providers: https://www.woods-mathews.com/  This test is not yet approved or cleared by the [place] FDA and  has been authorized for  detection and/or diagnosis of SARS-CoV-2 by FDA under an Emergency Use Authorization (EUA). This EUA will remain  in effect (meaning this test can be used) for the duration of the COVID-19 declaration under Se ction 564(b)(1) of the Act, 21 U.S.C. section 360bbb-3(b)(1), unless the authorization is terminated or revoked sooner.  Performed at [facility], [place] [address], [place], [place] [address]    No results found.  Review of Systems  All other systems reviewed and are negative.  Blood pressure (!) 168/53, pulse 82, temperature 97.8 F (36.6 C), temperature source Oral, resp. rate 18, height 5\' 8"  (1.727 m), weight (!) 149.7 kg, SpO2 96 %. Physical Exam  Patient is alert, oriented, no adenopathy, well-dressed, normal affect, normal respiratory effort. Examination patient has painful induration and cellulitis of the entire left leg she is also developing  swelling of the left thigh.  There is purulent drainage from the anterior medial longitudinal incision.  Patient has progressive advanced collapse of the medial tibial plateau.Heart RRR Lungs Clear   Assessment/Plan 1. Chronic pain of left knee  2. Subacute osteomyelitis of left tibia (HCC)       Plan: Discussed with the patient with the worsening infection of the proximal tibial plateau despite aggressive limb salvage intervention her best option would be to proceed with an above-the-knee amputation.  Risks and benefits were discussed patient states she understands this was also discussed with her husband present.  We will plan for surgery this week and anticipate discharge to [facility] skilled nursing.  Bevely Palmer Marvelle Caudill, PA 10/18/2020, 6:34 AM

## 2020-10-19 ENCOUNTER — Encounter (HOSPITAL_COMMUNITY): Payer: Self-pay | Admitting: Orthopedic Surgery

## 2020-10-19 LAB — BASIC METABOLIC PANEL
Anion gap: 10 (ref 5–15)
BUN: <5 mg/dL — ABNORMAL LOW (ref 8–23)
CO2: 25 mmol/L (ref 22–32)
Calcium: 8.4 mg/dL — ABNORMAL LOW (ref 8.9–10.3)
Chloride: 102 mmol/L (ref 98–111)
Creatinine, Ser: 0.46 mg/dL (ref 0.44–1.00)
GFR, Estimated: >60 mL/min (ref >60)
Glucose, Bld: 138 mg/dL — ABNORMAL HIGH (ref 70–99)
Potassium: 4 mmol/L (ref 3.5–5.1)
Sodium: 137 mmol/L (ref 135–145)

## 2020-10-19 LAB — CBC
HCT: 33.4 % — ABNORMAL LOW (ref 36.0–46.0)
Hemoglobin: 10.3 g/dL — ABNORMAL LOW (ref 12.0–15.0)
MCH: 28.8 pg (ref 26.0–34.0)
MCHC: 30.8 g/dL (ref 30.0–36.0)
MCV: 93.3 fL (ref 80.0–100.0)
Platelets: 232 10*3/uL (ref 150–400)
RBC: 3.58 MIL/uL — ABNORMAL LOW (ref 3.87–5.11)
RDW: 15.8 % — ABNORMAL HIGH (ref 11.5–15.5)
WBC: 10 10*3/uL (ref 4.0–10.5)
nRBC: 0 % (ref 0.0–0.2)

## 2020-10-19 NOTE — Evaluation (Signed)
Physical Therapy Evaluation Patient Details Name: [name] MRN: [id number] DOB: 19-Aug-1955 Today's Date: 10/19/2020   History of Present Illness  The pt is a 65 yo female admitted 7/1 s/p AKA due to osteomyelitis of L tibia. PMH includes L femur IMN, HTN, obesity, bipolar disorder, ETOH use.   Clinical Impression  Pt in bed upon arrival of PT, agreeable to evaluation at this time. Prior to admission the pt was mobilizing short distances in the home with use of RW or WC due to pain in her LLE and fatigue with mobility. The pt now presents with limitations in functional mobility, ROM, strength, stability, and power due to above dx and resulting pain, and will continue to benefit from skilled PT to address these deficits. The pt required modA of 1-2 to manage bed mobility, mostly limited by pain this morning despite attempts to premedicate. The pt was able to follow all cues for technique, but was unable to attempt standing due to fear of pain and pain at tis time. Will need continued rehab at SNF level after d/c as pt reports her spouse is unable to physically assist. Will continue to benefit from skilled PT to progress OOB mobility and exercises to maintain strength and ROM following amputation.      Follow Up Recommendations SNF;Supervision for mobility/OOB    Equipment Recommendations   (defer to post acute)    Recommendations for Other Services       Precautions / Restrictions Precautions Precautions: Fall Required Braces or Orthoses:  (LLE shrinker) Restrictions Weight Bearing Restrictions: Yes LLE Weight Bearing: Non weight bearing      Mobility  Bed Mobility Overal bed mobility: Needs Assistance Bed Mobility: Supine to Sit;Sit to Supine     Supine to sit: Min assist;+2 for physical assistance;HOB elevated Sit to supine: Mod assist;+2 for physical assistance;HOB elevated   General bed mobility comments: increased time for pain management    Transfers Overall transfer  level: Needs assistance   Transfers: Lateral/Scoot Transfers          Lateral/Scoot Transfers: Mod assist;+2 safety/equipment General transfer comment: modA of 1 with bed pad to scoot along EOB, minA of 2 to assist with pad position. pt assist with RLE and BUE  Ambulation/Gait             General Gait Details: NT, pt declined to stand     Balance Overall balance assessment: Needs assistance Sitting-balance support: Feet supported;No upper extremity supported Sitting balance-Leahy Scale: Fair Sitting balance - Comments: BUE support                                     Pertinent Vitals/Pain Pain Assessment: No/denies pain Faces Pain Scale: Hurts little more Pain Location: L residual limb with movement Pain Descriptors / Indicators: Discomfort;Grimacing;Guarding Pain Intervention(s): Limited activity within patient's tolerance    Home Living Family/patient expects to be discharged to:: Private residence Living Arrangements: Spouse/significant other Available Help at Discharge: Family;Available 24 hours/day Type of Home: House Home Access: Stairs to enter   [employer] of Steps: 2 Home Layout: One level Home Equipment: Walker - 2 wheels;Hand held shower head;Adaptive equipment;Cane - quad;Wheelchair - manual;Other (comment);Walker - 4 wheels (sliding transfer board) Additional Comments: spouse admitted to hospital "3 hours before I came" agreeable to rehab    Prior Function Level of Independence: Needs assistance   Gait / Transfers Assistance Needed: waling with RW about 20  ft, using manual WC for hallway and longer bouts of mobility  ADL's / Homemaking Assistance Needed: using sliding transfer bench for shower, only with supervision from spouse. needing to be seated in [facility] for self-care  Comments: Sleeping in the recliner at home. Also, reports having to go to outpt PT b/c of dogs at home and can't do HH     Hand Dominance   Dominant  Hand: Right    Extremity/Trunk Assessment   Upper Extremity Assessment Upper Extremity Assessment: Defer to OT evaluation    Lower Extremity Assessment Lower Extremity Assessment: LLE deficits/detail LLE Deficits / Details: limited by pain, pt able to demo SLR and hip ABD/ADD LLE: Unable to fully assess due to pain    Cervical / Trunk Assessment Cervical / Trunk Assessment: Other exceptions Cervical / Trunk Exceptions: large body habitus  Communication   Communication: No difficulties  Cognition Arousal/Alertness: Awake/alert Behavior During Therapy: WFL for tasks assessed/performed Overall Cognitive Status: Within Functional Limits for tasks assessed                                 General Comments: Pt axious due to anticipated pain with movement      General Comments General comments (skin integrity, edema, etc.): VSS on RA, educated on shrinker use and management for phantom pain - pt reported refusal to use hoyer lift due to bad past experience with lift     PT Assessment Patient needs continued PT services  PT Problem List Decreased strength;Decreased range of motion;Decreased activity tolerance;Decreased balance;Decreased mobility;Pain       PT Treatment Interventions DME instruction;Gait training;Stair training;Functional mobility training;Therapeutic activities;Therapeutic exercise;Balance training    PT Goals (Current goals can be found in the Care Plan section)  Acute Rehab PT Goals Patient Stated Goal: less pain with movement PT Goal Formulation: With patient Time For Goal Achievement: 11/02/20 Potential to Achieve Goals: Good    Frequency Min 3X/week   Barriers to discharge Decreased caregiver support pt husband unable to provide physical assist    Co-evaluation PT/OT/SLP Co-Evaluation/Treatment: Yes Reason for Co-Treatment: For patient/therapist safety;To address functional/ADL transfers PT goals addressed during session:  Mobility/safety with mobility;Balance;Strengthening/ROM OT goals addressed during session: ADL's and self-care;Strengthening/ROM       AM-PAC PT "6 Clicks" Mobility  Outcome Measure Help needed turning from your back to your side while in a flat bed without using bedrails?: A Lot Help needed moving from lying on your back to sitting on the side of a flat bed without using bedrails?: A Lot Help needed moving to and from a bed to a chair (including a wheelchair)?: A Lot Help needed standing up from a chair using your arms (e.g., wheelchair or bedside chair)?: A Lot Help needed to walk in hospital room?: Total Help needed climbing 3-5 steps with a railing? : Total 6 Click Score: 10    End of Session Equipment Utilized During Treatment: Gait belt Activity Tolerance: Patient tolerated treatment well;Patient limited by pain Patient left: in bed;with call bell/phone within reach;with bed alarm set (chair position 45 deg) Nurse Communication: Mobility status PT Visit Diagnosis: Unsteadiness on feet (R26.81);Other abnormalities of gait and mobility (R26.89);Pain Pain - Right/Left: Left Pain - part of body: Leg    Time: 1009-1045 PT Time Calculation (min) (ACUTE ONLY): 36 min   Charges:   PT Evaluation $PT Eval Moderate Complexity: 1 Mod          Stepheni Cameron  Hillery Jacks, DPT   Acute Rehabilitation Department Pager #: [phone]  Otho Bellows 10/19/2020, 12:02 PM

## 2020-10-19 NOTE — Plan of Care (Signed)
  Problem: Activity: Goal: Risk for activity intolerance will decrease Outcome: Progressing   Problem: Pain Managment: Goal: General experience of comfort will improve Outcome: Progressing   

## 2020-10-19 NOTE — Plan of Care (Signed)

## 2020-10-19 NOTE — Progress Notes (Signed)
Subjective: 1 Day Post-Op Procedure(s) (LRB): LEFT ABOVE KNEE AMPUTATION (Left) Patient reports pain as moderate to severe.     Objective: Vital signs in last 24 hours: Temp:  [97.2 F (36.2 C)-98.5 F (36.9 C)] 98 F (36.7 C) (07/02 0010) Pulse Rate:  [82-95] 95 (07/02 0010) Resp:  [14-21] 18 (07/02 0010) BP: (143-177)/(72-85) 151/73 (07/02 0010) SpO2:  [91 %-99 %] 94 % (07/02 0010)  Intake/Output from previous day: 07/01 0701 - 07/02 0700 In: 940 [P.O.:240; I.V.:500; IV Piggyback:200] Out: 200 [Blood:200] Intake/Output this shift: No intake/output data recorded.  Recent Labs    10/19/20 0137  HGB 10.3*   Recent Labs    10/19/20 0137  WBC 10.0  RBC 3.58*  HCT 33.4*  PLT 232   Recent Labs    10/19/20 0137  NA 137  K 4.0  CL 102  CO2 25  BUN <5*  CREATININE 0.46  GLUCOSE 138*  CALCIUM 8.4*   No results for input(s): LABPT, INR in the last 72 hours.  Incision: dressing C/D/I   Assessment/Plan: 1 Day Post-Op Procedure(s) (LRB): LEFT ABOVE KNEE AMPUTATION (Left) Up with therapy SNF later next week.      [name] 10/19/2020, 7:50 AM

## 2020-10-19 NOTE — Progress Notes (Signed)
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 PMH includes L femur IMN, HTN, obesity, bipolar disorder, ETOH use.   Clinical Impression   [name] was evaluated s/p the above AKA. PTA pt was mod I with in home tasks from rw and wc level, she has had extensive hospital and rehab stays recently, and was only d/c'd home for a few days prior to coming back to [facility] for AKA surgery. Upon evaluation pt was anxious for mobility and required verbal encouragement and one step cues to participate. She was able to complete bed mobility throughout with min-mod +2. Sitting EOB, pt was educated on shrinker use and techniques for phantom pain. Pt scooted towards her R side with mod A +1. Sitting upper ADLs are set up A, and lower sit<>stand ADLs are max A +2. Pt refused to attempt sit<>stand this session. She benefits from OT acutely to progress function in ADLs and mobility. Recommend SNF, pending pt progress acutely.     Follow Up Recommendations  SNF;Supervision - Intermittent    Equipment Recommendations  3 in 1 bedside commode (pending pt progress - may need drop arm BSC & drop arm wc)       Precautions / Restrictions Precautions Precautions: Fall Restrictions Weight Bearing Restrictions: Yes LLE Weight Bearing: Non weight bearing      Mobility Bed Mobility Overal bed mobility: Needs Assistance Bed Mobility: Supine to Sit;Sit to Supine     Supine to sit: Min assist;+2 for physical assistance;HOB elevated Sit to supine: Mod assist;+2 for physical assistance;HOB elevated   General bed mobility comments: increased time for pain management    Transfers Overall transfer level: Needs assistance               General transfer comment: pt declined to attempt OOB transfer this session    Balance Overall balance  assessment: Needs assistance Sitting-balance support: Feet supported;No upper extremity supported Sitting balance-Leahy Scale: Fair           ADL either performed or assessed with clinical judgement   ADL Overall ADL's : Needs assistance/impaired Eating/Feeding: Independent;Sitting   Grooming: Wash/dry hands;Wash/dry face;Oral care;Applying deodorant;Brushing hair;Set up;Sitting   Upper Body Bathing: Set up;Sitting   Lower Body Bathing: Maximal assistance;+2 for physical assistance;+2 for safety/equipment;Sit to/from stand   Upper Body Dressing : Set up;Sitting   Lower Body Dressing: Maximal assistance;+2 for physical assistance;+2 for safety/equipment;Sit to/from stand   Toilet Transfer: Maximal assistance;+2 for physical assistance;+2 for safety/equipment   Toileting- Clothing Manipulation and Hygiene: Maximal assistance;+2 for physical assistance;+2 for safety/equipment;Sit to/from stand       Functional mobility during ADLs: Maximal assistance;+2 for physical assistance;+2 for safety/equipment General ADL Comments: pt limited to bed level this session due to refusal to attemtp sit<>stand     Vision Patient Visual Report: No change from baseline Vision Assessment?: No apparent visual deficits            Pertinent Vitals/Pain Pain Assessment: Faces Faces Pain Scale: Hurts little more Pain Location: L residual limb with movement Pain Descriptors / Indicators: Discomfort;Grimacing;Guarding Pain Intervention(s): Limited activity within patient's tolerance;Monitored during session     Hand Dominance Right   Extremity/Trunk Assessment Upper Extremity Assessment Upper Extremity Assessment: Overall WFL for tasks assessed   Lower Extremity Assessment Lower Extremity Assessment: Defer to PT evaluation   Cervical /  Trunk Assessment Cervical / Trunk Assessment: Other exceptions (incrased body habitus)   Communication Communication Communication: No difficulties    Cognition Arousal/Alertness: Awake/alert Behavior During Therapy: WFL for tasks assessed/performed Overall Cognitive Status: Within Functional Limits for tasks assessed       General Comments: Pt axious due to anticipated pain with movement   General Comments  VSS on RA, educated on shrinker use - pt reported refusal to use hyoer lift due to bad past experience with lift            Home Living Family/patient expects to be discharged to:: Private residence Living Arrangements: Spouse/significant other Available Help at Discharge: Family;Available 24 hours/day Type of Home: House Home Access: Stairs to enter [employer] of Steps: 2   Home Layout: One level     Bathroom Shower/Tub: Teacher, early years/pre: Standard Bathroom Accessibility: Yes   Home Equipment: Walker - 2 wheels;Hand held shower head;Adaptive equipment;Cane - quad;Wheelchair - manual;Other (comment);Walker - 4 wheels (sliding transfer board)   Additional Comments: spouse admitted to hospital "3 hours before I came" agreeable to rehab      Prior Functioning/Environment Level of Independence: Needs assistance  Gait / Transfers Assistance Needed: waling with RW about 20 ft, using manual WC for hallway and longer bouts of mobility ADL's / Homemaking Assistance Needed: using sliding transfer bench for shower, only with supervision from spouse. needing to be seated in [facility] for self-care   Comments: Sleeping in the recliner at home. Also, reports having to go to outpt PT b/c of dogs at home and can't do [facility]        OT Problem List: Decreased strength;Decreased range of motion;Decreased activity tolerance;Impaired balance (sitting and/or standing);Decreased safety awareness;Decreased knowledge of use of DME or AE;Decreased knowledge of precautions;Pain      OT Treatment/Interventions: Self-care/ADL training;Therapeutic exercise;DME and/or AE instruction;Therapeutic activities;Patient/family  education;Balance training    OT Goals(Current goals can be found in the care plan section) Acute Rehab OT Goals Patient Stated Goal: less pain with movement OT Goal Formulation: With patient Time For Goal Achievement: 11/02/20 Potential to Achieve Goals: Fair ADL Goals Pt Will Perform Lower Body Bathing: with min guard assist;sit to/from stand Pt Will Perform Lower Body Dressing: with min guard assist;sit to/from stand Pt Will Transfer to Toilet: with min guard assist;stand pivot transfer;bedside commode Pt Will Perform Toileting - Clothing Manipulation and hygiene: with min guard assist;sit to/from stand  OT Frequency: Min 2X/week           Co-evaluation PT/OT/SLP Co-Evaluation/Treatment: Yes Reason for Co-Treatment: Complexity of the patient's impairments (multi-system involvement);For patient/therapist safety;To address functional/ADL transfers   OT goals addressed during session: ADL's and self-care;Strengthening/ROM      AM-PAC OT "6 Clicks" Daily Activity     Outcome Measure Help from another person eating meals?: None Help from another person taking care of personal grooming?: A Little Help from another person toileting, which includes using toliet, bedpan, or urinal?: A Lot Help from another person bathing (including washing, rinsing, drying)?: A Lot Help from another person to put on and taking off regular upper body clothing?: A Little Help from another person to put on and taking off regular lower body clothing?: A Lot 6 Click Score: 16   End of Session Equipment Utilized During Treatment: Gait belt Nurse Communication: Mobility status  Activity Tolerance: Patient tolerated treatment well Patient left: in bed;with call bell/phone within reach;with bed alarm set  OT Visit Diagnosis: Unsteadiness on feet (R26.81);Other abnormalities of  gait and mobility (R26.89);Pain                Time: 1010-1046 OT Time Calculation (min): 36 min Charges:  OT General  Charges $OT Visit: 1 Visit OT Evaluation $OT Eval Moderate Complexity: 1 Mod   Dione Petron A Antoinette Haskett 10/19/2020, 11:03 AM

## 2020-10-20 LAB — CBC
HCT: 30.5 % — ABNORMAL LOW (ref 36.0–46.0)
Hemoglobin: 9.6 g/dL — ABNORMAL LOW (ref 12.0–15.0)
MCH: 28.4 pg (ref 26.0–34.0)
MCHC: 31.5 g/dL (ref 30.0–36.0)
MCV: 90.2 fL (ref 80.0–100.0)
Platelets: 291 10*3/uL (ref 150–400)
RBC: 3.38 MIL/uL — ABNORMAL LOW (ref 3.87–5.11)
RDW: 15.6 % — ABNORMAL HIGH (ref 11.5–15.5)
WBC: 8.2 10*3/uL (ref 4.0–10.5)
nRBC: 0 % (ref 0.0–0.2)

## 2020-10-20 LAB — BASIC METABOLIC PANEL
Anion gap: 7 (ref 5–15)
BUN: <5 mg/dL — ABNORMAL LOW (ref 8–23)
CO2: 31 mmol/L (ref 22–32)
Calcium: 8.2 mg/dL — ABNORMAL LOW (ref 8.9–10.3)
Chloride: 103 mmol/L (ref 98–111)
Creatinine, Ser: 0.42 mg/dL — ABNORMAL LOW (ref 0.44–1.00)
GFR, Estimated: >60 mL/min (ref >60)
Glucose, Bld: 107 mg/dL — ABNORMAL HIGH (ref 70–99)
Potassium: 3.9 mmol/L (ref 3.5–5.1)
Sodium: 141 mmol/L (ref 135–145)

## 2020-10-20 NOTE — Progress Notes (Signed)
Subjective: 2 Days Post-Op Procedure(s) (LRB): LEFT ABOVE KNEE AMPUTATION (Left) Patient reports pain as moderate.  Burning pain left thigh . On Neurontin 800 mg TID.   Objective: Vital signs in last 24 hours: Temp:  [98 F (36.7 C)-98.3 F (36.8 C)] 98 F (36.7 C) (07/03 0826) Pulse Rate:  [84-91] 91 (07/03 0826) Resp:  [17-18] 18 (07/03 0826) BP: (159-183)/(71-81) 159/81 (07/03 0826) SpO2:  [94 %-96 %] 96 % (07/03 0826)  Intake/Output from previous day: 07/02 0701 - 07/03 0700 In: 720 [P.O.:720] Out: 3600 [Urine:3600] Intake/Output this shift: No intake/output data recorded.  Recent Labs    10/19/20 0137 10/20/20 0127  HGB 10.3* 9.6*   Recent Labs    10/19/20 0137 10/20/20 0127  WBC 10.0 8.2  RBC 3.58* 3.38*  HCT 33.4* 30.5*  PLT 232 291   Recent Labs    10/19/20 0137 10/20/20 0127  NA 137 141  K 4.0 3.9  CL 102 103  CO2 25 31  BUN <5* <5*  CREATININE 0.46 0.42*  GLUCOSE 138* 107*  CALCIUM 8.4* 8.2*   No results for input(s): LABPT, INR in the last 72 hours.  Incision: dressing C/D/I Compartment soft No drainage in canister.   Assessment/Plan: 2 Days Post-Op Procedure(s) (LRB): LEFT ABOVE KNEE AMPUTATION (Left) Up with therapy SNF later this week.       Laurence Folz 10/20/2020, 9:22 AM

## 2020-10-20 NOTE — Plan of Care (Signed)
  Problem: Health Behavior/Discharge Planning: Goal: Ability to manage health-related needs will improve Outcome: Progressing   Problem: Activity: Goal: Risk for activity intolerance will decrease Outcome: Progressing   Problem: Coping: Goal: Level of anxiety will decrease Outcome: Progressing   

## 2020-10-20 NOTE — Plan of Care (Signed)
  Problem: Activity: Goal: Risk for activity intolerance will decrease Outcome: Progressing   Problem: Coping: Goal: Level of anxiety will decrease Outcome: Progressing   Problem: Pain Managment: Goal: General experience of comfort will improve Outcome: Progressing   Problem: Safety: Goal: Ability to remain free from injury will improve Outcome: Progressing   Problem: Skin Integrity: Goal: Risk for impaired skin integrity will decrease Outcome: Progressing   

## 2020-10-21 NOTE — Progress Notes (Signed)
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 PMH includes L femur IMN, HTN, obesity, bipolar disorder, ETOH use.    PT Comments    Pt admitted with above diagnosis. Pt was able to stand to [facility] with mod assist of 2.  Pt stood better than expected.  Was able to move pt to chair in [place]. Pt was happy to do well and with incr confidence.  Pt currently with functional limitations due to balance and endurance deficits. Pt will benefit from skilled PT to increase their independence and safety with mobility to allow discharge to the venue listed below.      Follow Up Recommendations  SNF;Supervision for mobility/OOB     Equipment Recommendations   (defer to post acute)    Recommendations for Other Services       Precautions / Restrictions Precautions Precautions: Fall Precaution Comments: VAC in place on AKA Required Braces or Orthoses:  (LLE shrinker) Restrictions Weight Bearing Restrictions: Yes LLE Weight Bearing: Non weight bearing    Mobility  Bed Mobility Overal bed mobility: Needs Assistance Bed Mobility: Supine to Sit;Sit to Supine     Supine to sit: HOB elevated;Min guard     General bed mobility comments: Pt was wet on arrival due to [place] leaked. Cleaned pt and changed her gown. increased time for pain management    Transfers Overall transfer level: Needs assistance   Transfers: Sit to/from Stand Sit to Stand: Mod assist;+2 physical assistance;From elevated surface         General transfer comment: Pt needed assist to power up with bed raised but was able to pull up on STedy and then moved pt in sTedy to the recliner where pt stood again to sit in chair.  Stedy worked very well for pt for transfer.  Ambulation/Gait             General Gait Details: NT   Stairs              Wheelchair Mobility    Modified Rankin (Stroke Patients Only)       Balance Overall balance assessment: Needs assistance Sitting-balance support: Feet supported;No upper extremity supported Sitting balance-Leahy Scale: Poor Sitting balance - Comments: BUE support   Standing balance support: Bilateral upper extremity supported;During functional activity Standing balance-Leahy Scale: Poor Standing balance comment: bil UE support needed and external support for static stance.                            Cognition Arousal/Alertness: Awake/alert Behavior During Therapy: WFL for tasks assessed/performed Overall Cognitive Status: Within Functional Limits for tasks assessed                                 General Comments: Pt anxious due to anticipated pain with movement      Exercises      General Comments General comments (skin integrity, edema, etc.): VSS      Pertinent Vitals/Pain Pain Assessment: Faces Faces Pain Scale: Hurts even more Pain Location: L residual limb with movement Pain Descriptors / Indicators: Discomfort;Grimacing;Guarding Pain Intervention(s): Limited activity within patient's tolerance;Monitored during session;Repositioned    Home Living  Prior Function            PT Goals (current goals can now be found in the care plan section) Acute Rehab PT Goals Patient Stated Goal: less pain with movement Progress towards PT goals: Progressing toward goals    Frequency    Min 3X/week      PT Plan Current plan remains appropriate    Co-evaluation              AM-PAC PT "6 Clicks" Mobility   Outcome Measure  Help needed turning from your back to your side while in a flat bed without using bedrails?: A Little Help needed moving from lying on your back to sitting on the side of a flat bed without using bedrails?: A Little Help needed moving to and from a bed to a chair  (including a wheelchair)?: Total Help needed standing up from a chair using your arms (e.g., wheelchair or bedside chair)?: Total Help needed to walk in hospital room?: Total Help needed climbing 3-5 steps with a railing? : Total 6 Click Score: 10    End of Session Equipment Utilized During Treatment: Gait belt Activity Tolerance: Patient tolerated treatment well;Patient limited by pain Patient left: with call bell/phone within reach;in chair;with chair alarm set Nurse Communication: Mobility status PT Visit Diagnosis: Unsteadiness on feet (R26.81);Other abnormalities of gait and mobility (R26.89);Pain Pain - Right/Left: Left Pain - part of body: Leg     Time: 1043-1110 PT Time Calculation (min) (ACUTE ONLY): 27 min  Charges:  $Therapeutic Activity: 23-37 mins                     Candy Ziegler M,PT Acute Rehab Services [phone] (pager)    Alvira Philips 10/21/2020, 2:10 PM

## 2020-10-21 NOTE — NC FL2 (Signed)
[place] LEVEL OF CARE SCREENING TOOL     IDENTIFICATION  Patient Name: [name] Birthdate: 1955-11-16 Sex: female Admission Date (Current Location): 10/18/2020  [facility] [place] and [place] Number:  Herbalist and Address:  [facility] 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)    Arthritis of left knee 09/20/2020   Septic arthritis of knee, left ([place]) 09/17/2020   Cellulitis and abscess of lower extremity 09/11/2020   Debility 05/02/2020   Urinary incontinence 05/02/2020   Pain in left shin    Urinary frequency    Sleep disturbance    Pain    Anxiety state    Urinary urgency    Drug induced constipation    Post-operative pain    Benign essential HTN    Pruritus    Left medial tibial plateau fracture 03/01/2020   Morbid obesity ([place])    Acute blood loss anemia    Postoperative pain    Tibial plateau fracture 02/25/2020   Tibial plateau fracture, left 02/25/2020   Hyponatremia 02/25/2020   Alcohol use disorder, moderate, dependence ([place]) 02/25/2020   Essential hypertension 02/25/2020    Orientation RESPIRATION BLADDER Height & Weight     Self, Time, Situation, Place  Normal Incontinent, External catheter Weight: (!) 330 lb (149.7 kg) Height:  5\' 8"  (172.7 cm)  BEHAVIORAL SYMPTOMS/MOOD NEUROLOGICAL BOWEL NUTRITION STATUS      Continent Diet (see d/c summary)  AMBULATORY  STATUS COMMUNICATION OF NEEDS Skin   Extensive Assist Verbally Surgical wounds, Wound Vac (Praveena wound vac, left AKA)                       Personal Care Assistance Level of Assistance  Bathing, Dressing, Feeding Bathing Assistance: Maximum assistance Feeding assistance: Independent Dressing Assistance: Limited assistance     Functional Limitations Info  Sight, Hearing, Speech Sight Info: Adequate Hearing Info: Adequate Speech Info: Adequate    SPECIAL CARE FACTORS FREQUENCY  PT (By licensed PT), OT (By licensed OT)     PT Frequency: 5x/week OT Frequency: 5x/week            Contractures Contractures Info: Not present    Additional Factors Info  Code Status, Allergies Code Status Info: Full code Allergies Info: Morphine And Related, oxycodone           Current Medications (10/21/2020):  This is the current hospital active medication list Current Facility-Administered Medications  Medication Dose Route Frequency Provider Last Rate Last Admin   0.9 %  sodium chloride infusion   Intravenous Continuous Persons, Bevely Palmer, [place] 75 mL/hr at 10/18/20 1557 New Bag at 10/18/20 1557   acetaminophen (TYLENOL) tablet 325-650 mg  325-650 mg Oral Q6H PRN Persons, Bevely Palmer, [place]   650 mg at 10/21/20 0747   ALPRAZolam Duanne Moron) tablet 0.5 mg  0.5 mg Oral TID PRN Persons, Bevely Palmer, PA   0.5 mg at 10/21/20  0354   alum & mag hydroxide-simeth (MAALOX/MYLANTA) 200-200-20 MG/5ML suspension 15-30 mL  15-30 mL Oral Q2H PRN Persons, Bevely Palmer, PA       ARIPiprazole (ABILIFY) tablet 5 mg  5 mg Oral Daily Persons, Bevely Palmer, [place]   5 mg at 10/21/20 1020   ascorbic acid (VITAMIN C) tablet 1,000 mg  1,000 mg Oral Daily Persons, Bevely Palmer, PA   1,000 mg at 10/21/20 1020   bisacodyl (DULCOLAX) EC tablet 5 mg  5 mg Oral Daily PRN Persons, Bevely Palmer, PA       celecoxib (CELEBREX) capsule 200 mg  200 mg Oral BID Persons, Bevely Palmer, PA   200 mg at 10/21/20 1020   cholecalciferol (VITAMIN D3) tablet  1,000 Units  1,000 Units Oral Daily Persons, Bevely Palmer, PA   1,000 Units at 10/21/20 1020   diphenhydrAMINE (BENADRYL) capsule 25 mg  25 mg Oral Q6H PRN Persons, Bevely Palmer, PA       docusate sodium (COLACE) capsule 100 mg  100 mg Oral Daily Persons, Bevely Palmer, PA   100 mg at 10/21/20 1020   famotidine (PEPCID) tablet 20 mg  20 mg Oral Daily PRN Persons, Bevely Palmer, PA   20 mg at 10/20/20 1549   furosemide (LASIX) tablet 20 mg  20 mg Oral Daily Persons, Bevely Palmer, PA   20 mg at 10/21/20 1020   gabapentin (NEURONTIN) capsule 800 mg  800 mg Oral TID Persons, Bevely Palmer, PA   800 mg at 10/21/20 1020   guaiFENesin-dextromethorphan (ROBITUSSIN DM) 100-10 MG/5ML syrup 15 mL  15 mL Oral Q4H PRN Persons, Bevely Palmer, PA       HYDROmorphone (DILAUDID) injection 0.5 mg  0.5 mg Intravenous Q4H PRN Newt Minion, MD   0.5 mg at 10/21/20 0047   ketorolac (TORADOL) 15 MG/ML injection 7.5 mg  7.5 mg Intravenous Q6H Newt Minion, MD   7.5 mg at 10/21/20 8502   loratadine (CLARITIN) tablet 10 mg  10 mg Oral Daily Persons, Bevely Palmer, PA   10 mg at 10/21/20 1020   magnesium citrate solution 1 Bottle  1 Bottle Oral Once PRN Persons, Bevely Palmer, PA       nutrition supplement (JUVEN) (JUVEN) powder packet 1 packet  1 packet Oral BID BM Persons, Bevely Palmer, [place]   1 packet at 10/19/20 0851   ondansetron (ZOFRAN) injection 4 mg  4 mg Intravenous Q6H PRN Persons, Bevely Palmer, PA       oxyCODONE (Oxy IR/ROXICODONE) immediate release tablet 5-10 mg  5-10 mg Oral Q4H PRN Persons, Bevely Palmer, PA   10 mg at 10/21/20 0746   pantoprazole (PROTONIX) EC tablet 40 mg  40 mg Oral Daily Persons, Bevely Palmer, PA   40 mg at 10/21/20 1020   phenol (CHLORASEPTIC) mouth spray 1 spray  1 spray Mouth/Throat PRN Persons, Bevely Palmer, PA       polyethylene glycol (MIRALAX / GLYCOLAX) packet 17 g  17 g Oral Daily PRN Persons, Bevely Palmer, PA       potassium chloride SA (KLOR-CON) CR tablet 20 mEq  20 mEq Oral Daily Persons, Bevely Palmer, PA   20 mEq at 10/21/20  1019   zinc sulfate capsule 220 mg  220 mg Oral Daily Persons, Bevely Palmer, PA   220 mg at 10/21/20 1020     Discharge Medications: Please see discharge summary for a list of discharge medications.  Relevant Imaging Results:  Relevant Lab Results:   Additional Information SSN:  [id number] Pt is covid vaccinated with 1 booster  [employer], LCSW

## 2020-10-21 NOTE — Plan of Care (Signed)

## 2020-10-21 NOTE — Plan of Care (Signed)
  Problem: Education: Goal: Knowledge of General Education information will improve Description Including pain rating scale, medication(s)/side effects and non-pharmacologic comfort measures Outcome: Adequate for Discharge   Problem: Health Behavior/Discharge Planning: Goal: Ability to manage health-related needs will improve Outcome: Adequate for Discharge   

## 2020-10-21 NOTE — Progress Notes (Signed)
     Subjective: 3 Days Post-Op Procedure(s) (LRB): LEFT ABOVE KNEE AMPUTATION (Left) Awake, alert and oriented x 4. Left AKA with VAC Reports that the peerwick did not function well and it  Allowed urine to staturate her bed and left AKA stump sock. Patient reports pain as moderate.    Objective:   VITALS:  Temp:  [97.3 F (36.3 C)-98.4 F (36.9 C)] 98.1 F (36.7 C) (07/04 0747) Pulse Rate:  [79-87] 87 (07/04 0747) Resp:  [16-18] 16 (07/04 0747) BP: (131-174)/(76-81) 131/80 (07/04 0747) SpO2:  [93 %-98 %] 97 % (07/04 0747)  Neurologically intact ABD soft Incision: dressing C/D/I, no drainage, and VAC intact and drawing negative pressure, She is apprehensive about having a new stump sock applied. AKA is elevated, no significant swelling.  No cellulitis present Compartment soft   LABS Recent Labs    10/19/20 0137 10/20/20 0127  HGB 10.3* 9.6*  WBC 10.0 8.2  PLT 232 291   Recent Labs    10/19/20 0137 10/20/20 0127  NA 137 141  K 4.0 3.9  CL 102 103  CO2 25 31  BUN <5* <5*  CREATININE 0.46 0.42*  GLUCOSE 138* 107*   No results for input(s): LABPT, INR in the last 72 hours.   Assessment/Plan: 3 Days Post-Op Procedure(s) (LRB): LEFT ABOVE KNEE AMPUTATION (Left)  Advance diet Up with therapy D/C IV fluids  Basil Dess 10/21/2020, 11:41 AM Patient ID: [name], female   DOB: 10-11-1955, 65 y.o.   MRN: [id number]

## 2020-10-21 NOTE — Progress Notes (Signed)
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 PMH includes L femur IMN, HTN, obesity, bipolar disorder, ETOH use. (Simultaneous filing. User may not have seen previous data.)   OT comments  Kyndahl is progressing well towards her goals. Pt completed sit<>stand with extreme forward flexion to pull herself into standing with use of the steady frame and min A +2. She has poor standing tolerance and required sitting break immediately. Total A via steady for surface transfer. Bed mobility is min A, pt rolls and boosts herself in bed well. Pt continues to benefit from OT acutely. D/c plan remains appropriate.    Follow Up Recommendations  SNF;Supervision - Intermittent    Equipment Recommendations  3 in 1 bedside commode       Precautions / Restrictions Precautions Precautions: Fall (Simultaneous filing. User may not have seen previous data.) Precaution Comments: wound vac (Simultaneous filing. User may not have seen previous data.) Required Braces or Orthoses:  (LLE shrinker) Restrictions Weight Bearing Restrictions: Yes LLE Weight Bearing: Non weight bearing       Mobility Bed Mobility Overal bed mobility: Needs Assistance (Simultaneous filing. User may not have seen previous data.) Bed Mobility: Rolling;Sit to Supine (Simultaneous filing. User may not have seen previous data.) Rolling: Min assist   Supine to sit: Min assist;+2 for physical assistance;+2 for safety/equipment (Simultaneous filing. User may not have seen previous data.)     General bed mobility comments: min A to mange body back into bed - pt impulsive with movement back into bed due to pain (Simultaneous filing. User may not have seen previous data.)    Transfers Overall transfer level: Needs assistance (Simultaneous filing. User may not  have seen previous data.)   Transfers: Sit to/from [facility] (Simultaneous filing. User may not have seen previous data.) Sit to Stand: +2 physical assistance;From elevated surface;Min assist Stand pivot transfers: Total assist (via Steady)       General transfer comment: min A +2 to boost into steady, pt with extreme forward flexion to pull into standing (Simultaneous filing. User may not have seen previous data.)    Balance Overall balance assessment: Needs assistance (Simultaneous filing. User may not have seen previous data.) Sitting-balance support: Feet supported;No upper extremity supported (Simultaneous filing. User may not have seen previous data.) Sitting balance-Leahy Scale: Fair (Simultaneous filing. User may not have seen previous data.) Sitting balance - Comments: BUE support   Standing balance support: Bilateral upper extremity supported;During functional activity Standing balance-Leahy Scale: Poor Standing balance comment: bil UE support needed and external support for static stance.                           ADL either performed or assessed with clinical judgement   ADL Overall ADL's : Needs assistance/impaired                 Functional mobility during ADLs: Minimal assistance;Total assistance (min A + 1 to boost into standing; total A for surface transfer via steady) General ADL Comments: pt requesting to return to bed, pt tolerated steady well      Cognition Arousal/Alertness: Awake/alert (Simultaneous filing. User may not have seen previous data.) Behavior During Therapy: [facility] for tasks assessed/performed (Simultaneous filing. User may not have seen previous data.) Overall Cognitive Status:  Within Functional Limits for tasks assessed (Simultaneous filing. User may not have seen previous data.)           General Comments: Pt anxious due to anticipated pain with movement              General Comments no new concerns  (Simultaneous filing. User may not have seen previous data.)    Pertinent Vitals/ Pain       Pain Assessment: Faces (Simultaneous filing. User may not have seen previous data.) Faces Pain Scale: Hurts even more (Simultaneous filing. User may not have seen previous data.) Pain Location: L residual limb with movement (Simultaneous filing. User may not have seen previous data.) Pain Descriptors / Indicators: Discomfort;Grimacing;Guarding (Simultaneous filing. User may not have seen previous data.) Pain Intervention(s): Monitored during session (Simultaneous filing. User may not have seen previous data.)   Frequency  Min 2X/week        Progress Toward Goals  OT Goals(current goals can now be found in the care plan section)  Progress towards OT goals: Progressing toward goals  Acute Rehab OT Goals Patient Stated Goal: less pain with movement (Simultaneous filing. User may not have seen previous data.) OT Goal Formulation: With patient Time For Goal Achievement: 11/02/20 Potential to Achieve Goals: Fair ADL Goals Pt Will Perform Lower Body Bathing: with min guard assist;sit to/from stand Pt Will Perform Lower Body Dressing: with min guard assist;sit to/from stand Pt Will Transfer to Toilet: with min guard assist;stand pivot transfer;bedside commode Pt Will Perform Toileting - Clothing Manipulation and hygiene: with min guard assist;sit to/from stand  Plan Discharge plan remains appropriate       AM-PAC OT "6 Clicks" Daily Activity     Outcome Measure   Help from another person eating meals?: None Help from another person taking care of personal grooming?: A Little Help from another person toileting, which includes using toliet, bedpan, or urinal?: A Lot Help from another person bathing (including washing, rinsing, drying)?: A Lot Help from another person to put on and taking off regular upper body clothing?: A Little Help from another person to put on and taking off regular lower  body clothing?: A Lot 6 Click Score: 16    End of Session Equipment Utilized During Treatment: Gait belt;Other (comment) (wound vac, steady)  OT Visit Diagnosis: Unsteadiness on feet (R26.81);Other abnormalities of gait and mobility (R26.89);Pain   Activity Tolerance Patient tolerated treatment well;Patient limited by pain   Patient Left in bed;with call bell/phone within reach;with bed alarm set   Nurse Communication Mobility status        Time: 1335-1350 OT Time Calculation (min): 15 min  Charges: OT General Charges $OT Visit: 1 Visit OT Treatments $Therapeutic Activity: 8-22 mins     [place] Spees A Moataz Tavis 10/21/2020, 2:19 PM

## 2020-10-21 NOTE — Plan of Care (Signed)
  Problem: Activity: Goal: Risk for activity intolerance will decrease Outcome: Progressing   Problem: Coping: Goal: Level of anxiety will decrease Outcome: Progressing   Problem: Pain Managment: Goal: General experience of comfort will improve Outcome: Progressing   Problem: Safety: Goal: Ability to remain free from injury will improve Outcome: Progressing   Problem: Skin Integrity: Goal: Risk for impaired skin integrity will decrease Outcome: Progressing   

## 2020-10-21 NOTE — TOC Initial Note (Signed)
Transition of Care [facility]) - Initial/Assessment Note    Patient Details  Name: [name] MRN: [id number] Date of Birth: 06-17-1955  Transition of Care [facility]) CM/SW Contact:    Bethann Berks[place], [place] Phone Number: 10/21/2020, 10:27 AM  Clinical Narrative:                   Met with pt to discuss disposition. She reports she was at [facility] SNF for last 3 weeks and wants to go back. She was very pleased with the rehab she received there and reports she had made friends with other residents. She reports being covid vaccinated with 1 booster. CSW completed FL2 and faxed bed request in hub. Notified [place] liaison; CSW informed they would review clinicals tomorrow as today is holiday. [place] would need to start auth.   Expected Discharge Plan: Skilled Nursing Facility Barriers to Discharge: Continued Medical Work up   Patient Goals and CMS Choice     Choice offered to / list presented to : Patient  Expected Discharge Plan and Services Expected Discharge Plan: [place] Choice: [place] Living arrangements for the past 2 months: [place]                                      Prior Living Arrangements/Services Living arrangements for the past 2 months: [place] Lives with:: Facility Resident Patient language and need for interpreter reviewed:: Yes        Need for Family Participation in Patient Care: No (Comment) Care giver support system in place?: Yes (comment)   Criminal Activity/Legal Involvement Pertinent to Current Situation/Hospitalization: No - Comment as needed  Activities of Daily Living Home Assistive Devices/Equipment: Wheelchair, Environmental consultant (specify type) ADL Screening (condition at time of admission) Patient's cognitive ability adequate to safely complete daily activities?: Yes Is the patient deaf or have difficulty hearing?: No Does the patient have difficulty seeing, even when wearing  glasses/contacts?: No Does the patient have difficulty concentrating, remembering, or making decisions?: No Patient able to express need for assistance with ADLs?: Yes Does the patient have difficulty dressing or bathing?: No Independently performs ADLs?: Yes (appropriate for developmental age) Does the patient have difficulty walking or climbing stairs?: Yes Weakness of Legs: Both Weakness of Arms/Hands: None  Permission Sought/Granted                  Emotional Assessment Appearance:: Appears stated age Attitude/Demeanor/Rapport: Engaged Affect (typically observed): Accepting Orientation: : Oriented to Self, Oriented to Place, Oriented to  Time, Oriented to Situation Alcohol / Substance Use: Not Applicable Psych Involvement: No (comment)  Admission diagnosis:  Knee osteomyelitis, left [facility]) [M86.9] Patient Active Problem List   Diagnosis Date Noted   Knee osteomyelitis, left ([place]) 10/18/2020   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 (HCC)    Arthritis of left knee 09/20/2020   Septic arthritis of knee, left ([place]) 09/17/2020   Cellulitis and abscess of lower extremity 09/11/2020   Debility 05/02/2020   Urinary incontinence 05/02/2020   Pain in left shin    Urinary frequency    Sleep disturbance    Pain    Anxiety state    Urinary urgency    Drug induced constipation    Post-operative pain    Benign essential HTN    Pruritus    Left medial tibial plateau  fracture 03/01/2020   Morbid obesity ([place])    Acute blood loss anemia    Postoperative pain    Tibial plateau fracture 02/25/2020   Tibial plateau fracture, left 02/25/2020   Hyponatremia 02/25/2020   Alcohol use disorder, moderate, dependence ([place]) 02/25/2020   Essential hypertension 02/25/2020   PCP:  [name], MD Pharmacy:   [facility] [phone] [address], [address] AT [facility] [address] [place] [address] Phone: [phone] Fax:  [phone]     Social Determinants of Health ([place]) Interventions    Readmission Risk Interventions Readmission Risk Prevention Plan 02/27/2020  Post Dischage Appt Complete  Medication Screening Complete  Transportation Screening Complete  Some recent data might be hidden

## 2020-10-22 ENCOUNTER — Ambulatory Visit: Payer: BC Managed Care – PPO | Admitting: Internal Medicine

## 2020-10-22 NOTE — Plan of Care (Signed)

## 2020-10-22 NOTE — Progress Notes (Signed)
Patient ID: [name], female   DOB: 1955-12-26, 65 y.o.   MRN: [id number] Patient is status post left above-the-knee amputation.  There is no drainage in the wound VAC canister there is a good suction fit.  Patient wants to return to her previous skilled nursing facility.  Awaiting authorization for discharge.

## 2020-10-22 NOTE — TOC Progression Note (Signed)
Transition of Care [facility]) - Progression Note    Patient Details  Name: [name] MRN: [id number] Date of Birth: 23-Feb-1956  Transition of Care [facility]) CM/SW [place], [place] Phone Number: 10/22/2020, 10:57 AM  Clinical Narrative:     CSW spoke with Juliann Pulse at [facility]; they can take pt back under the condition that pt does not smoke when she returns to [facility]. Pt will need nicotine patch included in DC summary.   CSW spoke with pt who is agreeable to not smoking at facility. Juliann Pulse notified. [place] will start insurance auth.   Expected Discharge Plan: [place] Barriers to Discharge: Continued Medical Work up  Expected Discharge Plan and Services Expected Discharge Plan: [place] Choice: [place] arrangements for the past 2 months: [place]                                       Social Determinants of Health (SDOH) Interventions    Readmission Risk Interventions Readmission Risk Prevention Plan 02/27/2020  Post Dischage Appt Complete  Medication Screening Complete  Transportation Screening Complete  Some recent data might be hidden

## 2020-10-22 NOTE — Plan of Care (Signed)

## 2020-10-23 LAB — SARS CORONAVIRUS 2 (TAT 6-24 HRS): SARS Coronavirus 2: NEGATIVE

## 2020-10-23 LAB — SURGICAL PATHOLOGY

## 2020-10-23 MED ORDER — JUVEN PO PACK: 1.0000 | PACK | Freq: Two times a day (BID) | ORAL | 0 refills | Status: DC

## 2020-10-23 MED ORDER — OXYCODONE HCL 5 MG PO TABS: 5.0000 mg | ORAL_TABLET | ORAL | 0 refills | Status: DC | PRN

## 2020-10-23 MED ORDER — ASCORBIC ACID 1000 MG PO TABS: 1000.0000 mg | ORAL_TABLET | Freq: Every day | ORAL | Status: AC

## 2020-10-23 MED ORDER — NICOTINE 7 MG/24HR TD PT24: 7.0000 mg | MEDICATED_PATCH | TRANSDERMAL | 0 refills | Status: DC

## 2020-10-23 MED ORDER — DIPHENHYDRAMINE HCL 25 MG PO CAPS: 25.0000 mg | ORAL_CAPSULE | Freq: Four times a day (QID) | ORAL | 0 refills | Status: AC | PRN

## 2020-10-23 MED ORDER — ZINC SULFATE 220 (50 ZN) MG PO CAPS: 220.0000 mg | ORAL_CAPSULE | Freq: Every day | ORAL | Status: DC

## 2020-10-23 NOTE — Progress Notes (Signed)
Physical Therapy Treatment Patient Details Name: [name] MRN: [id number] DOB: [birth year] Today's Date: 10/23/2020    History of Present Illness The pt is a 65 yo female admitted 7/1 s/p AKA due to osteomyelitis of L tibia. PMH includes L femur IMN, HTN, obesity, bipolar disorder, ETOH use.    PT Comments    Pt remains to have anxiety re: mobilizing due to fear of falling. Pt did great with sit to stand in stedy but declined attempting with RW. WOrkedon R LE strengthening and L hip strengthening/ROM. Discussed at length phantom limb pain and various options to manage. Acute PT to cont to follow. Continue to recommend SNF upon d/c.   Follow Up Recommendations  SNF;Supervision for mobility/OOB     Equipment Recommendations       Recommendations for Other Services       Precautions / Restrictions Precautions Precautions: Fall Precaution Comments: wound vac Restrictions Weight Bearing Restrictions: Yes LLE Weight Bearing: Weight bearing as tolerated    Mobility  Bed Mobility Overal bed mobility: Needs Assistance Bed Mobility: Rolling;Sidelying to Sit Rolling: Min assist Sidelying to sit: Min assist       General bed mobility comments: minA for safety due to pt being morbidly obese and near EOB, minA for trunk elevation    Transfers Overall transfer level: Needs assistance Equipment used: Ambulation equipment used Transfers: Sit to/from Stand Sit to Stand: Min assist;+2 physical assistance;+2 safety/equipment Stand pivot transfers: Total assist       General transfer comment: pt completed 3 sit to stands in stedy. Stood x 2 min for hygiene between legs due to incontinence of urine. pt declined attempting to use RW due to "I'm afraid i'm going to fall"  Ambulation/Gait             General Gait Details: NT   Stairs             Wheelchair Mobility    Modified Rankin (Stroke Patients Only)       Balance Overall balance assessment: Needs  assistance Sitting-balance support: Feet supported;No upper extremity supported Sitting balance-Leahy Scale: Fair Sitting balance - Comments: BUE support   Standing balance support: Bilateral upper extremity supported;During functional activity Standing balance-Leahy Scale: Zero Standing balance comment: bil UE support needed and external support for static stance.                            Cognition Arousal/Alertness: Awake/alert Behavior During Therapy: Anxious Overall Cognitive Status: Within Functional Limits for tasks assessed                                 General Comments: anxious re: falling      Exercises General Exercises - Lower Extremity Ankle Circles/Pumps: AROM;Right;10 reps;Seated Quad Sets: AROM;Right;10 reps;Seated (with LE elevated) Gluteal Sets: AROM;Both;10 reps;Seated Long Arc Quad: AROM;Right;10 reps;Seated (with manual resistance) Heel Slides: AROM;Right;10 reps;Seated (with manual resistance) Hip Flexion/Marching: AROM;Both;10 reps;Supine    General Comments General comments (skin integrity, edema, etc.): pt with L wound vac intact, noted irritated between folds of abdomen, pt requesting desitin - RN notified      Pertinent Vitals/Pain Pain Assessment: 0-10 Pain Score: 6  Pain Location: L residual limb Pain Descriptors / Indicators: Burning Pain Intervention(s): Monitored during session (educated on various techniques ie: desensitizing, using a mirror to create 2 legs, and habitulization)    Home Living  Prior Function            PT Goals (current goals can now be found in the care plan section) Progress towards PT goals: Progressing toward goals    Frequency    Min 3X/week      PT Plan Current plan remains appropriate    Co-evaluation              AM-PAC PT "6 Clicks" Mobility   Outcome Measure  Help needed turning from your back to your side while in a flat bed  without using bedrails?: A Little Help needed moving from lying on your back to sitting on the side of a flat bed without using bedrails?: A Little Help needed moving to and from a bed to a chair (including a wheelchair)?: A Lot Help needed standing up from a chair using your arms (e.g., wheelchair or bedside chair)?: Total Help needed to walk in hospital room?: Total Help needed climbing 3-5 steps with a railing? : Total 6 Click Score: 11    End of Session Equipment Utilized During Treatment: Gait belt Activity Tolerance: Patient tolerated treatment well;Patient limited by pain Patient left: in chair;with call bell/phone within reach;with chair alarm set Nurse Communication: Mobility status PT Visit Diagnosis: Unsteadiness on feet (R26.81);Other abnormalities of gait and mobility (R26.89);Pain Pain - Right/Left: Left Pain - part of body: Leg     Time: 0768-0881 PT Time Calculation (min) (ACUTE ONLY): 34 min  Charges:  $Therapeutic Exercise: 8-22 mins $Therapeutic Activity: 8-22 mins                     Kittie Plater, PT, DPT Acute Rehabilitation Services Pager #: [phone] Office #: [phone]    [place] Lopes 10/23/2020, 2:58 PM

## 2020-10-23 NOTE — TOC Progression Note (Signed)
Transition of Care [facility]) - Progression Note    Patient Details  Name: [name] MRN: [id number] Date of Birth: 01/20/1956  Transition of Care [facility]) CM/SW [place], [place] Phone Number: 10/23/2020, 2:23 PM  Clinical Narrative:     Josem Kaufmann still pending at this time.    Expected Discharge Plan: [place] Barriers to Discharge: Continued Medical Work up  Expected Discharge Plan and Services Expected Discharge Plan: [place] Choice: [place] arrangements for the past 2 months: [place] Expected Discharge Date: 10/23/20                                     Social Determinants of Health (SDOH) Interventions    Readmission Risk Interventions Readmission Risk Prevention Plan 02/27/2020  Post Dischage Appt Complete  Medication Screening Complete  Transportation Screening Complete  Some recent data might be hidden

## 2020-10-23 NOTE — Progress Notes (Signed)
Patient is postop day 6 status post left above-knee amputation.  She is doing well and anticipates discharge to skilled nursing today.  She has agreed not to smoke  Vital signs stable patient is pleasant appropriate to examination good pain control.  Wound VAC is functioning no additional in the canister.  Will discharge patient to skilled nursing we will place orders for both the nicotine patch and pain medication

## 2020-10-23 NOTE — Progress Notes (Signed)
Received patient awake,alert/orientedx4 and able to verbalize needs. NAD noted; respirations easy/even on room air. L AKA noted; wound vac in place at this time with good seal and suction. Movement/sensation to all extremities. Whiteboard updated. All safety measures in place and personal belongings within reach.

## 2020-10-23 NOTE — Discharge Summary (Signed)
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 female who was admitted 10/18/2020 with a chief complaint of left leg osteomyelitis with a final diagnosis of Osteomyelitis Left Tibia.  Patient was brought to the operating room on 10/18/2020 and underwent Procedure(s): LEFT ABOVE KNEE AMPUTATION.    Patient was given perioperative antibiotics:  Anti-infectives (From admission, onward)    Start     Dose/Rate Route Frequency Ordered Stop   10/18/20 1600  ceFAZolin (ANCEF) IVPB 2g/100 mL premix        2 g 200 mL/hr over 30 Minutes Intravenous Every 8 hours 10/18/20 1116 10/18/20 2300   10/18/20 0615  ceFAZolin (ANCEF) IVPB 3g/100 mL premix        3 g 200 mL/hr over 30 Minutes Intravenous On call to O.R. 10/18/20 0600 10/18/20 0830     .  Patient was given sequential compression devices, early ambulation, and aspirin for DVT prophylaxis.  Recent vital signs: Patient Vitals for the past 24 hrs:  BP Temp Temp src Pulse Resp SpO2  10/22/20 2139 (!) 169/83 98 F (36.7 C) Oral 89 18 94 %  10/22/20 1451 (!) 157/81 98 F (36.7 C) Oral 84 17 97 %  10/22/20 0800 (!) 155/76 (!) 97.3 F (36.3 C) Oral 78 17 93 %  .  Recent laboratory studies: No results found.  Discharge Medications:   Allergies as of 10/23/2020       Reactions   Morphine And Related Itching   Tolerates fine with antihistamine (benadryl or cetirizine)    Oxycodone Itching   Tolerates fine with antihistamine (benadryl or cetirizine)         Medication List     STOP taking these medications    doxycycline 100 MG capsule Commonly known as: VIBRAMYCIN   enoxaparin 40 MG/0.4ML injection Commonly known as: LOVENOX       TAKE these medications    acetaminophen 500  MG tablet Commonly known as: TYLENOL Take 1,000 mg by mouth every 6 (six) hours as needed for moderate pain.   ALPRAZolam 0.5 MG tablet Commonly known as: Xanax Take 1 tablet (0.5 mg total) by mouth 3 (three) times daily as needed for sleep or anxiety.   ARIPiprazole 5 MG tablet Commonly known as: ABILIFY Take 1 tablet (5 mg total) by mouth daily.   ascorbic acid 1000 MG tablet Commonly known as: VITAMIN C Take 1 tablet (1,000 mg total) by mouth daily.   celecoxib 200 MG capsule Commonly known as: CELEBREX Take 200 mg by mouth 2 (two) times daily.   cetirizine 10 MG tablet Commonly known as: ZYRTEC Take 10 mg by mouth daily as needed for allergies.   cholecalciferol 25 MCG (1000 UNIT) tablet Commonly known as: VITAMIN D3 Take 1,000 Units by mouth daily.   cyclobenzaprine 10 MG tablet Commonly known as: FLEXERIL Take 1 tablet (10 mg total) by mouth 3 (three) times daily as needed for muscle spasms.   diphenhydrAMINE 25 mg capsule Commonly known as: BENADRYL Take 1 capsule (25 mg total) by mouth every 6 (six) hours as needed for itching (if pain meds cause itching).   docusate sodium 100 MG capsule Commonly known as: COLACE Take 100 mg by mouth daily as needed for mild constipation.   famotidine 20  MG tablet Commonly known as: PEPCID Take 20 mg by mouth daily as needed for heartburn or indigestion.   furosemide 20 MG tablet Commonly known as: LASIX Take 20 mg by mouth daily.   gabapentin 800 MG tablet Commonly known as: NEURONTIN Take 1 tablet (800 mg total) by mouth 3 (three) times daily.   liver oil-zinc oxide 40 % ointment Commonly known as: DESITIN Apply 1 application topically daily as needed for irritation.   Melatonin 5 MG Caps Take 5 mg by mouth at bedtime.   nicotine 7 mg/24hr patch Commonly known as: NICODERM CQ - dosed in mg/24 hr Place 1 patch (7 mg total) onto the skin daily.   nutrition supplement (JUVEN) Pack Take 1 packet by mouth 2 (two)  times daily between meals.   nystatin cream Commonly known as: MYCOSTATIN Apply 1 application topically 3 (three) times daily as needed (irritation).   omeprazole 20 MG capsule Commonly known as: PRILOSEC Take 20 mg by mouth daily.   oxyCODONE 5 MG immediate release tablet Commonly known as: Oxy IR/ROXICODONE Take 1-2 tablets (5-10 mg total) by mouth every 4 (four) hours as needed for moderate pain (pain score 4-6). What changed:  medication strength how much to take how to take this when to take this reasons to take this additional instructions   potassium chloride SA 20 MEQ tablet Commonly known as: KLOR-CON Take 20 mEq by mouth daily.   zinc sulfate 220 (50 Zn) MG capsule Take 1 capsule (220 mg total) by mouth daily.        Diagnostic Studies: XR Knee 1-2 Views Left  Result Date: 10/15/2020 2 view radiographs of the left knee shows collapse of the proximal tibial medial metaphysis.  No evidence of any lytic destructive changes of the femur.  The femoral nail is in place with bent screws.  LE VENOUS  Result Date: 10/08/2020  Lower Venous DVT Study Patient Name:  [name]  Date of Exam:   10/08/2020 Medical Rec #: [id number]    Accession #:    [id number] Date of Birth: July 09, 1955     Patient Gender: F Patient Age:   64Y Exam Location:  [facility] Procedure:      VAS [place] LOWER EXTREMITY VENOUS (DVT) Referring Phys: [id number] CYNTHIA SNIDER --------------------------------------------------------------------------------  Indications: Pain, Swelling, Edema, and Erythema.  Risk Factors: Surgery OPEN REDUCTION INTERNAL FIXATION (ORIF) TIBIAL PLATEAU. Anticoagulation: Lovenox. Limitations: Body habitus, poor ultrasound/tissue interface, bandages and open wound. Comparison Study: 03/15/2020 duplex Limited study, negative for left leg DVT                   03/03/2020 duplex limited study, negative for DVT bilaterally. Performing Technologist: Delorise Shiner RVT   Examination Guidelines: A complete evaluation includes B-mode imaging, spectral Doppler, color Doppler, and power Doppler as needed of all accessible portions of each vessel. Bilateral testing is considered an integral part of a complete examination. Limited examinations for reoccurring indications may be performed as noted. The reflux portion of the exam is performed with the patient in reverse Trendelenburg.  +-----+---------------+---------+-----------+----------+--------------+ RIGHTCompressibilityPhasicitySpontaneityPropertiesThrombus Aging +-----+---------------+---------+-----------+----------+--------------+ CFV  Full           Yes      Yes                                 +-----+---------------+---------+-----------+----------+--------------+   +---------+---------------+---------+-----------+----------+-------------------+ LEFT     CompressibilityPhasicitySpontaneityPropertiesThrombus Aging      +---------+---------------+---------+-----------+----------+-------------------+  CFV      Full           Yes      Yes                                      +---------+---------------+---------+-----------+----------+-------------------+ SFJ      Full           Yes      Yes                                      +---------+---------------+---------+-----------+----------+-------------------+ FV Prox  Full           Yes      Yes                                      +---------+---------------+---------+-----------+----------+-------------------+ FV Mid   Full           Yes      Yes                                      +---------+---------------+---------+-----------+----------+-------------------+ FV Distal                                             Not well visualized +---------+---------------+---------+-----------+----------+-------------------+ PFV      Full           Yes      Yes                                       +---------+---------------+---------+-----------+----------+-------------------+ POP                                                   Not well visualized +---------+---------------+---------+-----------+----------+-------------------+ PTV      Full                                                             +---------+---------------+---------+-----------+----------+-------------------+ PERO                                                  Not well visualized +---------+---------------+---------+-----------+----------+-------------------+ GSV      Full                    Yes                                      +---------+---------------+---------+-----------+----------+-------------------+ SSV  Not well visualized +---------+---------------+---------+-----------+----------+-------------------+ No obvious DVT. Limited imaging of mid to distal femoral vein. Popliteal vein coud not be visualized.   Findings reported to Left voicemail at 16:30.  Summary: RIGHT: - No evidence of common femoral vein obstruction.  LEFT: - Findings appear essentially unchanged compared to previous examination. - There is no evidence of deep vein thrombosis in the lower extremity. However, portions of this examination were limited- see technologist comments above.  - No cystic structure found in the popliteal fossa.  *See table(s) above for measurements and observations. Electronically signed by Monica Martinez MD on 10/08/2020 at 5:36:28 PM.    Final     Patient benefited maximally from their hospital stay and there were no complications.     Disposition: Discharge disposition: 03-Skilled Nursing Facility      Discharge Instructions     Call MD / Call 911   Complete by: As directed    If you experience chest pain or shortness of breath, CALL 911 and be transported to the hospital emergency room.  If you develope a fever above 101 F, pus  (white drainage) or increased drainage or redness at the wound, or calf pain, call your surgeon's office.   Constipation Prevention   Complete by: As directed    Drink plenty of fluids.  Prune juice may be helpful.  You may use a stool softener, such as Colace (over the counter) 100 mg twice a day.  Use MiraLax (over the counter) for constipation as needed.   Diet - low sodium heart healthy   Complete by: As directed    Increase activity slowly as tolerated   Complete by: As directed    Negative Pressure Wound Therapy - Incisional   Complete by: As directed    Show patient how to attach preveena vac   Post-operative opioid taper instructions:   Complete by: As directed    POST-OPERATIVE OPIOID TAPER INSTRUCTIONS: It is important to wean off of your opioid medication as soon as possible. If you do not need pain medication after your surgery it is ok to stop day one. Opioids include: Codeine, Hydrocodone(Norco, Vicodin), Oxycodone(Percocet, oxycontin) and hydromorphone amongst others.  Long term and even short term use of opiods can cause: Increased pain response Dependence Constipation Depression Respiratory depression And more.  Withdrawal symptoms can include Flu like symptoms Nausea, vomiting And more Techniques to manage these symptoms Hydrate well Eat regular healthy meals Stay active Use relaxation techniques(deep breathing, meditating, yoga) Do Not substitute Alcohol to help with tapering If you have been on opioids for less than two weeks and do not have pain than it is ok to stop all together.  Plan to wean off of opioids This plan should start within one week post op of your joint replacement. Maintain the same interval or time between taking each dose and first decrease the dose.  Cut the total daily intake of opioids by one tablet each day Next start to increase the time between doses. The last dose that should be eliminated is the evening dose.           Follow-up Information     [facility], Bevely Palmer, [place] Follow up in 1 week(s).   Specialty: Orthopedic Surgery Contact information: [address] [place] [address] [phone]                  Signed: Bevely Palmer Roosevelt Eimers 10/23/2020, 7:21 AM

## 2020-10-24 NOTE — Progress Notes (Signed)
Patient is 6 days status post above-knee amputation.  Was supposed to be discharged to nursing facility yesterday but was pending insurance authorization patient is an employee of [employer] and did speak with their utilization review and is confident that she has insurance approval  Vital signs stable alert pleasant to exam wound VAC is functioning with 1 green check 0 cc in the canister  Plan for discharge to skilled nursing today with 1 week follow-up in our office prescriptions for pain medication as well as nicotine patch have been written and are on her chart

## 2020-10-24 NOTE — Discharge Summary (Signed)
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 female who was admitted 10/18/2020 with a chief complaint of left knee osteomyelitis, with a final diagnosis of Osteomyelitis Left Tibia.  Patient was brought to the operating room on 10/18/2020 and underwent Procedure(s): LEFT ABOVE KNEE AMPUTATION.    Patient was given perioperative antibiotics:  Anti-infectives (From admission, onward)    Start     Dose/Rate Route Frequency Ordered Stop   10/18/20 1600  ceFAZolin (ANCEF) IVPB 2g/100 mL premix        2 g 200 mL/hr over 30 Minutes Intravenous Every 8 hours 10/18/20 1116 10/18/20 2300   10/18/20 0615  ceFAZolin (ANCEF) IVPB 3g/100 mL premix        3 g 200 mL/hr over 30 Minutes Intravenous On call to O.R. 10/18/20 0600 10/18/20 0830     .  Patient was given sequential compression devices, early ambulation, and aspirin for DVT prophylaxis.  Recent vital signs: Patient Vitals for the past 24 hrs:  BP Temp Temp src Pulse Resp SpO2  10/24/20 0734 (!) 147/86 98.1 F (36.7 C) Oral 73 -- 97 %  10/23/20 2135 (!) 155/76 98.7 F (37.1 C) Oral 82 18 98 %  10/23/20 1523 (!) 151/69 98.2 F (36.8 C) Oral 73 17 95 %  .  Recent laboratory studies: No results found.  Discharge Medications:   Allergies as of 10/24/2020       Reactions   Morphine And Related Itching   Tolerates fine with antihistamine (benadryl or cetirizine)    Oxycodone Itching   Tolerates fine with antihistamine (benadryl or cetirizine)         Medication List     STOP taking these medications    ALPRAZolam 0.5 MG tablet Commonly known as: Xanax   doxycycline 100 MG capsule Commonly known as: VIBRAMYCIN   enoxaparin 40 MG/0.4ML injection Commonly known as:  LOVENOX       TAKE these medications    acetaminophen 500 MG tablet Commonly known as: TYLENOL Take 1,000 mg by mouth every 6 (six) hours as needed for moderate pain.   ARIPiprazole 5 MG tablet Commonly known as: ABILIFY Take 1 tablet (5 mg total) by mouth daily.   ascorbic acid 1000 MG tablet Commonly known as: VITAMIN C Take 1 tablet (1,000 mg total) by mouth daily.   celecoxib 200 MG capsule Commonly known as: CELEBREX Take 200 mg by mouth 2 (two) times daily.   cetirizine 10 MG tablet Commonly known as: ZYRTEC Take 10 mg by mouth daily as needed for allergies.   cholecalciferol 25 MCG (1000 UNIT) tablet Commonly known as: VITAMIN D3 Take 1,000 Units by mouth daily.   cyclobenzaprine 10 MG tablet Commonly known as: FLEXERIL Take 1 tablet (10 mg total) by mouth 3 (three) times daily as needed for muscle spasms.   diphenhydrAMINE 25 mg capsule Commonly known as: BENADRYL Take 1 capsule (25 mg total) by mouth every 6 (six) hours as needed for itching (if pain meds cause itching).   docusate sodium 100 MG capsule Commonly known as: COLACE Take 100 mg by mouth daily as needed for mild constipation.   famotidine 20 MG tablet Commonly known as: PEPCID Take 20 mg by mouth daily as needed for heartburn or indigestion.  furosemide 20 MG tablet Commonly known as: LASIX Take 20 mg by mouth daily.   gabapentin 800 MG tablet Commonly known as: NEURONTIN Take 1 tablet (800 mg total) by mouth 3 (three) times daily.   liver oil-zinc oxide 40 % ointment Commonly known as: DESITIN Apply 1 application topically daily as needed for irritation.   Melatonin 5 MG Caps Take 5 mg by mouth at bedtime.   nicotine 7 mg/24hr patch Commonly known as: NICODERM CQ - dosed in mg/24 hr Place 1 patch (7 mg total) onto the skin daily.   nutrition supplement (JUVEN) Pack Take 1 packet by mouth 2 (two) times daily between meals.   nystatin cream Commonly known as: MYCOSTATIN Apply  1 application topically 3 (three) times daily as needed (irritation).   omeprazole 20 MG capsule Commonly known as: PRILOSEC Take 20 mg by mouth daily.   oxyCODONE 5 MG immediate release tablet Commonly known as: Oxy IR/ROXICODONE Take 1-2 tablets (5-10 mg total) by mouth every 4 (four) hours as needed for moderate pain (pain score 4-6). What changed:  medication strength how much to take how to take this when to take this reasons to take this additional instructions   potassium chloride SA 20 MEQ tablet Commonly known as: KLOR-CON Take 20 mEq by mouth daily.   zinc sulfate 220 (50 Zn) MG capsule Take 1 capsule (220 mg total) by mouth daily.        Diagnostic Studies: XR Knee 1-2 Views Left  Result Date: 10/15/2020 2 view radiographs of the left knee shows collapse of the proximal tibial medial metaphysis.  No evidence of any lytic destructive changes of the femur.  The femoral nail is in place with bent screws.  LE VENOUS  Result Date: 10/08/2020  Lower Venous DVT Study Patient Name:  [name]  Date of Exam:   10/08/2020 Medical Rec #: [id number]    Accession #:    [id number] Date of Birth: 1956-01-25     Patient Gender: F Patient Age:   64Y Exam Location:  [facility] Procedure:      VAS [place] LOWER EXTREMITY VENOUS (DVT) Referring Phys: [id number] CYNTHIA SNIDER --------------------------------------------------------------------------------  Indications: Pain, Swelling, Edema, and Erythema.  Risk Factors: Surgery OPEN REDUCTION INTERNAL FIXATION (ORIF) TIBIAL PLATEAU. Anticoagulation: Lovenox. Limitations: Body habitus, poor ultrasound/tissue interface, bandages and open wound. Comparison Study: 03/15/2020 duplex Limited study, negative for left leg DVT                   03/03/2020 duplex limited study, negative for DVT bilaterally. Performing Technologist: Delorise Shiner RVT  Examination Guidelines: A complete evaluation includes B-mode imaging, spectral Doppler, color  Doppler, and power Doppler as needed of all accessible portions of each vessel. Bilateral testing is considered an integral part of a complete examination. Limited examinations for reoccurring indications may be performed as noted. The reflux portion of the exam is performed with the patient in reverse Trendelenburg.  +-----+---------------+---------+-----------+----------+--------------+ RIGHTCompressibilityPhasicitySpontaneityPropertiesThrombus Aging +-----+---------------+---------+-----------+----------+--------------+ CFV  Full           Yes      Yes                                 +-----+---------------+---------+-----------+----------+--------------+   +---------+---------------+---------+-----------+----------+-------------------+ LEFT     CompressibilityPhasicitySpontaneityPropertiesThrombus Aging      +---------+---------------+---------+-----------+----------+-------------------+ CFV      Full           Yes  Yes                                      +---------+---------------+---------+-----------+----------+-------------------+ SFJ      Full           Yes      Yes                                      +---------+---------------+---------+-----------+----------+-------------------+ FV Prox  Full           Yes      Yes                                      +---------+---------------+---------+-----------+----------+-------------------+ FV Mid   Full           Yes      Yes                                      +---------+---------------+---------+-----------+----------+-------------------+ FV Distal                                             Not well visualized +---------+---------------+---------+-----------+----------+-------------------+ PFV      Full           Yes      Yes                                      +---------+---------------+---------+-----------+----------+-------------------+ POP                                                    Not well visualized +---------+---------------+---------+-----------+----------+-------------------+ PTV      Full                                                             +---------+---------------+---------+-----------+----------+-------------------+ PERO                                                  Not well visualized +---------+---------------+---------+-----------+----------+-------------------+ GSV      Full                    Yes                                      +---------+---------------+---------+-----------+----------+-------------------+ SSV  Not well visualized +---------+---------------+---------+-----------+----------+-------------------+ No obvious DVT. Limited imaging of mid to distal femoral vein. Popliteal vein coud not be visualized.   Findings reported to Left voicemail at 16:30.  Summary: RIGHT: - No evidence of common femoral vein obstruction.  LEFT: - Findings appear essentially unchanged compared to previous examination. - There is no evidence of deep vein thrombosis in the lower extremity. However, portions of this examination were limited- see technologist comments above.  - No cystic structure found in the popliteal fossa.  *See table(s) above for measurements and observations. Electronically signed by Monica Martinez MD on 10/08/2020 at 5:36:28 PM.    Final     Patient benefited maximally from their hospital stay and there were no complications.     Disposition: Discharge disposition: 03-Skilled Nursing Facility      Discharge Instructions     Call MD / Call 911   Complete by: As directed    If you experience chest pain or shortness of breath, CALL 911 and be transported to the hospital emergency room.  If you develope a fever above 101 F, pus (white drainage) or increased drainage or redness at the wound, or calf pain, call your surgeon's office.   Call MD / Call 911    Complete by: As directed    If you experience chest pain or shortness of breath, CALL 911 and be transported to the hospital emergency room.  If you develope a fever above 101 F, pus (white drainage) or increased drainage or redness at the wound, or calf pain, call your surgeon's office.   Constipation Prevention   Complete by: As directed    Drink plenty of fluids.  Prune juice may be helpful.  You may use a stool softener, such as Colace (over the counter) 100 mg twice a day.  Use MiraLax (over the counter) for constipation as needed.   Constipation Prevention   Complete by: As directed    Drink plenty of fluids.  Prune juice may be helpful.  You may use a stool softener, such as Colace (over the counter) 100 mg twice a day.  Use MiraLax (over the counter) for constipation as needed.   Diet - low sodium heart healthy   Complete by: As directed    Diet - low sodium heart healthy   Complete by: As directed    Increase activity slowly as tolerated   Complete by: As directed    Increase activity slowly as tolerated   Complete by: As directed    Negative Pressure Wound Therapy - Incisional   Complete by: As directed    Show patient how to attach preveena vac   Post-operative opioid taper instructions:   Complete by: As directed    POST-OPERATIVE OPIOID TAPER INSTRUCTIONS: It is important to wean off of your opioid medication as soon as possible. If you do not need pain medication after your surgery it is ok to stop day one. Opioids include: Codeine, Hydrocodone(Norco, Vicodin), Oxycodone(Percocet, oxycontin) and hydromorphone amongst others.  Long term and even short term use of opiods can cause: Increased pain response Dependence Constipation Depression Respiratory depression And more.  Withdrawal symptoms can include Flu like symptoms Nausea, vomiting And more Techniques to manage these symptoms Hydrate well Eat regular healthy meals Stay active Use relaxation techniques(deep  breathing, meditating, yoga) Do Not substitute Alcohol to help with tapering If you have been on opioids for less than two weeks and do not have pain than it is ok to stop all together.  Plan to wean off of opioids This plan should start within one week post op of your joint replacement. Maintain the same interval or time between taking each dose and first decrease the dose.  Cut the total daily intake of opioids by one tablet each day Next start to increase the time between doses. The last dose that should be eliminated is the evening dose.      Post-operative opioid taper instructions:   Complete by: As directed    POST-OPERATIVE OPIOID TAPER INSTRUCTIONS: It is important to wean off of your opioid medication as soon as possible. If you do not need pain medication after your surgery it is ok to stop day one. Opioids include: Codeine, Hydrocodone(Norco, Vicodin), Oxycodone(Percocet, oxycontin) and hydromorphone amongst others.  Long term and even short term use of opiods can cause: Increased pain response Dependence Constipation Depression Respiratory depression And more.  Withdrawal symptoms can include Flu like symptoms Nausea, vomiting And more Techniques to manage these symptoms Hydrate well Eat regular healthy meals Stay active Use relaxation techniques(deep breathing, meditating, yoga) Do Not substitute Alcohol to help with tapering If you have been on opioids for less than two weeks and do not have pain than it is ok to stop all together.  Plan to wean off of opioids This plan should start within one week post op of your joint replacement. Maintain the same interval or time between taking each dose and first decrease the dose.  Cut the total daily intake of opioids by one tablet each day Next start to increase the time between doses. The last dose that should be eliminated is the evening dose.          Follow-up Information     Alean Kromer, Bevely Palmer, [place] Follow up  in 1 week(s).   Specialty: Orthopedic Surgery Contact information: [address] [place] [address] [phone]                  Signed: Bevely Palmer Shadiyah Wernli 10/24/2020, 10:49 AM

## 2020-10-24 NOTE — TOC Transition Note (Signed)
Transition of Care [facility]) - CM/SW Discharge Note   Patient Details  Name: [name] MRN: [id number] Date of Birth: 07/06/1955  [facility]) CM/SW Contact:  Bethann Berkshire, [place] Phone Number: 10/24/2020, 10:55 AM   Clinical Narrative:     Patient will DC to: [place] Anticipated DC date: 10/24/20 Transport by: Corey Harold   Per MD patient ready for DC to [facility]. RN, patient,and facility notified of DC. Discharge Summary and FL2 sent to facility. RN to call report prior to discharge ([phone] Room 127). DC packet on chart. Ambulance transport requested for patient.   CSW will sign off for now as social work intervention is no longer needed. Please consult [place] again if new needs arise.   Final next level of care: Skilled Nursing Facility Barriers to Discharge: No Barriers Identified   Patient Goals and CMS Choice     Choice offered to / list presented to : Patient  Discharge Placement              Patient chooses bed at: [facility] Patient to be transferred to facility by: [facility]   Patient and family notified of of transfer: 10/24/20  Discharge Plan and Services     Post Acute Care Choice: [place]                               Social Determinants of Health (SDOH) Interventions     Readmission Risk Interventions Readmission Risk Prevention Plan 02/27/2020  Post Dischage Appt Complete  Medication Screening Complete  Transportation Screening Complete  Some recent data might be hidden

## 2020-10-24 NOTE — Plan of Care (Signed)
  Problem: Activity: Goal: Risk for activity intolerance will decrease Outcome: Adequate for Discharge   Problem: Pain Managment: Goal: General experience of comfort will improve Outcome: Adequate for Discharge   Problem: Safety: Goal: Ability to remain free from injury will improve Outcome: Adequate for Discharge   

## 2020-10-24 NOTE — Progress Notes (Signed)
Report called to [facility]. Updates for RN who already knew pt. Pt has stump shrinker on. Will switch to preevena when [facility] gets here to pick her up. All belongings gathered to be sent with her.

## 2020-10-24 NOTE — Progress Notes (Signed)
Occupational Therapy Treatment Patient Details Name: [name] MRN: [id number] DOB: [birth year] Today's Date: 10/24/2020    History of present illness The pt is a 65 yo female admitted 7/1 s/p AKA due to osteomyelitis of L tibia. PMH includes L femur IMN, HTN, obesity, bipolar disorder, ETOH use.   OT comments  Patient continues to progress toward patient focused OT goals.  She is needing less assist for basic bed mobility, and edge of bed balance is improving.  See below for details.  Barriers are listed below as well.  SNF is appropriate for post acute rehab given transfer status and the need for 24 hour care currently.  OT will continue to follow in the acute setting.    Follow Up Recommendations  SNF;Supervision - Intermittent    Equipment Recommendations  3 in 1 bedside commode    Recommendations for Other Services      Precautions / Restrictions Precautions Precautions: Fall Precaution Comments: wound vac Restrictions Weight Bearing Restrictions: Yes LLE Weight Bearing: Non weight bearing       Mobility Bed Mobility   Bed Mobility: Supine to Sit;Sit to Supine     Supine to sit: Supervision;HOB elevated Sit to supine: Min guard   General bed mobility comments: heavy use of SR's, able to pull herself up higher in the bed without assist. Patient Response: Cooperative  Transfers                Lateral/Scoot Transfers: Min guard General transfer comment: to the head of bed and back to the middle of the bed    Balance Overall balance assessment: Needs assistance Sitting-balance support: Feet supported Sitting balance-Leahy Scale: Good                                                          Cognition Arousal/Alertness: Awake/alert Behavior During Therapy: Anxious;WFL for tasks assessed/performed Overall Cognitive Status: Within Functional Limits for tasks assessed                                           Exercises Other Exercises Other Exercises: Chair pusups at side of bed: 4 sets and 4 reps each. Other Exercises: leaning side to side for core activation and sitting balance to increase ADL independence.   Shoulder Instructions       General Comments      Pertinent Vitals/ Pain       Pain Assessment: Faces Faces Pain Scale: Hurts little more Pain Location: L residual limb Pain Descriptors / Indicators: Burning;Aching;Tender;Throbbing Pain Intervention(s): Monitored during session                                                          Frequency  Min 2X/week        Progress Toward Goals  OT Goals(current goals can now be found in the care plan section)  Progress towards OT goals: Progressing toward goals  Acute Rehab OT Goals Patient Stated Goal: I know I need to move more OT Goal Formulation: With patient Time  For Goal Achievement: 11/02/20 Potential to Achieve Goals: [place] Discharge plan remains appropriate    Co-evaluation                 AM-PAC OT "6 Clicks" Daily Activity     Outcome Measure   Help from another person eating meals?: None Help from another person taking care of personal grooming?: A Little Help from another person toileting, which includes using toliet, bedpan, or urinal?: A Lot Help from another person bathing (including washing, rinsing, drying)?: A Lot Help from another person to put on and taking off regular upper body clothing?: A Little Help from another person to put on and taking off regular lower body clothing?: A Lot 6 Click Score: 16    End of Session    OT Visit Diagnosis: Unsteadiness on feet (R26.81);Other abnormalities of gait and mobility (R26.89);Pain Pain - Right/Left: Left Pain - part of body: Leg   Activity Tolerance Patient tolerated treatment well   Patient Left in bed;with call bell/phone within reach   Nurse Communication Mobility status        Time: 4707-6151 OT  Time Calculation (min): 25 min  Charges: OT General Charges $OT Visit: 1 Visit OT Treatments $Therapeutic Activity: 23-37 mins  10/24/2020  Rich, OTR/L  Acute Rehabilitation Services  Office:  [place] 10/24/2020, 10:47 AM

## 2020-10-24 NOTE — Progress Notes (Signed)
Orthopedic Tech Progress Note Patient Details:  [name] [birth year] [id number] Shrinker [place] has been ordered from [place]  Patient ID: [name], female   DOB: 1955/06/09, 65 y.o.   MRN: [id number]  [name] [name] 10/24/2020, 11:35 AM

## 2020-10-24 NOTE — Discharge Summary (Signed)
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 female who was admitted 10/18/2020 with a chief complaint of Left leg osteomyelitis, with a final diagnosis of Osteomyelitis Left Tibia.  Patient was brought to the operating room on 10/18/2020 and underwent Procedure(s): LEFT ABOVE KNEE AMPUTATION.    Patient was given perioperative antibiotics:  Anti-infectives (From admission, onward)    Start     Dose/Rate Route Frequency Ordered Stop   10/18/20 1600  ceFAZolin (ANCEF) IVPB 2g/100 mL premix        2 g 200 mL/hr over 30 Minutes Intravenous Every 8 hours 10/18/20 1116 10/18/20 2300   10/18/20 0615  ceFAZolin (ANCEF) IVPB 3g/100 mL premix        3 g 200 mL/hr over 30 Minutes Intravenous On call to O.R. 10/18/20 0600 10/18/20 0830     .  Patient was given sequential compression devices, early ambulation, and aspirin for DVT prophylaxis.  Recent vital signs: Patient Vitals for the past 24 hrs:  BP Temp Temp src Pulse Resp SpO2  10/23/20 2135 (!) 155/76 98.7 F (37.1 C) Oral 82 18 98 %  10/23/20 1523 (!) 151/69 98.2 F (36.8 C) Oral 73 17 95 %  10/23/20 0755 (!) 170/75 97.8 F (36.6 C) Oral 74 17 96 %  .  Recent laboratory studies: No results found.  Discharge Medications:   Allergies as of 10/24/2020       Reactions   Morphine And Related Itching   Tolerates fine with antihistamine (benadryl or cetirizine)    Oxycodone Itching   Tolerates fine with antihistamine (benadryl or cetirizine)         Medication List     STOP taking these medications    doxycycline 100 MG capsule Commonly known as: VIBRAMYCIN   enoxaparin 40 MG/0.4ML injection Commonly known as: LOVENOX       TAKE these medications    acetaminophen 500  MG tablet Commonly known as: TYLENOL Take 1,000 mg by mouth every 6 (six) hours as needed for moderate pain.   ALPRAZolam 0.5 MG tablet Commonly known as: Xanax Take 1 tablet (0.5 mg total) by mouth 3 (three) times daily as needed for sleep or anxiety.   ARIPiprazole 5 MG tablet Commonly known as: ABILIFY Take 1 tablet (5 mg total) by mouth daily.   ascorbic acid 1000 MG tablet Commonly known as: VITAMIN C Take 1 tablet (1,000 mg total) by mouth daily.   celecoxib 200 MG capsule Commonly known as: CELEBREX Take 200 mg by mouth 2 (two) times daily.   cetirizine 10 MG tablet Commonly known as: ZYRTEC Take 10 mg by mouth daily as needed for allergies.   cholecalciferol 25 MCG (1000 UNIT) tablet Commonly known as: VITAMIN D3 Take 1,000 Units by mouth daily.   cyclobenzaprine 10 MG tablet Commonly known as: FLEXERIL Take 1 tablet (10 mg total) by mouth 3 (three) times daily as needed for muscle spasms.   diphenhydrAMINE 25 mg capsule Commonly known as: BENADRYL Take 1 capsule (25 mg total) by mouth every 6 (six) hours as needed for itching (if pain meds cause itching).   docusate sodium 100 MG capsule Commonly known as: COLACE Take 100 mg by mouth daily as needed for mild constipation.   famotidine 20 MG  tablet Commonly known as: PEPCID Take 20 mg by mouth daily as needed for heartburn or indigestion.   furosemide 20 MG tablet Commonly known as: LASIX Take 20 mg by mouth daily.   gabapentin 800 MG tablet Commonly known as: NEURONTIN Take 1 tablet (800 mg total) by mouth 3 (three) times daily.   liver oil-zinc oxide 40 % ointment Commonly known as: DESITIN Apply 1 application topically daily as needed for irritation.   Melatonin 5 MG Caps Take 5 mg by mouth at bedtime.   nicotine 7 mg/24hr patch Commonly known as: NICODERM CQ - dosed in mg/24 hr Place 1 patch (7 mg total) onto the skin daily.   nutrition supplement (JUVEN) Pack Take 1 packet by mouth 2 (two)  times daily between meals.   nystatin cream Commonly known as: MYCOSTATIN Apply 1 application topically 3 (three) times daily as needed (irritation).   omeprazole 20 MG capsule Commonly known as: PRILOSEC Take 20 mg by mouth daily.   oxyCODONE 5 MG immediate release tablet Commonly known as: Oxy IR/ROXICODONE Take 1-2 tablets (5-10 mg total) by mouth every 4 (four) hours as needed for moderate pain (pain score 4-6). What changed:  medication strength how much to take how to take this when to take this reasons to take this additional instructions   potassium chloride SA 20 MEQ tablet Commonly known as: KLOR-CON Take 20 mEq by mouth daily.   zinc sulfate 220 (50 Zn) MG capsule Take 1 capsule (220 mg total) by mouth daily.        Diagnostic Studies: XR Knee 1-2 Views Left  Result Date: 10/15/2020 2 view radiographs of the left knee shows collapse of the proximal tibial medial metaphysis.  No evidence of any lytic destructive changes of the femur.  The femoral nail is in place with bent screws.  LE VENOUS  Result Date: 10/08/2020  Lower Venous DVT Study Patient Name:  [name]  Date of Exam:   10/08/2020 Medical Rec #: [id number]    Accession #:    [id number] Date of Birth: 18-Aug-1955     Patient Gender: F Patient Age:   64Y Exam Location:  [facility] Procedure:      VAS [place] LOWER EXTREMITY VENOUS (DVT) Referring Phys: [id number] CYNTHIA SNIDER --------------------------------------------------------------------------------  Indications: Pain, Swelling, Edema, and Erythema.  Risk Factors: Surgery OPEN REDUCTION INTERNAL FIXATION (ORIF) TIBIAL PLATEAU. Anticoagulation: Lovenox. Limitations: Body habitus, poor ultrasound/tissue interface, bandages and open wound. Comparison Study: 03/15/2020 duplex Limited study, negative for left leg DVT                   03/03/2020 duplex limited study, negative for DVT bilaterally. Performing Technologist: Delorise Shiner RVT   Examination Guidelines: A complete evaluation includes B-mode imaging, spectral Doppler, color Doppler, and power Doppler as needed of all accessible portions of each vessel. Bilateral testing is considered an integral part of a complete examination. Limited examinations for reoccurring indications may be performed as noted. The reflux portion of the exam is performed with the patient in reverse Trendelenburg.  +-----+---------------+---------+-----------+----------+--------------+ RIGHTCompressibilityPhasicitySpontaneityPropertiesThrombus Aging +-----+---------------+---------+-----------+----------+--------------+ CFV  Full           Yes      Yes                                 +-----+---------------+---------+-----------+----------+--------------+   +---------+---------------+---------+-----------+----------+-------------------+ LEFT     CompressibilityPhasicitySpontaneityPropertiesThrombus Aging      +---------+---------------+---------+-----------+----------+-------------------+ CFV  Full           Yes      Yes                                      +---------+---------------+---------+-----------+----------+-------------------+ SFJ      Full           Yes      Yes                                      +---------+---------------+---------+-----------+----------+-------------------+ FV Prox  Full           Yes      Yes                                      +---------+---------------+---------+-----------+----------+-------------------+ FV Mid   Full           Yes      Yes                                      +---------+---------------+---------+-----------+----------+-------------------+ FV Distal                                             Not well visualized +---------+---------------+---------+-----------+----------+-------------------+ PFV      Full           Yes      Yes                                       +---------+---------------+---------+-----------+----------+-------------------+ POP                                                   Not well visualized +---------+---------------+---------+-----------+----------+-------------------+ PTV      Full                                                             +---------+---------------+---------+-----------+----------+-------------------+ PERO                                                  Not well visualized +---------+---------------+---------+-----------+----------+-------------------+ GSV      Full                    Yes                                      +---------+---------------+---------+-----------+----------+-------------------+ SSV  Not well visualized +---------+---------------+---------+-----------+----------+-------------------+ No obvious DVT. Limited imaging of mid to distal femoral vein. Popliteal vein coud not be visualized.   Findings reported to Left voicemail at 16:30.  Summary: RIGHT: - No evidence of common femoral vein obstruction.  LEFT: - Findings appear essentially unchanged compared to previous examination. - There is no evidence of deep vein thrombosis in the lower extremity. However, portions of this examination were limited- see technologist comments above.  - No cystic structure found in the popliteal fossa.  *See table(s) above for measurements and observations. Electronically signed by Monica Martinez MD on 10/08/2020 at 5:36:28 PM.    Final     Patient benefited maximally from their hospital stay and there were no complications.     Disposition: Discharge disposition: 03-Skilled Nursing Facility      Discharge Instructions     Call MD / Call 911   Complete by: As directed    If you experience chest pain or shortness of breath, CALL 911 and be transported to the hospital emergency room.  If you develope a fever above 101 F, pus  (white drainage) or increased drainage or redness at the wound, or calf pain, call your surgeon's office.   Call MD / Call 911   Complete by: As directed    If you experience chest pain or shortness of breath, CALL 911 and be transported to the hospital emergency room.  If you develope a fever above 101 F, pus (white drainage) or increased drainage or redness at the wound, or calf pain, call your surgeon's office.   Constipation Prevention   Complete by: As directed    Drink plenty of fluids.  Prune juice may be helpful.  You may use a stool softener, such as Colace (over the counter) 100 mg twice a day.  Use MiraLax (over the counter) for constipation as needed.   Constipation Prevention   Complete by: As directed    Drink plenty of fluids.  Prune juice may be helpful.  You may use a stool softener, such as Colace (over the counter) 100 mg twice a day.  Use MiraLax (over the counter) for constipation as needed.   Diet - low sodium heart healthy   Complete by: As directed    Diet - low sodium heart healthy   Complete by: As directed    Increase activity slowly as tolerated   Complete by: As directed    Increase activity slowly as tolerated   Complete by: As directed    Negative Pressure Wound Therapy - Incisional   Complete by: As directed    Show patient how to attach preveena vac   Post-operative opioid taper instructions:   Complete by: As directed    POST-OPERATIVE OPIOID TAPER INSTRUCTIONS: It is important to wean off of your opioid medication as soon as possible. If you do not need pain medication after your surgery it is ok to stop day one. Opioids include: Codeine, Hydrocodone(Norco, Vicodin), Oxycodone(Percocet, oxycontin) and hydromorphone amongst others.  Long term and even short term use of opiods can cause: Increased pain response Dependence Constipation Depression Respiratory depression And more.  Withdrawal symptoms can include Flu like symptoms Nausea,  vomiting And more Techniques to manage these symptoms Hydrate well Eat regular healthy meals Stay active Use relaxation techniques(deep breathing, meditating, yoga) Do Not substitute Alcohol to help with tapering If you have been on opioids for less than two weeks and do not have pain than it is ok to stop all together.  Plan to wean off of opioids This plan should start within one week post op of your joint replacement. Maintain the same interval or time between taking each dose and first decrease the dose.  Cut the total daily intake of opioids by one tablet each day Next start to increase the time between doses. The last dose that should be eliminated is the evening dose.      Post-operative opioid taper instructions:   Complete by: As directed    POST-OPERATIVE OPIOID TAPER INSTRUCTIONS: It is important to wean off of your opioid medication as soon as possible. If you do not need pain medication after your surgery it is ok to stop day one. Opioids include: Codeine, Hydrocodone(Norco, Vicodin), Oxycodone(Percocet, oxycontin) and hydromorphone amongst others.  Long term and even short term use of opiods can cause: Increased pain response Dependence Constipation Depression Respiratory depression And more.  Withdrawal symptoms can include Flu like symptoms Nausea, vomiting And more Techniques to manage these symptoms Hydrate well Eat regular healthy meals Stay active Use relaxation techniques(deep breathing, meditating, yoga) Do Not substitute Alcohol to help with tapering If you have been on opioids for less than two weeks and do not have pain than it is ok to stop all together.  Plan to wean off of opioids This plan should start within one week post op of your joint replacement. Maintain the same interval or time between taking each dose and first decrease the dose.  Cut the total daily intake of opioids by one tablet each day Next start to increase the time between  doses. The last dose that should be eliminated is the evening dose.          Follow-up Information     Maguire Sime, Bevely Palmer, [place] Follow up in 1 week(s).   Specialty: Orthopedic Surgery Contact information: [address] [place] [address] [phone]                  Signed: Bevely Palmer Vishal Sandlin 10/24/2020, 6:50 AM

## 2020-10-30 LAB — FUNGUS CULTURE WITH STAIN

## 2020-10-30 LAB — FUNGUS CULTURE RESULT

## 2020-10-30 LAB — FUNGAL ORGANISM REFLEX

## 2020-10-31 ENCOUNTER — Ambulatory Visit (INDEPENDENT_AMBULATORY_CARE_PROVIDER_SITE_OTHER): Payer: BC Managed Care – PPO | Admitting: Physician Assistant

## 2020-10-31 ENCOUNTER — Encounter: Payer: Self-pay | Admitting: Physician Assistant

## 2020-10-31 DIAGNOSIS — M86462 Chronic osteomyelitis with draining sinus, left tibia and fibula: Secondary | ICD-10-CM

## 2020-10-31 NOTE — Progress Notes (Signed)
Office Visit Note   [name]           Date of Birth: 05-21-1955           MRN: [id number] Visit Date: 10/31/2020              Requested by: [name], MD [address],  [place] [address] PCP: [name], MD  Chief Complaint  Patient presents with   Left Knee - Routine Post Op      HPI: Is a pleasant 65 year old woman who is status post above-knee amputation 13 days.  She is overall doing well she is residing at a nursing facility.  She is anxious to return home  Assessment & Plan: Visit Diagnoses: No diagnosis found.  Plan: Begin daily cleansing with antibacterial soap and water.  Apply dry dressing and shrinker.  We discussed that I expect that her sutures and staples will be removed in 1 to 2 weeks.  And she will begin working with [employer] for her prosthetic.  Follow-Up Instructions: No follow-ups on file.   Ortho Exam  Patient is alert, oriented, no adenopathy, well-dressed, normal affect, normal respiratory effort. Examination wound VAC was removed today.  She does have some skin maceration as would be expected.  Mild bloody drainage in the central portion on the bottom soft there is some blister.  No ascending cellulitis or signs of infection.  Moderate soft tissue swelling  Imaging: No results found. No images are attached to the encounter.  Labs: Lab Results  Component Value Date   HGBA1C 5.7 (H) 09/13/2020   ESRSEDRATE 17 10/07/2020   CRP 42.9 (H) 10/07/2020   REPTSTATUS 09/16/2020 FINAL 09/11/2020   GRAMSTAIN  09/11/2020    RARE WBC PRESENT, PREDOMINANTLY PMN NO ORGANISMS SEEN    CULT  09/11/2020    No growth aerobically or anaerobically. Performed at [facility], [place] [address] [place], [place] [address]      Lab Results  Component Value Date   ALBUMIN 3.0 (L) 03/04/2020   ALBUMIN 3.4 (L) 02/25/2020   ALBUMIN 3.9 01/17/2008    Lab Results  Component Value Date   MG 2.1 10/07/2020   Lab  Results  Component Value Date   VD25OH 28.94 (L) 03/15/2020   VD25OH 18.03 (L) 02/25/2020    No results found for: PREALBUMIN CBC EXTENDED Latest Ref Rng & Units 10/20/2020 10/19/2020 10/07/2020  WBC 4.0 - 10.5 K/uL 8.2 10.0 8.9  RBC 3.87 - 5.11 MIL/uL 3.38(L) 3.58(L) 3.98  HGB 12.0 - 15.0 g/dL 9.6(L) 10.3(L) 11.5(L)  HCT 36.0 - 46.0 % 30.5(L) 33.4(L) 36.3  PLT 150 - 400 K/uL 291 232 269  NEUTROABS 1,500 - 7,800 cells/uL - - 7,102  LYMPHSABS 850 - 3,900 cells/uL - - 828(L)     There is no height or weight on file to calculate BMI.  Orders:  No orders of the defined types were placed in this encounter.  No orders of the defined types were placed in this encounter.    Procedures: No procedures performed  Clinical Data: No additional findings.  ROS:  All other systems negative, except as noted in the HPI. Review of Systems  Objective: Vital Signs: There were no vitals taken for this visit.  Specialty Comments:  No specialty comments available.  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 (  [place])    Arthritis of left knee 09/20/2020   Septic arthritis of knee, left ([place]) 09/17/2020   Cellulitis and abscess of lower extremity 09/11/2020   Debility 05/02/2020   Urinary incontinence 05/02/2020   Pain in left shin    Urinary frequency    Sleep disturbance    Pain    Anxiety state    Urinary urgency    Drug induced constipation    Post-operative pain    Benign essential HTN    Pruritus    Left medial tibial plateau fracture 03/01/2020   Morbid obesity ([place])    Acute blood loss anemia    Postoperative pain    Tibial plateau fracture 02/25/2020   Tibial plateau fracture, left 02/25/2020   Hyponatremia 02/25/2020   Alcohol use disorder, moderate, dependence ([place]) 02/25/2020   Essential hypertension 02/25/2020   Past Medical History:   Diagnosis Date   Anxiety    Arthritis    Depression     No family history on file.  Past Surgical History:  Procedure Laterality Date   ABDOMINAL HYSTERECTOMY     AMPUTATION Left 10/18/2020   Procedure: LEFT ABOVE KNEE AMPUTATION;  Surgeon: Newt Minion, MD;  Location: [facility];  Service: Orthopedics;  Laterality: Left;   BREAST SURGERY     left lumpectomy    gastric bypass surgery      HARDWARE REMOVAL Left 08/28/2020   Procedure: HARDWARE REMOVAL;  Surgeon: Hiram Gash, MD;  Location: [facility];  Service: Orthopedics;  Laterality: Left;   hemorrhjoid surgery      IRRIGATION AND DEBRIDEMENT ABSCESS Left 09/11/2020   Procedure: Irrigation and debridement of deep abscess- left leg, septic joint;  Surgeon: Hiram Gash, MD;  Location: [facility];  Service: Orthopedics;  Laterality: Left;   KNEE ARTHROSCOPY WITH MEDIAL MENISECTOMY Left 08/28/2020   Procedure: KNEE ARTHROSCOPY WITH CHONDROPLASTY, MANIPULATION WITH LYSIS OF ADHESIONS, POSSIBLE MEDIAL MENISECTOMY;  Surgeon: Hiram Gash, MD;  Location: [facility];  Service: Orthopedics;  Laterality: Left;   left femur fracture surgery      x 2   ORIF TIBIA PLATEAU Left 02/26/2020   Procedure: OPEN REDUCTION INTERNAL FIXATION (ORIF) TIBIAL PLATEAU;  Surgeon: Shona Needles, MD;  Location: [facility];  Service: Orthopedics;  Laterality: Left;   TUBAL LIGATION     Social History   Occupational History   Not on file  Tobacco Use   Smoking status: Every Day    Types: Cigars   Smokeless tobacco: Never  Vaping Use   Vaping Use: Former   Substances: Nicotine, Flavoring  Substance and Sexual Activity   Alcohol use: Yes    Alcohol/week: 18.0 standard drinks    Types: 18 Cans of beer per week    Comment: 18 beers per week   Drug use: Never   Sexual activity: Not on file

## 2020-11-05 ENCOUNTER — Telehealth: Payer: Self-pay

## 2020-11-05 NOTE — Telephone Encounter (Signed)
I called and lm to return call. Pt is sch for an appt on Thursday did not know if she was wanting me to move appt up to today or tomorrow?

## 2020-11-05 NOTE — Telephone Encounter (Signed)
Shay with [facility] would like a call back concerning patient?  Stated that patient's left AKA is dehiscing.  Cb# [phone].  Please advise.  Thank you.

## 2020-11-05 NOTE — Telephone Encounter (Signed)
Appt moved to [employer].

## 2020-11-06 ENCOUNTER — Ambulatory Visit (INDEPENDENT_AMBULATORY_CARE_PROVIDER_SITE_OTHER): Payer: BC Managed Care – PPO | Admitting: Family

## 2020-11-06 ENCOUNTER — Encounter: Payer: Self-pay | Admitting: Family

## 2020-11-06 DIAGNOSIS — S78112A Complete traumatic amputation at level between left hip and knee, initial encounter: Secondary | ICD-10-CM | POA: Insufficient documentation

## 2020-11-06 DIAGNOSIS — Z89612 Acquired absence of left leg above knee: Secondary | ICD-10-CM

## 2020-11-06 MED ORDER — PREGABALIN 75 MG PO CAPS: 75.0000 mg | ORAL_CAPSULE | Freq: Two times a day (BID) | ORAL | 0 refills | Status: DC

## 2020-11-06 MED ORDER — CEPHALEXIN 500 MG PO CAPS: 500.0000 mg | ORAL_CAPSULE | Freq: Three times a day (TID) | ORAL | 0 refills | Status: DC

## 2020-11-06 NOTE — Progress Notes (Signed)
Post-Op Visit Note   [name]           Date of Birth: 10/18/1955           MRN: [id number] Visit Date: 11/06/2020 PCP: [name], MD  Chief Complaint:  Chief Complaint  Patient presents with   Left Knee - Routine Post Op    HPI:  HPI The patient is a 65 year old woman seen status post left above-knee amputation on July 14.  She is concerned about some drainage she is having from 1 area of dehiscence of her incision  Currently using gabapentin 800 mg 3 times daily Ortho Exam On examination of the left above-knee amputation this is approximated sutures and staples there is 1 area that has dehisced mildly this is a length of about 4 cm its open about 1 cm there is some fibrinous exudative tissue and some eschar in the wound bed this does not probe.  There is mild surrounding erythema no warmth no ascending cellulitis Visit Diagnoses: No diagnosis found.  Plan: We will go ahead and place her on some Keflex.  She also has voiced that her gabapentin is inadequate for her phantom pain we will trial her on some Lyrica  Follow-Up Instructions: No follow-ups on file.   Imaging: No results found.  Orders:  No orders of the defined types were placed in this encounter.  Meds ordered this encounter  Medications   cephALEXin (KEFLEX) 500 MG capsule    Sig: Take 1 capsule (500 mg total) by mouth 3 (three) times daily.    Dispense:  30 capsule    Refill:  0   pregabalin (LYRICA) 75 MG capsule    Sig: Take 1 capsule (75 mg total) by mouth 2 (two) times daily.    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)    Arthritis of left knee 09/20/2020   Septic arthritis of knee, left ([place]) 09/17/2020   Cellulitis and abscess of lower extremity 09/11/2020   Debility 05/02/2020   Urinary incontinence  05/02/2020   Pain in left shin    Urinary frequency    Sleep disturbance    Pain    Anxiety state    Urinary urgency    Drug induced constipation    Post-operative pain    Benign essential HTN    Pruritus    Left medial tibial plateau fracture 03/01/2020   Morbid obesity ([place])    Acute blood loss anemia    Postoperative pain    Tibial plateau fracture 02/25/2020   Tibial plateau fracture, left 02/25/2020   Hyponatremia 02/25/2020   Alcohol use disorder, moderate, dependence ([place]) 02/25/2020   Essential hypertension 02/25/2020   Past Medical History:  Diagnosis Date   Anxiety    Arthritis    Depression     No family history on file.  Past Surgical History:  Procedure Laterality Date   ABDOMINAL HYSTERECTOMY     AMPUTATION Left 10/18/2020   Procedure: LEFT ABOVE KNEE AMPUTATION;  Surgeon: Newt Minion, MD;  Location: [facility];  Service: Orthopedics;  Laterality: Left;   BREAST SURGERY     left lumpectomy    gastric bypass surgery      HARDWARE REMOVAL Left 08/28/2020   Procedure: HARDWARE REMOVAL;  Surgeon: Hiram Gash,  MD;  [facility];  Service: Orthopedics;  Laterality: Left;   hemorrhjoid surgery      IRRIGATION AND DEBRIDEMENT ABSCESS Left 09/11/2020   Procedure: Irrigation and debridement of deep abscess- left leg, septic joint;  Surgeon: Hiram Gash, MD;  [facility];  Service: Orthopedics;  Laterality: Left;   KNEE ARTHROSCOPY WITH MEDIAL MENISECTOMY Left 08/28/2020   Procedure: KNEE ARTHROSCOPY WITH CHONDROPLASTY, MANIPULATION WITH LYSIS OF ADHESIONS, POSSIBLE MEDIAL MENISECTOMY;  Surgeon: Hiram Gash, MD;  [facility];  Service: Orthopedics;  Laterality: Left;   left femur fracture surgery      x 2   ORIF TIBIA PLATEAU Left 02/26/2020   Procedure: OPEN REDUCTION INTERNAL FIXATION (ORIF) TIBIAL PLATEAU;  Surgeon: Shona Needles, MD;  Location: [facility];  Service: Orthopedics;  Laterality: Left;   TUBAL LIGATION     Social History   Occupational  History   Not on file  Tobacco Use   Smoking status: Every Day    Types: Cigars   Smokeless tobacco: Never  Vaping Use   Vaping Use: Former   Substances: Nicotine, Flavoring  Substance and Sexual Activity   Alcohol use: Yes    Alcohol/week: 18.0 standard drinks    Types: 18 Cans of beer per week    Comment: 18 beers per week   Drug use: Never   Sexual activity: Not on file

## 2020-11-07 ENCOUNTER — Encounter: Payer: BC Managed Care – PPO | Admitting: Orthopedic Surgery

## 2020-11-12 ENCOUNTER — Telehealth: Payer: Self-pay

## 2020-11-12 NOTE — Telephone Encounter (Signed)
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  Patient is currently taken Keflex.  Cb# [phone].  Please advise.  Thank you

## 2020-11-13 ENCOUNTER — Encounter: Payer: Self-pay | Admitting: Family

## 2020-11-13 ENCOUNTER — Ambulatory Visit (INDEPENDENT_AMBULATORY_CARE_PROVIDER_SITE_OTHER): Payer: BC Managed Care – PPO | Admitting: Family

## 2020-11-13 DIAGNOSIS — S78112A Complete traumatic amputation at level between left hip and knee, initial encounter: Secondary | ICD-10-CM

## 2020-11-13 DIAGNOSIS — Z89612 Acquired absence of left leg above knee: Secondary | ICD-10-CM

## 2020-11-13 NOTE — Progress Notes (Signed)
Post-Op Visit Note   [name]           Date of Birth: May 13, 1955           MRN: [id number] Visit Date: 11/13/2020 PCP: [name], MD  Chief Complaint:  Chief Complaint  Patient presents with   Left Leg - Routine Post Op    10/18/20 left AKA     HPI:  HPI The patient is a 65 year old woman seen status post left above-knee amputation on July 14.  Is residing at skilled nursing.  States she is discharging home from [place] care today.  There is some concern about an area of her incision that had dehisced.  She is currently taking Keflex.   Ortho Exam On examination of the left above-knee amputation majority of that is healing well sutures and staples are in place.  She does centrally have an area of dehiscence this is about 4 cm in width this was debrided of fibrinous exudative tissue there is about 1 mm of depth this is filled in with granulation tissue there is no probing no active drainage no surrounding erythema.  There is some dry flaking skin no induration no cellulitis  Visit Diagnoses: No diagnosis found.  Plan: Complete Keflex.  She will continue on Lyrica we will reevaluate whether this is effective for her phantom pain at next follow-up.  She will begin silver cell dressings to the area of dehiscence.  Sutures and staples harvested today.  Follow-Up Instructions: No follow-ups on file.   Imaging: No results found.  Orders:  No orders of the defined types were placed in this encounter.  No orders of the defined types were placed in this encounter.    PMFS History: Patient Active Problem List   Diagnosis Date Noted   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[place]) 11/06/2020   Knee osteomyelitis, left ([place]) 10/18/2020   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 ([facility])    Arthritis of left knee 09/20/2020   Septic arthritis of knee, left ([place]) 09/17/2020   Cellulitis and abscess of lower extremity 09/11/2020   Debility  05/02/2020   Urinary incontinence 05/02/2020   Pain in left shin    Urinary frequency    Sleep disturbance    Pain    Anxiety state    Urinary urgency    Drug induced constipation    Post-operative pain    Benign essential HTN    Pruritus    Left medial tibial plateau fracture 03/01/2020   Morbid obesity ([place])    Acute blood loss anemia    Postoperative pain    Tibial plateau fracture 02/25/2020   Tibial plateau fracture, left 02/25/2020   Hyponatremia 02/25/2020   Alcohol use disorder, moderate, dependence ([place]) 02/25/2020   Essential hypertension 02/25/2020   Past Medical History:  Diagnosis Date   Anxiety    Arthritis    Depression     History reviewed. No pertinent family history.  Past Surgical History:  Procedure Laterality Date   ABDOMINAL HYSTERECTOMY     AMPUTATION Left 10/18/2020   Procedure: LEFT ABOVE KNEE AMPUTATION;  Surgeon: Newt Minion, MD;  Location: [facility];  Service: Orthopedics;  Laterality: Left;   BREAST SURGERY     left lumpectomy    gastric bypass surgery      HARDWARE REMOVAL Left 08/28/2020   Procedure: HARDWARE REMOVAL;  Surgeon: Hiram Gash, MD;  Location: [facility];  Service: Orthopedics;  Laterality: Left;  hemorrhjoid surgery      IRRIGATION AND DEBRIDEMENT ABSCESS Left 09/11/2020   Procedure: Irrigation and debridement of deep abscess- left leg, septic joint;  Surgeon: Hiram Gash, MD;  Location: [facility];  Service: Orthopedics;  Laterality: Left;   KNEE ARTHROSCOPY WITH MEDIAL MENISECTOMY Left 08/28/2020   Procedure: KNEE ARTHROSCOPY WITH CHONDROPLASTY, MANIPULATION WITH LYSIS OF ADHESIONS, POSSIBLE MEDIAL MENISECTOMY;  Surgeon: Hiram Gash, MD;  Location[facility];  Service: Orthopedics;  Laterality: Left;   left femur fracture surgery      x 2   ORIF TIBIA PLATEAU Left 02/26/2020   Procedure: OPEN REDUCTION INTERNAL FIXATION (ORIF) TIBIAL PLATEAU;  Surgeon: Shona Needles, MD;  Location: [facility];  Service: Orthopedics;  Laterality: Left;    TUBAL LIGATION     Social History   Occupational History   Not on file  Tobacco Use   Smoking status: Every Day    Types: Cigars   Smokeless tobacco: Never  Vaping Use   Vaping Use: Former   Substances: Nicotine, Flavoring  Substance and Sexual Activity   Alcohol use: Yes    Alcohol/week: 18.0 standard drinks    Types: 18 Cans of beer per week    Comment: 18 beers per week   Drug use: Never   Sexual activity: Not on file

## 2020-11-13 NOTE — Telephone Encounter (Signed)
[name] wrote for orders to continue with ABX removed most of the staples and stitches, packed with Silvercell and sent orders back to facility. Anticipated d/c from facility tomorrow.

## 2020-11-13 NOTE — Telephone Encounter (Signed)
Will hold to address at appt today.

## 2020-11-22 ENCOUNTER — Encounter: Payer: Self-pay | Admitting: Family

## 2020-11-22 ENCOUNTER — Ambulatory Visit (INDEPENDENT_AMBULATORY_CARE_PROVIDER_SITE_OTHER): Payer: BC Managed Care – PPO | Admitting: Family

## 2020-11-22 DIAGNOSIS — T8750 Necrosis of amputation stump, unspecified extremity: Secondary | ICD-10-CM

## 2020-11-22 DIAGNOSIS — T8754 Necrosis of amputation stump, left lower extremity: Secondary | ICD-10-CM

## 2020-11-22 NOTE — Progress Notes (Signed)
Post-Op Visit Note   [name]           Date of Birth: 08/09/1955           MRN: [id number] Visit Date: 11/22/2020 PCP: [name], MD  Chief Complaint: No chief complaint on file.   HPI:  HPI The patient is a 65 year old woman seen today status post above-knee amputation.  She has been packing the wound open with silver cell unfortunately she did have some dehiscence medially  She has not had any fever or chills Ortho Exam On examination the incision is well-healed laterally medially there is an area that is about 4 cm in diameter that is dehisced this is deeper than last viewing it does probe about 3 cm deep there is serosanguineous drainage quite copious after probing.  There is resolving cellulitis surrounding the ulcer there is necrotic tissue in the wound bed  Visit Diagnoses: No diagnosis found.  Plan: She will continue dry dressing changes daily.  Plan for I&D versus revision left above-knee amputation next week.  Patient in agreement with the plan.  Follow-Up Instructions: No follow-ups on file.   Imaging: No results found.  Orders:  No orders of the defined types were placed in this encounter.  No orders of the defined types were placed in this encounter.    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 ([facility]CC)    Arthritis of left knee 09/20/2020   Septic arthritis of knee, left ([place]) 09/17/2020   Cellulitis and abscess of lower extremity 09/11/2020   Debility 05/02/2020   Urinary incontinence 05/02/2020   Pain in left shin    Urinary frequency    Sleep disturbance    Pain    Anxiety state    Urinary urgency    Drug induced constipation    Post-operative pain    Benign essential HTN    Pruritus    Left medial tibial plateau fracture 03/01/2020   Morbid  obesity ([place])    Acute blood loss anemia    Postoperative pain    Tibial plateau fracture 02/25/2020   Tibial plateau fracture, left 02/25/2020   Hyponatremia 02/25/2020   Alcohol use disorder, moderate, dependence ([place]) 02/25/2020   Essential hypertension 02/25/2020   Past Medical History:  Diagnosis Date   Anxiety    Arthritis    Depression     History reviewed. No pertinent family history.  Past Surgical History:  Procedure Laterality Date   ABDOMINAL HYSTERECTOMY     AMPUTATION Left 10/18/2020   Procedure: LEFT ABOVE KNEE AMPUTATION;  Surgeon: Newt Minion, MD;  Location: [facility];  Service: Orthopedics;  Laterality: Left;   BREAST SURGERY     left lumpectomy    gastric bypass surgery      HARDWARE REMOVAL Left 08/28/2020   Procedure: HARDWARE REMOVAL;  Surgeon: Hiram Gash, MD;  [facility];  Service: Orthopedics;  Laterality: Left;   hemorrhjoid surgery      IRRIGATION AND DEBRIDEMENT ABSCESS Left 09/11/2020   Procedure: Irrigation and debridement of deep abscess- left leg, septic joint;  Surgeon: Hiram Gash, MD;  [facility];  Service: Orthopedics;  Laterality: Left;   KNEE ARTHROSCOPY WITH MEDIAL MENISECTOMY Left 08/28/2020   Procedure: KNEE ARTHROSCOPY WITH CHONDROPLASTY, MANIPULATION WITH LYSIS OF ADHESIONS, POSSIBLE MEDIAL  MENISECTOMY;  Surgeon: Hiram Gash, MD;  [facility];  Service: Orthopedics;  Laterality: Left;   left femur fracture surgery      x 2   ORIF TIBIA PLATEAU Left 02/26/2020   Procedure: OPEN REDUCTION INTERNAL FIXATION (ORIF) TIBIAL PLATEAU;  Surgeon: Shona Needles, MD;  Location: [facility];  Service: Orthopedics;  Laterality: Left;   TUBAL LIGATION     Social History   Occupational History   Not on file  Tobacco Use   Smoking status: Every Day    Types: Cigars   Smokeless tobacco: Never  Vaping Use   Vaping Use: Former   Substances: Nicotine, Flavoring  Substance and Sexual Activity   Alcohol use: Yes    Alcohol/week: 18.0  standard drinks    Types: 18 Cans of beer per week    Comment: 18 beers per week   Drug use: Never   Sexual activity: Not on file

## 2020-11-27 ENCOUNTER — Other Ambulatory Visit: Payer: Self-pay | Admitting: Physician Assistant

## 2020-11-28 ENCOUNTER — Other Ambulatory Visit: Payer: Self-pay

## 2020-11-28 ENCOUNTER — Encounter (HOSPITAL_COMMUNITY): Payer: Self-pay | Admitting: Orthopedic Surgery

## 2020-11-28 NOTE — Progress Notes (Signed)
Patient voiced understanding of new arrival time of 0930. Voiced understanding of ERAS instructions: clear liquids okay until 0930.

## 2020-11-28 NOTE — Progress Notes (Signed)
Spoke with pt for pre-op call. Pt denies cardiac history and Diabetes. Pt is treated for HTN. Pt states she has not had any cigars since Aug. 28th by using the nicotine patch.  Pt will need Covid test done on arrival.

## 2020-11-29 ENCOUNTER — Encounter (HOSPITAL_COMMUNITY): Payer: Self-pay | Admitting: Orthopedic Surgery

## 2020-11-29 ENCOUNTER — Encounter (HOSPITAL_COMMUNITY)
Admission: AD | Disposition: A | Discharge status: Home Health | Payer: Self-pay | Source: Home / Self Care | Attending: Orthopedic Surgery

## 2020-11-29 ENCOUNTER — Inpatient Hospital Stay (HOSPITAL_COMMUNITY)
Admission: AD | Admit: 2020-11-29 | Discharge: 2020-12-02 | DRG: 476 | Disposition: A | Discharge status: Home Health | Payer: BC Managed Care – PPO | Attending: Orthopedic Surgery | Admitting: Orthopedic Surgery

## 2020-11-29 ENCOUNTER — Ambulatory Visit (HOSPITAL_COMMUNITY): Payer: BC Managed Care – PPO | Admitting: Anesthesiology

## 2020-11-29 DIAGNOSIS — M1990 Unspecified osteoarthritis, unspecified site: Secondary | ICD-10-CM | POA: Diagnosis present

## 2020-11-29 DIAGNOSIS — L8990 Pressure ulcer of unspecified site, unspecified stage: Secondary | ICD-10-CM | POA: Diagnosis present

## 2020-11-29 DIAGNOSIS — Z9884 Bariatric surgery status: Secondary | ICD-10-CM | POA: Diagnosis not present

## 2020-11-29 DIAGNOSIS — S78112A Complete traumatic amputation at level between left hip and knee, initial encounter: Principal | ICD-10-CM

## 2020-11-29 DIAGNOSIS — Z90710 Acquired absence of both cervix and uterus: Secondary | ICD-10-CM

## 2020-11-29 DIAGNOSIS — T8781 Dehiscence of amputation stump: Principal | ICD-10-CM | POA: Diagnosis present

## 2020-11-29 DIAGNOSIS — Z885 Allergy status to narcotic agent status: Secondary | ICD-10-CM

## 2020-11-29 DIAGNOSIS — Y838 Other surgical procedures as the cause of abnormal reaction of the patient, or of later complication, without mention of misadventure at the time of the procedure: Secondary | ICD-10-CM | POA: Diagnosis present

## 2020-11-29 DIAGNOSIS — M79605 Pain in left leg: Secondary | ICD-10-CM | POA: Diagnosis present

## 2020-11-29 DIAGNOSIS — Z20822 Contact with and (suspected) exposure to covid-19: Secondary | ICD-10-CM | POA: Diagnosis present

## 2020-11-29 DIAGNOSIS — Z87891 Personal history of nicotine dependence: Secondary | ICD-10-CM | POA: Diagnosis not present

## 2020-11-29 DIAGNOSIS — I10 Essential (primary) hypertension: Secondary | ICD-10-CM | POA: Diagnosis present

## 2020-11-29 DIAGNOSIS — T8130XA Disruption of wound, unspecified, initial encounter: Secondary | ICD-10-CM | POA: Diagnosis not present

## 2020-11-29 DIAGNOSIS — Z969 Presence of functional implant, unspecified: Secondary | ICD-10-CM

## 2020-11-29 HISTORY — PX: STUMP REVISION: SHX6102

## 2020-11-29 HISTORY — DX: Anemia, unspecified: D64.9

## 2020-11-29 HISTORY — DX: Bipolar disorder, unspecified: F31.9

## 2020-11-29 HISTORY — DX: Unspecified asthma, uncomplicated: J45.909

## 2020-11-29 HISTORY — DX: Essential (primary) hypertension: I10

## 2020-11-29 LAB — SARS CORONAVIRUS 2 BY RT PCR (HOSPITAL ORDER, PERFORMED IN ~~LOC~~ HOSPITAL LAB): SARS Coronavirus 2: NEGATIVE

## 2020-11-29 SURGERY — REVISION, AMPUTATION SITE
Anesthesia: General | Laterality: Left

## 2020-11-29 MED ORDER — 0.9 % SODIUM CHLORIDE (POUR BTL) OPTIME
TOPICAL | Status: DC | PRN
  Administered 2020-11-29 (×2): 1000 mL

## 2020-11-29 MED ORDER — HYDROMORPHONE HCL 1 MG/ML IJ SOLN
INTRAMUSCULAR | Status: DC | PRN
  Administered 2020-11-29: 1 mg via INTRAVENOUS

## 2020-11-29 MED ORDER — AMISULPRIDE (ANTIEMETIC) 5 MG/2ML IV SOLN: 10.0000 mg | Freq: Once | INTRAVENOUS | Status: DC | PRN

## 2020-11-29 MED ORDER — BISACODYL 5 MG PO TBEC: 5.0000 mg | DELAYED_RELEASE_TABLET | Freq: Every day | ORAL | Status: DC | PRN

## 2020-11-29 MED ORDER — VITAMIN D 25 MCG (1000 UNIT) PO TABS
ORAL_TABLET | Freq: Every morning | ORAL | Status: DC
  Administered 2020-11-30 – 2020-12-02 (×3): 1000 [IU] via ORAL
  Filled 2020-11-29 (×3): qty 1

## 2020-11-29 MED ORDER — MIRABEGRON ER 25 MG PO TB24
25.0000 mg | ORAL_TABLET | Freq: Every morning | ORAL | Status: DC
  Administered 2020-11-30 – 2020-12-02 (×3): 25 mg via ORAL
  Filled 2020-11-29 (×3): qty 1

## 2020-11-29 MED ORDER — OXYCODONE HCL 5 MG PO TABS
5.0000 mg | ORAL_TABLET | ORAL | Status: DC | PRN
  Administered 2020-12-01: 10 mg via ORAL
  Filled 2020-11-29 (×2): qty 2
  Filled 2020-11-29: qty 1

## 2020-11-29 MED ORDER — TRANEXAMIC ACID-NACL 1000-0.7 MG/100ML-% IV SOLN
1000.0000 mg | Freq: Once | INTRAVENOUS | Status: AC
  Administered 2020-11-29: 1000 mg via INTRAVENOUS
  Filled 2020-11-29: qty 100

## 2020-11-29 MED ORDER — ORAL CARE MOUTH RINSE: 15.0000 mL | Freq: Once | OROMUCOSAL | Status: AC

## 2020-11-29 MED ORDER — KETAMINE HCL 10 MG/ML IJ SOLN
INTRAMUSCULAR | Status: DC | PRN
  Administered 2020-11-29: 50 mg via INTRAVENOUS
  Administered 2020-11-29: 30 mg via INTRAVENOUS
  Administered 2020-11-29: 20 mg via INTRAVENOUS

## 2020-11-29 MED ORDER — ARIPIPRAZOLE 10 MG PO TABS
5.0000 mg | ORAL_TABLET | Freq: Every day | ORAL | Status: DC
  Administered 2020-11-30 – 2020-12-02 (×3): 5 mg via ORAL
  Filled 2020-11-29 (×3): qty 1

## 2020-11-29 MED ORDER — ALUM & MAG HYDROXIDE-SIMETH 200-200-20 MG/5ML PO SUSP: 15.0000 mL | ORAL | Status: DC | PRN

## 2020-11-29 MED ORDER — PHENYLEPHRINE HCL (PRESSORS) 10 MG/ML IV SOLN
INTRAVENOUS | Status: DC | PRN
  Administered 2020-11-29: 80 ug via INTRAVENOUS
  Administered 2020-11-29 (×2): 160 ug via INTRAVENOUS

## 2020-11-29 MED ORDER — MEPERIDINE HCL 25 MG/ML IJ SOLN: 6.2500 mg | INTRAMUSCULAR | Status: DC | PRN

## 2020-11-29 MED ORDER — GUAIFENESIN-DM 100-10 MG/5ML PO SYRP: 15.0000 mL | ORAL_SOLUTION | ORAL | Status: DC | PRN

## 2020-11-29 MED ORDER — POLYETHYLENE GLYCOL 3350 17 G PO PACK
17.0000 g | PACK | Freq: Every day | ORAL | Status: DC | PRN
  Filled 2020-11-29: qty 1

## 2020-11-29 MED ORDER — FENTANYL CITRATE (PF) 250 MCG/5ML IJ SOLN
INTRAMUSCULAR | Status: AC
  Filled 2020-11-29: qty 5

## 2020-11-29 MED ORDER — OXYCODONE HCL 5 MG PO TABS
10.0000 mg | ORAL_TABLET | ORAL | Status: DC | PRN
Start: 2020-11-29 — End: 2020-12-02
  Administered 2020-11-29: 10 mg via ORAL
  Administered 2020-11-30 – 2020-12-02 (×10): 15 mg via ORAL
  Filled 2020-11-29 (×10): qty 3

## 2020-11-29 MED ORDER — ACETAMINOPHEN 500 MG PO TABS: 1000.0000 mg | ORAL_TABLET | Freq: Once | ORAL | Status: DC

## 2020-11-29 MED ORDER — LIDOCAINE 2% (20 MG/ML) 5 ML SYRINGE
INTRAMUSCULAR | Status: DC | PRN
Start: 2020-11-29 — End: 2020-11-29
  Administered 2020-11-29: 60 mg via INTRAVENOUS

## 2020-11-29 MED ORDER — PREGABALIN 75 MG PO CAPS
150.0000 mg | ORAL_CAPSULE | Freq: Two times a day (BID) | ORAL | Status: DC
  Administered 2020-11-29 – 2020-12-02 (×6): 150 mg via ORAL
  Filled 2020-11-29 (×6): qty 2

## 2020-11-29 MED ORDER — JUVEN PO PACK
1.0000 | PACK | Freq: Two times a day (BID) | ORAL | Status: DC
  Filled 2020-11-29 (×2): qty 1

## 2020-11-29 MED ORDER — HYDROMORPHONE HCL 1 MG/ML IJ SOLN
INTRAMUSCULAR | Status: AC
  Filled 2020-11-29: qty 1

## 2020-11-29 MED ORDER — PROPOFOL 10 MG/ML IV BOLUS
INTRAVENOUS | Status: DC | PRN
  Administered 2020-11-29: 200 mg via INTRAVENOUS

## 2020-11-29 MED ORDER — FUROSEMIDE 20 MG PO TABS: 20.0000 mg | ORAL_TABLET | Freq: Every day | ORAL | Status: DC | PRN

## 2020-11-29 MED ORDER — PROMETHAZINE HCL 25 MG/ML IJ SOLN: 6.2500 mg | INTRAMUSCULAR | Status: DC | PRN

## 2020-11-29 MED ORDER — DIPHENHYDRAMINE HCL 25 MG PO CAPS
25.0000 mg | ORAL_CAPSULE | Freq: Four times a day (QID) | ORAL | Status: DC | PRN
  Administered 2020-12-02: 25 mg via ORAL
  Filled 2020-11-29: qty 1

## 2020-11-29 MED ORDER — CYCLOBENZAPRINE HCL 10 MG PO TABS
10.0000 mg | ORAL_TABLET | Freq: Three times a day (TID) | ORAL | Status: DC | PRN
  Administered 2020-11-29 – 2020-12-01 (×4): 10 mg via ORAL
  Filled 2020-11-29 (×4): qty 1

## 2020-11-29 MED ORDER — ACETAMINOPHEN 325 MG PO TABS
325.0000 mg | ORAL_TABLET | Freq: Four times a day (QID) | ORAL | Status: DC | PRN
  Administered 2020-12-02: 650 mg via ORAL
  Filled 2020-11-29: qty 2

## 2020-11-29 MED ORDER — HYDROMORPHONE HCL 1 MG/ML IJ SOLN
INTRAMUSCULAR | Status: AC
  Administered 2020-11-29: 1 mg
  Filled 2020-11-29: qty 1

## 2020-11-29 MED ORDER — POTASSIUM CHLORIDE 20 MEQ PO PACK
20.0000 meq | PACK | Freq: Every morning | ORAL | Status: DC
  Filled 2020-11-29: qty 1

## 2020-11-29 MED ORDER — HYDROMORPHONE HCL 1 MG/ML IJ SOLN
0.5000 mg | INTRAMUSCULAR | Status: DC | PRN
  Administered 2020-11-29 – 2020-11-30 (×3): 1 mg via INTRAVENOUS
  Filled 2020-11-29 (×4): qty 1

## 2020-11-29 MED ORDER — CEFAZOLIN IN SODIUM CHLORIDE 3-0.9 GM/100ML-% IV SOLN
3.0000 g | INTRAVENOUS | Status: AC
  Administered 2020-11-29: 3 g via INTRAVENOUS
  Filled 2020-11-29: qty 100

## 2020-11-29 MED ORDER — OXYCODONE HCL 5 MG PO TABS
5.0000 mg | ORAL_TABLET | Freq: Once | ORAL | Status: DC | PRN
Start: 2020-11-29 — End: 2020-11-29

## 2020-11-29 MED ORDER — ASCORBIC ACID 500 MG PO TABS
1000.0000 mg | ORAL_TABLET | Freq: Every day | ORAL | Status: DC
  Administered 2020-11-29 – 2020-12-02 (×4): 1000 mg via ORAL
  Filled 2020-11-29 (×4): qty 2

## 2020-11-29 MED ORDER — LACTATED RINGERS IV SOLN: INTRAVENOUS | Status: DC

## 2020-11-29 MED ORDER — SUCCINYLCHOLINE CHLORIDE 200 MG/10ML IV SOSY
PREFILLED_SYRINGE | INTRAVENOUS | Status: AC
  Filled 2020-11-29: qty 10

## 2020-11-29 MED ORDER — LIDOCAINE 2% (20 MG/ML) 5 ML SYRINGE
INTRAMUSCULAR | Status: AC
  Filled 2020-11-29: qty 10

## 2020-11-29 MED ORDER — ONDANSETRON HCL 4 MG/2ML IJ SOLN
INTRAMUSCULAR | Status: DC | PRN
  Administered 2020-11-29: 4 mg via INTRAVENOUS

## 2020-11-29 MED ORDER — PANTOPRAZOLE SODIUM 40 MG PO TBEC
40.0000 mg | DELAYED_RELEASE_TABLET | Freq: Every day | ORAL | Status: DC
  Administered 2020-11-29 – 2020-12-02 (×4): 40 mg via ORAL
  Filled 2020-11-29 (×4): qty 1

## 2020-11-29 MED ORDER — SUCCINYLCHOLINE CHLORIDE 200 MG/10ML IV SOSY
PREFILLED_SYRINGE | INTRAVENOUS | Status: AC
  Filled 2020-11-29: qty 20

## 2020-11-29 MED ORDER — ZINC SULFATE 220 (50 ZN) MG PO CAPS
220.0000 mg | ORAL_CAPSULE | Freq: Every day | ORAL | Status: DC
Start: 2020-11-29 — End: 2020-12-02
  Administered 2020-11-29 – 2020-12-02 (×4): 220 mg via ORAL
  Filled 2020-11-29 (×4): qty 1

## 2020-11-29 MED ORDER — MAGNESIUM CITRATE PO SOLN: 1.0000 | Freq: Once | ORAL | Status: DC | PRN

## 2020-11-29 MED ORDER — FAMOTIDINE 20 MG PO TABS: 20.0000 mg | ORAL_TABLET | Freq: Every day | ORAL | Status: DC | PRN

## 2020-11-29 MED ORDER — DOCUSATE SODIUM 100 MG PO CAPS
100.0000 mg | ORAL_CAPSULE | Freq: Every day | ORAL | Status: DC
  Administered 2020-11-30 – 2020-12-01 (×2): 100 mg via ORAL
  Filled 2020-11-29 (×2): qty 1

## 2020-11-29 MED ORDER — CEFAZOLIN SODIUM-DEXTROSE 2-4 GM/100ML-% IV SOLN
2.0000 g | Freq: Three times a day (TID) | INTRAVENOUS | Status: AC
  Administered 2020-11-29 – 2020-11-30 (×2): 2 g via INTRAVENOUS
  Filled 2020-11-29 (×3): qty 100

## 2020-11-29 MED ORDER — NICOTINE 21 MG/24HR TD PT24
21.0000 mg | MEDICATED_PATCH | Freq: Every morning | TRANSDERMAL | Status: DC
  Administered 2020-11-30 – 2020-12-02 (×3): 21 mg via TRANSDERMAL
  Filled 2020-11-29 (×3): qty 1

## 2020-11-29 MED ORDER — ALPRAZOLAM 0.5 MG PO TABS
0.5000 mg | ORAL_TABLET | Freq: Three times a day (TID) | ORAL | Status: DC | PRN
  Administered 2020-12-01: 0.5 mg via ORAL
  Filled 2020-11-29: qty 1

## 2020-11-29 MED ORDER — KETAMINE HCL 50 MG/5ML IJ SOSY
PREFILLED_SYRINGE | INTRAMUSCULAR | Status: AC
  Filled 2020-11-29: qty 5

## 2020-11-29 MED ORDER — AMLODIPINE BESYLATE 5 MG PO TABS
5.0000 mg | ORAL_TABLET | Freq: Every morning | ORAL | Status: DC
  Administered 2020-11-30 – 2020-12-02 (×3): 5 mg via ORAL
  Filled 2020-11-29 (×3): qty 1

## 2020-11-29 MED ORDER — ONDANSETRON HCL 4 MG/2ML IJ SOLN
INTRAMUSCULAR | Status: AC
  Filled 2020-11-29: qty 4

## 2020-11-29 MED ORDER — PROPOFOL 10 MG/ML IV BOLUS
INTRAVENOUS | Status: AC
  Filled 2020-11-29: qty 20

## 2020-11-29 MED ORDER — OXYCODONE HCL 5 MG/5ML PO SOLN: 5.0000 mg | Freq: Once | ORAL | Status: DC | PRN

## 2020-11-29 MED ORDER — DOCUSATE SODIUM 100 MG PO CAPS: 100.0000 mg | ORAL_CAPSULE | Freq: Every day | ORAL | Status: DC | PRN

## 2020-11-29 MED ORDER — CHLORHEXIDINE GLUCONATE 0.12 % MT SOLN
15.0000 mL | Freq: Once | OROMUCOSAL | Status: AC
  Administered 2020-11-29: 15 mL via OROMUCOSAL
  Filled 2020-11-29: qty 15

## 2020-11-29 MED ORDER — ONDANSETRON HCL 4 MG/2ML IJ SOLN: 4.0000 mg | Freq: Four times a day (QID) | INTRAMUSCULAR | Status: DC | PRN

## 2020-11-29 MED ORDER — FENTANYL CITRATE (PF) 100 MCG/2ML IJ SOLN
INTRAMUSCULAR | Status: DC | PRN
  Administered 2020-11-29 (×2): 100 ug via INTRAVENOUS
  Administered 2020-11-29: 50 ug via INTRAVENOUS
  Administered 2020-11-29: 100 ug via INTRAVENOUS
  Administered 2020-11-29: 50 ug via INTRAVENOUS

## 2020-11-29 MED ORDER — SUCCINYLCHOLINE CHLORIDE 200 MG/10ML IV SOSY
PREFILLED_SYRINGE | INTRAVENOUS | Status: DC | PRN
  Administered 2020-11-29: 200 mg via INTRAVENOUS

## 2020-11-29 MED ORDER — PHENOL 1.4 % MT LIQD: 1.0000 | OROMUCOSAL | Status: DC | PRN

## 2020-11-29 MED ORDER — HYDROMORPHONE HCL 1 MG/ML IJ SOLN
0.2500 mg | INTRAMUSCULAR | Status: DC | PRN
  Administered 2020-11-29 (×4): 0.5 mg via INTRAVENOUS

## 2020-11-29 MED ORDER — SODIUM CHLORIDE 0.9 % IV SOLN: INTRAVENOUS | Status: DC

## 2020-11-29 MED ORDER — GLYCOPYRROLATE PF 0.2 MG/ML IJ SOSY
PREFILLED_SYRINGE | INTRAMUSCULAR | Status: DC | PRN
  Administered 2020-11-29: .2 mg via INTRAVENOUS

## 2020-11-29 SURGICAL SUPPLY — 33 items
BAG COUNTER SPONGE SURGICOUNT (BAG) ×2 IMPLANT
BAG SURGICOUNT SPONGE COUNTING (BAG) ×1
BLADE SAW RECIP 87.9 MT (BLADE) IMPLANT
BLADE SURG 21 STRL SS (BLADE) ×3 IMPLANT
CANISTER WOUND CARE 500ML ATS (WOUND CARE) ×3 IMPLANT
COVER SURGICAL LIGHT HANDLE (MISCELLANEOUS) ×3 IMPLANT
DRAPE DERMATAC (DRAPES) ×9 IMPLANT
DRAPE EXTREMITY T 121X128X90 (DISPOSABLE) ×3 IMPLANT
DRAPE HALF SHEET 40X57 (DRAPES) ×3 IMPLANT
DRAPE INCISE IOBAN 66X45 STRL (DRAPES) ×3 IMPLANT
DRAPE U-SHAPE 47X51 STRL (DRAPES) ×6 IMPLANT
DRESSING PREVENA PLUS CUSTOM (GAUZE/BANDAGES/DRESSINGS) ×1 IMPLANT
DRSG PREVENA PLUS CUSTOM (GAUZE/BANDAGES/DRESSINGS) ×3
DURAPREP 26ML APPLICATOR (WOUND CARE) ×3 IMPLANT
ELECT REM PT RETURN 9FT ADLT (ELECTROSURGICAL) ×3
ELECTRODE REM PT RTRN 9FT ADLT (ELECTROSURGICAL) ×1 IMPLANT
GLOVE SURG ORTHO LTX SZ9 (GLOVE) ×3 IMPLANT
GLOVE SURG UNDER POLY LF SZ9 (GLOVE) ×3 IMPLANT
GOWN STRL REUS W/ TWL XL LVL3 (GOWN DISPOSABLE) ×2 IMPLANT
GOWN STRL REUS W/TWL XL LVL3 (GOWN DISPOSABLE) ×6
KIT BASIN OR (CUSTOM PROCEDURE TRAY) ×3 IMPLANT
KIT TURNOVER KIT B (KITS) ×3 IMPLANT
MANIFOLD NEPTUNE II (INSTRUMENTS) ×3 IMPLANT
NS IRRIG 1000ML POUR BTL (IV SOLUTION) ×3 IMPLANT
PACK GENERAL/GYN (CUSTOM PROCEDURE TRAY) ×3 IMPLANT
PAD ARMBOARD 7.5X6 YLW CONV (MISCELLANEOUS) ×3 IMPLANT
PREVENA RESTOR ARTHOFORM 46X30 (CANNISTER) ×6 IMPLANT
RASP HELIOCORDIAL MED (MISCELLANEOUS) ×3 IMPLANT
STAPLER VISISTAT 35W (STAPLE) IMPLANT
SUT ETHILON 2 0 PSLX (SUTURE) ×18 IMPLANT
SUT SILK 2 0 (SUTURE) ×3
SUT SILK 2-0 18XBRD TIE 12 (SUTURE) ×1 IMPLANT
TOWEL GREEN STERILE (TOWEL DISPOSABLE) ×3 IMPLANT

## 2020-11-29 NOTE — Transfer of Care (Signed)
Immediate Anesthesia Transfer of Care Note  [name]  Procedure(s) Performed: REVISION LEFT ABOVE KNEE AMPUTATION with removal of deep hardware (Left)  Patient Location: PACU  Anesthesia Type:General  Level of Consciousness: drowsy and responds to stimulation  Airway & Oxygen Therapy: Patient Spontanous Breathing and Patient connected to face mask oxygen  Post-op Assessment: Report given to RN and Post -op Vital signs reviewed and stable  Post vital signs: Reviewed and stable  Last Vitals:  Vitals Value Taken Time  BP 145/80 11/29/20 1316  Temp    Pulse 107 11/29/20 1317  Resp 15 11/29/20 1317  SpO2 95 % 11/29/20 1317  Vitals shown include unvalidated device data.  Last Pain:  Vitals:   11/29/20 1020  PainSc: 3       Patients Stated Pain Goal: 3 ([id number])  Complications: No notable events documented.

## 2020-11-29 NOTE — Op Note (Signed)
11/29/2020  6:18 PM  PATIENT:  [name]    PRE-OPERATIVE DIAGNOSIS:  Necrosis Left Above Knee Amputation  POST-OPERATIVE DIAGNOSIS:  Same  PROCEDURE:  REVISION LEFT ABOVE KNEE AMPUTATION with removal of deep hardware  SURGEON:  Newt Minion, MD  PHYSICIAN ASSISTANT:None ANESTHESIA:   General  PREOPERATIVE INDICATIONS:  [name] is a  65 y.o. female with a diagnosis of Necrosis Left Above Knee Amputation who failed conservative measures and elected for surgical management.    The risks benefits and alternatives were discussed with the patient preoperatively including but not limited to the risks of infection, bleeding, nerve injury, cardiopulmonary complications, the need for revision surgery, among others, and the patient was willing to proceed.  OPERATIVE IMPLANTS: Praveena customizable and Arthur form wound VAC  '@ENCIMAGES'$ @  OPERATIVE FINDINGS: Tissue margins viable and healthy.  OPERATIVE PROCEDURE: Patient was brought the operating room and underwent a general anesthetic.  After adequate levels anesthesia were obtained patient's left lower extremity was prepped using DuraPrep draped into a sterile field a timeout was called.  The wound dehiscence necrotic tissue of the above-knee amputation was resected and a fishmouth incision around the ulcerative tissue.  This was carried sharply down to bone the distal 3 cm of bone was resected.  This required further removal of the deep retained hardware which was a femoral nail.  The distal interlocking screw was removed using a bur the nail was cut at the level of amputation approximately 3 cm of bone and femoral nail were removed.  The wound edges were healthy the wound was irrigated with normal saline.  Deep and superficial fascial layers were closed using 2-0 nylon.  A Praveena customizable and Arnell Sieving form wound VAC was applied this had a good suction fit patient was taken the PACU in stable condition.   DISCHARGE  PLANNING:  Antibiotic duration: Continue antibiotics for 23 hours  Weightbearing: Nonweightbearing on the left  Pain medication: Opioid pathway  Dressing care/ Wound VAC: Wound VAC  Ambulatory devices: Walker  Discharge to: Discharge planning based on therapy recommendations.  Follow-up: In the office 1 week post operative.

## 2020-11-29 NOTE — Anesthesia Preprocedure Evaluation (Addendum)
Anesthesia Evaluation  Patient identified by MRN, date of birth, ID band Patient awake    Reviewed: Allergy & Precautions, NPO status , Patient's Chart, lab work & pertinent test results  Airway Mallampati: III  TM Distance: >3 FB Neck ROM: Full  Mouth opening: Limited Mouth Opening  Dental  (+) Dental Advisory Given, Teeth Intact   Pulmonary asthma (well controlled) , former smoker,  Quit smoking 2022   Pulmonary exam normal breath sounds clear to auscultation       Cardiovascular hypertension, Pt. on medications Normal cardiovascular exam Rhythm:Regular Rate:Normal     Neuro/Psych PSYCHIATRIC DISORDERS Anxiety Depression Bipolar Disorder negative neurological ROS     GI/Hepatic Neg liver ROS, GERD  Medicated and Controlled,  Endo/Other  Morbid obesityBMI 50  Renal/GU negative Renal ROS  negative genitourinary   Musculoskeletal  (+) Arthritis , Osteoarthritis,  L AKA   Abdominal Normal abdominal exam  (+) + obese,   Peds  Hematology   Anesthesia Other Findings Chronic pain- oxy '10mg'$  five times/d since June  Reproductive/Obstetrics negative OB ROS                           Anesthesia Physical Anesthesia Plan  ASA: 3  Anesthesia Plan: General   Post-op Pain Management:    Induction: Intravenous  PONV Risk Score and Plan: Ondansetron, Dexamethasone, Midazolam and Treatment may vary due to age or medical condition  Airway Management Planned: Oral ETT  Additional Equipment: None  Intra-op Plan:   Post-operative Plan: Extubation in OR  Informed Consent: I have reviewed the patients History and Physical, chart, labs and discussed the procedure including the risks, benefits and alternatives for the proposed anesthesia with the patient or authorized representative who has indicated his/her understanding and acceptance.     Dental advisory given  Plan Discussed with:  CRNA  Anesthesia Plan Comments:         Anesthesia Quick Evaluation

## 2020-11-29 NOTE — Interval H&P Note (Signed)
History and Physical Interval Note:  11/29/2020 10:51 AM  [name]  has presented today for surgery, with the diagnosis of Necrosis Left Above Knee Amputation.  The various methods of treatment have been discussed with the patient and family. After consideration of risks, benefits and other options for treatment, the patient has consented to  Procedure(s): IRRIGATION AND DEBRIDEMENT VS. REVISION LEFT ABOVE KNEE AMPUTATION (Left) as a surgical intervention.  The patient's history has been reviewed, patient examined, no change in status, stable for surgery.  I have reviewed the patient's chart and labs.  Questions were answered to the patient's satisfaction.     Newt Minion

## 2020-11-29 NOTE — Anesthesia Postprocedure Evaluation (Signed)
Anesthesia Post Note  [name]  Procedure(s) Performed: REVISION LEFT ABOVE KNEE AMPUTATION with removal of deep hardware (Left)     Patient location during evaluation: PACU Anesthesia Type: General Level of consciousness: awake and alert, oriented and patient cooperative Pain management: pain level controlled Vital Signs Assessment: post-procedure vital signs reviewed and stable Respiratory status: spontaneous breathing, nonlabored ventilation and respiratory function stable Cardiovascular status: blood pressure returned to baseline and stable Postop Assessment: no apparent nausea or vomiting Anesthetic complications: no   No notable events documented.  Last Vitals:  Vitals:   11/29/20 1530 11/29/20 1558  BP: 107/87 114/63  Pulse: 88 93  Resp: (!) 23 16  Temp:  36.5 C  SpO2: 99% 100%    Last Pain:  Vitals:   11/29/20 1515  PainSc: 10-Worst pain ever                 Pervis Hocking

## 2020-11-29 NOTE — Anesthesia Procedure Notes (Signed)
Procedure Name: Intubation Date/Time: 11/29/2020 12:22 PM Performed by: Cathren Harsh, CRNA Pre-anesthesia Checklist: Patient identified, Emergency Drugs available, Suction available and Patient being monitored Patient Re-evaluated:Patient Re-evaluated prior to induction Oxygen Delivery Method: Circle System Utilized Preoxygenation: Pre-oxygenation with 100% oxygen Induction Type: IV induction Laryngoscope Size: Glidescope and 3 Grade View: Grade I Tube type: Oral Tube size: 7.0 mm Number of attempts: 1 Airway Equipment and Method: Stylet and Oral airway Placement Confirmation: ETT inserted through vocal cords under direct vision, positive ETCO2 and breath sounds checked- equal and bilateral Secured at: 21 cm Tube secured with: Tape Dental Injury: Teeth and Oropharynx as per pre-operative assessment  Difficulty Due To: Difficulty was anticipated Comments: Glidescope selection due to prior H/O glidescope use and pt overall presentation.

## 2020-11-29 NOTE — H&P (Signed)
[name] is an 65 y.o. female.   Chief Complaint: Wound dehiscence Left leg HPI: The patient is a 65 year old woman seen today status post above-knee amputation.  She has been packing the wound open with silver cell unfortunately she did have some dehiscence medially   She has not had any fever or chills  Past Medical History:  Diagnosis Date   Anemia    Anxiety    Arthritis    Asthma    no longer treated   Bipolar disorder ([place])    off meds for 4-5 years   Depression    Hypertension     Past Surgical History:  Procedure Laterality Date   ABDOMINAL HYSTERECTOMY     AMPUTATION Left 10/18/2020   Procedure: LEFT ABOVE KNEE AMPUTATION;  Surgeon: Newt Minion, MD;  Location: [facility];  Service: Orthopedics;  Laterality: Left;   BREAST SURGERY     left lumpectomy    gastric bypass surgery      HARDWARE REMOVAL Left 08/28/2020   Procedure: HARDWARE REMOVAL;  Surgeon: Hiram Gash, MD;  [facility];  Service: Orthopedics;  Laterality: Left;   hemorrhjoid surgery      IRRIGATION AND DEBRIDEMENT ABSCESS Left 09/11/2020   Procedure: Irrigation and debridement of deep abscess- left leg, septic joint;  Surgeon: Hiram Gash, MD;  [facility];  Service: Orthopedics;  Laterality: Left;   KNEE ARTHROSCOPY WITH MEDIAL MENISECTOMY Left 08/28/2020   Procedure: KNEE ARTHROSCOPY WITH CHONDROPLASTY, MANIPULATION WITH LYSIS OF ADHESIONS, POSSIBLE MEDIAL MENISECTOMY;  Surgeon: Hiram Gash, MD;  [facility];  Service: Orthopedics;  Laterality: Left;   left femur fracture surgery      x 2   ORIF TIBIA PLATEAU Left 02/26/2020   Procedure: OPEN REDUCTION INTERNAL FIXATION (ORIF) TIBIAL PLATEAU;  Surgeon: Shona Needles, MD;  Location: [facility];  Service: Orthopedics;  Laterality: Left;   TUBAL LIGATION      History reviewed. No pertinent family history. Social History:  reports that she quit smoking about 2 weeks ago. Her smoking use included cigars. She has never used smokeless tobacco.  She reports current alcohol use of about 18.0 standard drinks per week. She reports that she does not use drugs.  Allergies:  Allergies  Allergen Reactions   Morphine And Related Itching    Tolerates fine with antihistamine (benadryl or cetirizine)    Oxycodone Itching    Tolerates fine with antihistamine (benadryl or cetirizine)     No medications prior to admission.    No results found for this or any previous visit (from the past 48 hour(s)). No results found.  Review of Systems  All other systems reviewed and are negative.  There were no vitals taken for this visit. Physical Exam  On examination the incision is well-healed laterally medially there is an area that is about 4 cm in diameter that is dehisced this is deeper than last viewing it does probe about 3 cm deep there is serosanguineous drainage quite copious after probing.  There is resolving cellulitis surrounding the ulcer there is necrotic tissue in the wound bedHeart RRR Lungs Clear Assessment/Plan  Plan: She will continue dry dressing changes daily.  Plan for I&D versus revision left above-knee amputation next week.  Patient in agreement with the plan.   [name] Rachel Mirza, PA 11/29/2020, 5:58 AM

## 2020-11-29 NOTE — Progress Notes (Signed)
Orthopedic Tech Progress Note Patient Details:  [name] [birth year] [id number] AK shrinker has been ordered from [facility]  Patient ID: [name], female   DOB: [birth year], 65 y.o.   MRN: [id number]  Jearld Lesch 11/29/2020, 4:52 PM

## 2020-11-30 ENCOUNTER — Encounter (HOSPITAL_COMMUNITY): Payer: Self-pay | Admitting: Orthopedic Surgery

## 2020-11-30 DIAGNOSIS — L8990 Pressure ulcer of unspecified site, unspecified stage: Secondary | ICD-10-CM | POA: Insufficient documentation

## 2020-11-30 LAB — CBC
HCT: 28.4 % — ABNORMAL LOW (ref 36.0–46.0)
Hemoglobin: 8.7 g/dL — ABNORMAL LOW (ref 12.0–15.0)
MCH: 26.5 pg (ref 26.0–34.0)
MCHC: 30.6 g/dL (ref 30.0–36.0)
MCV: 86.6 fL (ref 80.0–100.0)
Platelets: 341 10*3/uL (ref 150–400)
RBC: 3.28 MIL/uL — ABNORMAL LOW (ref 3.87–5.11)
RDW: 16.1 % — ABNORMAL HIGH (ref 11.5–15.5)
WBC: 8.3 10*3/uL (ref 4.0–10.5)
nRBC: 0 % (ref 0.0–0.2)

## 2020-11-30 LAB — BASIC METABOLIC PANEL
Anion gap: 6 (ref 5–15)
BUN: 5 mg/dL — ABNORMAL LOW (ref 8–23)
CO2: 28 mmol/L (ref 22–32)
Calcium: 8.4 mg/dL — ABNORMAL LOW (ref 8.9–10.3)
Chloride: 103 mmol/L (ref 98–111)
Creatinine, Ser: 0.44 mg/dL (ref 0.44–1.00)
GFR, Estimated: >60 mL/min (ref >60)
Glucose, Bld: 110 mg/dL — ABNORMAL HIGH (ref 70–99)
Potassium: 4.3 mmol/L (ref 3.5–5.1)
Sodium: 137 mmol/L (ref 135–145)

## 2020-11-30 MED ORDER — POTASSIUM CHLORIDE CRYS ER 20 MEQ PO TBCR
20.0000 meq | EXTENDED_RELEASE_TABLET | Freq: Every day | ORAL | Status: DC
  Administered 2020-11-30 – 2020-12-02 (×3): 20 meq via ORAL
  Filled 2020-11-30 (×3): qty 1

## 2020-11-30 NOTE — Progress Notes (Signed)
Patient ID: [name], female   DOB: 1955/08/06, 65 y.o.   MRN: [id number] Patient is postoperative day 1 revision left above-the-knee amputation.  The intramedullary nail distal segment was also removed.  There is no drainage in the wound VAC canister patient has no complaints at this time.  Anticipate discharge to home once patient is safe with therapy.

## 2020-11-30 NOTE — Progress Notes (Signed)
Inpatient Rehab Admissions Coordinator:  Consult received. Note PT/OT are recommending [facility] therapy after acute hospital discharge. AC will sign off.   Gayland Curry, [place], [place] Admissions Coordinator [phone]

## 2020-11-30 NOTE — Progress Notes (Addendum)
Occupational Therapy Evaluation Patient Details Name: [name] MRN: [id number] DOB: 1955-09-15 Today's Date: 11/30/2020    History of Present Illness The pt is a 65 yo female who presented 8/12 for revision of L AKA adn removeal of deep hardware. 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 PMH includes L femur IMN, HTN, obesity, bipolar disorder, ETOH use.   Clinical Impression   [name] was evaluated s/p the above L AKA revision. PTA pt was mod I with BADLs since d/c from SNF to her home. She has a ramped entrance, 1 level home and lives with her husband who is currently in the hospital (cardiac floor, maybe d/c tomorrow?). Pt reports she was "living" in her recliner and completing stand-pivot transfers to the [facility] and wc when necessary. Upon eval, pt was min guard for sit<>stand and stand pivot with RW given min A vc for hand placement and safety. Pt is currently set up for all upper ADLs while sitting and mod-max A for lower body ADLs due to BUE reliant on RW in standing. Pt benefit from OT acutely. Recommend d/c home with supervision for all ADLs and mobility initially.     Follow Up Recommendations  Other (comment);Follow surgeon's recommendation for DC plan and follow-up therapies (Continue with currentl therapies)    Equipment Recommendations  None recommended by OT (Pt reorts she has been in the process of getting a "travel wc" with narrower base to fit into her bathroom)       Precautions / Restrictions Precautions Precautions: Fall Precaution Comments: wound vac Restrictions Weight Bearing Restrictions: Yes LLE Weight Bearing: Non weight bearing      Mobility Bed Mobility Overal bed mobility: Modified Independent             General bed mobility comments: +HOB elevated and use of rails    Transfers Overall transfer level: Needs assistance Equipment used: Rolling walker (2 wheeled) Transfers: Sit to/from [facility] Sit to Stand: Min  guard Stand pivot transfers: Min guard       General transfer comment: 1 vc for hand placement    Balance Overall balance assessment: Needs assistance Sitting-balance support: Single extremity supported Sitting balance-Leahy Scale: Good     Standing balance support: Bilateral upper extremity supported Standing balance-Leahy Scale: Poor           ADL either performed or assessed with clinical judgement   ADL Overall ADL's : Needs assistance/impaired Eating/Feeding: Independent;Sitting   Grooming: Set up;Sitting   Upper Body Bathing: Set up;Sitting   Lower Body Bathing: Moderate assistance;Sit to/from stand   Upper Body Dressing : Set up;Sitting   Lower Body Dressing: Moderate assistance;Sit to/from stand   Toilet Transfer: Min guard;Stand-pivot;BSC;RW   Toileting- Water quality scientist and Hygiene: Moderate assistance;Sit to/from stand       Functional mobility during ADLs: Min guard;Cueing for safety;Rolling walker (stand pivot only) General ADL Comments: Pt's lower bdoy ADLs require assist at this time due to pain and pt fear of falling, BUE reliant on extreal support in standing      Pertinent Vitals/Pain Pain Assessment: Faces Faces Pain Scale: Hurts little more Pain Location: L AKA with movement Pain Descriptors / Indicators: Discomfort;Guarding;Grimacing Pain Intervention(s): Monitored during session;Patient requesting pain meds-RN notified     Hand Dominance Right   Extremity/Trunk Assessment Upper Extremity Assessment Upper Extremity Assessment: Overall WFL for tasks assessed   Lower Extremity Assessment Lower Extremity Assessment: Defer to PT evaluation   Cervical / Trunk Assessment Cervical /  Trunk Assessment: Other exceptions (incrased body habitus)   Communication Communication Communication: No difficulties   Cognition Arousal/Alertness: Awake/alert Behavior During Therapy: WFL for tasks assessed/performed;Anxious Overall Cognitive  Status: Within Functional Limits for tasks assessed                 General Comments: Pt reports feelign anxious for therapy due to exacerbation of pain, "But I know I need to do it."   General Comments  VSS on RA, wound vac intact, pt requesting painmedication at the end of the session, RN notified     Home Living Family/patient expects to be discharged to:: Private residence Living Arrangements: Spouse/significant other Available Help at Discharge: Family;Friend(s);Available PRN/intermittently (husband currently in hospital) Type of Home: House Home Access: Ramped entrance     Home Layout: One level     Bathroom Shower/Tub: Teacher, early years/pre: Standard     Home Equipment: Walker - 2 wheels;Hand held shower head;Adaptive equipment;Cane - quad;Wheelchair - manual;Other (comment);Walker - 4 wheels;Tub bench Adaptive Equipment: Reacher;Long-handled shoe horn;Sock aid Additional Comments: ramped entry to garage, spouse in hospital      Prior Functioning/Environment Level of Independence: Needs assistance  Gait / Transfers Assistance Needed: pivoting to WC or BSC without use of RW l independently. ADL's / Homemaking Assistance Needed: doing sponge baths prior to this admission, has aide to assist   Comments: Sleeping in the recliner at home. Also, reports having to go to outpt PT b/c of dogs at home and can't do [facility]        OT Problem List: Decreased strength;Decreased range of motion;Decreased activity tolerance;Impaired balance (sitting and/or standing);Decreased safety awareness;Decreased knowledge of use of DME or AE;Decreased knowledge of precautions;Pain      OT Treatment/Interventions: Self-care/ADL training;Therapeutic exercise;DME and/or AE instruction;Therapeutic activities;Patient/family education;Balance training    OT Goals(Current goals can be found in the care plan section) Acute Rehab OT Goals Patient Stated Goal: home monday OT Goal  Formulation: With patient Time For Goal Achievement: 12/14/20 Potential to Achieve Goals: Fair ADL Goals Pt Will Perform Lower Body Bathing: with set-up;sit to/from stand Pt Will Perform Lower Body Dressing: with set-up;sit to/from stand Pt Will Transfer to Toilet: with modified independence;stand pivot transfer;bedside commode Pt Will Perform Tub/Shower Transfer: with supervision;with transfer board;rolling walker  OT Frequency: Min 2X/week   Barriers to D/C: Decreased caregiver support  pt's husband is currently in the hospital, no other option for support       Co-evaluation PT/OT/SLP Co-Evaluation/Treatment: Yes Reason for Co-Treatment: Complexity of the patient's impairments (multi-system involvement);For patient/therapist safety;To address functional/ADL transfers   OT goals addressed during session: ADL's and self-care;Proper use of Adaptive equipment and DME      AM-PAC OT "6 Clicks" Daily Activity     Outcome Measure Help from another person eating meals?: None Help from another person taking care of personal grooming?: A Little Help from another person toileting, which includes using toliet, bedpan, or urinal?: A Little Help from another person bathing (including washing, rinsing, drying)?: A Lot Help from another person to put on and taking off regular upper body clothing?: A Little Help from another person to put on and taking off regular lower body clothing?: A Lot 6 Click Score: 17   End of Session Equipment Utilized During Treatment: Rolling walker Nurse Communication: Mobility status;Precautions;Weight bearing status  Activity Tolerance: Patient tolerated treatment well Patient left: in chair;with call bell/phone within reach;with family/visitor present  OT Visit Diagnosis: Unsteadiness on feet (R26.81);Muscle weakness (generalized) (M62.81);Pain  Time: [id number] OT Time Calculation (min): 23 min Charges:  OT General Charges $OT Visit: 1  Visit OT Evaluation $OT Eval Moderate Complexity: 1 Mod    Leshea Jaggers A Meline Russaw 11/30/2020, 11:06 AM

## 2020-11-30 NOTE — Evaluation (Signed)
Physical Therapy Evaluation Patient Details Name: [name] MRN: [id number] DOB: July 23, 1955 Today's Date: 11/30/2020   History of Present Illness  The pt is a 65 yo female who presented 8/12 for revision of L AKA and removal of deep hardware. 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 PMH includes L femur IMN, HTN, obesity, bipolar disorder, ETOH use.   Clinical Impression  Pt in bed upon arrival of PT, agreeable to evaluation at this time. Prior to admission the pt was completing pivot transfers to [facility] or BSC without physical assist, reports she has been managing ADLs well with assist of aide following d/c home from SNF. The pt now presents with limitations in functional mobility, power, endurance, and dynamic stability due to above dx, and will continue to benefit from skilled PT to address these deficits. The pt was able to complete bed mobility as well as initial pivot transfer with good power through RLE and safety. She will benefit from continued skilled PT to address progression of ambulation, muscular endurance, and ease of completing pivot transfers in all directions to various surfaces.      Follow Up Recommendations Home health PT;Supervision for mobility/OOB    Equipment Recommendations  None recommended by PT (pt well equipped)    Recommendations for Other Services       Precautions / Restrictions Precautions Precautions: Fall Precaution Comments: wound vac Restrictions Weight Bearing Restrictions: Yes LLE Weight Bearing: Non weight bearing      Mobility  Bed Mobility Overal bed mobility: Modified Independent             General bed mobility comments: +HOB elevated and use of rails    Transfers Overall transfer level: Needs assistance Equipment used: Rolling walker (2 wheeled) Transfers: Sit to/from [facility] Sit to Stand: Min guard Stand pivot transfers: Min guard       General transfer comment: 1 vc for hand  placement  Ambulation/Gait             General Gait Details: pt did not attempt to take steps at this time, pivoting only due to pain      Balance Overall balance assessment: Needs assistance Sitting-balance support: Single extremity supported Sitting balance-Leahy Scale: Good     Standing balance support: Bilateral upper extremity supported Standing balance-Leahy Scale: Poor                               Pertinent Vitals/Pain Pain Assessment: Faces Faces Pain Scale: Hurts little more Pain Location: L AKA with movement Pain Descriptors / Indicators: Discomfort;Guarding;Grimacing Pain Intervention(s): Limited activity within patient's tolerance;Monitored during session;Repositioned;Patient requesting pain meds-RN notified    Home Living Family/patient expects to be discharged to:: Private residence Living Arrangements: Spouse/significant other Available Help at Discharge: Family;Friend(s);Available PRN/intermittently (husband currently in hospital) Type of Home: House Home Access: Ramped entrance     Home Layout: One level Home Equipment: Walker - 2 wheels;Hand held shower head;Adaptive equipment;Cane - quad;Wheelchair - manual;Other (comment);Walker - 4 wheels;Tub bench Additional Comments: ramped entry to garage, spouse in hospital    Prior Function Level of Independence: Needs assistance   Gait / Transfers Assistance Needed: pivoting to WC or BSC without use of RW l independently.  ADL's / Homemaking Assistance Needed: doing sponge baths prior to this admission, has aide to assist  Comments: Sleeping in the recliner at home. Also, reports having to go to outpt PT b/c of  dogs at home and can't do [place]     Hand Dominance   Dominant Hand: Right    Extremity/Trunk Assessment   Upper Extremity Assessment Upper Extremity Assessment: Defer to OT evaluation    Lower Extremity Assessment Lower Extremity Assessment: LLE deficits/detail LLE Deficits /  Details: pt able to move L stump against gravity, concerned due to pain. assessment limited by pain and NWB LLE: Unable to fully assess due to pain;Unable to fully assess due to immobilization    Cervical / Trunk Assessment Cervical / Trunk Assessment: Other exceptions Cervical / Trunk Exceptions: increased body habitus  Communication   Communication: No difficulties  Cognition Arousal/Alertness: Awake/alert Behavior During Therapy: WFL for tasks assessed/performed;Anxious Overall Cognitive Status: Within Functional Limits for tasks assessed                                 General Comments: Pt reports feelign anxious for therapy due to exacerbation of pain, "But I know I need to do it."      General Comments General comments (skin integrity, edema, etc.): VSS on RA, wound vac intact, pt requesting painmedication at the end of the session, RN notified     PT Assessment Patient needs continued PT services  PT Problem List Decreased strength;Decreased range of motion;Decreased activity tolerance;Decreased balance;Decreased mobility;Pain       PT Treatment Interventions DME instruction;Gait training;Stair training;Functional mobility training;Therapeutic activities;Therapeutic exercise;Balance training;Patient/family education    PT Goals (Current goals can be found in the Care Plan section)  Acute Rehab PT Goals Patient Stated Goal: home monday PT Goal Formulation: With patient Time For Goal Achievement: 12/14/20 Potential to Achieve Goals: Good    Frequency Min 5X/week   Barriers to discharge        Co-evaluation PT/OT/SLP Co-Evaluation/Treatment: Yes Reason for Co-Treatment: Complexity of the patient's impairments (multi-system involvement);For patient/therapist safety;To address functional/ADL transfers PT goals addressed during session: Balance;Mobility/safety with mobility OT goals addressed during session: ADL's and self-care;Proper use of Adaptive  equipment and DME       AM-PAC PT "6 Clicks" Mobility  Outcome Measure Help needed turning from your back to your side while in a flat bed without using bedrails?: A Little Help needed moving from lying on your back to sitting on the side of a flat bed without using bedrails?: A Little Help needed moving to and from a bed to a chair (including a wheelchair)?: A Little Help needed standing up from a chair using your arms (e.g., wheelchair or bedside chair)?: A Little Help needed to walk in hospital room?: A Lot Help needed climbing 3-5 steps with a railing? : Total 6 Click Score: 15    End of Session Equipment Utilized During Treatment: Gait belt (wound vac) Activity Tolerance: Patient tolerated treatment well Patient left: in chair;with call bell/phone within reach;with chair alarm set Nurse Communication: Mobility status;Patient requests pain meds PT Visit Diagnosis: Other abnormalities of gait and mobility (R26.89);Muscle weakness (generalized) (M62.81);Pain Pain - Right/Left: Left Pain - part of body: Leg    Time: [id number] PT Time Calculation (min) (ACUTE ONLY): 23 min   Charges:   PT Evaluation $PT Eval Low Complexity: 1 Low          Valene Villa Allen Kell, PT, DPT   Acute Rehabilitation Department Pager #: [phone]  Sandra Cockayne 11/30/2020, 11:47 AM

## 2020-12-01 LAB — BASIC METABOLIC PANEL
Anion gap: 4 — ABNORMAL LOW (ref 5–15)
BUN: 5 mg/dL — ABNORMAL LOW (ref 8–23)
CO2: 30 mmol/L (ref 22–32)
Calcium: 8.3 mg/dL — ABNORMAL LOW (ref 8.9–10.3)
Chloride: 105 mmol/L (ref 98–111)
Creatinine, Ser: 0.46 mg/dL (ref 0.44–1.00)
GFR, Estimated: >60 mL/min (ref >60)
Glucose, Bld: 106 mg/dL — ABNORMAL HIGH (ref 70–99)
Potassium: 3.8 mmol/L (ref 3.5–5.1)
Sodium: 139 mmol/L (ref 135–145)

## 2020-12-01 LAB — CBC
HCT: 26.7 % — ABNORMAL LOW (ref 36.0–46.0)
Hemoglobin: 8.1 g/dL — ABNORMAL LOW (ref 12.0–15.0)
MCH: 26.2 pg (ref 26.0–34.0)
MCHC: 30.3 g/dL (ref 30.0–36.0)
MCV: 86.4 fL (ref 80.0–100.0)
Platelets: 312 10*3/uL (ref 150–400)
RBC: 3.09 MIL/uL — ABNORMAL LOW (ref 3.87–5.11)
RDW: 16.3 % — ABNORMAL HIGH (ref 11.5–15.5)
WBC: 6.4 10*3/uL (ref 4.0–10.5)
nRBC: 0 % (ref 0.0–0.2)

## 2020-12-01 NOTE — Progress Notes (Signed)
Patient requesting all 4 side rails up to help with movement in the bed.

## 2020-12-01 NOTE — Plan of Care (Signed)

## 2020-12-01 NOTE — Progress Notes (Signed)
Patient is POD 2 s/p left AKA.  Reports that she is doing well without any complaints aside from a little bit of achiness at the stump site today.  Wound VAC is in place with suction intact.  No drainage in the wound VAC canister.  Denies any fevers, chills, night sweats.  No surrounding cellulitis around the periphery of the wound VAC sponge.  She is sitting comfortably in bed knitting.  Denies any chest pain, shortness of breath, calf pain of the contralateral leg.  She has not had a bowel movement but this does not concern her as she "never has a bowel movement in the hospital".  No abdominal pain.  Plan for reevaluation by Dr. Sharol Given or Haynes Dage tomorrow morning.

## 2020-12-02 MED ORDER — OXYCODONE HCL 10 MG PO TABS: 10.0000 mg | ORAL_TABLET | ORAL | 0 refills | Status: DC | PRN

## 2020-12-02 NOTE — Progress Notes (Signed)
Occupational Therapy Treatment Patient Details Name: Vashanti Randlett MRN: [id number] DOB: 1955/12/08 Today's Date: 12/02/2020    History of present illness The pt is a 65 yo female who presented 8/12 for revision of L AKA and removal of deep hardware. 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 PMH includes L femur IMN, HTN, obesity, bipolar disorder, ETOH use.   OT comments  Levonda is progressing well with plans to dc home today. Session focused on compensatory techniques and transfers. Pt was able to complete dressing/bathing with set up while sitting EOB. However she required mod A +2 for sit<>stand from lower surfaces this session and required max A for rear peri care due to not having her baseline toilet tongs. Pt would benefit from continued OT acutely should she stay. D/c plan remains appropriate.    Follow Up Recommendations  Other (comment);Follow surgeon's recommendation for DC plan and follow-up therapies (continue current therapies)    Equipment Recommendations  None recommended by OT       Precautions / Restrictions Precautions Precautions: Fall Precaution Comments: wound vac Restrictions Weight Bearing Restrictions: Yes LLE Weight Bearing: Non weight bearing       Mobility Bed Mobility Overal bed mobility: Modified Independent             General bed mobility comments: +HOB elevated and use of rails    Transfers Overall transfer level: Needs assistance Equipment used: Rolling walker (2 wheeled) (BSC) Transfers: Sit to/from [facility] Sit to Stand: Mod assist;+2 physical assistance Stand pivot transfers: Min guard       General transfer comment: incrased A this session for sit<>stand from lower surfaces    Balance Overall balance assessment: Needs assistance Sitting-balance support: Single extremity supported Sitting balance-Leahy Scale: Good     Standing balance support: Bilateral upper extremity supported Standing  balance-Leahy Scale: Poor           ADL either performed or assessed with clinical judgement   ADL Overall ADL's : Needs assistance/impaired         Upper Body Bathing: Set up;Sitting       Upper Body Dressing : Set up;Sitting       Toilet Transfer: Min guard;Stand-pivot;BSC;RW   Toileting- Clothing Manipulation and Hygiene: Maximal assistance;Sit to/from stand Toileting - Clothing Manipulation Details (indicate cue type and reason): Pt required max A for rear peri care in standing, at baseline she uses toilet tongs which were not available at this time     Functional mobility during ADLs: Moderate assistance;+2 for physical assistance;+2 for safety/equipment;Rolling walker General ADL Comments: incrased assistance levels this session fo rall transfers due to transfer to/ from lower surfaces than the pt is used to. required mod A +2 physcial A to boost from lower surfaces. Once standing with RW pt is min guard      Cognition Arousal/Alertness: Awake/alert Behavior During Therapy: WFL for tasks assessed/performed;Anxious Overall Cognitive Status: Within Functional Limits for tasks assessed                                 General Comments: eager to go home              General Comments No new concers, pt excirted to go home. Husband is still in the hospital. Pt had BM this session    Pertinent Vitals/ Pain       Pain Assessment: Faces Faces Pain Scale: Hurts  little more Pain Location: L AKA with movement Pain Descriptors / Indicators: Discomfort;Guarding;Grimacing Pain Intervention(s): Monitored during session         Frequency  Min 2X/week        Progress Toward Goals  OT Goals(current goals can now be found in the care plan section)  Progress towards OT goals: Progressing toward goals  Acute Rehab OT Goals Patient Stated Goal: home monday OT Goal Formulation: With patient Time For Goal Achievement: 12/14/20 Potential to Achieve  Goals: Fair ADL Goals Pt Will Perform Lower Body Bathing: with set-up;sit to/from stand Pt Will Perform Lower Body Dressing: with set-up;sit to/from stand Pt Will Transfer to Toilet: with modified independence;stand pivot transfer;bedside commode Pt Will Perform Tub/Shower Transfer: with supervision;with transfer board;rolling walker  Plan Discharge plan remains appropriate       AM-PAC OT "6 Clicks" Daily Activity     Outcome Measure   Help from another person eating meals?: None Help from another person taking care of personal grooming?: A Little Help from another person toileting, which includes using toliet, bedpan, or urinal?: A Little Help from another person bathing (including washing, rinsing, drying)?: A Lot Help from another person to put on and taking off regular upper body clothing?: A Little Help from another person to put on and taking off regular lower body clothing?: A Lot 6 Click Score: 17    End of Session Equipment Utilized During Treatment: Rolling walker  OT Visit Diagnosis: Unsteadiness on feet (R26.81);Muscle weakness (generalized) (M62.81);Pain   Activity Tolerance  (bari BSC)   Patient Left in bed;with call bell/phone within reach   Nurse Communication Mobility status;Precautions;Weight bearing status        Time: [id number] OT Time Calculation (min): 40 min  Charges: OT General Charges $OT Visit: 1 Visit OT Treatments $Self Care/Home Management : 38-52 mins     Maanya Hippert A Zadiel Leyh 12/02/2020, 1:04 PM

## 2020-12-02 NOTE — Progress Notes (Signed)
Pt changed from hospital VAC to [facility] for discharge.

## 2020-12-02 NOTE — Discharge Summary (Signed)
Discharge Diagnoses:  Active Problems:   Wound dehiscence   Presence of retained hardware   Pressure injury of skin   Surgeries: Procedure(s): REVISION LEFT ABOVE KNEE AMPUTATION with removal of deep hardware on 11/29/2020    Consultants:   Discharged Condition: Improved  Hospital Course: [name] is an 65 y.o. female who was admitted 11/29/2020 with a chief complaint of left knee wound dehiscence, with a final diagnosis of Necrosis Left Above Knee Amputation.  Patient was brought to the operating room on 11/29/2020 and underwent Procedure(s): REVISION LEFT ABOVE KNEE AMPUTATION with removal of deep hardware.    Patient was given perioperative antibiotics:  Anti-infectives (From admission, onward)    Start     Dose/Rate Route Frequency Ordered Stop   11/29/20 2030  ceFAZolin (ANCEF) IVPB 2g/100 mL premix        2 g 200 mL/hr over 30 Minutes Intravenous Every 8 hours 11/29/20 1622 11/30/20 0535   11/29/20 1000  ceFAZolin (ANCEF) IVPB 3g/100 mL premix        3 g 200 mL/hr over 30 Minutes Intravenous On call to O.R. 11/29/20 5456 11/29/20 1219     .  Patient was given sequential compression devices, early ambulation, and aspirin for DVT prophylaxis.  Recent vital signs: Patient Vitals for the past 24 hrs:  BP Temp Temp src Pulse Resp SpO2  12/02/20 0412 133/70 98.3 F (36.8 C) Oral 76 19 94 %  12/01/20 2131 140/66 98.3 F (36.8 C) Oral 84 18 96 %  12/01/20 1717 (!) 155/84 98.3 F (36.8 C) Oral 87 18 99 %  12/01/20 0814 121/61 97.6 F (36.4 C) Oral 75 18 97 %  .  Recent laboratory studies: No results found.  Discharge Medications:   Allergies as of 12/02/2020       Reactions   Morphine And Related Itching   Tolerates fine with antihistamine (benadryl or cetirizine)    Oxycodone Itching   Tolerates fine with antihistamine (benadryl or cetirizine)         Medication List     STOP taking these medications    acetaminophen 500 MG tablet Commonly known as:  TYLENOL   cephALEXin 500 MG capsule Commonly known as: KEFLEX   gabapentin 800 MG tablet Commonly known as: NEURONTIN   nicotine 21 mg/24hr patch Commonly known as: NICODERM CQ - dosed in mg/24 hours   nicotine 7 mg/24hr patch Commonly known as: NICODERM CQ - dosed in mg/24 hr   nutrition supplement (JUVEN) Pack   zinc sulfate 220 (50 Zn) MG capsule       TAKE these medications    ALPRAZolam 0.5 MG tablet Commonly known as: XANAX Take 0.5 mg by mouth every 8 (eight) hours as needed for anxiety.   amLODipine 5 MG tablet Commonly known as: NORVASC Take 5 mg by mouth in the morning.   ARIPiprazole 5 MG tablet Commonly known as: ABILIFY Take 1 tablet (5 mg total) by mouth daily.   ascorbic acid 1000 MG tablet Commonly known as: VITAMIN C Take 1 tablet (1,000 mg total) by mouth daily.   celecoxib 200 MG capsule Commonly known as: CELEBREX Take 200 mg by mouth 2 (two) times daily.   cetirizine 10 MG tablet Commonly known as: ZYRTEC Take 10 mg by mouth daily as needed for allergies.   cyclobenzaprine 10 MG tablet Commonly known as: FLEXERIL Take 1 tablet (10 mg total) by mouth 3 (three) times daily as needed for muscle spasms.   diphenhydrAMINE 25 mg capsule  Commonly known as: BENADRYL Take 1 capsule (25 mg total) by mouth every 6 (six) hours as needed for itching (if pain meds cause itching).   docusate sodium 100 MG capsule Commonly known as: COLACE Take 100 mg by mouth daily as needed for mild constipation.   famotidine 20 MG tablet Commonly known as: PEPCID Take 20 mg by mouth daily as needed for heartburn or indigestion.   furosemide 20 MG tablet Commonly known as: LASIX Take 20 mg by mouth daily as needed (fluid retention).   multivitamin with minerals Tabs tablet Take 1 tablet by mouth in the morning. Centrum Multivitamin for Women   Myrbetriq 25 MG Tb24 tablet Generic drug: mirabegron ER Take 25 mg by mouth in the morning.   naloxone 4  MG/0.1ML Liqd nasal spray kit Commonly known as: NARCAN Place 1 spray into the nose as needed (accidental overdose).   nystatin cream Commonly known as: MYCOSTATIN Apply 1 application topically 3 (three) times daily as needed (irritation).   omeprazole 20 MG capsule Commonly known as: PRILOSEC Take 20 mg by mouth in the morning.   Oxycodone HCl 10 MG Tabs Take 1-1.5 tablets (10-15 mg total) by mouth every 4 (four) hours as needed for severe pain (pain score 7-10). What changed:  medication strength how much to take reasons to take this Another medication with the same name was removed. Continue taking this medication, and follow the directions you see here.   Potassium Chloride ER 20 MEQ Tbcr Take 20 mEq by mouth in the morning.   pregabalin 150 MG capsule Commonly known as: LYRICA Take 150 mg by mouth 2 (two) times daily. What changed: Another medication with the same name was removed. Continue taking this medication, and follow the directions you see here.   Vitamin D3 50 MCG (2000 UT) Tabs Take by mouth in the morning.        Diagnostic Studies: No results found.  Patient benefited maximally from their hospital stay and there were no complications.     Disposition: Discharge disposition: 01-Home or Self Care      Discharge Instructions     Call MD / Call 911   Complete by: As directed    If you experience chest pain or shortness of breath, CALL 911 and be transported to the hospital emergency room.  If you develope a fever above 101 F, pus (white drainage) or increased drainage or redness at the wound, or calf pain, call your surgeon's office.   Constipation Prevention   Complete by: As directed    Drink plenty of fluids.  Prune juice may be helpful.  You may use a stool softener, such as Colace (over the counter) 100 mg twice a day.  Use MiraLax (over the counter) for constipation as needed.   Diet - low sodium heart healthy   Complete by: As directed     Increase activity slowly as tolerated   Complete by: As directed    Negative Pressure Wound Therapy - Incisional   Complete by: As directed    Show patient how to attach preveena vac   Post-operative opioid taper instructions:   Complete by: As directed    POST-OPERATIVE OPIOID TAPER INSTRUCTIONS: It is important to wean off of your opioid medication as soon as possible. If you do not need pain medication after your surgery it is ok to stop day one. Opioids include: Codeine, Hydrocodone(Norco, Vicodin), Oxycodone(Percocet, oxycontin) and hydromorphone amongst others.  Long term and even short term use of  opiods can cause: Increased pain response Dependence Constipation Depression Respiratory depression And more.  Withdrawal symptoms can include Flu like symptoms Nausea, vomiting And more Techniques to manage these symptoms Hydrate well Eat regular healthy meals Stay active Use relaxation techniques(deep breathing, meditating, yoga) Do Not substitute Alcohol to help with tapering If you have been on opioids for less than two weeks and do not have pain than it is ok to stop all together.  Plan to wean off of opioids This plan should start within one week post op of your joint replacement. Maintain the same interval or time between taking each dose and first decrease the dose.  Cut the total daily intake of opioids by one tablet each day Next start to increase the time between doses. The last dose that should be eliminated is the evening dose.          Follow-up Information     Suzan Slick, NP Follow up in 1 week(s).   Specialty: Orthopedic Surgery Contact information: [address] [place] [address] [phone]                  Signed: Bevely Palmer Orel Cooler 12/02/2020, 7:50 AM

## 2020-12-02 NOTE — Progress Notes (Signed)
Patient discharged home in stable condition. Verbalizes understanding of all discharge instructions, including home medications and follow up appointments. 

## 2020-12-05 ENCOUNTER — Ambulatory Visit (INDEPENDENT_AMBULATORY_CARE_PROVIDER_SITE_OTHER): Payer: BC Managed Care – PPO | Admitting: Physician Assistant

## 2020-12-05 ENCOUNTER — Encounter: Payer: Self-pay | Admitting: Orthopedic Surgery

## 2020-12-05 ENCOUNTER — Telehealth: Payer: Self-pay | Admitting: Orthopedic Surgery

## 2020-12-05 ENCOUNTER — Other Ambulatory Visit: Payer: Self-pay

## 2020-12-05 DIAGNOSIS — S78112A Complete traumatic amputation at level between left hip and knee, initial encounter: Secondary | ICD-10-CM

## 2020-12-05 DIAGNOSIS — Z89612 Acquired absence of left leg above knee: Secondary | ICD-10-CM

## 2020-12-05 NOTE — Progress Notes (Signed)
Office Visit Note   [name]           Date of Birth: 1956-04-02           MRN: [id number] Visit Date: 12/05/2020              Requested by: [name], MD [place],  [place] [address] PCP: [name], MD  Chief Complaint  Patient presents with   Left Leg - Routine Post Op    11/29/20 revision left AKA      HPI: Is a pleasant 65 year old woman who is 6 days status post revision above-knee amputation.  She called the office this morning because her wound VAC was beeping.  Shortly after that she stood up and had a significant amount of blood drained into the back.  She was quite concerned about this.  Assessment & Plan: Visit Diagnoses: No diagnosis found.  Plan: Patient will do daily cleansing with antibacterial soap and water and apply new clean dressing.  Follow-up in 1 week.  Follow-Up Instructions: No follow-ups on file.   Ortho Exam  Patient is alert, oriented, no adenopathy, well-dressed, normal affect, normal respiratory effort. Examination demonstrates some bloody drainage dark blood drainage in the wound VAC.  Wound is well approximated with sutures and staples in place.  No ascending cellulitis or signs of infection  Imaging: No results found. No images are attached to the encounter.  Labs: Lab Results  Component Value Date   HGBA1C 5.7 (H) 09/13/2020   ESRSEDRATE 17 10/07/2020   CRP 42.9 (H) 10/07/2020   REPTSTATUS 09/16/2020 FINAL 09/11/2020   GRAMSTAIN  09/11/2020    RARE WBC PRESENT, PREDOMINANTLY PMN NO ORGANISMS SEEN    CULT  09/11/2020    No growth aerobically or anaerobically. Performed at [facility], [place] [address], [place], [place] [address]      Lab Results  Component Value Date   ALBUMIN 3.0 (L) 03/04/2020   ALBUMIN 3.4 (L) 02/25/2020   ALBUMIN 3.9 01/17/2008    Lab Results  Component Value Date   MG 2.1 10/07/2020   Lab Results  Component Value Date   VD25OH 28.94 (L)  03/15/2020   VD25OH 18.03 (L) 02/25/2020    No results found for: PREALBUMIN CBC EXTENDED Latest Ref Rng & Units 12/01/2020 11/30/2020 10/20/2020  WBC 4.0 - 10.5 K/uL 6.4 8.3 8.2  RBC 3.87 - 5.11 MIL/uL 3.09(L) 3.28(L) 3.38(L)  HGB 12.0 - 15.0 g/dL 8.1(L) 8.7(L) 9.6(L)  HCT 36.0 - 46.0 % 26.7(L) 28.4(L) 30.5(L)  PLT 150 - 400 K/uL 312 341 291  NEUTROABS 1,500 - 7,800 cells/uL - - -  LYMPHSABS 850 - 3,900 cells/uL - - -     There is no height or weight on file to calculate BMI.  Orders:  No orders of the defined types were placed in this encounter.  No orders of the defined types were placed in this encounter.    Procedures: No procedures performed  Clinical Data: No additional findings.  ROS:  All other systems negative, except as noted in the HPI. Review of Systems  Objective: Vital Signs: There were no vitals taken for this visit.  Specialty Comments:  No specialty comments available.  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 ([facility])    Arthritis of left knee 09/20/2020   Septic arthritis of knee, left ([place]) 09/17/2020   Cellulitis and abscess of lower extremity 09/11/2020   Debility 05/02/2020   Urinary incontinence 05/02/2020   Pain in left shin    Urinary frequency    Sleep disturbance    Pain    Anxiety state    Urinary urgency    Drug induced constipation    Post-operative pain    Benign essential HTN    Pruritus    Left medial tibial plateau fracture 03/01/2020   Morbid obesity ([place])    Acute blood loss anemia    Postoperative pain    Tibial plateau fracture 02/25/2020   Tibial plateau fracture, left 02/25/2020   Hyponatremia 02/25/2020   Alcohol use  disorder, moderate, dependence ([place]) 02/25/2020   Essential hypertension 02/25/2020   Past Medical History:  Diagnosis Date   Anemia    Anxiety    Arthritis    Asthma    no longer treated   Bipolar disorder ([place])    off meds for 4-5 years   Depression    Hypertension     No family history on file.  Past Surgical History:  Procedure Laterality Date   ABDOMINAL HYSTERECTOMY     AMPUTATION Left 10/18/2020   Procedure: LEFT ABOVE KNEE AMPUTATION;  Surgeon: Newt Minion, MD;  Location: [facility];  Service: Orthopedics;  Laterality: Left;   BREAST SURGERY     left lumpectomy    gastric bypass surgery      HARDWARE REMOVAL Left 08/28/2020   Procedure: HARDWARE REMOVAL;  Surgeon: Hiram Gash, MD;  Location[facility];  Service: Orthopedics;  Laterality: Left;   hemorrhjoid surgery      IRRIGATION AND DEBRIDEMENT ABSCESS Left 09/11/2020   Procedure: Irrigation and debridement of deep abscess- left leg, septic joint;  Surgeon: Hiram Gash, MD;  Location[facility];  Service: Orthopedics;  Laterality: Left;   KNEE ARTHROSCOPY WITH MEDIAL MENISECTOMY Left 08/28/2020   Procedure: KNEE ARTHROSCOPY WITH CHONDROPLASTY, MANIPULATION WITH LYSIS OF ADHESIONS, POSSIBLE MEDIAL MENISECTOMY;  Surgeon: Hiram Gash, MD;  Location[facility];  Service: Orthopedics;  Laterality: Left;   left femur fracture surgery      x 2   ORIF TIBIA PLATEAU Left 02/26/2020   Procedure: OPEN REDUCTION INTERNAL FIXATION (ORIF) TIBIAL PLATEAU;  Surgeon: Shona Needles, MD;  Location: [facility];  Service: Orthopedics;  Laterality: Left;   STUMP REVISION Left 11/29/2020   Procedure: REVISION LEFT ABOVE KNEE AMPUTATION with removal of deep hardware;  Surgeon: Newt Minion, MD;  Location: [facility];  Service: Orthopedics;  Laterality: Left;   TUBAL LIGATION     Social History   Occupational History   Not on file  Tobacco Use   Smoking status: Former    Types: Cigars    Quit date: 11/14/2020    Years since quitting: 0.0   Smokeless  tobacco: Never  Vaping Use   Vaping Use: Former   Substances: Nicotine, Flavoring  Substance and Sexual Activity   Alcohol use: Yes    Alcohol/week: 18.0 standard drinks    Types: 18 Cans of beer per week    Comment: 18 beers per week   Drug use: Never   Sexual activity: Not on file

## 2020-12-05 NOTE — Telephone Encounter (Signed)
Pt called about her wound vac and it keeps beeping saying that its leaking.She would like to know how to fix it.   CB [phone]

## 2020-12-05 NOTE — Telephone Encounter (Signed)
Pt on her way to the office for wound vac check

## 2020-12-09 ENCOUNTER — Ambulatory Visit: Payer: BC Managed Care – PPO | Admitting: Orthopedic Surgery

## 2020-12-12 ENCOUNTER — Telehealth: Payer: Self-pay | Admitting: Orthopedic Surgery

## 2020-12-12 ENCOUNTER — Encounter: Payer: Self-pay | Admitting: Orthopedic Surgery

## 2020-12-12 ENCOUNTER — Ambulatory Visit (INDEPENDENT_AMBULATORY_CARE_PROVIDER_SITE_OTHER): Payer: BC Managed Care – PPO | Admitting: Orthopedic Surgery

## 2020-12-12 DIAGNOSIS — Z89612 Acquired absence of left leg above knee: Secondary | ICD-10-CM

## 2020-12-12 DIAGNOSIS — S78112A Complete traumatic amputation at level between left hip and knee, initial encounter: Secondary | ICD-10-CM

## 2020-12-12 MED ORDER — DOXYCYCLINE HYCLATE 100 MG PO TABS: 100.0000 mg | ORAL_TABLET | Freq: Two times a day (BID) | ORAL | 0 refills | Status: DC

## 2020-12-12 NOTE — Progress Notes (Signed)
Office Visit Note   [name]           Date of Birth: 01/30/1956           MRN: [id number] Visit Date: 12/12/2020              Requested by: [name], MD [place],  [place] [address] PCP: [name], MD  Chief Complaint  Patient presents with   Left Leg - Routine Post Op    11/29/20 revision left AKA      HPI: Patient is a 65 year old woman who presents 2 weeks status post left above-the-knee amputation revision.  She has swelling and some serosanguineous drainage.  Assessment & Plan: Visit Diagnoses:  1. 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[place])     Plan: Prescription is called in for doxycycline recommended protein supplement 30 g twice a day.  Follow-Up Instructions: Return in about 1 week (around 12/19/2020).   Ortho Exam  Patient is alert, oriented, no adenopathy, well-dressed, normal affect, normal respiratory effort. Examination patient has some cellulitis over the inferior flap there is slight wound dehiscence about a millimeter gapped open there is healthy granulation tissue there is clear serosanguineous drainage.  Imaging: No results found. No images are attached to the encounter.  Labs: Lab Results  Component Value Date   HGBA1C 5.7 (H) 09/13/2020   ESRSEDRATE 17 10/07/2020   CRP 42.9 (H) 10/07/2020   REPTSTATUS 09/16/2020 FINAL 09/11/2020   GRAMSTAIN  09/11/2020    RARE WBC PRESENT, PREDOMINANTLY PMN NO ORGANISMS SEEN    CULT  09/11/2020    No growth aerobically or anaerobically. Performed at [facility], [place] [address] [place], [place] [address]      Lab Results  Component Value Date   ALBUMIN 3.0 (L) 03/04/2020   ALBUMIN 3.4 (L) 02/25/2020   ALBUMIN 3.9 01/17/2008    Lab Results  Component Value Date   MG 2.1 10/07/2020   Lab Results  Component Value Date   VD25OH 28.94 (L) 03/15/2020   VD25OH 18.03 (L) 02/25/2020    No results found for: PREALBUMIN CBC  EXTENDED Latest Ref Rng & Units 12/01/2020 11/30/2020 10/20/2020  WBC 4.0 - 10.5 K/uL 6.4 8.3 8.2  RBC 3.87 - 5.11 MIL/uL 3.09(L) 3.28(L) 3.38(L)  HGB 12.0 - 15.0 g/dL 8.1(L) 8.7(L) 9.6(L)  HCT 36.0 - 46.0 % 26.7(L) 28.4(L) 30.5(L)  PLT 150 - 400 K/uL 312 341 291  NEUTROABS 1,500 - 7,800 cells/uL - - -  LYMPHSABS 850 - 3,900 cells/uL - - -     There is no height or weight on file to calculate BMI.  Orders:  No orders of the defined types were placed in this encounter.  No orders of the defined types were placed in this encounter.    Procedures: No procedures performed  Clinical Data: No additional findings.  ROS:  All other systems negative, except as noted in the HPI. Review of Systems  Objective: Vital Signs: There were no vitals taken for this visit.  Specialty Comments:  No specialty comments available.  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 ([facility])    Arthritis of left  knee 09/20/2020   Septic arthritis of knee, left ([place]) 09/17/2020   Cellulitis and abscess of lower extremity 09/11/2020   Debility 05/02/2020   Urinary incontinence 05/02/2020   Pain in left shin    Urinary frequency    Sleep disturbance    Pain    Anxiety state    Urinary urgency    Drug induced constipation    Post-operative pain    Benign essential HTN    Pruritus    Left medial tibial plateau fracture 03/01/2020   Morbid obesity ([employer])    Acute blood loss anemia    Postoperative pain    Tibial plateau fracture 02/25/2020   Tibial plateau fracture, left 02/25/2020   Hyponatremia 02/25/2020   Alcohol use disorder, moderate, dependence ([place]) 02/25/2020   Essential hypertension 02/25/2020   Past  Medical History:  Diagnosis Date   Anemia    Anxiety    Arthritis    Asthma    no longer treated   Bipolar disorder ([place])    off meds for 4-5 years   Depression    Hypertension     History reviewed. No pertinent family history.  Past Surgical History:  Procedure Laterality Date   ABDOMINAL HYSTERECTOMY     AMPUTATION Left 10/18/2020   Procedure: LEFT ABOVE KNEE AMPUTATION;  Surgeon: Newt Minion, MD;  Location: [facility];  Service: Orthopedics;  Laterality: Left;   BREAST SURGERY     left lumpectomy    gastric bypass surgery      HARDWARE REMOVAL Left 08/28/2020   Procedure: HARDWARE REMOVAL;  Surgeon: Hiram Gash, MD;  [facility];  Service: Orthopedics;  Laterality: Left;   hemorrhjoid surgery      IRRIGATION AND DEBRIDEMENT ABSCESS Left 09/11/2020   Procedure: Irrigation and debridement of deep abscess- left leg, septic joint;  Surgeon: Hiram Gash, MD;  [facility];  Service: Orthopedics;  Laterality: Left;   KNEE ARTHROSCOPY WITH MEDIAL MENISECTOMY Left 08/28/2020   Procedure: KNEE ARTHROSCOPY WITH CHONDROPLASTY, MANIPULATION WITH LYSIS OF ADHESIONS, POSSIBLE MEDIAL MENISECTOMY;  Surgeon: Hiram Gash, MD;  [facility];  Service: Orthopedics;  Laterality: Left;   left femur fracture surgery      x 2   ORIF TIBIA PLATEAU Left 02/26/2020   Procedure: OPEN REDUCTION INTERNAL FIXATION (ORIF) TIBIAL PLATEAU;  Surgeon: Shona Needles, MD;  Location: [facility];  Service: Orthopedics;  Laterality: Left;   STUMP REVISION Left 11/29/2020   Procedure: REVISION LEFT ABOVE KNEE AMPUTATION with removal of deep hardware;  Surgeon: Newt Minion, MD;  Location: [facility];  Service: Orthopedics;  Laterality: Left;   TUBAL LIGATION     Social History   Occupational History   Not on file  Tobacco Use   Smoking status: Former    Types: Cigars    Quit date: 11/14/2020    Years since quitting: 0.0   Smokeless tobacco: Never  Vaping Use   Vaping Use: Former   Substances: Nicotine,  Flavoring  Substance and Sexual Activity   Alcohol use: Yes    Alcohol/week: 18.0 standard drinks    Types: 18 Cans of beer per week    Comment: 18 beers per week   Drug use: Never   Sexual activity: Not on file

## 2020-12-12 NOTE — Telephone Encounter (Signed)
Pt called also requesting a handicap placard. Pt states she can pick it up on her next scheduled appt 12/20/20. Pt phone number is [phone].

## 2020-12-13 NOTE — Telephone Encounter (Signed)
I called pt to advise and she said that she will pick up at the time of her appt on Friday 12/20/20

## 2020-12-20 ENCOUNTER — Ambulatory Visit (INDEPENDENT_AMBULATORY_CARE_PROVIDER_SITE_OTHER): Payer: Medicare Other | Admitting: Family

## 2020-12-20 DIAGNOSIS — Z89512 Acquired absence of left leg below knee: Secondary | ICD-10-CM

## 2020-12-20 DIAGNOSIS — S78112A Complete traumatic amputation at level between left hip and knee, initial encounter: Secondary | ICD-10-CM

## 2020-12-24 ENCOUNTER — Ambulatory Visit (INDEPENDENT_AMBULATORY_CARE_PROVIDER_SITE_OTHER): Payer: BC Managed Care – PPO

## 2020-12-24 ENCOUNTER — Telehealth: Payer: Self-pay | Admitting: Family

## 2020-12-24 ENCOUNTER — Ambulatory Visit (INDEPENDENT_AMBULATORY_CARE_PROVIDER_SITE_OTHER): Payer: Medicare Other | Admitting: Orthopedic Surgery

## 2020-12-24 DIAGNOSIS — Z89612 Acquired absence of left leg above knee: Secondary | ICD-10-CM

## 2020-12-24 DIAGNOSIS — S78112A Complete traumatic amputation at level between left hip and knee, initial encounter: Secondary | ICD-10-CM

## 2020-12-24 NOTE — Telephone Encounter (Signed)
Pt stated she is returning message from [place]. Pt is asking for a call back from [place]. Please call pt at [phone].

## 2020-12-25 NOTE — Progress Notes (Signed)
Post-Op Visit Note   [name]           Date of Birth: Jun 15, 1955           MRN: [id number] Visit Date: 12/20/2020 PCP: [name], MD  Chief Complaint: No chief complaint on file.   HPI:  HPI Patient is a 65 year old woman seen postoperatively for above-knee amputation on the left overall the incision is healing well she is worried about some drainage she is having medially. she has been soaking through her dressings each day Ortho Exam On examination of the left above-knee amputation incision is healing well sutures are in place sutures harvested today without incident.  There is some very mild erythema along the incision medially she does have 1 area that probes this probes about 4 cm.  We were able to pack this with 34 cm of iodoform packing.  There is serosanguineous drainage, copious.  There is no odor no cellulitis  Visit Diagnoses: No diagnosis found.  Plan: Patient and her spouse voiced understanding they will do daily dressing changes and pack this open with iodoform.  Call or return for any worsening.  Follow-Up Instructions: No follow-ups on file.   Imaging: No results found.  Orders:  No orders of the defined types were placed in this encounter.  No orders of the defined types were placed in this encounter.    PMFS History: Patient Active Problem List   Diagnosis Date Noted   Pressure injury of skin 11/30/2020   Wound dehiscence 11/29/2020   Presence of retained hardware    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[place]) 11/06/2020   Knee osteomyelitis, left ([place]) 10/18/2020   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 (HCC)    Arthritis of left knee 09/20/2020   Septic arthritis of knee, left ([place]) 09/17/2020   Cellulitis and abscess of lower extremity 09/11/2020   Debility 05/02/2020   Urinary incontinence 05/02/2020   Pain in left shin    Urinary frequency    Sleep disturbance    Pain    Anxiety state    Urinary  urgency    Drug induced constipation    Post-operative pain    Benign essential HTN    Pruritus    Left medial tibial plateau fracture 03/01/2020   Morbid obesity ([place])    Acute blood loss anemia    Postoperative pain    Tibial plateau fracture 02/25/2020   Tibial plateau fracture, left 02/25/2020   Hyponatremia 02/25/2020   Alcohol use disorder, moderate, dependence ([place]) 02/25/2020   Essential hypertension 02/25/2020   Past Medical History:  Diagnosis Date   Anemia    Anxiety    Arthritis    Asthma    no longer treated   Bipolar disorder ([place])    off meds for 4-5 years   Depression    Hypertension     No family history on file.  Past Surgical History:  Procedure Laterality Date   ABDOMINAL HYSTERECTOMY     AMPUTATION Left 10/18/2020   Procedure: LEFT ABOVE KNEE AMPUTATION;  Surgeon: Newt Minion, MD;  Location: [facility];  Service: Orthopedics;  Laterality: Left;   BREAST SURGERY     left lumpectomy    gastric bypass surgery      HARDWARE REMOVAL Left 08/28/2020   Procedure: HARDWARE REMOVAL;  Surgeon: Hiram Gash, MD;  [facility];  Service: Orthopedics;  Laterality: Left;   hemorrhjoid surgery      IRRIGATION  AND DEBRIDEMENT ABSCESS Left 09/11/2020   Procedure: Irrigation and debridement of deep abscess- left leg, septic joint;  Surgeon: Hiram Gash, MD;  [facility];  Service: Orthopedics;  Laterality: Left;   KNEE ARTHROSCOPY WITH MEDIAL MENISECTOMY Left 08/28/2020   Procedure: KNEE ARTHROSCOPY WITH CHONDROPLASTY, MANIPULATION WITH LYSIS OF ADHESIONS, POSSIBLE MEDIAL MENISECTOMY;  Surgeon: Hiram Gash, MD;  [facility];  Service: Orthopedics;  Laterality: Left;   left femur fracture surgery      x 2   ORIF TIBIA PLATEAU Left 02/26/2020   Procedure: OPEN REDUCTION INTERNAL FIXATION (ORIF) TIBIAL PLATEAU;  Surgeon: Shona Needles, MD;  Location: [facility];  Service: Orthopedics;  Laterality: Left;   STUMP REVISION Left 11/29/2020   Procedure: REVISION LEFT  ABOVE KNEE AMPUTATION with removal of deep hardware;  Surgeon: Newt Minion, MD;  Location: [facility];  Service: Orthopedics;  Laterality: Left;   TUBAL LIGATION     Social History   Occupational History   Not on file  Tobacco Use   Smoking status: Former    Types: Cigars    Quit date: 11/14/2020    Years since quitting: 0.1   Smokeless tobacco: Never  Vaping Use   Vaping Use: Former   Substances: Nicotine, Flavoring  Substance and Sexual Activity   Alcohol use: Yes    Alcohol/week: 18.0 standard drinks    Types: 18 Cans of beer per week    Comment: 18 beers per week   Drug use: Never   Sexual activity: Not on file

## 2020-12-27 ENCOUNTER — Ambulatory Visit: Payer: BC Managed Care – PPO | Admitting: Family

## 2020-12-27 NOTE — Telephone Encounter (Signed)
Can you check on this?

## 2021-01-06 ENCOUNTER — Encounter: Payer: Self-pay | Admitting: Orthopedic Surgery

## 2021-01-06 NOTE — Progress Notes (Signed)
Office Visit Note   [name]           Date of Birth: [birth year]           MRN: [id number] Visit Date: 12/24/2020              Requested by: [name], MD [place],  [place] [address] PCP: [name], MD  Chief Complaint  Patient presents with   Left Leg - Routine Post Op    11/29/20 revision left AKA      HPI: Patient is a 65 year old woman who presents 4 weeks status post revision left above-the-knee amputation.  Patient has had the wound packed open with iodoform gauze home health nursing states the hole is closed.  Patient was told her pain may be a bone infection.  Assessment & Plan: Visit Diagnoses:  1. 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[facility])     Plan: Continue with wound care and compression resume her probiotics and discontinue the doxycycline.  Follow-Up Instructions: Return in about 2 weeks (around 01/07/2021).   Ortho Exam  Patient is alert, oriented, no adenopathy, well-dressed, normal affect, normal respiratory effort.   Examination there is no cellulitis no drainage she will stop her doxycycline resume probiotics.  She is given a prescription for [employer] for prosthesis.  The sutures are harvested.  Imaging: No results found. No images are attached to the encounter.  Labs: Lab Results  Component Value Date   HGBA1C 5.7 (H) 09/13/2020   ESRSEDRATE 17 10/07/2020   CRP 42.9 (H) 10/07/2020   REPTSTATUS 09/16/2020 FINAL 09/11/2020   GRAMSTAIN  09/11/2020    RARE WBC PRESENT, PREDOMINANTLY PMN NO ORGANISMS SEEN    CULT  09/11/2020    No growth aerobically or anaerobically. Performed at [facility], [place] [address] [place], [place] [address]      Lab Results  Component Value Date   ALBUMIN 3.0 (L) 03/04/2020   ALBUMIN 3.4 (L) 02/25/2020   ALBUMIN 3.9 01/17/2008    Lab Results  Component Value Date   MG 2.1 10/07/2020   Lab Results  Component Value Date   VD25OH 28.94 (L)  03/15/2020   VD25OH 18.03 (L) 02/25/2020    No results found for: PREALBUMIN CBC EXTENDED Latest Ref Rng & Units 12/01/2020 11/30/2020 10/20/2020  WBC 4.0 - 10.5 K/uL 6.4 8.3 8.2  RBC 3.87 - 5.11 MIL/uL 3.09(L) 3.28(L) 3.38(L)  HGB 12.0 - 15.0 g/dL 8.1(L) 8.7(L) 9.6(L)  HCT 36.0 - 46.0 % 26.7(L) 28.4(L) 30.5(L)  PLT 150 - 400 K/uL 312 341 291  NEUTROABS 1,500 - 7,800 cells/uL - - -  LYMPHSABS 850 - 3,900 cells/uL - - -     There is no height or weight on file to calculate BMI.  Orders:  Orders Placed This Encounter  Procedures   XR FEMUR MIN 2 VIEWS LEFT   No orders of the defined types were placed in this encounter.    Procedures: No procedures performed  Clinical Data: No additional findings.  ROS:  All other systems negative, except as noted in the HPI. Review of Systems  Objective: Vital Signs: There were no vitals taken for this visit.  Specialty Comments:  No specialty comments available.  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 ([facility])    Arthritis of left knee 09/20/2020   Septic arthritis of knee, left ([place]) 09/17/2020   Cellulitis and abscess of lower extremity 09/11/2020   Debility 05/02/2020   Urinary incontinence 05/02/2020   Pain in left shin    Urinary frequency    Sleep disturbance    Pain    Anxiety state    Urinary urgency    Drug induced constipation    Post-operative pain    Benign essential HTN    Pruritus    Left medial tibial plateau fracture 03/01/2020   Morbid obesity ([place])    Acute blood loss anemia    Postoperative pain    Tibial plateau fracture 02/25/2020   Tibial plateau fracture, left 02/25/2020   Hyponatremia 02/25/2020   Alcohol  use disorder, moderate, dependence ([place]) 02/25/2020   Essential hypertension 02/25/2020   Past Medical History:  Diagnosis Date   Anemia    Anxiety    Arthritis    Asthma    no longer treated   Bipolar disorder ([place])    off meds for 4-5 years   Depression    Hypertension     History reviewed. No pertinent family history.  Past Surgical History:  Procedure Laterality Date   ABDOMINAL HYSTERECTOMY     AMPUTATION Left 10/18/2020   Procedure: LEFT ABOVE KNEE AMPUTATION;  Surgeon: Newt Minion, MD;  Location: [facility];  Service: Orthopedics;  Laterality: Left;   BREAST SURGERY     left lumpectomy    gastric bypass surgery      HARDWARE REMOVAL Left 08/28/2020   Procedure: HARDWARE REMOVAL;  Surgeon: Hiram Gash, MD;  [facility];  Service: Orthopedics;  Laterality: Left;   hemorrhjoid surgery      IRRIGATION AND DEBRIDEMENT ABSCESS Left 09/11/2020   Procedure: Irrigation and debridement of deep abscess- left leg, septic joint;  Surgeon: Hiram Gash, MD;  [facility];  Service: Orthopedics;  Laterality: Left;   KNEE ARTHROSCOPY WITH MEDIAL MENISECTOMY Left 08/28/2020   Procedure: KNEE ARTHROSCOPY WITH CHONDROPLASTY, MANIPULATION WITH LYSIS OF ADHESIONS, POSSIBLE MEDIAL MENISECTOMY;  Surgeon: Hiram Gash, MD;  [facility];  Service: Orthopedics;  Laterality: Left;   left femur fracture surgery      x 2   ORIF TIBIA PLATEAU Left 02/26/2020   Procedure: OPEN REDUCTION INTERNAL FIXATION (ORIF) TIBIAL PLATEAU;  Surgeon: Shona Needles, MD;  Location: [facility];  Service: Orthopedics;  Laterality: Left;   STUMP REVISION Left 11/29/2020   Procedure: REVISION LEFT ABOVE KNEE AMPUTATION with removal of deep hardware;  Surgeon: Newt Minion, MD;  Location: [facility];  Service: Orthopedics;  Laterality: Left;   TUBAL LIGATION     Social History   Occupational History   Not on file  Tobacco Use   Smoking status: Former    Types: Cigars    Quit date: 11/14/2020    Years since  quitting: 0.1   Smokeless tobacco: Never  Vaping Use   Vaping Use: Former   Substances: Nicotine, Flavoring  Substance and Sexual Activity   Alcohol use: Yes    Alcohol/week: 18.0 standard drinks    Types: 18 Cans of beer per week    Comment: 18 beers per week   Drug use: Never   Sexual activity: Not on file

## 2021-01-07 ENCOUNTER — Other Ambulatory Visit: Payer: Self-pay

## 2021-01-07 ENCOUNTER — Ambulatory Visit (INDEPENDENT_AMBULATORY_CARE_PROVIDER_SITE_OTHER): Payer: Medicare Other | Admitting: Family

## 2021-01-07 ENCOUNTER — Encounter: Payer: Self-pay | Admitting: Family

## 2021-01-07 DIAGNOSIS — S78112A Complete traumatic amputation at level between left hip and knee, initial encounter: Secondary | ICD-10-CM

## 2021-01-07 DIAGNOSIS — Z89612 Acquired absence of left leg above knee: Secondary | ICD-10-CM

## 2021-01-07 NOTE — Progress Notes (Signed)
Post-Op Visit Note   [name]           Date of Birth: 1955-10-22           MRN: [id number] Visit Date: 01/07/2021 PCP: [name], MD  Chief Complaint:  Chief Complaint  Patient presents with   Left Knee - Follow-up    HPI:  HPI The patient is a 65 year old woman who presents postoperatively left above-knee amputation on August 12.  She is quite pleased with her healing.  Ortho Exam On examination of the left above-knee amputation incision this is well-healed.  There is 1 suture portal where she is getting serous drainage there is no wound this does not probe.  The there is 1 other area along the incision that she has serous drainage from.  I am not able to explore this area there is no surrounding erythema no odor no purulence  Visit Diagnoses: No diagnosis found.  Plan: She will follow-up in the office in 2 months.  Discussed return precautions.  Patient voiced understanding  Follow-Up Instructions: No follow-ups on file.   Imaging: No results found.  Orders:  No orders of the defined types were placed in this encounter.  No orders of the defined types were placed in this encounter.    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 ([facility])    Arthritis of left knee 09/20/2020   Septic arthritis of knee, left ([place]) 09/17/2020   Cellulitis and abscess of lower extremity 09/11/2020   Debility 05/02/2020   Urinary incontinence 05/02/2020   Pain in left shin    Urinary frequency    Sleep disturbance    Pain    Anxiety state    Urinary urgency    Drug induced constipation    Post-operative pain    Benign essential HTN    Pruritus    Left medial tibial plateau  fracture 03/01/2020   Morbid obesity ([place])    Acute blood loss anemia    Postoperative pain    Tibial plateau fracture 02/25/2020   Tibial plateau fracture, left 02/25/2020   Hyponatremia 02/25/2020   Alcohol use disorder, moderate, dependence ([place]) 02/25/2020   Essential hypertension 02/25/2020   Past Medical History:  Diagnosis Date   Anemia    Anxiety    Arthritis    Asthma    no longer treated   Bipolar disorder ([place])    off meds for 4-5 years   Depression    Hypertension     No family history on file.  Past Surgical History:  Procedure Laterality Date   ABDOMINAL HYSTERECTOMY     AMPUTATION Left 10/18/2020   Procedure: LEFT ABOVE KNEE AMPUTATION;  Surgeon: Newt Minion, MD;  Location: [facility];  Service: Orthopedics;  Laterality: Left;   BREAST SURGERY     left lumpectomy    gastric bypass surgery      HARDWARE REMOVAL Left 08/28/2020   Procedure: HARDWARE REMOVAL;  Surgeon: Hiram Gash, MD;  [facility];  Service: Orthopedics;  Laterality: Left;   hemorrhjoid surgery      IRRIGATION AND DEBRIDEMENT ABSCESS Left 09/11/2020   Procedure: Irrigation and debridement of deep abscess- left leg, septic  joint;  Surgeon: Hiram Gash, MD;  [facility];  Service: Orthopedics;  Laterality: Left;   KNEE ARTHROSCOPY WITH MEDIAL MENISECTOMY Left 08/28/2020   Procedure: KNEE ARTHROSCOPY WITH CHONDROPLASTY, MANIPULATION WITH LYSIS OF ADHESIONS, POSSIBLE MEDIAL MENISECTOMY;  Surgeon: Hiram Gash, MD;  [facility];  Service: Orthopedics;  Laterality: Left;   left femur fracture surgery      x 2   ORIF TIBIA PLATEAU Left 02/26/2020   Procedure: OPEN REDUCTION INTERNAL FIXATION (ORIF) TIBIAL PLATEAU;  Surgeon: Shona Needles, MD;  Location: [facility];  Service: Orthopedics;  Laterality: Left;   STUMP REVISION Left 11/29/2020   Procedure: REVISION LEFT ABOVE KNEE AMPUTATION with removal of deep hardware;  Surgeon: Newt Minion, MD;  Location: [facility];  Service: Orthopedics;   Laterality: Left;   TUBAL LIGATION     Social History   Occupational History   Not on file  Tobacco Use   Smoking status: Former    Types: Cigars    Quit date: 11/14/2020    Years since quitting: 0.1   Smokeless tobacco: Never  Vaping Use   Vaping Use: Former   Substances: Nicotine, Flavoring  Substance and Sexual Activity   Alcohol use: Yes    Alcohol/week: 18.0 standard drinks    Types: 18 Cans of beer per week    Comment: 18 beers per week   Drug use: Never   Sexual activity: Not on file

## 2021-01-31 ENCOUNTER — Telehealth: Payer: Self-pay | Admitting: Orthopedic Surgery

## 2021-01-31 ENCOUNTER — Other Ambulatory Visit: Payer: Self-pay

## 2021-01-31 DIAGNOSIS — S78112A Complete traumatic amputation at level between left hip and knee, initial encounter: Secondary | ICD-10-CM

## 2021-01-31 NOTE — Telephone Encounter (Signed)
Pt asking for a referral from Dr. Cherylynn Ridges. The surg date was 10/18/20. The best call back number is [phone].

## 2021-01-31 NOTE — Telephone Encounter (Signed)
I called and sw pt she was wanting a referral for physical therapy s/p left AKA this was entered will call with questions.

## 2021-02-13 ENCOUNTER — Ambulatory Visit (INDEPENDENT_AMBULATORY_CARE_PROVIDER_SITE_OTHER): Payer: BC Managed Care – PPO | Admitting: Physical Therapy

## 2021-02-13 ENCOUNTER — Other Ambulatory Visit: Payer: Self-pay

## 2021-02-13 ENCOUNTER — Encounter: Payer: Self-pay | Admitting: Physical Therapy

## 2021-02-13 DIAGNOSIS — M6281 Muscle weakness (generalized): Secondary | ICD-10-CM

## 2021-02-13 DIAGNOSIS — R2681 Unsteadiness on feet: Secondary | ICD-10-CM | POA: Diagnosis not present

## 2021-02-13 DIAGNOSIS — R2689 Other abnormalities of gait and mobility: Secondary | ICD-10-CM

## 2021-02-13 DIAGNOSIS — R293 Abnormal posture: Secondary | ICD-10-CM

## 2021-02-13 DIAGNOSIS — Z9181 History of falling: Secondary | ICD-10-CM | POA: Diagnosis not present

## 2021-02-13 DIAGNOSIS — M25652 Stiffness of left hip, not elsewhere classified: Secondary | ICD-10-CM

## 2021-02-13 NOTE — Patient Instructions (Addendum)
If wearing long pants, put pants on prosthesis first. Feed prosthesis thru left pant leg. You may have to take the shoe off.   1. Turn liner inside out. Make sure label is on top so strap is in front of leg. Fold liner along seams and place circle against bottom of your leg with fold level. Roll liner up your leg. You may need to scoot forward and turn to your right to clear your leg from w/c bottom.  2.  Feed strap thru slit in your prosthesis. Make sure strap is not twisted. Tighten strap when sitting so greater than 2 inches of rough and soft velcro overlap.  3. Stand up to a walker or counter. Wiggle your leg down into the prosthesis. Pull strap out of hole first then disconnect velcro & tighten as much as possible. 4. Pick left leg up 5-10 times putting weight on & off prosthesis. Readjust, pull strap out of hole first then disconnect velcro & tighten as much as possible. 5. Step left leg out & back 5-10 times putting weight on & off prosthesis. Readjust, pull strap out of hole first then disconnect velcro & tighten as much as possible.    6. Check strap only when standing. Good to check when finish toileting when standing before you pull your pants back up.    Standing Up Scoot your bottom to edge of chair. You should not be able to see chair between your legs. Position prosthesis so the heel is touching the floor and prosthesis toes are barely off the ground. Bring right foot close to chair but not under the chair. Place walker so it is ~6 inches in front of your chair, NOT against the chair. Place left hand on walker & right hand on chair to push. When standing up, you need to keep your prosthesis heel touching the ground which will straighten the prosthesis knee.  Once the prosthesis knee straightens & ONLY once the knee is straight, reach right hand to walker and straighten your back. Pull prosthesis back under your hips / pelvis.  Sitting Down: Back right leg back so back of leg is  touching the chair. Prosthesis toes should be ~2 inches back to right foot toes. Your prosthesis is out a little.  Take weight off prosthesis and bend the prosthesis knee a little bit.  Bow your upper body towards front of walker  Reach right hand for chair. Sit down

## 2021-02-13 NOTE — Therapy (Signed)
[facility] [address] [place], [place], [address] Phone: [phone]   Fax:  [phone]  Physical Therapy Evaluation  Patient Details  Name: [name] MRN: [id number] Date of Birth: 10-26-1955 Referring Provider (PT): Meridee Score, MD   Encounter Date: 02/13/2021   PT End of Session - 02/13/21 1437     Visit Number 1    Number of Visits 50    Date for PT Re-Evaluation 05/14/21    Authorization Type BCBS primary & Medicare 2nd    Progress Note Due on Visit 10    PT Start Time 0923    PT Stop Time 1015    PT Time Calculation (min) 52 min    Activity Tolerance Patient tolerated treatment well;Patient limited by fatigue    Behavior During Therapy [facility] for tasks assessed/performed             Past Medical History:  Diagnosis Date   Anemia    Anxiety    Arthritis    Asthma    no longer treated   Bipolar disorder ([place])    off meds for 4-5 years   Depression    Hypertension     Past Surgical History:  Procedure Laterality Date   ABDOMINAL HYSTERECTOMY     AMPUTATION Left 10/18/2020   Procedure: LEFT ABOVE KNEE AMPUTATION;  Surgeon: Newt Minion, MD;  Location: [place];  Service: Orthopedics;  Laterality: Left;   BREAST SURGERY     left lumpectomy    gastric bypass surgery      HARDWARE REMOVAL Left 08/28/2020   Procedure: HARDWARE REMOVAL;  Surgeon: Hiram Gash, MD;  Location: [facility];  Service: Orthopedics;  Laterality: Left;   hemorrhjoid surgery      IRRIGATION AND DEBRIDEMENT ABSCESS Left 09/11/2020   Procedure: Irrigation and debridement of deep abscess- left leg, septic joint;  Surgeon: Hiram Gash, MD;  Location: [facility];  Service: Orthopedics;  Laterality: Left;   KNEE ARTHROSCOPY WITH MEDIAL MENISECTOMY Left 08/28/2020   Procedure: KNEE ARTHROSCOPY WITH CHONDROPLASTY, MANIPULATION WITH LYSIS OF ADHESIONS, POSSIBLE MEDIAL MENISECTOMY;  Surgeon: Hiram Gash, MD;  Location: [facility];  Service: Orthopedics;  Laterality:  Left;   left femur fracture surgery      x 2   ORIF TIBIA PLATEAU Left 02/26/2020   Procedure: OPEN REDUCTION INTERNAL FIXATION (ORIF) TIBIAL PLATEAU;  Surgeon: Shona Needles, MD;  Location: [facility];  Service: Orthopedics;  Laterality: Left;   STUMP REVISION Left 11/29/2020   Procedure: REVISION LEFT ABOVE KNEE AMPUTATION with removal of deep hardware;  Surgeon: Newt Minion, MD;  Location: [place];  Service: Orthopedics;  Laterality: Left;   TUBAL LIGATION      There were no vitals filed for this visit.    Subjective Assessment - 02/13/21 0931     Subjective This 65yo was referred to PT by Meridee Score, MD with [phone] (ICD-10-CM) - 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[place]) performed on 10/18/2020 & revision 11/29/2020. She had Tibial Plateau Fx 11/98/2021 with ORIF hardward becoming infected. She recieved her prosthesis on 02/10/2021. Her mobility decline started with fall with femur fx Jan 2019. Her weight was down to ~150# after Bariatric surgery at that time.  She has gained >150# since Jan 2019.    Patient is accompained by: Family member   husband, [name]   Pertinent History HTN, obesity, bipolar disorder, ETOH use, arthritis, gastric bypass sg, Left femur ORIF Jan 2019, Covid 05/21/2020  Patient Stated Goals To be able use prosthesis to walk in community including outdoor festivals,  travel    Currently in Pain? Other (Comment)   phantom pain no known triggers               [facility] PT Assessment - 02/13/21 0927       Assessment   Medical Diagnosis Left Transfemoral Amputation    Referring Provider (PT) Meridee Score, MD    Onset Date/Surgical Date 02/10/21   prosthesis delivery   Hand Dominance Right      Precautions   Precautions Fall      Restrictions   Weight Bearing Restrictions No      Balance Screen   Has the patient fallen in the past 6 months Yes    How many times? 2   off balance, no injuries   Has the patient had a decrease in activity level  because of a fear of falling?  Yes    Is the patient reluctant to leave their home because of a fear of falling?  Yes      [place] Private residence    Living Arrangements Spouse/significant other   3 dogs (9#, 70#, 85#)   Type of [place] entrance;Stairs to enter    Entrance Stairs-Number of Steps 2   2 single steps   Entrance Stairs-Rails None    Home Layout One level    Home Equipment Wheelchair - manual;Walker - 4 wheels;Walker - 2 wheels;Cane - single point;Bedside commode;Tub bench;Grab bars - tub/shower;Hand held shower head      Prior Function   Level of Independence Independent;Independent with household mobility with device;Independent with community mobility with device   walker for community, cane or nothing in home   Vocation Retired    Leisure travel, festivals      Posture/Postural Control   Posture/Postural Control Postural limitations    Postural Limitations Rounded Shoulders;Forward head;Flexed trunk;Weight shift right      ROM / Strength   AROM / PROM / Strength AROM;Strength      AROM   Left Hip Extension -20   standing with RW wearing prosthesis     Strength   Overall Strength Deficits    Overall Strength Comments generalized deconditioning & weakness in trunk, RLE & left hip.      Transfers   Transfers Sit to Stand;Stand to Sit    Sit to Stand 5: Supervision;4: Min assist;With upper extremity assist;With armrests;From chair/3-in-1;From elevated surface;Other (comment)   to RW,  supervision from elevated surface & minA from std ht   Sit to Stand Details Verbal cues for safe use of DME/AE    Sit to Stand Details (indicate cue type and reason) handout on technique with TFA prosthesis - see pt instructions    Stand to Sit 5: Supervision;With upper extremity assist;With armrests;To chair/3-in-1;Uncontrolled descent;Other (comment)   from RW   Stand to Sit Details (indicate cue type and reason) Verbal cues for  safe use of DME/AE    Stand to Sit Details handout on technique with TFA prosthesis - see pt instructions      Ambulation/Gait   Ambulation/Gait Yes    Ambulation/Gait Assistance 4: Min assist    Ambulation/Gait Assistance Details excessive UE weight bearing on RW, prosthetic knee locked.    Ambulation Distance (Feet) 25 Feet    Assistive device Rolling walker;Prosthesis    Gait Pattern Step-to pattern;Decreased step length - right;Decreased stance  time - left;Decreased hip/knee flexion - left;Decreased weight shift to left;Left circumduction;Left hip hike;Antalgic;Lateral hip instability;Trunk flexed;Abducted - left;Wide base of support    Ambulation Surface Level;Indoor      Balance   Balance Assessed Yes      Static Standing Balance   Static Standing - Balance Support Bilateral upper extremity supported;During functional activity    Static Standing - Level of Assistance 5: Stand by assistance    Static Standing - Comment/# of Minutes 2 min with RW support & locked TFA prosthesis      Dynamic Standing Balance   Dynamic Standing - Balance Support Right upper extremity supported;During functional activity    Dynamic Standing - Level of Assistance 4: Min assist    Dynamic Standing - Comments reaching to left thigh to tighten suspension strap             Prosthetics Assessment - 02/13/21 0927       Prosthetics   Prosthetic Care Dependent with Skin check;Residual limb care;Care of non-amputated limb;Prosthetic cleaning;Ply sock cleaning;Correct ply sock adjustment;Proper wear schedule/adjustment;Proper weight-bearing schedule/adjustment    Donning prosthesis  Mod assist    Doffing prosthesis  Supervision    Current prosthetic wear tolerance (days/week)  daily since delivery 3 days prior to PT eval.    Current prosthetic wear tolerance (#hours/day)  prosthesis 1hr 2x/day,  liner most of awake hours for last 10 days (heat rash from too rapid increase)    Current prosthetic  weight-bearing tolerance (hours/day)  Pt tolerated standing with partial weight on prosthesis for 4 min without c/o limb discomfort    Edema pitting    Residual limb condition  multiple red circular spots with small openings which appears to heat rash + pt scratching limb with excessive sweating causing itch,  minimal hair growth,    Prosthesis Description silicon liner with velcro lanyard suspension,  ischial containment socket with flexible proximal brim,  current knee is loaner which is lock knee,  her knee is back ordered which is bariatric Total Knee that unlocks with toe pressure,  dynamic response foot                       Objective measurements completed on examination: See above findings.       [facility] Adult PT Treatment/Exercise - 02/13/21 0927       Prosthetics   Prosthetic Care Comments  wear liner & prosthesis 2 hrs 3x/day with off >/= 2hrs between wears.  Do NOT wear liner all day.  Use of antiperspirant on limb at night.  wear shrinker when not wearing prosthesis.    Education Provided Skin check;Residual limb care;Prosthetic cleaning;Correct ply sock adjustment;Proper Donning;Proper wear schedule/adjustment;Other (comment)   see prosthetic care comments   Person(s) Educated Patient;Spouse    Education Method Explanation;Demonstration;Tactile cues;Verbal cues;Handout    Education Method Verbalized understanding;Returned demonstration;Tactile cues required;Verbal cues required;Needs further instruction                       PT Short Term Goals - 02/13/21 1456       PT SHORT TERM GOAL #1   Title Patient demonstrates & verbalizes proper donning of prosthesis with supervision for balance.    Time 1    Period Months    Status New    Target Date 03/12/21      PT SHORT TERM GOAL #2   Title Patient tolerates wear of prosthesis >/= 9 hrs total / day without increase  in skin issues.    Time 1    Period Months    Status New    Target Date 03/12/21       PT SHORT TERM GOAL #3   Title Patient sit to/from stand std ht chair to RW controlling prosthetic knee with supervision.    Time 1    Period Months    Status New    Target Date 03/12/21      PT SHORT TERM GOAL #4   Title Patient ambulates 82' with RW & prosthesis with supervision.    Time 1    Period Months    Status New    Target Date 03/12/21               PT Long Term Goals - 02/13/21 1451       PT LONG TERM GOAL #1   Title Patient tolerates wear of prosthesis daily >80% of awake hours without skin or limb pain issues.    Time 3    Period Months    Status New    Target Date 05/14/21      PT LONG TERM GOAL #2   Title Patient verbalizes & demonstrates understanding of prosthetic care to enable safe utilization of prosthesis.    Time 3    Period Months    Status New    Target Date 05/14/21      PT LONG TERM GOAL #3   Title Patient ambulates >500' including some uneven terrain with RW & Transfemoral prosthesis modified independent.    Time 6    Period Months    Status New    Target Date 08/08/20      PT LONG TERM GOAL #4   Title Patient negotiates ramps & curbs with RW & transfemoral prosthesis modified independent.    Time 6    Period Months    Status New    Target Date 08/08/20      PT LONG TERM GOAL #5   Title [place] Balance task with RW support >/= 45/56    Time 6    Period Months    Status New    Target Date 08/08/20                    Plan - 02/13/21 1441     Clinical Impression Statement This 65yo female underwent a left Transfemoral Amputation on 10/18/2020 & revision 11/29/2020. She fractured her left femur Jan. 2019, then later her left tibial plateau. She has weakness & significant deconditioning with prolonged limited mobility. She gained over 150# during this time also. She received her first prosthesis on 02/10/2021 and is dependent in prosthetic care which increases the risk of skin issues. She already has heat rash with some  small skin breakdown present.  She has limited wear which impairs function throughout her day.  Dependency with sit to/from stand, static balance and dynamic balance indicates high fall risk and dependency in standing ADLs.  Her prosthetic gait has significant deviations including excessive UE weight bearing on walker and prosthetic knee is currently manual lock until her multiaxial knee comes in (her bariatric knee is on back order).  Patient would benefit from skilled PT intervention to improve function & safety with her prosthesis.    Personal Factors and Comorbidities Comorbidity 3+;Past/Current Experience;Time since onset of injury/illness/exacerbation    Comorbidities HTN, obesity, bipolar disorder, ETOH use, arthritis, gastric bypass sg, Left femur ORIF Jan 2019    Examination-Activity Limitations Lift;Locomotion Level;Reach Overhead;Stairs;Stand;Transfers  Examination-Participation Restrictions Community Activity;Occupation;Other   travel   Stability/Clinical Decision Making Evolving/Moderate complexity    Clinical Decision Making Moderate    Rehab Potential Good    PT Frequency 2x / week    PT Duration Other (comment)   6 months total, Medicare recert due in 90 days   PT Treatment/Interventions ADLs/Self Care Home Management;DME Instruction;Gait training;Stair training;Functional mobility training;Therapeutic activities;Therapeutic exercise;Balance training;Neuromuscular re-education;Patient/family education;Prosthetic Training;Manual techniques;Passive range of motion;Vestibular    PT Next Visit Plan review prosthetic care, HEP at sink, sit to/from stand transfers    Consulted and Agree with Plan of Care Patient;Family member/caregiver    Family Member Consulted husband, [name]             Patient will benefit from skilled therapeutic intervention in order to improve the following deficits and impairments:  Abnormal gait, Cardiopulmonary status limiting activity, Decreased  activity tolerance, Decreased balance, Decreased endurance, Decreased knowledge of use of DME, Decreased mobility, Decreased range of motion, Decreased skin integrity, Decreased strength, Increased edema, Postural dysfunction, Prosthetic Dependency, Obesity  Visit Diagnosis: Unsteadiness on feet  Other abnormalities of gait and mobility  History of falling  Abnormal posture  Muscle weakness (generalized)  Stiffness of left hip, not elsewhere classified     Problem List Patient Active Problem List   Diagnosis Date Noted   Pressure injury of skin 11/30/2020   Wound dehiscence 11/29/2020   Presence of retained hardware    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[place]) 11/06/2020   Knee osteomyelitis, left ([place]) 10/18/2020   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 ([facility])    Arthritis of left knee 09/20/2020   Septic arthritis of knee, left ([place]) 09/17/2020   Cellulitis and abscess of lower extremity 09/11/2020   Debility 05/02/2020   Urinary incontinence 05/02/2020   Pain in left shin    Urinary frequency    Sleep disturbance    Pain    Anxiety state    Urinary urgency    Drug induced constipation    Post-operative pain    Benign essential HTN    Pruritus    Left medial tibial plateau fracture 03/01/2020   Morbid obesity ([place])    Acute blood loss anemia    Postoperative pain    Tibial plateau fracture 02/25/2020   Tibial plateau fracture, left 02/25/2020   Hyponatremia 02/25/2020   Alcohol use disorder, moderate, dependence ([place]) 02/25/2020   Essential hypertension 02/25/2020    Jamey Reas, PT, DPT 02/13/2021, 3:00 PM  [facility] [address] [place], [place], [address] Phone: [phone]   Fax:  [phone]  Name: [name] MRN: [id number] Date of Birth: 10-Nov-1955

## 2021-02-17 ENCOUNTER — Ambulatory Visit (INDEPENDENT_AMBULATORY_CARE_PROVIDER_SITE_OTHER): Payer: BC Managed Care – PPO | Admitting: Physical Therapy

## 2021-02-17 ENCOUNTER — Other Ambulatory Visit: Payer: Self-pay

## 2021-02-17 ENCOUNTER — Encounter: Payer: Self-pay | Admitting: Physical Therapy

## 2021-02-17 DIAGNOSIS — Z9181 History of falling: Secondary | ICD-10-CM | POA: Diagnosis not present

## 2021-02-17 DIAGNOSIS — R2689 Other abnormalities of gait and mobility: Secondary | ICD-10-CM | POA: Diagnosis not present

## 2021-02-17 DIAGNOSIS — R2681 Unsteadiness on feet: Secondary | ICD-10-CM

## 2021-02-17 DIAGNOSIS — M6281 Muscle weakness (generalized): Secondary | ICD-10-CM

## 2021-02-17 DIAGNOSIS — M25652 Stiffness of left hip, not elsewhere classified: Secondary | ICD-10-CM

## 2021-02-17 DIAGNOSIS — R293 Abnormal posture: Secondary | ICD-10-CM | POA: Diagnosis not present

## 2021-02-17 NOTE — Therapy (Signed)
[facility] [address] [place], [place], [address] Phone: [phone]   Fax:  [phone]  Physical Therapy Treatment  Patient Details  Name: [name] MRN: [id number] Date of Birth: 1955/12/06 Referring Provider (PT): Meridee Score, MD   Encounter Date: 02/17/2021   PT End of Session - 02/17/21 1137     Visit Number 2    Number of Visits 18    Date for PT Re-Evaluation 05/14/21    Authorization Type BCBS primary & Medicare 2nd    Progress Note Due on Visit 10    PT Start Time 1137    PT Stop Time 1233    PT Time Calculation (min) 56 min    Activity Tolerance Patient tolerated treatment well;Patient limited by fatigue    Behavior During Therapy [facility] for tasks assessed/performed             Past Medical History:  Diagnosis Date   Anemia    Anxiety    Arthritis    Asthma    no longer treated   Bipolar disorder ([place])    off meds for 4-5 years   Depression    Hypertension     Past Surgical History:  Procedure Laterality Date   ABDOMINAL HYSTERECTOMY     AMPUTATION Left 10/18/2020   Procedure: LEFT ABOVE KNEE AMPUTATION;  Surgeon: Newt Minion, MD;  Location: [place];  Service: Orthopedics;  Laterality: Left;   BREAST SURGERY     left lumpectomy    gastric bypass surgery      HARDWARE REMOVAL Left 08/28/2020   Procedure: HARDWARE REMOVAL;  Surgeon: Hiram Gash, MD;  [facility];  Service: Orthopedics;  Laterality: Left;   hemorrhjoid surgery      IRRIGATION AND DEBRIDEMENT ABSCESS Left 09/11/2020   Procedure: Irrigation and debridement of deep abscess- left leg, septic joint;  Surgeon: Hiram Gash, MD;  [facility];  Service: Orthopedics;  Laterality: Left;   KNEE ARTHROSCOPY WITH MEDIAL MENISECTOMY Left 08/28/2020   Procedure: KNEE ARTHROSCOPY WITH CHONDROPLASTY, MANIPULATION WITH LYSIS OF ADHESIONS, POSSIBLE MEDIAL MENISECTOMY;  Surgeon: Hiram Gash, MD;  Location[facility];  Service: Orthopedics;  Laterality:  Left;   left femur fracture surgery      x 2   ORIF TIBIA PLATEAU Left 02/26/2020   Procedure: OPEN REDUCTION INTERNAL FIXATION (ORIF) TIBIAL PLATEAU;  Surgeon: Shona Needles, MD;  Location: [place];  Service: Orthopedics;  Laterality: Left;   STUMP REVISION Left 11/29/2020   Procedure: REVISION LEFT ABOVE KNEE AMPUTATION with removal of deep hardware;  Surgeon: Newt Minion, MD;  Location: [place];  Service: Orthopedics;  Laterality: Left;   TUBAL LIGATION      There were no vitals filed for this visit.   Subjective Assessment - 02/17/21 1137     Subjective She wore prosthesis 2hrs 2x/day without issues.  She has 2 shrinkers (one is really thin & one is thick). She can not get thicker one on her limb. She has yeast infection &/or heat rash between LEs & anterior hip.    Patient is accompained by: Family member   husband, Vessie Olmsted   Pertinent History HTN, obesity, bipolar disorder, ETOH use, arthritis, gastric bypass sg, Left femur ORIF Jan 2019, Covid 05/21/2020    Patient Stated Goals To be able use prosthesis to walk in community including outdoor festivals,  travel    Currently in Pain? No/denies  [place] Adult PT Treatment/Exercise - 02/17/21 1137       Transfers   Transfers Sit to Stand;Stand to [employer]    Sit to Stand 4: Min assist;4: Min guard;5: Supervision;With upper extremity assist;With armrests;From chair/3-in-1;Other (comment)   ro RW & sink, progress from MinA to min guard to supervision last time, from std 18" ht chair   Sit to Stand Details Verbal cues for safe use of DME/AE;Verbal cues for technique    Stand to Sit 5: Supervision;With upper extremity assist;With armrests;To chair/3-in-1;Other (comment)   RW & sink, from std 18" ht chair   Stand to Sit Details (indicate cue type and reason) Verbal cues for technique;Verbal cues for safe use of DME/AE      Ambulation/Gait   Ambulation/Gait Yes     Ambulation/Gait Assistance 4: Min assist    Ambulation/Gait Assistance Details demo proper step width & wt shift over prosthesis in stance.    Ambulation Distance (Feet) 20 Feet    Assistive device Rolling walker;Prosthesis    Ambulation Surface Level;Indoor      Self-Care   Self-Care ADL's    ADL's Toileting with TFA prosthesis PT demo & verbal cues on technique.   Sitting with towel roll under stomach in groin area & between legs to enable air into area.  Pt verbalized understanding.      Neuro Re-ed    Neuro Re-ed Details  standing at sink with UE support - wt shift right/midline/left but too painful in groin so limited,  ant with wt on toes / midfoot / post wt on heels, moving cones right/left across midline, reaching overhead and looking over shoulders.  PT demo, verbal & tactile cues for technique.. PT plans as HEP but not until prosthetist adjust proximal prosthetic socket.  Sitting at edge of chair with foot on floor without back or arm support.  Working on increased time tolerates without UE support.  Sitting without back or arm support - forward lean to upright, back lean controlled to back of chair to upright, trunk rotation rt/lt and sidebend rt/lt.  Pt to build tolerance over time.      Prosthetics   Prosthetic Care Comments  continue current wear 2hrs 3x/day.  PT sent message to prosthetist regarding socket fit.    Current prosthetic wear tolerance (days/week)  daily since delivery 02/10/2021    Current prosthetic wear tolerance (#hours/day)  prosthesis 2hrs 2-3x/day    Current prosthetic weight-bearing tolerance (hours/day)  Pt tolerated standing with partial weight on prosthesis for 4 min without c/o limb discomfort    Edema pitting    Residual limb condition  multiple red circular spots with small openings which appears to heat rash + pt scratching limb with excessive sweating causing itch,  minimal hair growth,    Education Provided Skin check;Residual limb care;Prosthetic  cleaning;Proper Donning;Proper wear schedule/adjustment;Other (comment)   see prosthetic care comments   Person(s) Educated Patient;Spouse    Education Method Explanation;Demonstration;Tactile cues;Verbal cues    Education Method Verbalized understanding;Tactile cues required;Verbal cues required;Needs further instruction    Donning Prosthesis Moderate assist                       PT Short Term Goals - 02/17/21 1312       PT SHORT TERM GOAL #1   Title Patient demonstrates & verbalizes proper donning of prosthesis with supervision for balance.    Time 1    Period Months    Status On-going  Target Date 03/12/21      PT SHORT TERM GOAL #2   Title Patient tolerates wear of prosthesis >/= 9 hrs total / day without increase in skin issues.    Time 1    Period Months    Status On-going    Target Date 03/12/21      PT SHORT TERM GOAL #3   Title Patient sit to/from stand std ht chair to RW controlling prosthetic knee with supervision.    Time 1    Period Months    Status On-going    Target Date 03/12/21      PT SHORT TERM GOAL #4   Title Patient ambulates 50' with RW & prosthesis with supervision.    Time 1    Period Months    Status On-going    Target Date 03/12/21               PT Long Term Goals - 02/17/21 1312       PT LONG TERM GOAL #1   Title Patient tolerates wear of prosthesis daily >80% of awake hours without skin or limb pain issues.    Time 3    Period Months    Status On-going    Target Date 05/14/21      PT LONG TERM GOAL #2   Title Patient verbalizes & demonstrates understanding of prosthetic care to enable safe utilization of prosthesis.    Time 3    Period Months    Status On-going    Target Date 05/14/21      PT LONG TERM GOAL #3   Title Patient ambulates >500' including some uneven terrain with RW & Transfemoral prosthesis modified independent.    Time 6    Period Months    Status On-going    Target Date 05/14/21      PT  LONG TERM GOAL #4   Title Patient negotiates ramps & curbs with RW & transfemoral prosthesis modified independent.    Time 6    Period Months    Status New    Target Date 05/14/21      PT LONG TERM GOAL #5   Title Berg Balance task with RW support >/= 45/56    Time 6    Period Months    Status On-going    Target Date 05/14/21                   Plan - 02/17/21 1137     Clinical Impression Statement PT worked on standing balance & tolerance at sink but was limited by pain from prosthesis so not ready as HEP.  Patient improved wt shift over prosthesis in gait but activity tolerance limiting her.  PT instructed in sitting without back support activities which she appears to understand but will take time to build up.    Personal Factors and Comorbidities Comorbidity 3+;Past/Current Experience;Time since onset of injury/illness/exacerbation    Comorbidities HTN, obesity, bipolar disorder, ETOH use, arthritis, gastric bypass sg, Left femur ORIF Jan 2019    Examination-Activity Limitations Lift;Locomotion Level;Reach Overhead;Stairs;Stand;Transfers    Examination-Participation Restrictions Community Activity;Occupation;Other   travel   Stability/Clinical Decision Making Evolving/Moderate complexity    Rehab Potential Good    PT Frequency 2x / week    PT Duration Other (comment)   6 months total, Medicare recert due in 90 days   PT Treatment/Interventions ADLs/Self Care Home Management;DME Instruction;Gait training;Stair training;Functional mobility training;Therapeutic activities;Therapeutic exercise;Balance training;Neuromuscular re-education;Patient/family education;Prosthetic Training;Manual techniques;Passive range of motion;Vestibular  PT Next Visit Plan review prosthetic care, HEP at sink, sit to/from stand transfers    Consulted and Agree with Plan of Care Patient;Family member/caregiver    Family Member Consulted husband, Yaritzel Stange             Patient will benefit  from skilled therapeutic intervention in order to improve the following deficits and impairments:  Abnormal gait, Cardiopulmonary status limiting activity, Decreased activity tolerance, Decreased balance, Decreased endurance, Decreased knowledge of use of DME, Decreased mobility, Decreased range of motion, Decreased skin integrity, Decreased strength, Increased edema, Postural dysfunction, Prosthetic Dependency, Obesity  Visit Diagnosis: Unsteadiness on feet  Other abnormalities of gait and mobility  History of falling  Abnormal posture  Muscle weakness (generalized)  Stiffness of left hip, not elsewhere classified     Problem List Patient Active Problem List   Diagnosis Date Noted   Pressure injury of skin 11/30/2020   Wound dehiscence 11/29/2020   Presence of retained hardware    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[place]) 11/06/2020   Knee osteomyelitis, left ([place]) 10/18/2020   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 ([facility])    Arthritis of left knee 09/20/2020   Septic arthritis of knee, left ([place]) 09/17/2020   Cellulitis and abscess of lower extremity 09/11/2020   Debility 05/02/2020   Urinary incontinence 05/02/2020   Pain in left shin    Urinary frequency    Sleep disturbance    Pain    Anxiety state    Urinary urgency    Drug induced constipation    Post-operative pain    Benign essential HTN    Pruritus    Left medial tibial plateau fracture 03/01/2020   Morbid obesity ([place])    Acute blood loss anemia    Postoperative pain    Tibial plateau fracture 02/25/2020   Tibial plateau fracture, left 02/25/2020   Hyponatremia 02/25/2020   Alcohol use disorder, moderate, dependence ([place]) 02/25/2020   Essential hypertension 02/25/2020    Jamey Reas, PT, DPT 02/17/2021, 1:19 PM  [facility] [address] [place], [place], [address] Phone: [phone]   Fax:  [phone]  Name: [name] MRN:  [id number] Date of Birth: November 07, 1955

## 2021-02-19 ENCOUNTER — Encounter: Payer: BC Managed Care – PPO | Admitting: Physical Therapy

## 2021-02-19 ENCOUNTER — Encounter: Payer: Self-pay | Admitting: Physical Therapy

## 2021-02-25 ENCOUNTER — Ambulatory Visit (INDEPENDENT_AMBULATORY_CARE_PROVIDER_SITE_OTHER): Payer: BC Managed Care – PPO | Admitting: Physical Therapy

## 2021-02-25 ENCOUNTER — Other Ambulatory Visit: Payer: Self-pay

## 2021-02-25 ENCOUNTER — Encounter: Payer: Self-pay | Admitting: Physical Therapy

## 2021-02-25 DIAGNOSIS — M6281 Muscle weakness (generalized): Secondary | ICD-10-CM

## 2021-02-25 DIAGNOSIS — R2681 Unsteadiness on feet: Secondary | ICD-10-CM | POA: Diagnosis not present

## 2021-02-25 DIAGNOSIS — R293 Abnormal posture: Secondary | ICD-10-CM | POA: Diagnosis not present

## 2021-02-25 DIAGNOSIS — R2689 Other abnormalities of gait and mobility: Secondary | ICD-10-CM | POA: Diagnosis not present

## 2021-02-25 DIAGNOSIS — Z9181 History of falling: Secondary | ICD-10-CM | POA: Diagnosis not present

## 2021-02-25 NOTE — Therapy (Signed)
[facility] [address] [place], [place], [address] Phone: [phone]   Fax:  [phone]  Physical Therapy Treatment  Patient Details  Name: [name] MRN: [id number] Date of Birth: 13-Dec-1955 Referring Provider (PT): Rachel Score, MD   Encounter Date: 02/25/2021   PT End of Session - 02/25/21 1024     Visit Number 3    Number of Visits 50    Date for PT Re-Evaluation 05/14/21    Authorization Type BCBS primary & Medicare 2nd    Progress Note Due on Visit 10    PT Start Time 1015    PT Stop Time 1100    PT Time Calculation (min) 45 min    Activity Tolerance Patient tolerated treatment well;Patient limited by fatigue    Behavior During Therapy [facility] for tasks assessed/performed             Past Medical History:  Diagnosis Date   Anemia    Anxiety    Arthritis    Asthma    no longer treated   Bipolar disorder ([place])    off meds for 4-5 years   Depression    Hypertension     Past Surgical History:  Procedure Laterality Date   ABDOMINAL HYSTERECTOMY     AMPUTATION Left 10/18/2020   Procedure: LEFT ABOVE KNEE AMPUTATION;  Surgeon: Newt Minion, MD;  Location: [place];  Service: Orthopedics;  Laterality: Left;   BREAST SURGERY     left lumpectomy    gastric bypass surgery      HARDWARE REMOVAL Left 08/28/2020   Procedure: HARDWARE REMOVAL;  Surgeon: Hiram Gash, MD;  [facility];  Service: Orthopedics;  Laterality: Left;   hemorrhjoid surgery      IRRIGATION AND DEBRIDEMENT ABSCESS Left 09/11/2020   Procedure: Irrigation and debridement of deep abscess- left leg, septic joint;  Surgeon: Hiram Gash, MD;  [facility];  Service: Orthopedics;  Laterality: Left;   KNEE ARTHROSCOPY WITH MEDIAL MENISECTOMY Left 08/28/2020   Procedure: KNEE ARTHROSCOPY WITH CHONDROPLASTY, MANIPULATION WITH LYSIS OF ADHESIONS, POSSIBLE MEDIAL MENISECTOMY;  Surgeon: Hiram Gash, MD;  [facility];  Service: Orthopedics;  Laterality: Left;    left femur fracture surgery      x 2   ORIF TIBIA PLATEAU Left 02/26/2020   Procedure: OPEN REDUCTION INTERNAL FIXATION (ORIF) TIBIAL PLATEAU;  Surgeon: Shona Needles, MD;  Location: [place];  Service: Orthopedics;  Laterality: Left;   STUMP REVISION Left 11/29/2020   Procedure: REVISION LEFT ABOVE KNEE AMPUTATION with removal of deep hardware;  Surgeon: Newt Minion, MD;  Location: [place];  Service: Orthopedics;  Laterality: Left;   TUBAL LIGATION      There were no vitals filed for this visit.   Subjective Assessment - 02/25/21 1015     Subjective She purchased new w/c.  She has been wearing prosthesis 1-2 hrs 2-3 x/day except one day when sick.  Limits wear because left hip gets uncomfortable sitting with prosthesis on limb and liner gets hot. [name], [facility] made changes to proximal socket brim and switched prosthetic knee to her knee.    Patient is accompained by: Family member   husband, [name]   Pertinent History HTN, obesity, bipolar disorder, ETOH use, arthritis, gastric bypass sg, Left femur ORIF Jan 2019, Covid 05/21/2020    Patient Stated Goals To be able use prosthesis to walk in community including outdoor festivals,  travel    Currently in Pain? No/denies  [place] Adult PT Treatment/Exercise - 02/25/21 1015       Transfers   Transfers Sit to Stand;Stand to [employer] Transfers    Sit to Stand 4: Min assist;4: Min guard;With upper extremity assist;With armrests;From chair/3-in-1;Other (comment)   from locked w/c   Sit to Stand Details Verbal cues for safe use of DME/AE;Verbal cues for technique    Stand to Sit 5: Supervision;With upper extremity assist;With armrests;To chair/3-in-1;Other (comment)   to locked w/c   Stand to Sit Details (indicate cue type and reason) Verbal cues for technique;Verbal cues for safe use of DME/AE      Ambulation/Gait   Ambulation/Gait Yes    Ambulation/Gait Assistance 4: Min assist     Ambulation/Gait Assistance Details PT added 1/4" lift to RLE & slip sock to ant Lt shoe to assist with prosthesis advancement while locked.  PT cued on left foot step width and wt shift over prosthesis in stance.    Ambulation Distance (Feet) 45 Feet   20' X 2 and 45'   Assistive device Rolling walker;Prosthesis    Ambulation Surface Level;Indoor      Self-Care   Self-Care --    ADL's --      Neuro Re-ed    Neuro Re-ed Details  --      Exercises   Exercises Other Exercises;Knee/Hip    Other Exercises  adductor squeezed with towel roll seated 10 reps 3 sets.  PT explained can do supine or semi-supine in recliner to change hip position. pt verbalized understanding.      Prosthetics   Prosthetic Care Comments  continue current wear 2hrs 3x/day.  PT cued on using pillow or folded towel under right buttocks/thigh to level pelvis when seated with prosthesis.    Current prosthetic wear tolerance (days/week)  daily since delivery 02/10/2021    Current prosthetic wear tolerance (#hours/day)  prosthesis 2hrs 2-3x/day    Current prosthetic weight-bearing tolerance (hours/day)  Pt tolerated standing with partial weight on prosthesis for 4 min without c/o limb discomfort    Edema pitting    Residual limb condition  multiple red circular spots with small openings which appears to heat rash + pt scratching limb with excessive sweating causing itch,  minimal hair growth,    Education Provided Proper Donning;Proper wear schedule/adjustment;Other (comment)   see prosthetic care comments   Person(s) Educated Patient;Spouse    Education Method Explanation;Verbal cues    Education Method Verbal cues required;Needs further instruction    Donning Prosthesis Minimal assist                       PT Short Term Goals - 02/17/21 1312       PT SHORT TERM GOAL #1   Title Patient demonstrates & verbalizes proper donning of prosthesis with supervision for balance.    Time 1    Period Months     Status On-going    Target Date 03/12/21      PT SHORT TERM GOAL #2   Title Patient tolerates wear of prosthesis >/= 9 hrs total / day without increase in skin issues.    Time 1    Period Months    Status On-going    Target Date 03/12/21      PT SHORT TERM GOAL #3   Title Patient sit to/from stand std ht chair to RW controlling prosthetic knee with supervision.    Time 1    Period Months    Status On-going  Target Date 03/12/21      PT SHORT TERM GOAL #4   Title Patient ambulates 63' with RW & prosthesis with supervision.    Time 1    Period Months    Status On-going    Target Date 03/12/21               PT Long Term Goals - 02/17/21 1312       PT LONG TERM GOAL #1   Title Patient tolerates wear of prosthesis daily >80% of awake hours without skin or limb pain issues.    Time 3    Period Months    Status On-going    Target Date 05/14/21      PT LONG TERM GOAL #2   Title Patient verbalizes & demonstrates understanding of prosthetic care to enable safe utilization of prosthesis.    Time 3    Period Months    Status On-going    Target Date 05/14/21      PT LONG TERM GOAL #3   Title Patient ambulates >500' including some uneven terrain with RW & Transfemoral prosthesis modified independent.    Time 6    Period Months    Status On-going    Target Date 05/14/21      PT LONG TERM GOAL #4   Title Patient negotiates ramps & curbs with RW & transfemoral prosthesis modified independent.    Time 6    Period Months    Status New    Target Date 05/14/21      PT LONG TERM GOAL #5   Title Berg Balance task with RW support >/= 45/56    Time 6    Period Months    Status On-going    Target Date 05/14/21                   Plan - 02/25/21 1024     Clinical Impression Statement PT instructed in balancing pelvis in sitting which should decrease hip pain.  PT did not increase wear yet as she is unable to wear 2hrs 3x/day.  She improved prosthetic gait with  distance and assist for third gait today.    Personal Factors and Comorbidities Comorbidity 3+;Past/Current Experience;Time since onset of injury/illness/exacerbation    Comorbidities HTN, obesity, bipolar disorder, ETOH use, arthritis, gastric bypass sg, Left femur ORIF Jan 2019    Examination-Activity Limitations Lift;Locomotion Level;Reach Overhead;Stairs;Stand;Transfers    Examination-Participation Restrictions Community Activity;Occupation;Other   travel   Stability/Clinical Decision Making Evolving/Moderate complexity    Rehab Potential Good    PT Frequency 2x / week    PT Duration Other (comment)   6 months total, Medicare recert due in 90 days   PT Treatment/Interventions ADLs/Self Care Home Management;DME Instruction;Gait training;Stair training;Functional mobility training;Therapeutic activities;Therapeutic exercise;Balance training;Neuromuscular re-education;Patient/family education;Prosthetic Training;Manual techniques;Passive range of motion;Vestibular    PT Next Visit Plan continue to educate on prosthetic care,  stand pivot transfers and gait between 2 set points.    Consulted and Agree with Plan of Care Patient;Family member/caregiver    Family Member Consulted husband, Naama [place]             Patient will benefit from skilled therapeutic intervention in order to improve the following deficits and impairments:  Abnormal gait, Cardiopulmonary status limiting activity, Decreased activity tolerance, Decreased balance, Decreased endurance, Decreased knowledge of use of DME, Decreased mobility, Decreased range of motion, Decreased skin integrity, Decreased strength, Increased edema, Postural dysfunction, Prosthetic Dependency, Obesity  Visit Diagnosis: Unsteadiness on  feet  Other abnormalities of gait and mobility  History of falling  Abnormal posture  Muscle weakness (generalized)     Problem List Patient Active Problem List   Diagnosis Date Noted   Pressure  injury of skin 11/30/2020   Wound dehiscence 11/29/2020   Presence of retained hardware    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[place]) 11/06/2020   Knee osteomyelitis, left ([place]) 10/18/2020   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 ([facility])    Arthritis of left knee 09/20/2020   Septic arthritis of knee, left ([place]) 09/17/2020   Cellulitis and abscess of lower extremity 09/11/2020   Debility 05/02/2020   Urinary incontinence 05/02/2020   Pain in left shin    Urinary frequency    Sleep disturbance    Pain    Anxiety state    Urinary urgency    Drug induced constipation    Post-operative pain    Benign essential HTN    Pruritus    Left medial tibial plateau fracture 03/01/2020   Morbid obesity ([place])    Acute blood loss anemia    Postoperative pain    Tibial plateau fracture 02/25/2020   Tibial plateau fracture, left 02/25/2020   Hyponatremia 02/25/2020   Alcohol use disorder, moderate, dependence ([place]) 02/25/2020   Essential hypertension 02/25/2020    Jamey Reas, PT, DPT 02/25/2021, 12:41 PM  [facility] [address] [place], [place], [address] Phone: [phone]   Fax:  [phone]  Name: [name] MRN: [id number] Date of Birth: 02-17-1956

## 2021-02-27 ENCOUNTER — Ambulatory Visit (INDEPENDENT_AMBULATORY_CARE_PROVIDER_SITE_OTHER): Payer: BC Managed Care – PPO | Admitting: Physical Therapy

## 2021-02-27 ENCOUNTER — Encounter: Payer: Self-pay | Admitting: Physical Therapy

## 2021-02-27 ENCOUNTER — Other Ambulatory Visit: Payer: Self-pay

## 2021-02-27 DIAGNOSIS — R293 Abnormal posture: Secondary | ICD-10-CM

## 2021-02-27 DIAGNOSIS — R2689 Other abnormalities of gait and mobility: Secondary | ICD-10-CM | POA: Diagnosis not present

## 2021-02-27 DIAGNOSIS — R2681 Unsteadiness on feet: Secondary | ICD-10-CM

## 2021-02-27 DIAGNOSIS — M6281 Muscle weakness (generalized): Secondary | ICD-10-CM

## 2021-02-27 DIAGNOSIS — Z9181 History of falling: Secondary | ICD-10-CM | POA: Diagnosis not present

## 2021-02-27 NOTE — Therapy (Signed)
[facility] [address] [place], [place], [address] Phone: [phone]   Fax:  [phone]  Physical Therapy Treatment  Patient Details  Name: [name] MRN: [id number] Date of Birth: 12/03/1955 Referring Provider (PT): Meridee Score, MD   Encounter Date: 02/27/2021   PT End of Session - 02/27/21 0927     Visit Number 4    Number of Visits 50    Date for PT Re-Evaluation 05/14/21    Authorization Type BCBS primary & Medicare 2nd    Progress Note Due on Visit 10    PT Start Time 0927    PT Stop Time 0950    PT Time Calculation (min) 23 min    Activity Tolerance Patient tolerated treatment well;Patient limited by fatigue    Behavior During Therapy [facility] for tasks assessed/performed             Past Medical History:  Diagnosis Date   Anemia    Anxiety    Arthritis    Asthma    no longer treated   Bipolar disorder ([place])    off meds for 4-5 years   Depression    Hypertension     Past Surgical History:  Procedure Laterality Date   ABDOMINAL HYSTERECTOMY     AMPUTATION Left 10/18/2020   Procedure: LEFT ABOVE KNEE AMPUTATION;  Surgeon: Newt Minion, MD;  Location: [place];  Service: Orthopedics;  Laterality: Left;   BREAST SURGERY     left lumpectomy    gastric bypass surgery      HARDWARE REMOVAL Left 08/28/2020   Procedure: HARDWARE REMOVAL;  Surgeon: Hiram Gash, MD;  Location: [facility];  Service: Orthopedics;  Laterality: Left;   hemorrhjoid surgery      IRRIGATION AND DEBRIDEMENT ABSCESS Left 09/11/2020   Procedure: Irrigation and debridement of deep abscess- left leg, septic joint;  Surgeon: Hiram Gash, MD;  Location: [facility];  Service: Orthopedics;  Laterality: Left;   KNEE ARTHROSCOPY WITH MEDIAL MENISECTOMY Left 08/28/2020   Procedure: KNEE ARTHROSCOPY WITH CHONDROPLASTY, MANIPULATION WITH LYSIS OF ADHESIONS, POSSIBLE MEDIAL MENISECTOMY;  Surgeon: Hiram Gash, MD;  Location: [facility];  Service: Orthopedics;  Laterality:  Left;   left femur fracture surgery      x 2   ORIF TIBIA PLATEAU Left 02/26/2020   Procedure: OPEN REDUCTION INTERNAL FIXATION (ORIF) TIBIAL PLATEAU;  Surgeon: Shona Needles, MD;  Location: [place];  Service: Orthopedics;  Laterality: Left;   STUMP REVISION Left 11/29/2020   Procedure: REVISION LEFT ABOVE KNEE AMPUTATION with removal of deep hardware;  Surgeon: Newt Minion, MD;  Location: [place];  Service: Orthopedics;  Laterality: Left;   TUBAL LIGATION      There were no vitals filed for this visit.   Subjective Assessment - 02/27/21 0928     Subjective She would like to be able to return to work but is concerned with fatigue, ability to attend PT & work with prosthesis.    Patient is accompained by: Family member   husband, [name]   Pertinent History HTN, obesity, bipolar disorder, ETOH use, arthritis, gastric bypass sg, Left femur ORIF Jan 2019, Covid 05/21/2020    Patient Stated Goals To be able use prosthesis to walk in community including outdoor festivals,  travel    Currently in Pain? No/denies  [place] Adult PT Treatment/Exercise - 02/27/21 0941       Transfers   Transfers Anterior-Posterior Transfer    Sit to Stand --    Anterior-Posterior Transfer 5: Supervision   verbal cues only, no safety issues   Anterior-Posterior Transfer Details (indicate cue type and reason) PT demo & instructed in toilet transfers without prosthesis.      Self-Care   ADL's Toileting with TFA prosthesis PT demo & verbal cues on technique.    Pt verbalized understanding.      Prosthetics   Prosthetic Care Comments  continue current wear 2hrs 3x/day.  PT cued on using pillow or folded towel under right buttocks/thigh to level pelvis when seated with prosthesis.    Current prosthetic wear tolerance (days/week)  daily since delivery 02/10/2021    Current prosthetic wear tolerance (#hours/day)  prosthesis 2hrs 2-3x/day                 [name] & I discussed her work duties & issues.  I am recommending that she return on progressive time.  She will need time to attend PT, work on her exercises & activities. I am recommending that she return on Nov. 28 for 2 hours in the afternoon 5 days /wk through 04/17/21. Then in January she work 4hrs in the afternoon 5 days /wk.  We will reassess at end of January how she is responding to activity level and where she is with her mobility.  She will need to work from home in her w/c.        PT Short Term Goals - 02/17/21 1312       PT SHORT TERM GOAL #1   Title Patient demonstrates & verbalizes proper donning of prosthesis with supervision for balance.    Time 1    Period Months    Status On-going    Target Date 03/12/21      PT SHORT TERM GOAL #2   Title Patient tolerates wear of prosthesis >/= 9 hrs total / day without increase in skin issues.    Time 1    Period Months    Status On-going    Target Date 03/12/21      PT SHORT TERM GOAL #3   Title Patient sit to/from stand std ht chair to RW controlling prosthetic knee with supervision.    Time 1    Period Months    Status On-going    Target Date 03/12/21      PT SHORT TERM GOAL #4   Title Patient ambulates 53' with RW & prosthesis with supervision.    Time 1    Period Months    Status On-going    Target Date 03/12/21               PT Long Term Goals - 02/17/21 1312       PT LONG TERM GOAL #1   Title Patient tolerates wear of prosthesis daily >80% of awake hours without skin or limb pain issues.    Time 3    Period Months    Status On-going    Target Date 05/14/21      PT LONG TERM GOAL #2   Title Patient verbalizes & demonstrates understanding of prosthetic care to enable safe utilization of prosthesis.    Time 3    Period Months    Status On-going    Target Date 05/14/21      PT LONG TERM GOAL #3   Title Patient ambulates >500'  including some uneven terrain with RW & Transfemoral  prosthesis modified independent.    Time 6    Period Months    Status On-going    Target Date 05/14/21      PT LONG TERM GOAL #4   Title Patient negotiates ramps & curbs with RW & transfemoral prosthesis modified independent.    Time 6    Period Months    Status New    Target Date 05/14/21      PT LONG TERM GOAL #5   Title Berg Balance task with RW support >/= 45/56    Time 6    Period Months    Status On-going    Target Date 05/14/21                   Plan - 02/27/21 1478     Clinical Impression Statement Patient had issue with incontinence and adult diaper was too wet to work on standing & gait.  PT used situation to educate on ant/post sliding transfers which she appears to understand. PT recommended toileting just prior to PT session to decrease chance of this situation in future.  PT discussed return to work and issues related.  see pt instructions.    Personal Factors and Comorbidities Comorbidity 3+;Past/Current Experience;Time since onset of injury/illness/exacerbation    Comorbidities HTN, obesity, bipolar disorder, ETOH use, arthritis, gastric bypass sg, Left femur ORIF Jan 2019    Examination-Activity Limitations Lift;Locomotion Level;Reach Overhead;Stairs;Stand;Transfers    Examination-Participation Restrictions Community Activity;Occupation;Other   travel   Stability/Clinical Decision Making Evolving/Moderate complexity    Rehab Potential Good    PT Frequency 2x / week    PT Duration Other (comment)   6 months total, Medicare recert due in 90 days   PT Treatment/Interventions ADLs/Self Care Home Management;DME Instruction;Gait training;Stair training;Functional mobility training;Therapeutic activities;Therapeutic exercise;Balance training;Neuromuscular re-education;Patient/family education;Prosthetic Training;Manual techniques;Passive range of motion;Vestibular    PT Next Visit Plan continue to educate on prosthetic care,  stand pivot transfers and gait between  2 set points.    Consulted and Agree with Plan of Care Patient;Family member/caregiver    Family Member Consulted husband, Derica Leiber             Patient will benefit from skilled therapeutic intervention in order to improve the following deficits and impairments:  Abnormal gait, Cardiopulmonary status limiting activity, Decreased activity tolerance, Decreased balance, Decreased endurance, Decreased knowledge of use of DME, Decreased mobility, Decreased range of motion, Decreased skin integrity, Decreased strength, Increased edema, Postural dysfunction, Prosthetic Dependency, Obesity  Visit Diagnosis: Unsteadiness on feet  Other abnormalities of gait and mobility  History of falling  Abnormal posture  Muscle weakness (generalized)     Problem List Patient Active Problem List   Diagnosis Date Noted   Pressure injury of skin 11/30/2020   Wound dehiscence 11/29/2020   Presence of retained hardware    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[place]) 11/06/2020   Knee osteomyelitis, left ([place]) 10/18/2020   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 ([facility])    Arthritis of left knee 09/20/2020   Septic arthritis of knee, left ([place]) 09/17/2020   Cellulitis and abscess of lower extremity 09/11/2020   Debility 05/02/2020   Urinary incontinence 05/02/2020   Pain in left shin    Urinary frequency    Sleep disturbance    Pain    Anxiety state    Urinary urgency    Drug induced constipation    Post-operative pain  Benign essential HTN    Pruritus    Left medial tibial plateau fracture 03/01/2020   Morbid obesity ([place])    Acute blood loss anemia    Postoperative pain    Tibial plateau fracture 02/25/2020   Tibial plateau fracture, left 02/25/2020   Hyponatremia 02/25/2020   Alcohol use disorder, moderate, dependence ([place]) 02/25/2020   Essential hypertension 02/25/2020    Jamey Reas, PT, DPT 02/27/2021, 12:23 PM  [facility] [address] [place], [place], [address] Phone: [phone]   Fax:  [phone]  Name: [name] MRN: [id number] Date of Birth: [birth year]

## 2021-02-27 NOTE — Patient Instructions (Signed)
Ms Kratt & I discussed her work duties & issues.  I am recommending that she return on progressive time.  She will need time to attend PT, work on her exercises & activities. I am recommending that she return on Nov. 28 for 2 hours in the afternoon 5 days /wk through 04/17/21. Then in January she work 4hrs in the afternoon 5 days /wk.  We will reassess at end of January how she is responding to activity level and where she is with her mobility.  She will need to work from home in her w/c.  Let me know if you have questions.

## 2021-03-03 ENCOUNTER — Other Ambulatory Visit: Payer: Self-pay

## 2021-03-03 ENCOUNTER — Encounter: Payer: Self-pay | Admitting: Physical Therapy

## 2021-03-03 ENCOUNTER — Ambulatory Visit (INDEPENDENT_AMBULATORY_CARE_PROVIDER_SITE_OTHER): Payer: BC Managed Care – PPO | Admitting: Physical Therapy

## 2021-03-03 DIAGNOSIS — R2681 Unsteadiness on feet: Secondary | ICD-10-CM

## 2021-03-03 DIAGNOSIS — Z9181 History of falling: Secondary | ICD-10-CM

## 2021-03-03 DIAGNOSIS — R293 Abnormal posture: Secondary | ICD-10-CM

## 2021-03-03 DIAGNOSIS — R2689 Other abnormalities of gait and mobility: Secondary | ICD-10-CM | POA: Diagnosis not present

## 2021-03-03 DIAGNOSIS — M6281 Muscle weakness (generalized): Secondary | ICD-10-CM

## 2021-03-03 NOTE — Therapy (Signed)
[facility] [address] [place], [place], [address] Phone: [phone]   Fax:  [phone]  Physical Therapy Treatment  Patient Details  Name: [name] MRN: [id number] Date of Birth: [birth year] Referring Provider (PT): Meridee Score, MD   Encounter Date: 03/03/2021   PT End of Session - 03/03/21 1051     Visit Number 5    Number of Visits 50    Date for PT Re-Evaluation 05/14/21    Authorization Type BCBS primary & Medicare 2nd    Progress Note Due on Visit 10    PT Start Time 1051    PT Stop Time 1141    PT Time Calculation (min) 50 min    Activity Tolerance Patient tolerated treatment well;Patient limited by fatigue    Behavior During Therapy [facility] for tasks assessed/performed             Past Medical History:  Diagnosis Date   Anemia    Anxiety    Arthritis    Asthma    no longer treated   Bipolar disorder ([place])    off meds for 4-5 years   Depression    Hypertension     Past Surgical History:  Procedure Laterality Date   ABDOMINAL HYSTERECTOMY     AMPUTATION Left 10/18/2020   Procedure: LEFT ABOVE KNEE AMPUTATION;  Surgeon: Newt Minion, MD;  Location: [place];  Service: Orthopedics;  Laterality: Left;   BREAST SURGERY     left lumpectomy    gastric bypass surgery      HARDWARE REMOVAL Left 08/28/2020   Procedure: HARDWARE REMOVAL;  Surgeon: Hiram Gash, MD;  [facility];  Service: Orthopedics;  Laterality: Left;   hemorrhjoid surgery      IRRIGATION AND DEBRIDEMENT ABSCESS Left 09/11/2020   Procedure: Irrigation and debridement of deep abscess- left leg, septic joint;  Surgeon: Hiram Gash, MD;  [facility];  Service: Orthopedics;  Laterality: Left;   KNEE ARTHROSCOPY WITH MEDIAL MENISECTOMY Left 08/28/2020   Procedure: KNEE ARTHROSCOPY WITH CHONDROPLASTY, MANIPULATION WITH LYSIS OF ADHESIONS, POSSIBLE MEDIAL MENISECTOMY;  Surgeon: Hiram Gash, MD;  [facility];  Service: Orthopedics;  Laterality:  Left;   left femur fracture surgery      x 2   ORIF TIBIA PLATEAU Left 02/26/2020   Procedure: OPEN REDUCTION INTERNAL FIXATION (ORIF) TIBIAL PLATEAU;  Surgeon: Shona Needles, MD;  Location: [place];  Service: Orthopedics;  Laterality: Left;   STUMP REVISION Left 11/29/2020   Procedure: REVISION LEFT ABOVE KNEE AMPUTATION with removal of deep hardware;  Surgeon: Newt Minion, MD;  Location: [place];  Service: Orthopedics;  Laterality: Left;   TUBAL LIGATION      There were no vitals filed for this visit.   Subjective Assessment - 03/03/21 1051     Subjective She wore prosthesis every day for 3hrs 2x/day. She stood up from medium height.    Patient is accompained by: Family member   husband, [name]   Pertinent History HTN, obesity, bipolar disorder, ETOH use, arthritis, gastric bypass sg, Left femur ORIF Jan 2019, Covid 05/21/2020    Patient Stated Goals To be able use prosthesis to walk in community including outdoor festivals,  travel    Currently in Pain? No/denies                               [facility] Adult PT Treatment/Exercise - 03/03/21 1052  Transfers   Transfers Sit to Stand;Stand to [employer] Transfers    Sit to Stand 5: Supervision;With upper extremity assist;With armrests;From chair/3-in-1;Other (comment)   from locked w/c   Sit to Stand Details Verbal cues for safe use of DME/AE;Verbal cues for technique    Stand to Sit 5: Supervision;With upper extremity assist;With armrests;To chair/3-in-1;Other (comment)   to locked w/c   Stand to Sit Details (indicate cue type and reason) Verbal cues for technique;Verbal cues for safe use of DME/AE    Stand Pivot Transfers 4: Min guard;5: Supervision   RW & locked TFA prosthesis     Ambulation/Gait   Ambulation/Gait Yes    Ambulation/Gait Assistance 4: Min guard;5: Supervision    Ambulation/Gait Assistance Details worked on ambulating bw w/c & std chair, initially with 90* then 180* turn to position  to sit.  PT instructed husband in how to safely assist. PT recommended contact initially on carpet & transition from tile to carpet.  pt & husband return demo understanding.    Ambulation Distance (Feet) 15 Feet   15' X 2 w/90* turn to sit & 10' X 2 with 180* turn to sit.   Assistive device Rolling walker;Prosthesis   locked TFA prosthesis     Exercises   Exercises --    Other Exercises  --      Prosthetics   Prosthetic Care Comments  increase wear to 4hrs 2x/day.    Current prosthetic wear tolerance (days/week)  daily since delivery 02/10/2021    Current prosthetic wear tolerance (#hours/day)  prosthesis 3hrs 2x/day    Current prosthetic weight-bearing tolerance (hours/day)  Pt tolerated standing with partial weight on prosthesis for 4 min without c/o limb discomfort    Edema pitting    Residual limb condition  multiple red circular spots with small openings which appears to heat rash + pt scratching limb with excessive sweating causing itch,  minimal hair growth,    Education Provided Proper Donning;Proper wear schedule/adjustment;Other (comment)   see prosthetic care comments   Person(s) Educated Patient;Spouse    Education Method Explanation;Verbal cues    Education Method Verbalized understanding;Verbal cues required;Needs further instruction    Donning Prosthesis Supervision                       PT Short Term Goals - 02/17/21 1312       PT SHORT TERM GOAL #1   Title Patient demonstrates & verbalizes proper donning of prosthesis with supervision for balance.    Time 1    Period Months    Status On-going    Target Date 03/12/21      PT SHORT TERM GOAL #2   Title Patient tolerates wear of prosthesis >/= 9 hrs total / day without increase in skin issues.    Time 1    Period Months    Status On-going    Target Date 03/12/21      PT SHORT TERM GOAL #3   Title Patient sit to/from stand std ht chair to RW controlling prosthetic knee with supervision.    Time 1     Period Months    Status On-going    Target Date 03/12/21      PT SHORT TERM GOAL #4   Title Patient ambulates 4' with RW & prosthesis with supervision.    Time 1    Period Months    Status On-going    Target Date 03/12/21  PT Long Term Goals - 02/17/21 1312       PT LONG TERM GOAL #1   Title Patient tolerates wear of prosthesis daily >80% of awake hours without skin or limb pain issues.    Time 3    Period Months    Status On-going    Target Date 05/14/21      PT LONG TERM GOAL #2   Title Patient verbalizes & demonstrates understanding of prosthetic care to enable safe utilization of prosthesis.    Time 3    Period Months    Status On-going    Target Date 05/14/21      PT LONG TERM GOAL #3   Title Patient ambulates >500' including some uneven terrain with RW & Transfemoral prosthesis modified independent.    Time 6    Period Months    Status On-going    Target Date 05/14/21      PT LONG TERM GOAL #4   Title Patient negotiates ramps & curbs with RW & transfemoral prosthesis modified independent.    Time 6    Period Months    Status New    Target Date 05/14/21      PT LONG TERM GOAL #5   Title Berg Balance task with RW support >/= 45/56    Time 6    Period Months    Status On-going    Target Date 05/14/21                   Plan - 03/03/21 1051     Clinical Impression Statement She improved her ability for sit/stand, stand pivot transfers & turning to position self to sit.  Her husband & pt appear to understand how to assist or gaurd for safety to enable ambulation at home.    Personal Factors and Comorbidities Comorbidity 3+;Past/Current Experience;Time since onset of injury/illness/exacerbation    Comorbidities HTN, obesity, bipolar disorder, ETOH use, arthritis, gastric bypass sg, Left femur ORIF Jan 2019    Examination-Activity Limitations Lift;Locomotion Level;Reach Overhead;Stairs;Stand;Transfers    Examination-Participation  Restrictions Community Activity;Occupation;Other   travel   Stability/Clinical Decision Making Evolving/Moderate complexity    Rehab Potential Good    PT Frequency 2x / week    PT Duration Other (comment)   6 months total, Medicare recert due in 90 days   PT Treatment/Interventions ADLs/Self Care Home Management;DME Instruction;Gait training;Stair training;Functional mobility training;Therapeutic activities;Therapeutic exercise;Balance training;Neuromuscular re-education;Patient/family education;Prosthetic Training;Manual techniques;Passive range of motion;Vestibular    PT Next Visit Plan work on prosthetic gait with RW, introduce curbs, instruct in stand pivot car transfer.    Consulted and Agree with Plan of Care Patient;Family member/caregiver    Family Member Consulted husband, [name]             Patient will benefit from skilled therapeutic intervention in order to improve the following deficits and impairments:  Abnormal gait, Cardiopulmonary status limiting activity, Decreased activity tolerance, Decreased balance, Decreased endurance, Decreased knowledge of use of DME, Decreased mobility, Decreased range of motion, Decreased skin integrity, Decreased strength, Increased edema, Postural dysfunction, Prosthetic Dependency, Obesity  Visit Diagnosis: Unsteadiness on feet  Other abnormalities of gait and mobility  History of falling  Abnormal posture  Muscle weakness (generalized)     Problem List Patient Active Problem List   Diagnosis Date Noted   Pressure injury of skin 11/30/2020   Wound dehiscence 11/29/2020   Presence of retained hardware    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[place]) 11/06/2020  Knee osteomyelitis, left ([place]) 10/18/2020   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 ([facility])    Arthritis of left knee 09/20/2020   Septic arthritis of knee, left ([place]) 09/17/2020   Cellulitis and abscess of lower extremity 09/11/2020    Debility 05/02/2020   Urinary incontinence 05/02/2020   Pain in left shin    Urinary frequency    Sleep disturbance    Pain    Anxiety state    Urinary urgency    Drug induced constipation    Post-operative pain    Benign essential HTN    Pruritus    Left medial tibial plateau fracture 03/01/2020   Morbid obesity ([place])    Acute blood loss anemia    Postoperative pain    Tibial plateau fracture 02/25/2020   Tibial plateau fracture, left 02/25/2020   Hyponatremia 02/25/2020   Alcohol use disorder, moderate, dependence ([place]) 02/25/2020   Essential hypertension 02/25/2020    Jamey Reas, PT, DPT 03/03/2021, 12:44 PM  [facility] [address] [place], [place], [address] Phone: [phone]   Fax:  [phone]  Name: [name] MRN: [id number] Date of Birth: 06-10-1955

## 2021-03-05 ENCOUNTER — Ambulatory Visit (INDEPENDENT_AMBULATORY_CARE_PROVIDER_SITE_OTHER): Payer: BC Managed Care – PPO | Admitting: Physical Therapy

## 2021-03-05 ENCOUNTER — Encounter: Payer: BC Managed Care – PPO | Admitting: Physical Therapy

## 2021-03-05 ENCOUNTER — Other Ambulatory Visit: Payer: Self-pay

## 2021-03-05 DIAGNOSIS — R2689 Other abnormalities of gait and mobility: Secondary | ICD-10-CM

## 2021-03-05 DIAGNOSIS — R293 Abnormal posture: Secondary | ICD-10-CM | POA: Diagnosis not present

## 2021-03-05 DIAGNOSIS — R2681 Unsteadiness on feet: Secondary | ICD-10-CM

## 2021-03-05 DIAGNOSIS — Z9181 History of falling: Secondary | ICD-10-CM | POA: Diagnosis not present

## 2021-03-05 DIAGNOSIS — M6281 Muscle weakness (generalized): Secondary | ICD-10-CM

## 2021-03-05 NOTE — Therapy (Addendum)
[facility] Physical Therapy [address] [place], [place], [address] Phone: [phone]   Fax:  [phone]  PHYSICAL THERAPY DISCHARGE SUMMARY (03/10/2021)  Visits from Start of Care: 6  Current functional level related to goals / functional outcomes: See below   Remaining deficits: See below   Education / Equipment: Prosthetic care   Patient agrees to discharge. Patient goals were not met. Patient is being discharged due to a change in medical status. Patient discharged due to hospitalization.  Please refer back to PT once she is discharged and medically stable.  [name], PT, DPT Physical Therapist Specializing in Prosthetic Rehab [facility]. [place], [place] [address] Phone [phone] FAX [phone]    Physical Therapy Treatment  Patient Details  Name: [name] MRN: [id number] Date of Birth: 1955/12/31 Referring Provider (PT): Rachel Score, MD   Encounter Date: 03/05/2021   PT End of Session - 03/05/21 0931     Visit Number 6    Number of Visits 50    Date for PT Re-Evaluation 05/14/21    Authorization Type BCBS primary & Medicare 2nd    Progress Note Due on Visit 10    PT Start Time 0845    PT Stop Time 0925    PT Time Calculation (min) 40 min    Activity Tolerance Patient tolerated treatment well;Patient limited by fatigue    Behavior During Therapy [facility] for tasks assessed/performed             Past Medical History:  Diagnosis Date   Anemia    Anxiety    Arthritis    Asthma    no longer treated   Bipolar disorder ([place])    off meds for 4-5 years   Depression    Hypertension     Past Surgical History:  Procedure Laterality Date   ABDOMINAL HYSTERECTOMY     AMPUTATION Left 10/18/2020   Procedure: LEFT ABOVE KNEE AMPUTATION;  Surgeon: Newt Minion, MD;  Location: [place];  Service: Orthopedics;  Laterality: Left;   BREAST SURGERY     left lumpectomy    gastric bypass  surgery      HARDWARE REMOVAL Left 08/28/2020   Procedure: HARDWARE REMOVAL;  Surgeon: Hiram Gash, MD;  Location: [facility];  Service: Orthopedics;  Laterality: Left;   hemorrhjoid surgery      IRRIGATION AND DEBRIDEMENT ABSCESS Left 09/11/2020   Procedure: Irrigation and debridement of deep abscess- left leg, septic joint;  Surgeon: Hiram Gash, MD;  Location: [facility];  Service: Orthopedics;  Laterality: Left;   KNEE ARTHROSCOPY WITH MEDIAL MENISECTOMY Left 08/28/2020   Procedure: KNEE ARTHROSCOPY WITH CHONDROPLASTY, MANIPULATION WITH LYSIS OF ADHESIONS, POSSIBLE MEDIAL MENISECTOMY;  Surgeon: Hiram Gash, MD;  Location: [facility];  Service: Orthopedics;  Laterality: Left;   left femur fracture surgery      x 2   ORIF TIBIA PLATEAU Left 02/26/2020   Procedure: OPEN REDUCTION INTERNAL FIXATION (ORIF) TIBIAL PLATEAU;  Surgeon: Shona Needles, MD;  Location: [place];  Service: Orthopedics;  Laterality: Left;   STUMP REVISION Left 11/29/2020   Procedure: REVISION LEFT ABOVE KNEE AMPUTATION with removal of deep hardware;  Surgeon: Newt Minion, MD;  Location: [place];  Service: Orthopedics;  Laterality: Left;   TUBAL LIGATION      There were no vitals filed for this visit.   Subjective Assessment - 03/05/21 0930     Subjective She continues to wear prosthesis  every day for 3hrs 2x/day. She did feel some pinching in her groin at times today from prosthesis    Patient is accompained by: Family member   husband, [name]   Pertinent History HTN, obesity, bipolar disorder, ETOH use, arthritis, gastric bypass sg, Left femur ORIF Jan 2019, Covid 05/21/2020    Patient Stated Goals To be able use prosthesis to walk in community including outdoor festivals,  travel                               [facility] Adult PT Treatment/Exercise - 03/05/21 0001       Transfers   Transfers Sit to Stand;Stand to [employer] Transfers    Sit to Stand 5: Supervision;With upper extremity assist;With  armrests;From chair/3-in-1;Other (comment)   from locked w/c   Sit to Stand Details Verbal cues for safe use of DME/AE;Verbal cues for technique    Stand to Sit 5: Supervision;With upper extremity assist;With armrests;To chair/3-in-1;Other (comment)   to locked w/c   Stand to Sit Details (indicate cue type and reason) Verbal cues for technique;Verbal cues for safe use of DME/AE    Stand Pivot Transfers 4: Min guard;5: Supervision   RW & locked TFA prosthesis     Ambulation/Gait   Ambulation/Gait Yes    Ambulation/Gait Assistance 4: Min guard;5: Supervision    Ambulation/Gait Assistance Details worked on 90 deg turns to sit into chair turning both directions    Ambulation Distance (Feet) 50 Feet   25 ft initally X2 then 50 for last attempt with 90 deg turn to chair   Assistive device Rolling walker;Prosthesis   locked TFA prosthesis   Curb 3: Mod assist   2 inch curb, she was fearful of turning around on curb, likely is ready for 4 inch next session     Knee/Hip Exercises: Machines for Strengthening   Cybex Leg Press Rt leg only (removed prosthesis due to pinching her groin) 75# 2X15, then 1X10 reps                       PT Short Term Goals - 02/17/21 1312       PT SHORT TERM GOAL #1   Title Patient demonstrates & verbalizes proper donning of prosthesis with supervision for balance.    Time 1    Period Months    Status On-going    Target Date 03/12/21      PT SHORT TERM GOAL #2   Title Patient tolerates wear of prosthesis >/= 9 hrs total / day without increase in skin issues.    Time 1    Period Months    Status On-going    Target Date 03/12/21      PT SHORT TERM GOAL #3   Title Patient sit to/from stand std ht chair to RW controlling prosthetic knee with supervision.    Time 1    Period Months    Status On-going    Target Date 03/12/21      PT SHORT TERM GOAL #4   Title Patient ambulates 43' with RW & prosthesis with supervision.    Time 1    Period Months     Status On-going    Target Date 03/12/21               PT Long Term Goals - 02/17/21 1312       PT LONG TERM GOAL #1  Title Patient tolerates wear of prosthesis daily >80% of awake hours without skin or limb pain issues.    Time 3    Period Months    Status On-going    Target Date 05/14/21      PT LONG TERM GOAL #2   Title Patient verbalizes & demonstrates understanding of prosthetic care to enable safe utilization of prosthesis.    Time 3    Period Months    Status On-going    Target Date 05/14/21      PT LONG TERM GOAL #3   Title Patient ambulates >500' including some uneven terrain with RW & Transfemoral prosthesis modified independent.    Time 6    Period Months    Status On-going    Target Date 05/14/21      PT LONG TERM GOAL #4   Title Patient negotiates ramps & curbs with RW & transfemoral prosthesis modified independent.    Time 6    Period Months    Status New    Target Date 05/14/21      PT LONG TERM GOAL #5   Title [place] Balance task with RW support >/= 45/56    Time 6    Period Months    Status On-going    Target Date 05/14/21                   Plan - 03/05/21 0932     Clinical Impression Statement Session focused on prosthethic gait and transfers. She was able to ambulate further today and progress to navigating 2 inch curb. She is likely ready to try 4 inch next time.    Personal Factors and Comorbidities Comorbidity 3+;Past/Current Experience;Time since onset of injury/illness/exacerbation    Comorbidities HTN, obesity, bipolar disorder, ETOH use, arthritis, gastric bypass sg, Left femur ORIF Jan 2019    Examination-Activity Limitations Lift;Locomotion Level;Reach Overhead;Stairs;Stand;Transfers    Examination-Participation Restrictions Community Activity;Occupation;Other   travel   Stability/Clinical Decision Making Evolving/Moderate complexity    Rehab Potential Good    PT Frequency 2x / week    PT Duration Other (comment)   6  months total, Medicare recert due in 90 days   PT Treatment/Interventions ADLs/Self Care Home Management;DME Instruction;Gait training;Stair training;Functional mobility training;Therapeutic activities;Therapeutic exercise;Balance training;Neuromuscular re-education;Patient/family education;Prosthetic Training;Manual techniques;Passive range of motion;Vestibular    PT Next Visit Plan work on prosthetic gait with RW, introduce curbs, instruct in stand pivot car transfer.    Consulted and Agree with Plan of Care Patient;Family member/caregiver    Family Member Consulted husband, Elona Yinger             Patient will benefit from skilled therapeutic intervention in order to improve the following deficits and impairments:  Abnormal gait, Cardiopulmonary status limiting activity, Decreased activity tolerance, Decreased balance, Decreased endurance, Decreased knowledge of use of DME, Decreased mobility, Decreased range of motion, Decreased skin integrity, Decreased strength, Increased edema, Postural dysfunction, Prosthetic Dependency, Obesity  Visit Diagnosis: Unsteadiness on feet  Other abnormalities of gait and mobility  History of falling  Abnormal posture  Muscle weakness (generalized)     Problem List Patient Active Problem List   Diagnosis Date Noted   Pressure injury of skin 11/30/2020   Wound dehiscence 11/29/2020   Presence of retained hardware    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[place]) 11/06/2020   Knee osteomyelitis, left ([place]) 10/18/2020   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 ([facility])    Arthritis of left knee 09/20/2020  Septic arthritis of knee, left ([place]) 09/17/2020   Cellulitis and abscess of lower extremity 09/11/2020   Debility 05/02/2020   Urinary incontinence 05/02/2020   Pain in left shin    Urinary frequency    Sleep disturbance    Pain    Anxiety state    Urinary urgency    Drug induced constipation    Post-operative  pain    Benign essential HTN    Pruritus    Left medial tibial plateau fracture 03/01/2020   Morbid obesity ([place])    Acute blood loss anemia    Postoperative pain    Tibial plateau fracture 02/25/2020   Tibial plateau fracture, left 02/25/2020   Hyponatremia 02/25/2020   Alcohol use disorder, moderate, dependence ([place]) 02/25/2020   Essential hypertension 02/25/2020    Debbe Odea, PT,DPT 03/05/2021, 9:33 AM  [facility] Physical Therapy [address] [place], [place], [address] Phone: [phone]   Fax:  [phone]  Name: [name] MRN: [id number] Date of Birth: 1955-08-25

## 2021-03-08 ENCOUNTER — Emergency Department (HOSPITAL_COMMUNITY)
Admission: EM | Admit: 2021-03-08 | Discharge: 2021-03-10 | Disposition: A | Discharge status: Other Acute Inpatient Hospital | Payer: BC Managed Care – PPO | Attending: Emergency Medicine | Admitting: Emergency Medicine

## 2021-03-08 ENCOUNTER — Other Ambulatory Visit: Payer: Self-pay

## 2021-03-08 ENCOUNTER — Encounter (HOSPITAL_COMMUNITY): Payer: Self-pay | Admitting: Licensed Clinical Social Worker

## 2021-03-08 DIAGNOSIS — Z79899 Other long term (current) drug therapy: Secondary | ICD-10-CM | POA: Insufficient documentation

## 2021-03-08 DIAGNOSIS — I10 Essential (primary) hypertension: Secondary | ICD-10-CM | POA: Diagnosis not present

## 2021-03-08 DIAGNOSIS — X58XXXA Exposure to other specified factors, initial encounter: Secondary | ICD-10-CM | POA: Insufficient documentation

## 2021-03-08 DIAGNOSIS — Y900 Blood alcohol level of less than 20 mg/100 ml: Secondary | ICD-10-CM | POA: Diagnosis not present

## 2021-03-08 DIAGNOSIS — X838XXA Intentional self-harm by other specified means, initial encounter: Secondary | ICD-10-CM | POA: Insufficient documentation

## 2021-03-08 DIAGNOSIS — T1491XA Suicide attempt, initial encounter: Secondary | ICD-10-CM | POA: Insufficient documentation

## 2021-03-08 DIAGNOSIS — F332 Major depressive disorder, recurrent severe without psychotic features: Secondary | ICD-10-CM | POA: Insufficient documentation

## 2021-03-08 DIAGNOSIS — T50902A Poisoning by unspecified drugs, medicaments and biological substances, intentional self-harm, initial encounter: Secondary | ICD-10-CM

## 2021-03-08 DIAGNOSIS — R45851 Suicidal ideations: Secondary | ICD-10-CM | POA: Insufficient documentation

## 2021-03-08 DIAGNOSIS — Z20822 Contact with and (suspected) exposure to covid-19: Secondary | ICD-10-CM | POA: Insufficient documentation

## 2021-03-08 DIAGNOSIS — T400X2A Poisoning by opium, intentional self-harm, initial encounter: Secondary | ICD-10-CM | POA: Insufficient documentation

## 2021-03-08 DIAGNOSIS — Z87891 Personal history of nicotine dependence: Secondary | ICD-10-CM | POA: Diagnosis not present

## 2021-03-08 DIAGNOSIS — J45909 Unspecified asthma, uncomplicated: Secondary | ICD-10-CM | POA: Insufficient documentation

## 2021-03-08 LAB — CBC WITH DIFFERENTIAL/PLATELET
Abs Immature Granulocytes: 0.03 10*3/uL (ref 0.00–0.07)
Basophils Absolute: 0 10*3/uL (ref 0.0–0.1)
Basophils Relative: 1 %
Eosinophils Absolute: 0.1 10*3/uL (ref 0.0–0.5)
Eosinophils Relative: 1 %
HCT: 36 % (ref 36.0–46.0)
Hemoglobin: 10.6 g/dL — ABNORMAL LOW (ref 12.0–15.0)
Immature Granulocytes: 0 %
Lymphocytes Relative: 15 %
Lymphs Abs: 1.3 10*3/uL (ref 0.7–4.0)
MCH: 22.8 pg — ABNORMAL LOW (ref 26.0–34.0)
MCHC: 29.4 g/dL — ABNORMAL LOW (ref 30.0–36.0)
MCV: 77.6 fL — ABNORMAL LOW (ref 80.0–100.0)
Monocytes Absolute: 0.5 10*3/uL (ref 0.1–1.0)
Monocytes Relative: 6 %
Neutro Abs: 6.7 10*3/uL (ref 1.7–7.7)
Neutrophils Relative %: 77 %
Platelets: 317 10*3/uL (ref 150–400)
RBC: 4.64 MIL/uL (ref 3.87–5.11)
RDW: 19.9 % — ABNORMAL HIGH (ref 11.5–15.5)
WBC: 8.7 10*3/uL (ref 4.0–10.5)
nRBC: 0 % (ref 0.0–0.2)

## 2021-03-08 LAB — COMPREHENSIVE METABOLIC PANEL
ALT: 54 U/L — ABNORMAL HIGH (ref 0–44)
AST: 60 U/L — ABNORMAL HIGH (ref 15–41)
Albumin: 3.3 g/dL — ABNORMAL LOW (ref 3.5–5.0)
Alkaline Phosphatase: 98 U/L (ref 38–126)
Anion gap: 6 (ref 5–15)
BUN: 11 mg/dL (ref 8–23)
CO2: 25 mmol/L (ref 22–32)
Calcium: 7.5 mg/dL — ABNORMAL LOW (ref 8.9–10.3)
Chloride: 107 mmol/L (ref 98–111)
Creatinine, Ser: 0.55 mg/dL (ref 0.44–1.00)
GFR, Estimated: >60 mL/min (ref >60)
Glucose, Bld: 92 mg/dL (ref 70–99)
Potassium: 5.2 mmol/L — ABNORMAL HIGH (ref 3.5–5.1)
Sodium: 138 mmol/L (ref 135–145)
Total Bilirubin: 0.9 mg/dL (ref 0.3–1.2)
Total Protein: 5.7 g/dL — ABNORMAL LOW (ref 6.5–8.1)

## 2021-03-08 LAB — ETHANOL: Alcohol, Ethyl (B): <10 mg/dL (ref <10)

## 2021-03-08 LAB — ACETAMINOPHEN LEVEL: Acetaminophen (Tylenol), Serum: <10 ug/mL — ABNORMAL LOW (ref 10–30)

## 2021-03-08 LAB — SALICYLATE LEVEL: Salicylate Lvl: <7 mg/dL — ABNORMAL LOW (ref 7.0–30.0)

## 2021-03-08 LAB — CBG MONITORING, ED: Glucose-Capillary: 102 mg/dL — ABNORMAL HIGH (ref 70–99)

## 2021-03-08 MED ORDER — CELECOXIB 200 MG PO CAPS
200.0000 mg | ORAL_CAPSULE | Freq: Every day | ORAL | Status: DC
  Administered 2021-03-09 – 2021-03-10 (×2): 200 mg via ORAL
  Filled 2021-03-08 (×3): qty 1

## 2021-03-08 MED ORDER — ARIPIPRAZOLE 5 MG PO TABS
5.0000 mg | ORAL_TABLET | Freq: Every day | ORAL | Status: DC
  Administered 2021-03-09 – 2021-03-10 (×2): 5 mg via ORAL
  Filled 2021-03-08 (×2): qty 1

## 2021-03-08 MED ORDER — ACETAMINOPHEN 500 MG PO TABS: 500.0000 mg | ORAL_TABLET | Freq: Four times a day (QID) | ORAL | Status: DC | PRN

## 2021-03-08 MED ORDER — PREGABALIN 50 MG PO CAPS
150.0000 mg | ORAL_CAPSULE | Freq: Three times a day (TID) | ORAL | Status: DC
  Administered 2021-03-08 – 2021-03-10 (×5): 150 mg via ORAL
  Filled 2021-03-08 (×5): qty 3

## 2021-03-08 MED ORDER — AMLODIPINE BESYLATE 5 MG PO TABS
5.0000 mg | ORAL_TABLET | Freq: Every morning | ORAL | Status: DC
  Administered 2021-03-09 – 2021-03-10 (×2): 5 mg via ORAL
  Filled 2021-03-08 (×2): qty 1

## 2021-03-08 MED ORDER — OXYCODONE HCL 5 MG PO TABS
10.0000 mg | ORAL_TABLET | ORAL | Status: DC | PRN
Start: 2021-03-08 — End: 2021-03-10

## 2021-03-08 MED ORDER — MIRABEGRON ER 25 MG PO TB24
25.0000 mg | ORAL_TABLET | Freq: Every morning | ORAL | Status: DC
  Administered 2021-03-09 – 2021-03-10 (×2): 25 mg via ORAL
  Filled 2021-03-08 (×2): qty 1

## 2021-03-08 MED ORDER — CYCLOBENZAPRINE HCL 10 MG PO TABS
10.0000 mg | ORAL_TABLET | Freq: Every day | ORAL | Status: DC
  Administered 2021-03-08 – 2021-03-09 (×2): 10 mg via ORAL
  Filled 2021-03-08 (×2): qty 1
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
## 2021-03-08 NOTE — Progress Notes (Signed)
CSW was contacted by [employer] in reference to the welfare check initiated. It was reported that the husband denied experiencing suicidal ideation during the welfare check.  Glennie Isle, MSW, LCSW-A, LCAS-A Phone: [phone] Disposition/TOC   .

## 2021-03-08 NOTE — Progress Notes (Signed)
CSW initiated a welfare check to the patient's residence at the request of TTS in reference to reported information from the patient that her and her husband were initial planning to attempt suicide together with a plan to overdose. CSW contacted [facility] non-emergency [phone].  Glennie Isle, MSW, [place], LCAS-A Phone: [phone] Disposition/TOC

## 2021-03-08 NOTE — ED Triage Notes (Signed)
Patient BIBA from home. Patient attempted suicide by overdosing on 10-30 oxycodone. Per EMS patient's husband had agreed to this action. Patient became unconscious and husband called EMS. Patient found unresponsive and given 2mg  narcan. O2 at 90% when found. Put on 2L.

## 2021-03-08 NOTE — ED Provider Notes (Signed)
[facility] DEPT Provider Note   CSN: [id number] Arrival date & time: 03/08/21  1238     History Chief Complaint  Patient presents with   Suicide Attempt   Drug Overdose    [name] is a 65 y.o. female.  HPI Patient presents after a suicide attempt.  She has a history of bipolar disorder, and left above-the-knee amputation about 2 years ago.  She notes ongoing discord with her husband, financial difficulties, and overwhelmed sensation.  Today, about 4 hours ago the patient took a handful of oxycodone.  She is prescribed this for chronic phantom pain left leg. She wrote a note to her husband explaining suicidal intent.  Patient was found unresponsive, received 2 mg of Narcan, was brought here for evaluation.  She denies new physical pain, expresses disappointment about having awakened.    Past Medical History:  Diagnosis Date   Anemia    Anxiety    Arthritis    Asthma    no longer treated   Bipolar disorder ([place])    off meds for 4-5 years   Depression    Hypertension     Patient Active Problem List   Diagnosis Date Noted   MDD (major depressive disorder), recurrent severe, without psychosis ([place])    Suicide attempt by adequate means ([place])    Suicidal ideation    Pressure injury of skin 11/30/2020   Wound dehiscence 11/29/2020   Presence of retained hardware    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[place]) 11/06/2020   Knee osteomyelitis, left ([place]) 10/18/2020   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 ([facility])    Arthritis of left knee 09/20/2020   Septic arthritis of knee, left ([place]) 09/17/2020   Cellulitis and abscess of lower extremity 09/11/2020   Debility 05/02/2020   Urinary incontinence 05/02/2020   Pain in left shin    Urinary frequency    Sleep disturbance    Pain    Anxiety state    Urinary urgency    Drug induced constipation    Post-operative pain    Benign essential HTN    Pruritus    Left  medial tibial plateau fracture 03/01/2020   Morbid obesity ([place])    Acute blood loss anemia    Postoperative pain    Tibial plateau fracture 02/25/2020   Tibial plateau fracture, left 02/25/2020   Hyponatremia 02/25/2020   Alcohol use disorder, moderate, dependence ([place]) 02/25/2020   Essential hypertension 02/25/2020    Past Surgical History:  Procedure Laterality Date   ABDOMINAL HYSTERECTOMY     AMPUTATION Left 10/18/2020   Procedure: LEFT ABOVE KNEE AMPUTATION;  Surgeon: Newt Minion, MD;  Location: [facility];  Service: Orthopedics;  Laterality: Left;   BREAST SURGERY     left lumpectomy    gastric bypass surgery      HARDWARE REMOVAL Left 08/28/2020   Procedure: HARDWARE REMOVAL;  Surgeon: Hiram Gash, MD;  Location: [facility];  Service: Orthopedics;  Laterality: Left;   hemorrhjoid surgery      IRRIGATION AND DEBRIDEMENT ABSCESS Left 09/11/2020   Procedure: Irrigation and debridement of deep abscess- left leg, septic joint;  Surgeon: Hiram Gash, MD;  Location[facility];  Service: Orthopedics;  Laterality: Left;   KNEE ARTHROSCOPY WITH MEDIAL MENISECTOMY Left 08/28/2020   Procedure: KNEE ARTHROSCOPY WITH CHONDROPLASTY, MANIPULATION WITH LYSIS OF ADHESIONS, POSSIBLE MEDIAL MENISECTOMY;  Surgeon: Hiram Gash, MD;  Location: [facility];  Service: Orthopedics;  Laterality: Left;  left femur fracture surgery      x 2   ORIF TIBIA PLATEAU Left 02/26/2020   Procedure: OPEN REDUCTION INTERNAL FIXATION (ORIF) TIBIAL PLATEAU;  Surgeon: Shona Needles, MD;  Location: [facility];  Service: Orthopedics;  Laterality: Left;   STUMP REVISION Left 11/29/2020   Procedure: REVISION LEFT ABOVE KNEE AMPUTATION with removal of deep hardware;  Surgeon: Newt Minion, MD;  Location: [facility];  Service: Orthopedics;  Laterality: Left;   TUBAL LIGATION       OB History   No obstetric history on file.     No family history on file.  Social History   Tobacco Use   Smoking status: Former    Types: Cigars     Quit date: 11/14/2020    Years since quitting: 0.3   Smokeless tobacco: Never  Vaping Use   Vaping Use: Former   Substances: Nicotine, Flavoring  Substance Use Topics   Alcohol use: Yes    Alcohol/week: 18.0 standard drinks    Types: 18 Cans of beer per week    Comment: 18 beers per week   Drug use: Never    Home Medications Prior to Admission medications   Medication Sig Start Date End Date Taking? Authorizing Provider  acetaminophen (TYLENOL) 500 MG tablet Take 500-1,000 mg by mouth every 6 (six) hours as needed (for headaches).   Yes [provider]  amLODipine (NORVASC) 5 MG tablet Take 5 mg by mouth in the morning. 11/19/20  Yes [provider]  ARIPiprazole (ABILIFY) 5 MG tablet Take 1 tablet (5 mg total) by mouth daily. 09/20/20  Yes McBane, Maylene Roes, PA-C  celecoxib (CELEBREX) 200 MG capsule Take 200 mg by mouth daily.   Yes [provider]  cetirizine (ZYRTEC) 10 MG tablet Take 10 mg by mouth daily as needed (for allergic reactions).   Yes [provider]  cyclobenzaprine (FLEXERIL) 10 MG tablet Take 1 tablet (10 mg total) by mouth 3 (three) times daily as needed for muscle spasms. Patient taking differently: Take 10 mg by mouth at bedtime. 09/19/20  Yes McBane, Maylene Roes, PA-C  diphenhydrAMINE (BENADRYL) 25 mg capsule Take 1 capsule (25 mg total) by mouth every 6 (six) hours as needed for itching (if pain meds cause itching). 10/23/20  Yes Persons, Bevely Palmer, PA  docusate sodium (COLACE) 100 MG capsule Take 100 mg by mouth daily as needed for mild constipation.   Yes [provider]  MYRBETRIQ 25 MG TB24 tablet Take 25 mg by mouth in the morning. 11/19/20  Yes [provider]  naloxone (NARCAN) nasal spray 4 mg/0.1 mL Place 1 spray into the nose as needed (accidental overdose). 11/22/20  Yes [provider]  nicotine (NICODERM CQ - DOSED IN MG/24 HOURS) 21 mg/24hr patch Place 21 mg onto the skin daily as needed (for smoking  cessation).   Yes [provider]  nystatin cream (MYCOSTATIN) Apply 1 application topically 3 (three) times daily as needed (irritation).   Yes [provider]  omeprazole (PRILOSEC) 20 MG capsule Take 20 mg by mouth daily as needed (for ingestion).   Yes [provider]  oxyCODONE 10 MG TABS Take 1-1.5 tablets (10-15 mg total) by mouth every 4 (four) hours as needed for severe pain (pain score 7-10). 12/02/20  Yes Persons, Bevely Palmer, PA  pregabalin (LYRICA) 150 MG capsule Take 150 mg by mouth 3 (three) times daily.   Yes [provider]  ALPRAZolam Duanne Moron) 0.5 MG tablet Take 0.5  mg by mouth every 8 (eight) hours as needed for anxiety. Patient not taking: Reported on 03/08/2021 11/13/20   [provider]  ascorbic acid (VITAMIN C) 1000 MG tablet Take 1 tablet (1,000 mg total) by mouth daily. Patient not taking: Reported on 03/08/2021 10/23/20   Persons, Bevely Palmer, [place]  Cholecalciferol (VITAMIN D3) 50 MCG (2000 UT) TABS Take by mouth in the morning. Patient not taking: Reported on 03/08/2021    [provider]  doxycycline (VIBRA-TABS) 100 MG tablet Take 1 tablet (100 mg total) by mouth 2 (two) times daily. Patient not taking: Reported on 03/08/2021 12/12/20   Newt Minion, MD  furosemide (LASIX) 20 MG tablet Take 20 mg by mouth daily as needed (fluid retention). Patient not taking: Reported on 03/08/2021    [provider]  gabapentin (NEURONTIN) 800 MG tablet Take 800 mg by mouth 3 (three) times daily. Patient not taking: Reported on 03/08/2021 02/10/21   [provider]  Multiple Vitamin (MULTIVITAMIN WITH MINERALS) TABS tablet Take 1 tablet by mouth in the morning. Centrum Multivitamin for Women Patient not taking: Reported on 03/08/2021    [provider]  Potassium Chloride ER 20 MEQ TBCR Take 20 mEq by mouth in the morning. Patient not taking: Reported on 03/08/2021 11/13/20   [provider]     Allergies    Morphine and related and Oxycodone  Review of Systems   Review of Systems  Constitutional:        Per HPI, otherwise negative  HENT:         Per HPI, otherwise negative  Respiratory:         Per HPI, otherwise negative  Cardiovascular:        Per HPI, otherwise negative  Gastrointestinal:  Negative for vomiting.  Endocrine:       Negative aside from HPI  Genitourinary:        Neg aside from HPI   Musculoskeletal:        Per HPI, otherwise negative  Skin: Negative.   Neurological:  Negative for syncope.  Psychiatric/Behavioral:  Positive for suicidal ideas. The patient is nervous/anxious.    Physical Exam Updated Vital Signs BP (!) 144/74 (BP Location: Left Arm)   Pulse 74   Temp 98.1 F (36.7 C) (Oral)   Resp 16   SpO2 97%   Physical Exam Vitals and nursing note reviewed.  Constitutional:      General: She is not in acute distress.    Appearance: She is well-developed. She is obese.  HENT:     Head: Normocephalic and atraumatic.  Eyes:     Conjunctiva/sclera: Conjunctivae normal.  Cardiovascular:     Rate and Rhythm: Normal rate and regular rhythm.  Pulmonary:     Effort: Pulmonary effort is normal. No respiratory distress.     Breath sounds: Normal breath sounds. No stridor.  Abdominal:     General: There is no distension.  Musculoskeletal:     Comments: Left above-the-knee potation unremarkable stump  Skin:    General: Skin is warm and dry.  Neurological:     Mental Status: She is alert and oriented to person, place, and time.     Cranial Nerves: No cranial nerve deficit.  Psychiatric:        Thought Content: Thought content includes suicidal ideation. Thought content includes suicidal plan.    ED Results / Procedures / Treatments   Labs (all labs ordered are listed, but only abnormal results are displayed) Labs  Reviewed  COMPREHENSIVE METABOLIC PANEL - Abnormal; Notable for the following components:      Result Value   Potassium  5.2 (*)    Calcium 7.5 (*)    Total Protein 5.7 (*)    Albumin 3.3 (*)    AST 60 (*)    ALT 54 (*)    All other components within normal limits  SALICYLATE LEVEL - Abnormal; Notable for the following components:   Salicylate Lvl <1.0 (*)    All other components within normal limits  ACETAMINOPHEN LEVEL - Abnormal; Notable for the following components:   Acetaminophen (Tylenol), Serum <10 (*)    All other components within normal limits  CBC WITH DIFFERENTIAL/PLATELET - Abnormal; Notable for the following components:   Hemoglobin 10.6 (*)    MCV 77.6 (*)    MCH 22.8 (*)    MCHC 29.4 (*)    RDW 19.9 (*)    All other components within normal limits  CBG MONITORING, ED - Abnormal; Notable for the following components:   Glucose-Capillary 102 (*)    All other components within normal limits  ETHANOL  RAPID URINE DRUG SCREEN, HOSP PERFORMED  CBC WITH DIFFERENTIAL/PLATELET    EKG EKG Interpretation  Date/Time:  Saturday March 08 2021 15:08:41 EST Ventricular Rate:  81 PR Interval:  136 QRS Duration: 88 QT Interval:  374 QTC Calculation: 434 R Axis:   -9 Text Interpretation: Normal sinus rhythm Low voltage QRS Borderline ECG Confirmed by Carmin Muskrat [phone]) on 03/08/2021 10:56:06 PM    Procedures Procedures   Medications Ordered in ED Medications - No data to display  ED Course  I have reviewed the triage vital signs and the nursing notes.  Pertinent labs & imaging results that were available during my care of the patient were reviewed by me and considered in my medical decision making (see chart for details).  With reported overdose, plan for suicide, patient was placed on continuous monitoring, psych consulted. Cardiac monitor 70s sinus normal Pulse ox 100% room air normal Update: Patient somnolent, awakens easily, has had no respiratory complication thus far.  Update:, No respiratory depression.  Update: Patient interactive, continues to voice  disappointment about lack of success. She has been seen and evaluated by our behavioral health team and I have discussed her case with them.  Recommendation is for behavioral admission.  Patient is amenable to therapy, admission, though she continues to note that she sees no way out of her current situation. MDM Rules/Calculators/A&P MDM Number of Diagnoses or Management Options Intentional overdose, initial encounter [facility]): new, needed workup   Amount and/or Complexity of Data Reviewed Clinical lab tests: ordered and reviewed Tests in the medicine section of CPT: reviewed and ordered Decide to obtain previous medical records or to obtain history from someone other than the patient: yes Obtain history from someone other than the patient: yes Review and summarize past medical records: yes Discuss the patient with other providers: yes Independent visualization of images, tracings, or specimens: yes  Risk of Complications, Morbidity, and/or Mortality Presenting problems: high Diagnostic procedures: high Management options: high  Critical Care Total time providing critical care: 30-74 minutes (40)  Patient Progress Patient progress: stable   Final Clinical Impression(s) / ED Diagnoses Final diagnoses:  Intentional overdose, initial encounter [facility])       Carmin Muskrat, MD 03/08/21 2257

## 2021-03-09 LAB — RAPID URINE DRUG SCREEN, HOSP PERFORMED
Amphetamines: NOT DETECTED
Barbiturates: NOT DETECTED
Benzodiazepines: NOT DETECTED
Cocaine: NOT DETECTED
Opiates: POSITIVE — AB
Tetrahydrocannabinol: NOT DETECTED

## 2021-03-09 LAB — RESP PANEL BY RT-PCR (FLU A&B, COVID) ARPGX2
Influenza A by PCR: NEGATIVE
Influenza B by PCR: NEGATIVE
SARS Coronavirus 2 by RT PCR: NEGATIVE

## 2021-03-09 LAB — CBG MONITORING, ED: Glucose-Capillary: 106 mg/dL — ABNORMAL HIGH (ref 70–99)

## 2021-03-09 NOTE — Progress Notes (Signed)
Per Hazeline Junker, patient meets criteria for inpatient treatment. There are no available or appropriate beds at [facility] today. CSW faxed referrals to the following facilities for review:  [facility]  Pending - No Request Sent N/A [address], [place] [address] [phone] --  [place] N/A [address], [place] [address] [phone] --  [facility]  Pending - No Request Sent N/A [address] [place] [address] [phone] --  [facility]  Pending - No Request Sent N/A [address] [place] [address] [phone] --  [facility]  Pending - No Request Sent N/A [address] [place], [place] [address] [phone] --  [facility]  Pending - No Request Sent N/A [address] [place] [address] [place] --  [facility]  Pending - No Request Sent N/A [address] [place] [address] [facility]  Pending - No Request Sent N/A [address] [place] [address] [phone] --  [facility]  Pending - No Request Sent N/A [address] [place] [address] [phone] --  [facility]  Pending - No Request Sent N/A [address] [place] [address] [phone] --  [facility]  Pending - No Request Sent N/A [place], [place] [address] [place] --  [facility]  Pending - No Request Sent N/A [address] [place]am[place]ina Monks [place] [address] [phone] --  [facility]  Pending - No Request Sent N/A [address], [place] [address] [phone] --  [facility]  Pending - No Request Sent N/A [address] [place] [address] [phone] --  [facility]  Pending - No Request Sent N/A [address] [place] [address] [phone] --  [facility]  Pending - No Request Sent N/A [place], [place] [address] [phone] --    TTS will continue to seek bed placement.  Glennie Isle, MSW, [place], LCAS-A Phone: [phone] Disposition/TOC

## 2021-03-09 NOTE — ED Provider Notes (Signed)
Emergency Medicine Observation Re-evaluation Note  Liara Holm is a 65 y.o. female, seen on rounds today.  Pt initially presented to the ED for complaints of Suicide Attempt and Drug Overdose Currently, the patient is asleep.  Physical Exam  BP (!) 119/54 (BP Location: Left Arm)   Pulse 81   Temp 98.1 F (36.7 C) (Oral)   Resp 16   SpO2 96%  Physical Exam General: asleep Cardiac: deferred, asleep Lungs: normal effort Psych: deferred, asleep  ED Course / MDM  EKG:EKG Interpretation  Date/Time:  Saturday March 08 2021 15:08:41 EST Ventricular Rate:  81 PR Interval:  136 QRS Duration: 88 QT Interval:  374 QTC Calculation: 434 R Axis:   -9 Text Interpretation: Normal sinus rhythm Low voltage QRS Borderline ECG Confirmed by Carmin Muskrat [phone]) on 03/08/2021 10:56:06 PM  I have reviewed the labs performed to date as well as medications administered while in observation.  Recent changes in the last 24 hours include her home meds being ordered.  Plan  Current plan is for inpatient psychiatric placement.  Gurbani Figge is not under involuntary commitment.     Sherwood Gambler, MD 03/09/21 [phone]

## 2021-03-09 NOTE — Progress Notes (Signed)
Per Bri, admissions, pt has been accepted to [facility] unit. Accepting provider is Dr. Gardenia Phlegm. Patient can arrive 03/10/2021 in the morning. Number for report is [phone].   Glennie Isle, MSW, LCSW-A Phone: [phone] Disposition/TOC

## 2021-03-10 ENCOUNTER — Encounter: Payer: BC Managed Care – PPO | Admitting: Physical Therapy

## 2021-03-10 ENCOUNTER — Encounter (HOSPITAL_COMMUNITY): Payer: Self-pay

## 2021-03-10 NOTE — ED Provider Notes (Signed)
Emergency Medicine Observation Re-evaluation Note  [name] is a 65 y.o. female, seen on rounds today.  Pt initially presented to the ED for complaints of Suicide Attempt and Drug Overdose Currently, the patient is sitting in a chair eating.  Physical Exam  BP 134/72   Pulse 66   Temp 98.1 F (36.7 C) (Oral)   Resp 18   SpO2 99%  Physical Exam General: resting comfortably, NAD Lungs: normal WOB Psych: currently calm and resting  ED Course / MDM  EKG:EKG Interpretation  Date/Time:  Saturday March 08 2021 15:08:41 EST Ventricular Rate:  81 PR Interval:  136 QRS Duration: 88 QT Interval:  374 QTC Calculation: 434 R Axis:   -9 Text Interpretation: Normal sinus rhythm Low voltage QRS Borderline ECG Confirmed by Carmin Muskrat [phone]) on 03/08/2021 10:56:06 PM  I have reviewed the labs performed to date as well as medications administered while in observation.  Recent changes in the last 24 hours include none.  Plan  Current plan is for inpatient psychiatric treatment. Patient is requesting to leave, husband is here in the lobby wanting to take her home, she is refusing transfer to facility. Patient placed under IVC for suicide attempt. [name] is involuntary commitment.      Lorelle Gibbs, DO 03/10/21 [phone]

## 2021-03-10 NOTE — ED Notes (Signed)
Pt taken to bathroom in recliner. Pt was able to pivot to toilet with assistance. Pt's depend changed.

## 2021-03-10 NOTE — ED Notes (Signed)
Pt stating she does not want to go to Inpatient psych. Pt states she wants to go home.  ED RN contacted social worker Christina C.  Margreta Journey states she will reach out to pych and get back to ED RN about plan.

## 2021-03-10 NOTE — BH Assessment (Signed)
[facility] Assessment Progress Note   Pt has been accepted to [facility] unit by Dr Gardenia Phlegm.  For further details please see noted authored by [employer], [place].  Pt presents at [facility] voluntarily, but refuses to be transferred.  EDP Lavenia Atlas, DO finds that pt meets criteria for IVC, which she has initiated.  IVC documents have been faxed to [facility], and at [employer] confirms receipt.  She has since sent Findings and Custody Order to this Probation officer and off duty [employer] officer has filled out Return of Service.  Pt's nurse, Museum/gallery conservator, has been notified.  She agrees to call nursing report, as well as [facility] to facilitate transport.  Jalene Mullet, [place] Coordinator [phone]

## 2021-03-12 ENCOUNTER — Encounter: Payer: BC Managed Care – PPO | Admitting: Physical Therapy

## 2021-03-17 ENCOUNTER — Encounter: Payer: BC Managed Care – PPO | Admitting: Physical Therapy

## 2021-03-18 ENCOUNTER — Telehealth (HOSPITAL_COMMUNITY): Payer: Self-pay | Admitting: Professional

## 2021-03-19 ENCOUNTER — Encounter: Payer: BC Managed Care – PPO | Admitting: Physical Therapy

## 2021-03-19 ENCOUNTER — Other Ambulatory Visit: Payer: Self-pay | Admitting: Family

## 2021-03-19 DIAGNOSIS — S78112A Complete traumatic amputation at level between left hip and knee, initial encounter: Secondary | ICD-10-CM

## 2021-03-24 ENCOUNTER — Encounter: Payer: BC Managed Care – PPO | Admitting: Physical Therapy

## 2021-03-24 ENCOUNTER — Other Ambulatory Visit: Payer: Self-pay

## 2021-03-24 ENCOUNTER — Ambulatory Visit (INDEPENDENT_AMBULATORY_CARE_PROVIDER_SITE_OTHER): Payer: BC Managed Care – PPO | Admitting: Physical Therapy

## 2021-03-24 ENCOUNTER — Encounter: Payer: Self-pay | Admitting: Physical Therapy

## 2021-03-24 DIAGNOSIS — M25662 Stiffness of left knee, not elsewhere classified: Secondary | ICD-10-CM

## 2021-03-24 DIAGNOSIS — R600 Localized edema: Secondary | ICD-10-CM

## 2021-03-24 DIAGNOSIS — R2689 Other abnormalities of gait and mobility: Secondary | ICD-10-CM

## 2021-03-24 DIAGNOSIS — M25652 Stiffness of left hip, not elsewhere classified: Secondary | ICD-10-CM

## 2021-03-24 DIAGNOSIS — R293 Abnormal posture: Secondary | ICD-10-CM | POA: Diagnosis not present

## 2021-03-24 DIAGNOSIS — M25562 Pain in left knee: Secondary | ICD-10-CM

## 2021-03-24 DIAGNOSIS — M6281 Muscle weakness (generalized): Secondary | ICD-10-CM

## 2021-03-24 DIAGNOSIS — Z9181 History of falling: Secondary | ICD-10-CM

## 2021-03-24 DIAGNOSIS — R2681 Unsteadiness on feet: Secondary | ICD-10-CM

## 2021-03-24 NOTE — Therapy (Signed)
[facility] [address] [place], [place], [address] Phone: [phone]   Fax:  [phone]  Physical Therapy Evaluation  Patient Details  Name: [name] MRN: [id number] Date of Birth: 01/30/1956 Referring Provider (PT): Meridee Score, MD   Encounter Date: 03/24/2021   PT End of Session - 03/24/21 1255     Visit Number 1    Number of Visits 83    Date for PT Re-Evaluation 06/22/21    Authorization Type BCBS primary & Medicare 2nd    Progress Note Due on Visit 10    PT Start Time 1100    PT Stop Time 1144    PT Time Calculation (min) 44 min    Activity Tolerance Patient tolerated treatment well;Patient limited by fatigue    Behavior During Therapy [facility] for tasks assessed/performed             Past Medical History:  Diagnosis Date   Anemia    Anxiety    Arthritis    Asthma    no longer treated   Bipolar disorder ([place])    off meds for 4-5 years   Depression    Hypertension     Past Surgical History:  Procedure Laterality Date   ABDOMINAL HYSTERECTOMY     AMPUTATION Left 10/18/2020   Procedure: LEFT ABOVE KNEE AMPUTATION;  Surgeon: Newt Minion, MD;  Location: [place];  Service: Orthopedics;  Laterality: Left;   BREAST SURGERY     left lumpectomy    gastric bypass surgery      HARDWARE REMOVAL Left 08/28/2020   Procedure: HARDWARE REMOVAL;  Surgeon: Hiram Gash, MD;  Location: [facility];  Service: Orthopedics;  Laterality: Left;   hemorrhjoid surgery      IRRIGATION AND DEBRIDEMENT ABSCESS Left 09/11/2020   Procedure: Irrigation and debridement of deep abscess- left leg, septic joint;  Surgeon: Hiram Gash, MD;  Location: [facility];  Service: Orthopedics;  Laterality: Left;   KNEE ARTHROSCOPY WITH MEDIAL MENISECTOMY Left 08/28/2020   Procedure: KNEE ARTHROSCOPY WITH CHONDROPLASTY, MANIPULATION WITH LYSIS OF ADHESIONS, POSSIBLE MEDIAL MENISECTOMY;  Surgeon: Hiram Gash, MD;  Location: [facility];  Service: Orthopedics;  Laterality:  Left;   left femur fracture surgery      x 2   ORIF TIBIA PLATEAU Left 02/26/2020   Procedure: OPEN REDUCTION INTERNAL FIXATION (ORIF) TIBIAL PLATEAU;  Surgeon: Shona Needles, MD;  Location: [place];  Service: Orthopedics;  Laterality: Left;   STUMP REVISION Left 11/29/2020   Procedure: REVISION LEFT ABOVE KNEE AMPUTATION with removal of deep hardware;  Surgeon: Newt Minion, MD;  Location: [place];  Service: Orthopedics;  Laterality: Left;   TUBAL LIGATION      There were no vitals filed for this visit.    Subjective Assessment - 03/24/21 1100     Subjective This 65yo was referred to PT by Meridee Score, MD with [phone] (ICD-10-CM) - 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[place]) performed on 10/18/2020 & revision 11/29/2020. She had Tibial Plateau Fx 11/98/2021 with ORIF hardward becoming infected. She recieved her prosthesis on 02/10/2021. 100-150# wt gain since Jan 2019  She was admitted 03/08/2021 with intential OD suicide attempt for 10 days with discharge 03/16/2021. 6 PT visits 02/13/2021-03/05/2021 for prosthetic care.    Patient is accompained by: Family member   husband, Brooks Stotz   Pertinent History HTN, obesity, bipolar disorder, ETOH use, arthritis, gastric bypass sg, Left femur ORIF Jan 2019, Covid 05/21/2020    Patient  Stated Goals To be able use prosthesis to walk in community including outdoor festivals,  travel    Currently in Pain? No/denies                [facility] PT Assessment - 03/24/21 1100       Assessment   Medical Diagnosis Left Transfemoral Amputation    Referring Provider (PT) Meridee Score, MD    Onset Date/Surgical Date 02/10/21   prosthesis delivery   Hand Dominance Right      Precautions   Precautions [place]      Restrictions   Weight Bearing Restrictions No      [place] residence    Living Arrangements Spouse/significant other   3 dogs (9#, 70#, 85#)   Type of [place]  entrance;Stairs to enter    Entrance Stairs-Number of Steps 2   2 single steps   Entrance Stairs-Rails None    Home Layout One level    Home Equipment Wheelchair - manual;Walker - 4 wheels;Walker - 2 wheels;Cane - single point;Bedside commode;Tub bench;Grab bars - tub/shower;Hand held shower head      Prior Function   Level of Independence Independent;Independent with household mobility with device;Independent with community mobility with device   walker for community, cane or nothing in home   Vocation Retired    Leisure travel, festivals      Posture/Postural Control   Posture/Postural Control Postural limitations    Postural Limitations Rounded Shoulders;Forward head;Flexed trunk;Weight shift right      AROM   Left Hip Extension -20   standing with RW wearing prosthesis     Strength   Overall Strength Deficits    Overall Strength Comments generalized deconditioning & weakness in trunk, RLE & left hip.      Transfers   Transfers Sit to Stand;Stand to Sit    Sit to Stand 5: Supervision;4: Min assist;With upper extremity assist;With armrests;From chair/3-in-1;From elevated surface;Other (comment)   to RW,  supervision from elevated surface & minA from std ht   Sit to Stand Details Verbal cues for safe use of DME/AE    Stand to Sit 5: Supervision;With upper extremity assist;With armrests;To chair/3-in-1;Uncontrolled descent;Other (comment)   from RW   Stand to Sit Details (indicate cue type and reason) Verbal cues for safe use of DME/AE      Ambulation/Gait   Ambulation/Gait Yes    Ambulation/Gait Assistance 4: Min guard    Ambulation/Gait Assistance Details excessive UE weight bearing on RW    Ambulation Distance (Feet) 32 Feet    Assistive device Rolling walker;Prosthesis    Gait Pattern Step-to pattern;Decreased step length - right;Decreased stance time - left;Decreased hip/knee flexion - left;Decreased weight shift to left;Left circumduction;Left hip hike;Antalgic;Lateral hip  instability;Trunk flexed;Abducted - left;Wide base of support      Balance   Balance Assessed Yes      Static Standing Balance   Static Standing - Balance Support Bilateral upper extremity supported;During functional activity    Static Standing - Level of Assistance 5: Stand by assistance      Dynamic Standing Balance   Dynamic Standing - Balance Support Right upper extremity supported;During functional activity    Dynamic Standing - Level of Assistance 5: Stand by assistance    Dynamic Standing - Comments reaching from RW handle to just anterior to RW (within length of her arm)             Prosthetics Assessment -  03/24/21 1100       Prosthetics   Prosthetic Care Dependent with Skin check;Residual limb care;Care of non-amputated limb;Prosthetic cleaning;Correct ply sock adjustment;Ply sock cleaning;Proper wear schedule/adjustment;Proper weight-bearing schedule/adjustment    Donning prosthesis  Min assist    Doffing prosthesis  Supervision    Current prosthetic wear tolerance (days/week)  daily since hospital d/c 03/18/2021.    Current prosthetic wear tolerance (#hours/day)  liner 8 hrs 1x/day and prosthesis intermittently when needs to move.    Current prosthetic weight-bearing tolerance (hours/day)  Pt tolerated standing with partial weight on prosthesis for 4 min without c/o limb discomfort but groin pressure which is probably due to limb too deep in socket  (needs more ply socks)    Edema pitting                       Objective measurements completed on examination: See above findings.       [facility] Adult PT Treatment/Exercise - 03/24/21 1100       Ambulation/Gait   Gait Comments PT verbal cues on walking program - short distances with goal to increase frequency; long - her max tolerable distance 1-2 x/day and medium (distance bw short & long 4-6x/day).  pt verbalized understanding.  PT also recommended sitting on all household furniture where she would sit.   If she needs assist to arise only sit on those pieces when her husband is available.  Have w/c handy so can do scooting or squat pivot transfer if unable to stand without assist.  pt verbalized understanding.      Self-Care   Self-Care Other Self-Care Comments    Other Self-Care Comments  Pt unable to drive with prosthesis on her limb due to size will not fit under steering wheel.  PT demo & verbal cues for removing prosthesis, placing RW & prosthesis in passenger side of car then removing.  pt & husband verbalized understanding with plans to try at home.      Prosthetics   Prosthetic Care Comments  PT & pt reviewed rationale and goal to progress prosthesis wear to all awake hours. Wear 2x/day enables greater total wear time with less skin issues from heat & less tenderness from pressure.  PT recommending liner & prostheis 4hrs 2x/day.  PT recommended appt with prosthetist for pads in socket.    Education Provided Skin check;Residual limb care;Proper Donning;Proper wear schedule/adjustment;Other (comment)   see prosthetic care comments   Person(s) Educated Patient;Spouse    Education Method Explanation;Verbal cues    Education Method Verbalized understanding;Verbal cues required;Needs further instruction                       PT Short Term Goals - 03/24/21 1258       PT SHORT TERM GOAL #1   Title Patient demonstrates & verbalizes proper donning of prosthesis with supervision for balance.    Time 1    Period Months    Status New    Target Date 04/10/21      PT SHORT TERM GOAL #2   Title Patient tolerates wear of prosthesis >/= 9 hrs total / day without increase in skin issues.    Time 1    Period Months    Status New    Target Date 04/10/21      PT SHORT TERM GOAL #3   Title Patient sit to/from stand std ht chair to RW controlling prosthetic knee with supervision.    Time  1    Period Months    Status New    Target Date 04/10/21      PT SHORT TERM GOAL #4   Title  Patient ambulates 58' with RW & prosthesis with supervision.    Time 1    Period Months    Status New    Target Date 04/10/21               PT Long Term Goals - 03/24/21 1259       PT LONG TERM GOAL #1   Title Patient tolerates wear of prosthesis daily >80% of awake hours without skin or limb pain issues.    Time 3    Period Months    Status New    Target Date 06/22/21      PT LONG TERM GOAL #2   Title Patient verbalizes & demonstrates understanding of prosthetic care to enable safe utilization of prosthesis.    Time 3    Period Months    Status New    Target Date 06/22/21      PT LONG TERM GOAL #3   Title Patient ambulates >500' including some uneven terrain with RW & Transfemoral prosthesis modified independent.    Time 6    Period Months    Status New    Target Date 06/22/21      PT LONG TERM GOAL #4   Title Patient negotiates ramps & curbs with RW & transfemoral prosthesis modified independent.    Time 6    Period Months    Status New    Target Date 06/22/21      PT LONG TERM GOAL #5   Title [place] Balance task with RW support >/= 45/56    Time 6    Period Months    Status New    Target Date 06/22/21                    Plan - 03/24/21 1642     Clinical Impression Statement Patient was discharged due to medical change with hospitalization.She was referred back to PT as she is medically stable now.  PT evaluated this patient today.  She is dependent in prosthetic care. She has limited wear of prosthesis limiting function & safety.  She requires assist and skilled instructions for sit to/from stand transfers from standard height chairs.  Her standing balance is impaired requiring assist & BUE support which impairs standing ADLs and places her at risk of falls.  Her gait requires assitance, excessive UE weight bearing on walker and limited distances to 61' which limits her mobility.  Patient has significant deconditioning from prolonged w/c dependency  along with hip flexor contractions impairing standing posture. She will need skilled PT to progress her mobility & safety with prosthetic use.    Personal Factors and Comorbidities Comorbidity 3+;Past/Current Experience;Time since onset of injury/illness/exacerbation    Comorbidities HTN, obesity, bipolar disorder, ETOH use, arthritis, gastric bypass sg, Left femur ORIF Jan 2019    Examination-Activity Limitations Lift;Locomotion Level;Reach Overhead;Stairs;Stand;Transfers    Examination-Participation Restrictions Community Activity;Occupation;Other   travel   Stability/Clinical Decision Making Evolving/Moderate complexity    Clinical Decision Making Moderate    Rehab Potential Good    PT Frequency 2x / week    PT Duration Other (comment)   6 months total, Medicare recert due in 90 days   PT Treatment/Interventions ADLs/Self Care Home Management;DME Instruction;Gait training;Stair training;Functional mobility training;Therapeutic activities;Therapeutic exercise;Balance training;Neuromuscular re-education;Patient/family education;Prosthetic Training;Manual techniques;Passive range of motion;Vestibular  PT Next Visit Plan work on prosthetic gait with RW including curbs,  work on sit to/from stand, therapeutic exercise for strength, flexibility & endurance.    Consulted and Agree with Plan of Care Patient;Family member/caregiver    Family Member Consulted husband, Francetta Ilg             Patient will benefit from skilled therapeutic intervention in order to improve the following deficits and impairments:  Abnormal gait, Cardiopulmonary status limiting activity, Decreased activity tolerance, Decreased balance, Decreased endurance, Decreased knowledge of use of DME, Decreased mobility, Decreased range of motion, Decreased skin integrity, Decreased strength, Increased edema, Postural dysfunction, Prosthetic Dependency, Obesity  Visit Diagnosis: Unsteadiness on feet  Other abnormalities of  gait and mobility  History of falling  Abnormal posture  Muscle weakness (generalized)  Stiffness of left hip, not elsewhere classified  Stiffness of left knee, not elsewhere classified  Acute pain of left knee  Localized edema     Problem List Patient Active Problem List   Diagnosis Date Noted   MDD (major depressive disorder), recurrent severe, without psychosis ([place])    Suicide attempt by adequate means ([place])    Suicidal ideation    Pressure injury of skin 11/30/2020   Wound dehiscence 11/29/2020   Presence of retained hardware    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[place]) 11/06/2020   Knee osteomyelitis, left ([place]) 10/18/2020   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 ([facility])    Arthritis of left knee 09/20/2020   Septic arthritis of knee, left ([place]) 09/17/2020   Cellulitis and abscess of lower extremity 09/11/2020   Debility 05/02/2020   Urinary incontinence 05/02/2020   Pain in left shin    Urinary frequency    Sleep disturbance    Pain    Anxiety state    Urinary urgency    Drug induced constipation    Post-operative pain    Benign essential HTN    Pruritus    Left medial tibial plateau fracture 03/01/2020   Morbid obesity ([place])    Acute blood loss anemia    Postoperative pain    Tibial plateau fracture 02/25/2020   Tibial plateau fracture, left 02/25/2020   Hyponatremia 02/25/2020   Alcohol use disorder, moderate, dependence ([place]) 02/25/2020   Essential hypertension 02/25/2020    Jamey Reas, PT, DPT 03/24/2021, 4:50 PM  [facility] [address] [place], [place], [address] Phone: [phone]   Fax:  [phone]  Name: [name] MRN: [id number] Date of Birth: [birth year]

## 2021-03-25 ENCOUNTER — Ambulatory Visit (HOSPITAL_COMMUNITY): Payer: BC Managed Care – PPO | Admitting: Licensed Clinical Social Worker

## 2021-03-26 ENCOUNTER — Encounter: Payer: BC Managed Care – PPO | Admitting: Physical Therapy

## 2021-03-31 ENCOUNTER — Encounter: Payer: BC Managed Care – PPO | Admitting: Physical Therapy

## 2021-04-02 ENCOUNTER — Encounter: Payer: Self-pay | Admitting: Physical Therapy

## 2021-04-02 ENCOUNTER — Encounter: Payer: BC Managed Care – PPO | Admitting: Physical Therapy

## 2021-04-07 ENCOUNTER — Other Ambulatory Visit: Payer: Self-pay

## 2021-04-07 ENCOUNTER — Encounter: Payer: Self-pay | Admitting: Physical Therapy

## 2021-04-07 ENCOUNTER — Encounter: Payer: BC Managed Care – PPO | Admitting: Physical Therapy

## 2021-04-07 ENCOUNTER — Ambulatory Visit (INDEPENDENT_AMBULATORY_CARE_PROVIDER_SITE_OTHER): Payer: BC Managed Care – PPO | Admitting: Physical Therapy

## 2021-04-07 DIAGNOSIS — R2681 Unsteadiness on feet: Secondary | ICD-10-CM | POA: Diagnosis not present

## 2021-04-07 DIAGNOSIS — R293 Abnormal posture: Secondary | ICD-10-CM | POA: Diagnosis not present

## 2021-04-07 DIAGNOSIS — M25662 Stiffness of left knee, not elsewhere classified: Secondary | ICD-10-CM

## 2021-04-07 DIAGNOSIS — M25652 Stiffness of left hip, not elsewhere classified: Secondary | ICD-10-CM

## 2021-04-07 DIAGNOSIS — R2689 Other abnormalities of gait and mobility: Secondary | ICD-10-CM | POA: Diagnosis not present

## 2021-04-07 DIAGNOSIS — Z9181 History of falling: Secondary | ICD-10-CM

## 2021-04-07 DIAGNOSIS — M6281 Muscle weakness (generalized): Secondary | ICD-10-CM

## 2021-04-07 NOTE — Therapy (Signed)
[facility] [address] [place], [place], [address] Phone: [phone]   Fax:  [phone]  Physical Therapy Treatment  Patient Details  Name: [name] MRN: [id number] Date of Birth: 1955-10-15 Referring Provider (PT): Meridee Score, MD   Encounter Date: 04/07/2021   PT End of Session - 04/07/21 1059     Visit Number 2    Number of Visits 50    Date for PT Re-Evaluation 06/22/21    Authorization Type BCBS primary & Medicare 2nd    Progress Note Due on Visit 10    PT Start Time 1059    PT Stop Time 1145    PT Time Calculation (min) 46 min    Activity Tolerance Patient tolerated treatment well;Patient limited by fatigue    Behavior During Therapy [facility] for tasks assessed/performed             Past Medical History:  Diagnosis Date   Anemia    Anxiety    Arthritis    Asthma    no longer treated   Bipolar disorder ([place])    off meds for 4-5 years   Depression    Hypertension     Past Surgical History:  Procedure Laterality Date   ABDOMINAL HYSTERECTOMY     AMPUTATION Left 10/18/2020   Procedure: LEFT ABOVE KNEE AMPUTATION;  Surgeon: Newt Minion, MD;  Location: [place];  Service: Orthopedics;  Laterality: Left;   BREAST SURGERY     left lumpectomy    gastric bypass surgery      HARDWARE REMOVAL Left 08/28/2020   Procedure: HARDWARE REMOVAL;  Surgeon: Hiram Gash, MD;  [facility];  Service: Orthopedics;  Laterality: Left;   hemorrhjoid surgery      IRRIGATION AND DEBRIDEMENT ABSCESS Left 09/11/2020   Procedure: Irrigation and debridement of deep abscess- left leg, septic joint;  Surgeon: Hiram Gash, MD;  [facility];  Service: Orthopedics;  Laterality: Left;   KNEE ARTHROSCOPY WITH MEDIAL MENISECTOMY Left 08/28/2020   Procedure: KNEE ARTHROSCOPY WITH CHONDROPLASTY, MANIPULATION WITH LYSIS OF ADHESIONS, POSSIBLE MEDIAL MENISECTOMY;  Surgeon: Hiram Gash, MD;  [facility];  Service: Orthopedics;  Laterality:  Left;   left femur fracture surgery      x 2   ORIF TIBIA PLATEAU Left 02/26/2020   Procedure: OPEN REDUCTION INTERNAL FIXATION (ORIF) TIBIAL PLATEAU;  Surgeon: Shona Needles, MD;  Location: [place];  Service: Orthopedics;  Laterality: Left;   STUMP REVISION Left 11/29/2020   Procedure: REVISION LEFT ABOVE KNEE AMPUTATION with removal of deep hardware;  Surgeon: Newt Minion, MD;  Location: [place];  Service: Orthopedics;  Laterality: Left;   TUBAL LIGATION      There were no vitals filed for this visit.   Subjective Assessment - 04/07/21 1059     Subjective She is wearing prosthesis 5-6 hours 1x/day with some marks. She is using antiperspriant as PT asked.  She uses w/c to get out of her house then able to use RW to get in/out of car.    Patient is accompained by: Family member   husband, Marlow Hendrie   Pertinent History HTN, obesity, bipolar disorder, ETOH use, arthritis, gastric bypass sg, Left femur ORIF Jan 2019, Covid 05/21/2020    Patient Stated Goals To be able use prosthesis to walk in community including outdoor festivals,  travel    Currently in Pain? Yes    Pain Score 3     Pain Location Leg  residual limb   Pain Orientation Left    Pain Descriptors / Indicators Aching;Sore    Pain Onset 1 to 4 weeks ago    Pain Frequency Intermittent    Aggravating Factors  prosthesis    Pain Relieving Factors removing prosthesis.                               [place] [facility] PT Treatment/Exercise - 04/07/21 1100       Transfers   Transfers Sit to Stand;Stand to Sit    Sit to Stand 5: Supervision;4: Min assist;With upper extremity assist;With armrests;From chair/3-in-1;From elevated surface;Other (comment)   to RW,  supervision initially & minA end of session with fatigue   Sit to Stand Details Verbal cues for safe use of DME/AE    Stand to Sit 5: Supervision;With upper extremity assist;With armrests;To chair/3-in-1;Other (comment)   from RW   Stand to Sit Details  (indicate cue type and reason) Verbal cues for safe use of DME/AE      Ambulation/Gait   Ambulation/Gait Yes    Ambulation/Gait Assistance 5: Supervision    Ambulation Distance (Feet) 55 Feet    Assistive device Rolling walker;Prosthesis    Gait Pattern --    Ramp 3: Mod assist   RW & locked TFA prosthesis   Ramp Details (indicate cue type and reason) PT demo & verbal cues on technique    Curb 4: Min assist   RW & locked TFA prosthesis   Curb Details (indicate cue type and reason) PT demo & verbal cues on technique    Gait Comments --      Posture/Postural Control   Posture/Postural Control Postural limitations    Postural Limitations --      Self-Care   Self-Care Other Self-Care Comments    Other Self-Care Comments  Pt unable to drive with prosthesis on her limb due to size will not fit under steering wheel.  PT demo & verbal cues for removing prosthesis, placing RW & prosthesis in passenger side of car then removing.  pt & husband verbalized understanding with plans to try at home.      Neuro Re-ed    Neuro Re-ed Details  standing posture activities: standing with posterior pelvis to counter with RW in front for 3 min with light BUE support on counter beside her hips. goal for home is to increase endurance over time.   Standing with back to door frame RW in front reaching single UE & BUEs 2 reps ea 2 deep breathes.      Prosthetics   Prosthetic Care Comments  --    Current prosthetic wear tolerance (days/week)  daily since hospital d/c 03/18/2021.    Current prosthetic wear tolerance (#hours/day)  5-6 hrs  1x/day    Current prosthetic weight-bearing tolerance (hours/day)  Pt tolerated standing with partial weight on prosthesis for 4 min without c/o limb discomfort but groin pressure which is probably due to limb too deep in socket  (needs more ply socks)    Edema pitting    Education Provided Skin check;Residual limb care;Proper Donning;Proper wear schedule/adjustment;Other (comment)    see prosthetic care comments                      PT Short Term Goals - 03/24/21 1258       PT SHORT TERM GOAL #1   Title Patient demonstrates & verbalizes proper donning of prosthesis with  supervision for balance.    Time 1    Period Months    Status New    Target Date 04/10/21      PT SHORT TERM GOAL #2   Title Patient tolerates wear of prosthesis >/= 9 hrs total / day without increase in skin issues.    Time 1    Period Months    Status New    Target Date 04/10/21      PT SHORT TERM GOAL #3   Title Patient sit to/from stand std ht chair to RW controlling prosthetic knee with supervision.    Time 1    Period Months    Status New    Target Date 04/10/21      PT SHORT TERM GOAL #4   Title Patient ambulates 76' with RW & prosthesis with supervision.    Time 1    Period Months    Status New    Target Date 04/10/21               PT Long Term Goals - 03/24/21 1259       PT LONG TERM GOAL #1   Title Patient tolerates wear of prosthesis daily >80% of awake hours without skin or limb pain issues.    Time 3    Period Months    Status New    Target Date 06/22/21      PT LONG TERM GOAL #2   Title Patient verbalizes & demonstrates understanding of prosthetic care to enable safe utilization of prosthesis.    Time 3    Period Months    Status New    Target Date 06/22/21      PT LONG TERM GOAL #3   Title Patient ambulates >500' including some uneven terrain with RW & Transfemoral prosthesis modified independent.    Time 6    Period Months    Status New    Target Date 06/22/21      PT LONG TERM GOAL #4   Title Patient negotiates ramps & curbs with RW & transfemoral prosthesis modified independent.    Time 6    Period Months    Status New    Target Date 06/22/21      PT LONG TERM GOAL #5   Title Berg Balance task with RW support >/= 45/56    Time 6    Period Months    Status New    Target Date 06/22/21                   Plan -  04/07/21 1059     Clinical Impression Statement Patient appears to be improving how far she can walk using walking program PT instructed last visit.  PT worked on [employer] but needs further work in PT prior to attempting outside of PT.  PT also instructed in standing posture activities which she seems to understand.    Personal Factors and Comorbidities Comorbidity 3+;Past/Current Experience;Time since onset of injury/illness/exacerbation    Comorbidities HTN, obesity, bipolar disorder, ETOH use, arthritis, gastric bypass sg, Left femur ORIF Jan 2019    Examination-Activity Limitations Lift;Locomotion Level;Reach Overhead;Stairs;Stand;Transfers    Examination-Participation Restrictions Community Activity;Occupation;Other   travel   Stability/Clinical Decision Making Evolving/Moderate complexity    Rehab Potential Good    PT Frequency 2x / week    PT Duration Other (comment)   6 months total, Medicare recert due in 90 days   PT Treatment/Interventions ADLs/Self Care Home Management;DME Instruction;Gait training;Stair  training;Functional mobility training;Therapeutic activities;Therapeutic exercise;Balance training;Neuromuscular re-education;Patient/family education;Prosthetic Training;Manual techniques;Passive range of motion;Vestibular    PT Next Visit Plan work on prosthetic gait with RW including curbs & ramps and sit to/from stand, therapeutic exercise for strength, flexibility & endurance.    Consulted and Agree with Plan of Care Patient;Family member/caregiver    Family Member Consulted husband, Janann Boeve             Patient will benefit from skilled therapeutic intervention in order to improve the following deficits and impairments:  Abnormal gait, Cardiopulmonary status limiting activity, Decreased activity tolerance, Decreased balance, Decreased endurance, Decreased knowledge of use of DME, Decreased mobility, Decreased range of motion, Decreased skin integrity, Decreased  strength, Increased edema, Postural dysfunction, Prosthetic Dependency, Obesity  Visit Diagnosis: Unsteadiness on feet  Other abnormalities of gait and mobility  History of falling  Abnormal posture  Muscle weakness (generalized)  Stiffness of left hip, not elsewhere classified  Stiffness of left knee, not elsewhere classified     Problem List Patient Active Problem List   Diagnosis Date Noted   MDD (major depressive disorder), recurrent severe, without psychosis ([place])    Suicide attempt by adequate means ([place])    Suicidal ideation    Pressure injury of skin 11/30/2020   Wound dehiscence 11/29/2020   Presence of retained hardware    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[place]) 11/06/2020   Knee osteomyelitis, left ([place]) 10/18/2020   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 ([facility])    Arthritis of left knee 09/20/2020   Septic arthritis of knee, left ([place]) 09/17/2020   Cellulitis and abscess of lower extremity 09/11/2020   Debility 05/02/2020   Urinary incontinence 05/02/2020   Pain in left shin    Urinary frequency    Sleep disturbance    Pain    Anxiety state    Urinary urgency    Drug induced constipation    Post-operative pain    Benign essential HTN    Pruritus    Left medial tibial plateau fracture 03/01/2020   Morbid obesity ([place])    Acute blood loss anemia    Postoperative pain    Tibial plateau fracture 02/25/2020   Tibial plateau fracture, left 02/25/2020   Hyponatremia 02/25/2020   Alcohol use disorder, moderate, dependence ([place]) 02/25/2020   Essential hypertension 02/25/2020    Jamey Reas, PT, DPT 04/07/2021, 12:50 PM  [facility] [address] [place], [place], [address] Phone: [phone]   Fax:  [phone]  Name: [name] MRN: [id number] Date of Birth: 1956/04/20

## 2021-04-09 ENCOUNTER — Encounter: Payer: Self-pay | Admitting: Physical Therapy

## 2021-04-09 ENCOUNTER — Ambulatory Visit (INDEPENDENT_AMBULATORY_CARE_PROVIDER_SITE_OTHER): Payer: BC Managed Care – PPO | Admitting: Physical Therapy

## 2021-04-09 ENCOUNTER — Telehealth: Payer: Self-pay | Admitting: Physical Therapy

## 2021-04-09 ENCOUNTER — Other Ambulatory Visit: Payer: Self-pay

## 2021-04-09 ENCOUNTER — Encounter: Payer: BC Managed Care – PPO | Admitting: Physical Therapy

## 2021-04-09 DIAGNOSIS — R2689 Other abnormalities of gait and mobility: Secondary | ICD-10-CM | POA: Diagnosis not present

## 2021-04-09 DIAGNOSIS — R2681 Unsteadiness on feet: Secondary | ICD-10-CM | POA: Diagnosis not present

## 2021-04-09 DIAGNOSIS — R293 Abnormal posture: Secondary | ICD-10-CM

## 2021-04-09 DIAGNOSIS — M6281 Muscle weakness (generalized): Secondary | ICD-10-CM

## 2021-04-09 DIAGNOSIS — M25652 Stiffness of left hip, not elsewhere classified: Secondary | ICD-10-CM

## 2021-04-09 DIAGNOSIS — M25662 Stiffness of left knee, not elsewhere classified: Secondary | ICD-10-CM

## 2021-04-09 DIAGNOSIS — Z9181 History of falling: Secondary | ICD-10-CM

## 2021-04-09 NOTE — Telephone Encounter (Signed)
Erin Ms. Vetter wanted me to let you know PT recommendations for her returning to work.  I recommend 2 hrs/day for 2 weeks, then 4hrs/day for next 2 weeks, then 6hrs/day for following 2 weeks, then back to 8hr work days.  We will need to reassess near end of each 2 weeks.  Let me know if you have questions, Jamey Reas, PT, DPT Physical Therapist Specializing in Prosthetic Rehab [facility]. [address] [place], [place] [address] Phone [phone] FAX [phone]

## 2021-04-09 NOTE — Therapy (Signed)
[place], [place], [address] Phone: [phone]   Fax:  [phone]  Physical Therapy Treatment  Patient Details  Name: [name] MRN: [id number] Date of Birth: Sep 09, 1955 Referring Provider (PT): Meridee Score, MD   Encounter Date: 04/09/2021   PT End of Session - 04/09/21 1059     Visit Number 3    Number of Visits 50    Date for PT Re-Evaluation 06/22/21    Authorization Type BCBS primary & Medicare 2nd    Progress Note Due on Visit 10    PT Start Time 1055    PT Stop Time 1140    PT Time Calculation (min) 45 min    Activity Tolerance Patient tolerated treatment well;Patient limited by fatigue    Behavior During Therapy [facility] for tasks assessed/performed             Past Medical History:  Diagnosis Date   Anemia    Anxiety    Arthritis    Asthma    no longer treated   Bipolar disorder ([place])    off meds for 4-5 years   Depression    Hypertension     Past Surgical History:  Procedure Laterality Date   ABDOMINAL HYSTERECTOMY     AMPUTATION Left 10/18/2020   Procedure: LEFT ABOVE KNEE AMPUTATION;  Surgeon: Newt Minion, MD;  Location: [place];  Service: Orthopedics;  Laterality: Left;   BREAST SURGERY     left lumpectomy    gastric bypass surgery      HARDWARE REMOVAL Left 08/28/2020   Procedure: HARDWARE REMOVAL;  Surgeon: Hiram Gash, MD;  [facility];  Service: Orthopedics;  Laterality: Left;   hemorrhjoid surgery      IRRIGATION AND DEBRIDEMENT ABSCESS Left 09/11/2020   Procedure: Irrigation and debridement of deep abscess- left leg, septic joint;  Surgeon: Hiram Gash, MD;  [facility];  Service: Orthopedics;  Laterality: Left;   KNEE ARTHROSCOPY WITH MEDIAL MENISECTOMY Left 08/28/2020   Procedure: KNEE ARTHROSCOPY WITH CHONDROPLASTY, MANIPULATION WITH LYSIS OF ADHESIONS, POSSIBLE MEDIAL MENISECTOMY;  Surgeon: Hiram Gash, MD;  [facility];  Service: Orthopedics;  Laterality:  Left;   left femur fracture surgery      x 2   ORIF TIBIA PLATEAU Left 02/26/2020   Procedure: OPEN REDUCTION INTERNAL FIXATION (ORIF) TIBIAL PLATEAU;  Surgeon: Shona Needles, MD;  Location: [place];  Service: Orthopedics;  Laterality: Left;   STUMP REVISION Left 11/29/2020   Procedure: REVISION LEFT ABOVE KNEE AMPUTATION with removal of deep hardware;  Surgeon: Newt Minion, MD;  Location: [place];  Service: Orthopedics;  Laterality: Left;   TUBAL LIGATION      There were no vitals filed for this visit.   Subjective Assessment - 04/09/21 1056     Subjective She continues to try to walk into PT building with husband following with w/c.  she has a spot on limb that is draining slightly.    Patient is accompained by: Family member   husband, [name]   Pertinent History HTN, obesity, bipolar disorder, ETOH use, arthritis, gastric bypass sg, Left femur ORIF Jan 2019, Covid 05/21/2020    Patient Stated Goals To be able use prosthesis to walk in community including outdoor festivals,  travel    Currently in Pain? No/denies    Pain Onset 1 to 4 weeks ago  [place] Adult PT Treatment/Exercise - 04/09/21 1100       Transfers   Transfers Sit to Stand;Stand to Sit    Sit to Stand 5: Supervision;4: Min assist;With upper extremity assist;With armrests;From chair/3-in-1;From elevated surface;Other (comment)   to RW,  supervision initially & minA end of session with fatigue   Sit to Stand Details Verbal cues for safe use of DME/AE    Stand to Sit 5: Supervision;With upper extremity assist;With armrests;To chair/3-in-1;Other (comment)   from RW   Stand to Sit Details (indicate cue type and reason) Verbal cues for safe use of DME/AE      Ambulation/Gait   Ambulation/Gait Yes    Ambulation/Gait Assistance 5: Supervision    Ambulation Distance (Feet) 75 Feet    Assistive device Rolling walker;Prosthesis    Ramp 3: Mod assist   RW & locked TFA  prosthesis   Ramp Details (indicate cue type and reason) PT demo, manual & verbal cues on technique    Curb 4: Min assist   RW & locked TFA prosthesis   Curb Details (indicate cue type and reason) PT verbal cues on technique      Posture/Postural Control   Posture/Postural Control Postural limitations      Self-Care   Self-Care Other Self-Care Comments    Other Self-Care Comments  --      Neuro Re-ed    Neuro Re-ed Details  --      Prosthetics   Prosthetic Care Comments  PT instructed pt & her husband in using cloth to rub limb and use of anti-itch cream when first removing prosthesis & wiping off prior to donning prosthesis.  PT recommending 2x/day wear for 4 hrs to increase overall wear time but enable skin to dry between wears.  PT recommending wear prosthesis when seated to perform work.    Current prosthetic wear tolerance (days/week)  daily since hospital d/c 03/18/2021.    Current prosthetic wear tolerance (#hours/day)  5-6 hrs  1x/day    Current prosthetic weight-bearing tolerance (hours/day)  Pt tolerated standing with partial weight on prosthesis for 4 min without c/o limb discomfort but groin pressure which is probably due to limb too deep in socket  (needs more ply socks)    Edema pitting    Education Provided Skin check;Residual limb care;Proper Donning;Proper wear schedule/adjustment;Other (comment)   see prosthetic care comments   Person(s) Educated Patient;Spouse    Education Method Explanation;Verbal cues    Education Method Verbalized understanding;Verbal cues required;Needs further instruction    Donning Prosthesis Supervision                       PT Short Term Goals - 04/09/21 1246       PT SHORT TERM GOAL #1   Title Patient demonstrates & verbalizes proper donning of prosthesis with supervision for balance.    Time 1    Period Months    Status Achieved    Target Date 04/10/21      PT SHORT TERM GOAL #2   Title Patient tolerates wear of  prosthesis >/= 9 hrs total / day without increase in skin issues.    Time 1    Period Months    Status Not Met    Target Date 04/10/21      PT SHORT TERM GOAL #3   Title Patient sit to/from stand std ht chair to RW controlling prosthetic knee with supervision.    Time 1    Period Months  Status Partially Met    Target Date 04/10/21      PT SHORT TERM GOAL #4   Title Patient ambulates 28' with RW & prosthesis with supervision.    Time 1    Period Months    Status Achieved    Target Date 04/10/21               PT Long Term Goals - 04/09/21 1247       PT LONG TERM GOAL #1   Title Patient tolerates wear of prosthesis daily >80% of awake hours without skin or limb pain issues.    Time 3    Period Months    Status On-going    Target Date 06/22/21      PT LONG TERM GOAL #2   Title Patient verbalizes & demonstrates understanding of prosthetic care to enable safe utilization of prosthesis.    Time 3    Period Months    Status On-going    Target Date 06/22/21      PT LONG TERM GOAL #3   Title Patient ambulates >500' including some uneven terrain with RW & Transfemoral prosthesis modified independent.    Time 6    Period Months    Status On-going    Target Date 06/22/21      PT LONG TERM GOAL #4   Title Patient negotiates ramps & curbs with RW & transfemoral prosthesis modified independent.    Time 6    Period Months    Status On-going    Target Date 06/22/21      PT LONG TERM GOAL #5   Title Berg Balance task with RW support >/= 45/56    Time 6    Period Months    Status On-going    Target Date 06/22/21                   Plan - 04/09/21 1059     Clinical Impression Statement Patient met or partially met 3 of 4 STGs.  Pt reviewed wear recommendations with rationale. She verbalizes understanding.  Her skin issues on residual limb appear from scratching her limb with itching.  She is slowly improving her mobility.    Personal Factors and  Comorbidities Comorbidity 3+;Past/Current Experience;Time since onset of injury/illness/exacerbation    Comorbidities HTN, obesity, bipolar disorder, ETOH use, arthritis, gastric bypass sg, Left femur ORIF Jan 2019    Examination-Activity Limitations Lift;Locomotion Level;Reach Overhead;Stairs;Stand;Transfers    Examination-Participation Restrictions Community Activity;Occupation;Other   travel   Stability/Clinical Decision Making Evolving/Moderate complexity    Rehab Potential Good    PT Frequency 2x / week    PT Duration Other (comment)   6 months total, Medicare recert due in 90 days   PT Treatment/Interventions ADLs/Self Care Home Management;DME Instruction;Gait training;Stair training;Functional mobility training;Therapeutic activities;Therapeutic exercise;Balance training;Neuromuscular re-education;Patient/family education;Prosthetic Training;Manual techniques;Passive range of motion;Vestibular    PT Next Visit Plan set updated STGs, work on prosthetic gait with RW including curbs & ramps and sit to/from stand, therapeutic exercise for strength, flexibility & endurance.    Consulted and Agree with Plan of Care Patient;Family member/caregiver    Family Member Consulted husband, Cerinity Zynda             Patient will benefit from skilled therapeutic intervention in order to improve the following deficits and impairments:  Abnormal gait, Cardiopulmonary status limiting activity, Decreased activity tolerance, Decreased balance, Decreased endurance, Decreased knowledge of use of DME, Decreased mobility, Decreased range of motion, Decreased skin integrity,  Decreased strength, Increased edema, Postural dysfunction, Prosthetic Dependency, Obesity  Visit Diagnosis: Unsteadiness on feet  Other abnormalities of gait and mobility  History of falling  Abnormal posture  Muscle weakness (generalized)  Stiffness of left hip, not elsewhere classified  Stiffness of left knee, not elsewhere  classified     Problem List Patient Active Problem List   Diagnosis Date Noted   MDD (major depressive disorder), recurrent severe, without psychosis ([place])    Suicide attempt by adequate means ([place])    Suicidal ideation    Pressure injury of skin 11/30/2020   Wound dehiscence 11/29/2020   Presence of retained hardware    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[place]) 11/06/2020   Knee osteomyelitis, left ([place]) 10/18/2020   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 ([facility])    Arthritis of left knee 09/20/2020   Septic arthritis of knee, left ([place]) 09/17/2020   Cellulitis and abscess of lower extremity 09/11/2020   Debility 05/02/2020   Urinary incontinence 05/02/2020   Pain in left shin    Urinary frequency    Sleep disturbance    Pain    Anxiety state    Urinary urgency    Drug induced constipation    Post-operative pain    Benign essential HTN    Pruritus    Left medial tibial plateau fracture 03/01/2020   Morbid obesity ([place])    Acute blood loss anemia    Postoperative pain    Tibial plateau fracture 02/25/2020   Tibial plateau fracture, left 02/25/2020   Hyponatremia 02/25/2020   Alcohol use disorder, moderate, dependence ([place]) 02/25/2020   Essential hypertension 02/25/2020    Jamey Reas, PT, DPT 04/09/2021, 12:55 PM  [facility] [address] [place], [place], [address] Phone: [phone]   Fax:  [phone]  Name: [name] MRN: [id number] Date of Birth: [birth year]

## 2021-04-15 ENCOUNTER — Encounter: Payer: BC Managed Care – PPO | Admitting: Physical Therapy

## 2021-04-16 ENCOUNTER — Ambulatory Visit (INDEPENDENT_AMBULATORY_CARE_PROVIDER_SITE_OTHER): Payer: BC Managed Care – PPO | Admitting: Physical Therapy

## 2021-04-16 ENCOUNTER — Other Ambulatory Visit: Payer: Self-pay

## 2021-04-16 ENCOUNTER — Encounter: Payer: Self-pay | Admitting: Physical Therapy

## 2021-04-16 DIAGNOSIS — R2689 Other abnormalities of gait and mobility: Secondary | ICD-10-CM

## 2021-04-16 DIAGNOSIS — R2681 Unsteadiness on feet: Secondary | ICD-10-CM | POA: Diagnosis not present

## 2021-04-16 DIAGNOSIS — Z9181 History of falling: Secondary | ICD-10-CM | POA: Diagnosis not present

## 2021-04-16 DIAGNOSIS — R293 Abnormal posture: Secondary | ICD-10-CM

## 2021-04-16 DIAGNOSIS — M6281 Muscle weakness (generalized): Secondary | ICD-10-CM

## 2021-04-16 DIAGNOSIS — M25652 Stiffness of left hip, not elsewhere classified: Secondary | ICD-10-CM

## 2021-04-16 NOTE — Therapy (Signed)
[facility] [place], [place], [address] Phone: [phone]   Fax:  [phone]  Physical Therapy Treatment  Patient Details  Name: [name] MRN: [id number] Date of Birth: May 16, 1955 Referring Provider (PT): Rachel Score, MD   Encounter Date: 04/16/2021   PT End of Session - 04/16/21 1059     Visit Number 4    Number of Visits 50    Date for PT Re-Evaluation 06/22/21    Authorization Type BCBS primary & Medicare 2nd    Progress Note Due on Visit 10    PT Start Time 1100    PT Stop Time 1144    PT Time Calculation (min) 44 min    Activity Tolerance Patient tolerated treatment well;Patient limited by fatigue    Behavior During Therapy [facility] for tasks assessed/performed             Past Medical History:  Diagnosis Date   Anemia    Anxiety    Arthritis    Asthma    no longer treated   Bipolar disorder ([place])    off meds for 4-5 years   Depression    Hypertension     Past Surgical History:  Procedure Laterality Date   ABDOMINAL HYSTERECTOMY     AMPUTATION Left 10/18/2020   Procedure: LEFT ABOVE KNEE AMPUTATION;  Surgeon: Rachel Minion, MD;  Location: [place];  Service: Orthopedics;  Laterality: Left;   BREAST SURGERY     left lumpectomy    gastric bypass surgery      HARDWARE REMOVAL Left 08/28/2020   Procedure: HARDWARE REMOVAL;  Surgeon: Rachel Gash, MD;  [facility];  Service: Orthopedics;  Laterality: Left;   hemorrhjoid surgery      IRRIGATION AND DEBRIDEMENT ABSCESS Left 09/11/2020   Procedure: Irrigation and debridement of deep abscess- left leg, septic joint;  Surgeon: Rachel Gash, MD;  [facility];  Service: Orthopedics;  Laterality: Left;   KNEE ARTHROSCOPY WITH MEDIAL MENISECTOMY Left 08/28/2020   Procedure: KNEE ARTHROSCOPY WITH CHONDROPLASTY, MANIPULATION WITH LYSIS OF ADHESIONS, POSSIBLE MEDIAL MENISECTOMY;  Surgeon: Rachel Gash, MD;  [facility];  Service: Orthopedics;  Laterality:  Left;   left femur fracture surgery      x 2   ORIF TIBIA PLATEAU Left 02/26/2020   Procedure: OPEN REDUCTION INTERNAL FIXATION (ORIF) TIBIAL PLATEAU;  Surgeon: Rachel Needles, MD;  Location: [facility];  Service: Orthopedics;  Laterality: Left;   [place] REVISION Left 11/29/2020   Procedure: REVISION LEFT ABOVE KNEE AMPUTATION with removal of deep hardware;  Surgeon: Rachel Minion, MD;  Location: [facility];  Service: Orthopedics;  Laterality: Left;   TUBAL LIGATION      There were no vitals filed for this visit.   Subjective Assessment - 04/16/21 1059     Subjective She could not get her prosthesis on limb this morning even with decreasing ply socks from 3-ply to 1-ply.  She is still wearing prosthesis from ~10am to 4-430pm but only once per day.    Patient is accompained by: Family member   husband, [name]   Pertinent History HTN, obesity, bipolar disorder, ETOH use, arthritis, gastric bypass sg, Left femur ORIF Jan 2019, Covid 05/21/2020    Patient Stated Goals To be able use prosthesis to walk in community including outdoor festivals,  travel    Currently in Pain? No/denies    Pain Onset 1 to 4 weeks ago  [facility] Adult PT Treatment/Exercise - 04/16/21 1100       Transfers   Transfers Sit to Stand;Stand to Sit    Sit to Stand 4: Min guard;From elevated surface;With upper extremity assist;With armrests;From chair/3-in-1;Other (comment)   to RW   Sit to Stand Details --    Sit to Stand Details (indicate cue type and reason) unable to stand from her w/c today, so sliding transfer to level mat table then PT raised mat table to 22"    Stand to Sit With upper extremity assist;With armrests;To chair/3-in-1;Other (comment);4: Min guard   from RW   Stand to Sit Details (indicate cue type and reason) --      Ambulation/Gait   Ambulation/Gait Yes    Ambulation/Gait Assistance 4: Min guard    Ambulation/Gait Assistance Details verbal cues for  upright posture & wt shift over prosthesis in stance    Ambulation Distance (Feet) 35 Feet    Assistive device Rolling walker;Prosthesis    Ramp --    Curb --      Posture/Postural Control   Posture/Postural Control --      Self-Care   Self-Care --      Exercises   Other Exercises  seated at edge of w/c without back support: green theraband alternating UEs & BUEs 10 reps ea rows & forward reach.      Prosthetics   Prosthetic Care Comments  Pt arrived with high anxiety as unable to donne prosthesis this morning.  She had increased limb volume from fluid retention.  PT educated on shrinker wear & salt consumption can increase fluid retention.  PT assisted to donne with 0-ply fit. Pt reported it was very tight still.  She tolerated wear for 15 min of PT session.  PT educated on using 4" block under prosthesis so prosthesis is not "pulling" on limb from weight.  she reported felt better.    Current prosthetic wear tolerance (days/week)  daily    Current prosthetic wear tolerance (#hours/day)  5-6 hrs  1x/day    Current prosthetic weight-bearing tolerance (hours/day)  Pt tolerated standing with partial weight on prosthesis for 4 min without c/o limb discomfort but groin pressure which is probably due to limb too deep in socket  (needs more ply socks)    Edema pitting    Education Provided Proper Donning;Proper wear schedule/adjustment;Other (comment);Correct ply sock adjustment   see prosthetic care comments   Person(s) Educated Patient;Spouse    Education Method Explanation;Verbal cues;Demonstration;Tactile cues    Education Method Verbalized understanding;Verbal cues required;Tactile cues required;Needs further instruction    Donning Prosthesis Minimal assist            PT had discussion on noticing small achievements and giving herself credit.  PT recommended dressing daily even if she is not leaving her house.  PT also recommended getting out of recliner and at least into w/c  simulating normal work & functional hours. Pt verbalized understanding.            PT Short Term Goals - 04/16/21 1536       PT SHORT TERM GOAL #1   Title Patient verbalizes understanding of adjusting ply socks.    Time 1    Period Months    Status New    Target Date 05/22/21      PT SHORT TERM GOAL #2   Title Patient tolerates wear of prosthesis >/= 8 hrs total / day without increase in skin issues.    Time 1  Period Months    Status Revised    Target Date 05/22/21      PT SHORT TERM GOAL #3   Title Patient sit to/from stand std ht chair to RW controlling prosthetic knee with supervision.    Time 1    Period Months    Status On-going    Target Date 05/22/21      PT SHORT TERM GOAL #4   Title Patient ambulates 150' with RW & prosthesis with supervision.    Time 1    Period Months    Status Revised    Target Date 05/22/21      PT SHORT TERM GOAL #5   Title Patient negotiates curb with RW & TFA prosthesis with min guard.    Time 1    Period Months    Status New    Target Date 05/22/21               PT Long Term Goals - 04/09/21 1247       PT LONG TERM GOAL #1   Title Patient tolerates wear of prosthesis daily >80% of awake hours without skin or limb pain issues.    Time 3    Period Months    Status On-going    Target Date 06/22/21      PT LONG TERM GOAL #2   Title Patient verbalizes & demonstrates understanding of prosthetic care to enable safe utilization of prosthesis.    Time 3    Period Months    Status On-going    Target Date 06/22/21      PT LONG TERM GOAL #3   Title Patient ambulates >500' including some uneven terrain with RW & Transfemoral prosthesis modified independent.    Time 6    Period Months    Status On-going    Target Date 06/22/21      PT LONG TERM GOAL #4   Title Patient negotiates ramps & curbs with RW & transfemoral prosthesis modified independent.    Time 6    Period Months    Status On-going    Target Date  06/22/21      PT LONG TERM GOAL #5   Title Berg Balance task with RW support >/= 45/56    Time 6    Period Months    Status On-going    Target Date 06/22/21                   Plan - 04/16/21 1059     Clinical Impression Statement Patient had increased emotional anxiety today.  She appears to have increased fluid retention today which caused socket too tight even with 0-ply socks.  PT discussed with pt need to dress daily even if staying home, be mobilie even in w/c earlier in day which would be more similar to normal work hours and be aware of small gains.  She verbalizes understanding and PT hopes will help emotionally.  Pt continues to benefit from skilled PT.    Personal Factors and Comorbidities Comorbidity 3+;Past/Current Experience;Time since onset of injury/illness/exacerbation    Comorbidities HTN, obesity, bipolar disorder, ETOH use, arthritis, gastric bypass sg, Left femur ORIF Jan 2019    Examination-Activity Limitations Lift;Locomotion Level;Reach Overhead;Stairs;Stand;Transfers    Examination-Participation Restrictions Community Activity;Occupation;Other   travel   Stability/Clinical Decision Making Evolving/Moderate complexity    Rehab Potential Good    PT Frequency 2x / week    PT Duration Other (comment)   6 months total, Medicare recert due  in 90 days   PT Treatment/Interventions ADLs/Self Care Home Management;DME Instruction;Gait training;Stair training;Functional mobility training;Therapeutic activities;Therapeutic exercise;Balance training;Neuromuscular re-education;Patient/family education;Prosthetic Training;Manual techniques;Passive range of motion;Vestibular    PT Next Visit Plan work towards updated STGs, work on prosthetic gait with RW including curbs & ramps and sit to/from stand, therapeutic exercise for strength, flexibility & endurance.    Consulted and Agree with Plan of Care Patient;Family member/caregiver    Family Member Consulted husband, Kaneshia Cater             Patient will benefit from skilled therapeutic intervention in order to improve the following deficits and impairments:  Abnormal gait, Cardiopulmonary status limiting activity, Decreased activity tolerance, Decreased balance, Decreased endurance, Decreased knowledge of use of DME, Decreased mobility, Decreased range of motion, Decreased skin integrity, Decreased strength, Increased edema, Postural dysfunction, Prosthetic Dependency, Obesity  Visit Diagnosis: Unsteadiness on feet  Other abnormalities of gait and mobility  History of falling  Abnormal posture  Muscle weakness (generalized)  Stiffness of left hip, not elsewhere classified     Problem List Patient Active Problem List   Diagnosis Date Noted   MDD (major depressive disorder), recurrent severe, without psychosis ([place])    Suicide attempt by adequate means ([place])    Suicidal ideation    Pressure injury of skin 11/30/2020   Wound dehiscence 11/29/2020   Presence of retained hardware    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[place]) 11/06/2020   Knee osteomyelitis, left ([place]) 10/18/2020   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 ([facility])    Arthritis of left knee 09/20/2020   Septic arthritis of knee, left ([place]) 09/17/2020   Cellulitis and abscess of lower extremity 09/11/2020   Debility 05/02/2020   Urinary incontinence 05/02/2020   Pain in left shin    Urinary frequency    Sleep disturbance    Pain    Anxiety state    Urinary urgency    Drug induced constipation    Post-operative pain    Benign essential HTN    Pruritus    Left medial tibial plateau fracture 03/01/2020   Morbid obesity ([place])    Acute blood loss anemia    Postoperative pain    Tibial plateau fracture 02/25/2020   Tibial plateau fracture, left 02/25/2020   Hyponatremia 02/25/2020   Alcohol use disorder, moderate, dependence ([place]) 02/25/2020   Essential hypertension 02/25/2020    Jamey Reas,  PT, DPT 04/16/2021, 3:38 PM  [facility] [address] [place], [place], [address] Phone: [phone]   Fax:  [phone]  Name: [name] MRN: [id number] Date of Birth: [birth year]

## 2021-04-22 ENCOUNTER — Encounter: Payer: BC Managed Care – PPO | Admitting: Physical Therapy

## 2021-04-24 ENCOUNTER — Encounter: Payer: Medicare Other | Admitting: Physical Therapy

## 2021-04-29 ENCOUNTER — Encounter: Payer: BC Managed Care – PPO | Admitting: Physical Therapy

## 2021-05-01 ENCOUNTER — Encounter: Payer: Medicare Other | Admitting: Physical Therapy

## 2021-05-05 ENCOUNTER — Encounter: Payer: BC Managed Care – PPO | Admitting: Physical Therapy

## 2021-05-07 ENCOUNTER — Encounter: Payer: BC Managed Care – PPO | Admitting: Physical Therapy

## 2021-05-09 ENCOUNTER — Telehealth: Payer: Self-pay | Admitting: Family

## 2021-05-09 NOTE — Telephone Encounter (Signed)
Pt called stating her amputee stump on left side is swollen and pained and she thinks it is possibly infected. Pa [facility] doesn't have any opening until 1/30 and pt need to be seen sooner then that if possible. Please call pt at [phone].

## 2021-05-09 NOTE — Telephone Encounter (Signed)
Called pt and she said the area is not hot, red or draining. It is swollen and has been that way for a week. Advised I can work her in the sch pt requested afternoon. Appt sch for 05/14/21 at 2:30

## 2021-05-13 ENCOUNTER — Encounter: Payer: BC Managed Care – PPO | Admitting: Physical Therapy

## 2021-05-14 ENCOUNTER — Ambulatory Visit (INDEPENDENT_AMBULATORY_CARE_PROVIDER_SITE_OTHER): Payer: BC Managed Care – PPO | Admitting: Family

## 2021-05-14 ENCOUNTER — Ambulatory Visit: Payer: Self-pay

## 2021-05-14 ENCOUNTER — Encounter: Payer: Self-pay | Admitting: Family

## 2021-05-14 DIAGNOSIS — M898X5 Other specified disorders of bone, thigh: Secondary | ICD-10-CM | POA: Diagnosis not present

## 2021-05-14 NOTE — Progress Notes (Signed)
Office Visit Note   [name]           Date of Birth: 1955-10-07           MRN: [id number] Visit Date: 05/14/2021              Requested by: [name], MD [place],  [place] [address] PCP: [name], MD  Chief Complaint  Patient presents with   Left Leg - Follow-up    11/29/20 left AKA       HPI: The patient is a 66 year old woman with presents today complaining of left thigh pain and swelling.  She is status post left above-knee amputation last year.  She is quite concerned today as she states her pain that she has been feeling for the last 2 weeks has not been easing if anything its been getting worse and feels similar to the pain she had prior to her above-knee amputation pain she had in her thigh and shin from osteomyelitis.  Unable to tolerate her prosthesis or palpation of residual limb  Denies fever or chills no redness no warmth  Assessment & Plan: Visit Diagnoses:  1. Pain of left femur     Plan: radiographs unequivocal. Due to degree of pain and history will send to CT to rule out abscess  Follow-Up Instructions: No follow-ups on file.   Ortho Exam  Patient is alert, oriented, no adenopathy, well-dressed, normal affect, normal respiratory effort. On examination of left thigh there is no frank edema, erythema or warmth. No palpable fluctuance or mass. Tenderness with palpation of distal thigh and femur  Imaging: No results found. No images are attached to the encounter.  Labs: Lab Results  Component Value Date   HGBA1C 5.7 (H) 09/13/2020   ESRSEDRATE 17 10/07/2020   CRP 42.9 (H) 10/07/2020   REPTSTATUS 09/16/2020 FINAL 09/11/2020   GRAMSTAIN  09/11/2020    RARE WBC PRESENT, PREDOMINANTLY PMN NO ORGANISMS SEEN    CULT  09/11/2020    No growth aerobically or anaerobically. Performed at [facility], [place] [address] [place], [place] [address]      Lab Results  Component Value Date    ALBUMIN 3.3 (L) 03/08/2021   ALBUMIN 3.0 (L) 03/04/2020   ALBUMIN 3.4 (L) 02/25/2020    Lab Results  Component Value Date   MG 2.1 10/07/2020   Lab Results  Component Value Date   VD25OH 28.94 (L) 03/15/2020   VD25OH 18.03 (L) 02/25/2020    No results found for: PREALBUMIN CBC EXTENDED Latest Ref Rng & Units 03/08/2021 12/01/2020 11/30/2020  WBC 4.0 - 10.5 K/uL 8.7 6.4 8.3  RBC 3.87 - 5.11 MIL/uL 4.64 3.09(L) 3.28(L)  HGB 12.0 - 15.0 g/dL 10.6(L) 8.1(L) 8.7(L)  HCT 36.0 - 46.0 % 36.0 26.7(L) 28.4(L)  PLT 150 - 400 K/uL 317 312 341  NEUTROABS 1.7 - 7.7 K/uL 6.7 - -  LYMPHSABS 0.7 - 4.0 K/uL 1.3 - -     There is no height or weight on file to calculate BMI.  Orders:  Orders Placed This Encounter  Procedures   XR FEMUR MIN 2 VIEWS LEFT   No orders of the defined types were placed in this encounter.    Procedures: No procedures performed  Clinical Data: No additional findings.  ROS:  All other systems negative, except as noted in the HPI. Review of Systems  Constitutional:  Negative for chills and fever.  Skin:  Negative for  color change and wound.   Objective: Vital Signs: There were no vitals taken for this visit.  Specialty Comments:  No specialty comments available.  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 ([facility])    Arthritis of left knee 09/20/2020   Septic arthritis of knee, left ([place]) 09/17/2020   Cellulitis and abscess of lower extremity 09/11/2020   Debility 05/02/2020   Urinary incontinence 05/02/2020   Pain in left  shin    Urinary frequency    Sleep disturbance    Pain    Anxiety state    Urinary urgency    Drug induced constipation    Post-operative pain    Benign essential HTN    Pruritus    Left medial tibial plateau fracture 03/01/2020   Morbid obesity ([place])    Acute blood loss anemia    Postoperative pain    Tibial plateau fracture 02/25/2020   Tibial plateau fracture, left 02/25/2020   Hyponatremia 02/25/2020   Alcohol use disorder, moderate, dependence ([place]) 02/25/2020   Essential hypertension 02/25/2020   Past Medical History:  Diagnosis Date   Anemia    Anxiety    Arthritis    Asthma    no longer treated   Bipolar disorder ([place])    off meds for 4-5 years   Depression    Hypertension     No family history on file.  Past Surgical History:  Procedure Laterality Date   ABDOMINAL HYSTERECTOMY     AMPUTATION Left 10/18/2020   Procedure: LEFT ABOVE KNEE AMPUTATION;  Surgeon: Newt Minion, MD;  Location: [facility];  Service: Orthopedics;  Laterality: Left;   BREAST SURGERY     left lumpectomy    gastric bypass surgery      HARDWARE REMOVAL Left 08/28/2020   Procedure: HARDWARE REMOVAL;  Surgeon: Hiram Gash, MD;  [facility];  Service: Orthopedics;  Laterality: Left;   hemorrhjoid surgery      IRRIGATION AND DEBRIDEMENT ABSCESS Left 09/11/2020   Procedure: Irrigation and debridement of deep abscess- left leg, septic joint;  Surgeon: Hiram Gash, MD;  [facility];  Service: Orthopedics;  Laterality: Left;   KNEE ARTHROSCOPY WITH MEDIAL MENISECTOMY Left 08/28/2020   Procedure: KNEE ARTHROSCOPY WITH CHONDROPLASTY, MANIPULATION WITH LYSIS OF ADHESIONS, POSSIBLE MEDIAL MENISECTOMY;  Surgeon: Hiram Gash, MD;  [facility];  Service: Orthopedics;  Laterality: Left;   left femur fracture surgery      x 2   ORIF TIBIA PLATEAU Left 02/26/2020   Procedure: OPEN REDUCTION INTERNAL FIXATION (ORIF) TIBIAL PLATEAU;  Surgeon: Shona Needles, MD;  Location: [facility];  Service:  Orthopedics;  Laterality: Left;   STUMP REVISION Left 11/29/2020   Procedure: REVISION LEFT ABOVE KNEE AMPUTATION with removal of deep hardware;  Surgeon: Newt Minion, MD;  Location: [facility];  Service: Orthopedics;  Laterality: Left;   TUBAL LIGATION     Social History   Occupational History   Not on file  Tobacco Use   Smoking status: Former    Types: Cigars  Quit date: 11/14/2020    Years since quitting: 0.4   Smokeless tobacco: Never  Vaping Use   Vaping Use: Former   Substances: Nicotine, Flavoring  Substance and Sexual Activity   Alcohol use: Yes    Alcohol/week: 18.0 standard drinks    Types: 18 Cans of beer per week    Comment: 18 beers per week   Drug use: Never   Sexual activity: Not on file

## 2021-05-15 ENCOUNTER — Encounter: Payer: BC Managed Care – PPO | Admitting: Physical Therapy

## 2021-05-20 ENCOUNTER — Ambulatory Visit: Payer: BC Managed Care – PPO | Admitting: Family

## 2021-05-22 ENCOUNTER — Other Ambulatory Visit: Payer: Self-pay

## 2021-05-22 ENCOUNTER — Ambulatory Visit
Admission: RE | Admit: 2021-05-22 | Discharge: 2021-05-22 | Disposition: A | Discharge status: Home / Self Care | Payer: Medicare Other | Source: Ambulatory Visit | Attending: Family | Admitting: Family

## 2021-05-22 DIAGNOSIS — M898X5 Other specified disorders of bone, thigh: Secondary | ICD-10-CM

## 2021-05-26 ENCOUNTER — Telehealth: Payer: Self-pay | Admitting: Orthopedic Surgery

## 2021-05-26 NOTE — Telephone Encounter (Signed)
Pending for [employer]

## 2021-05-26 NOTE — Telephone Encounter (Signed)
Patient called asked if the results of the CT scan can be given to her over the phone? The number to contact patient is [phone]

## 2021-05-27 ENCOUNTER — Other Ambulatory Visit: Payer: Self-pay | Admitting: Family

## 2021-05-27 ENCOUNTER — Ambulatory Visit (INDEPENDENT_AMBULATORY_CARE_PROVIDER_SITE_OTHER): Payer: BC Managed Care – PPO | Admitting: Orthopedic Surgery

## 2021-05-27 ENCOUNTER — Other Ambulatory Visit: Payer: Self-pay

## 2021-05-27 DIAGNOSIS — Z89612 Acquired absence of left leg above knee: Secondary | ICD-10-CM | POA: Diagnosis not present

## 2021-05-27 DIAGNOSIS — M898X5 Other specified disorders of bone, thigh: Secondary | ICD-10-CM | POA: Diagnosis not present

## 2021-05-27 DIAGNOSIS — S78112A Complete traumatic amputation at level between left hip and knee, initial encounter: Secondary | ICD-10-CM

## 2021-05-27 MED ORDER — OXYCODONE HCL 5 MG PO TABS: 5.0000 mg | ORAL_TABLET | ORAL | 0 refills | Status: AC | PRN

## 2021-05-27 MED ORDER — DOXYCYCLINE HYCLATE 100 MG PO TABS: 100.0000 mg | ORAL_TABLET | Freq: Two times a day (BID) | ORAL | 0 refills | Status: AC

## 2021-05-27 NOTE — Telephone Encounter (Signed)
[name] spoke with pt and she is coming in this afternoon to speak with Dr. Sharol Given.

## 2021-05-28 ENCOUNTER — Encounter: Payer: Self-pay | Admitting: Orthopedic Surgery

## 2021-05-28 LAB — CBC WITH DIFFERENTIAL/PLATELET
Absolute Monocytes: 624 cells/uL (ref 200–950)
Basophils Absolute: 59 cells/uL (ref 0–200)
Basophils Relative: 0.6 %
Eosinophils Absolute: 317 cells/uL (ref 15–500)
Eosinophils Relative: 3.2 %
HCT: 37.8 % (ref 35.0–45.0)
Hemoglobin: 11.5 g/dL — ABNORMAL LOW (ref 11.7–15.5)
Lymphs Abs: 1891 cells/uL (ref 850–3900)
MCH: 23.6 pg — ABNORMAL LOW (ref 27.0–33.0)
MCHC: 30.4 g/dL — ABNORMAL LOW (ref 32.0–36.0)
MCV: 77.6 fL — ABNORMAL LOW (ref 80.0–100.0)
MPV: 11.4 fL (ref 7.5–12.5)
Monocytes Relative: 6.3 %
Neutro Abs: 7009 cells/uL (ref 1500–7800)
Neutrophils Relative %: 70.8 %
Platelets: 353 10*3/uL (ref 140–400)
RBC: 4.87 10*6/uL (ref 3.80–5.10)
RDW: 18.3 % — ABNORMAL HIGH (ref 11.0–15.0)
Total Lymphocyte: 19.1 %
WBC: 9.9 10*3/uL (ref 3.8–10.8)

## 2021-05-28 LAB — C-REACTIVE PROTEIN: CRP: 3.6 mg/L (ref <8.0)

## 2021-05-28 LAB — SEDIMENTATION RATE: Sed Rate: 9 mm/h (ref 0–30)

## 2021-06-01 ENCOUNTER — Encounter: Payer: Self-pay | Admitting: Orthopedic Surgery

## 2021-06-01 NOTE — Progress Notes (Signed)
Office Visit Note   [name]           Date of Birth: [birth year]           MRN: [id number] Visit Date: 05/27/2021              Requested by: [name], MD [address] [place] [address],  [place] [address] PCP: [name], MD  Chief Complaint  Patient presents with   Left Leg - Follow-up    CT scan review Left AKA revision 11/29/2020      HPI: Patient is a 66 year old woman who states that she is having redness and swelling in the left above-the-knee amputation revision.  She is status post a CT scan.  Assessment & Plan: Visit Diagnoses:  1. Pain of left femur   2. 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[place])     Plan: Recommend patient resume using the [employer] to see if this will help get the swelling down.  We will obtain a CBC sed rate and C-reactive protein to see if there are acute inflammatory markers that would be indicated of infection.  These inflammatory markers returned as normal.  We will continue to follow the residual limb expectantly.  A prescription was provided for oxycodone.  Follow-Up Instructions: Return in about 2 weeks (around 06/10/2021).   Ortho Exam  Patient is alert, oriented, no adenopathy, well-dressed, normal affect, normal respiratory effort. Examination the incision is well-healed on the left above-knee amputation there is no redness no drainage there is no pain to palpation she does have swelling.  Patient does have an elevated alkaline phosphatase.  Review of the CT scan does show some edema that is consistent with either a hematoma or possible infection.  Laboratory evaluation shows a normal CBC normal sed rate and a normal C-reactive protein.  Imaging: No results found. No images are attached to the encounter.  Labs: Lab Results  Component Value Date   HGBA1C 5.7 (H) 09/13/2020   ESRSEDRATE 9 05/27/2021   ESRSEDRATE 17 10/07/2020   CRP 3.6 05/27/2021   CRP 42.9 (H) 10/07/2020   REPTSTATUS  09/16/2020 FINAL 09/11/2020   GRAMSTAIN  09/11/2020    RARE WBC PRESENT, PREDOMINANTLY PMN NO ORGANISMS SEEN    CULT  09/11/2020    No growth aerobically or anaerobically. Performed at [facility], [employer] [address], [place], [place] [address]      Lab Results  Component Value Date   ALBUMIN 3.3 (L) 03/08/2021   ALBUMIN 3.0 (L) 03/04/2020   ALBUMIN 3.4 (L) 02/25/2020    Lab Results  Component Value Date   MG 2.1 10/07/2020   Lab Results  Component Value Date   VD25OH 28.94 (L) 03/15/2020   VD25OH 18.03 (L) 02/25/2020    No results found for: PREALBUMIN CBC EXTENDED Latest Ref Rng & Units 05/27/2021 03/08/2021 12/01/2020  WBC 3.8 - 10.8 Thousand/uL 9.9 8.7 6.4  RBC 3.80 - 5.10 Million/uL 4.87 4.64 3.09(L)  HGB 11.7 - 15.5 g/dL 11.5(L) 10.6(L) 8.1(L)  HCT 35.0 - 45.0 % 37.8 36.0 26.7(L)  PLT 140 - 400 Thousand/uL 353 317 312  NEUTROABS 1,500 - 7,800 cells/uL 7,009 6.7 -  LYMPHSABS 850 - 3,900 cells/uL 1,891 1.3 -     There is no height or weight on file to calculate BMI.  Orders:  Orders Placed This Encounter  Procedures   CBC with Differential   C-reactive protein   Sed Rate (ESR)   Meds  ordered this encounter  Medications   oxyCODONE (OXY IR/ROXICODONE) 5 MG immediate release tablet    Sig: Take 1 tablet (5 mg total) by mouth every 4 (four) hours as needed for severe pain.    Dispense:  30 tablet    Refill:  0     Procedures: No procedures performed  Clinical Data: No additional findings.  ROS:  All other systems negative, except as noted in the HPI. Review of Systems  Objective: Vital Signs: There were no vitals taken for this visit.  Specialty Comments:  No specialty comments available.  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 ([facility])    Arthritis of left knee 09/20/2020   Septic arthritis of knee, left ([place]) 09/17/2020   Cellulitis and abscess of lower extremity 09/11/2020   Debility 05/02/2020   Urinary incontinence 05/02/2020   Pain in left shin    Urinary frequency    Sleep disturbance    Pain    Anxiety state    Urinary urgency    Drug induced constipation    Post-operative pain    Benign essential HTN    Pruritus    Left medial tibial plateau fracture 03/01/2020   Morbid obesity ([place])    Acute blood loss anemia    Postoperative pain    Tibial plateau fracture 02/25/2020   Tibial plateau fracture, left 02/25/2020   Hyponatremia 02/25/2020   Alcohol use disorder, moderate, dependence ([place]) 02/25/2020   Essential hypertension 02/25/2020   Past Medical History:  Diagnosis Date   Anemia    Anxiety    Arthritis    Asthma    no longer treated   Bipolar disorder ([place])    off meds for 4-5 years   Depression    Hypertension     History reviewed. No pertinent family history.  Past Surgical History:  Procedure Laterality Date   ABDOMINAL HYSTERECTOMY     AMPUTATION Left 10/18/2020   Procedure: LEFT ABOVE KNEE AMPUTATION;  Surgeon: Newt Minion, MD;  Location: [facility];  Service: Orthopedics;  Laterality: Left;   BREAST SURGERY     left lumpectomy    gastric bypass surgery      HARDWARE REMOVAL Left 08/28/2020   Procedure: HARDWARE REMOVAL;  Surgeon: Hiram Gash, MD;  Location: [facility];  Service: Orthopedics;  Laterality: Left;   hemorrhjoid surgery      IRRIGATION AND DEBRIDEMENT ABSCESS Left 09/11/2020   Procedure: Irrigation and debridement of deep abscess- left leg, septic joint;  Surgeon: Hiram Gash, MD;  Location: [facility];  Service: Orthopedics;  Laterality: Left;    KNEE ARTHROSCOPY WITH MEDIAL MENISECTOMY Left 08/28/2020   Procedure: KNEE ARTHROSCOPY WITH CHONDROPLASTY, MANIPULATION WITH LYSIS OF ADHESIONS, POSSIBLE MEDIAL MENISECTOMY;  Surgeon: Hiram Gash, MD;  Location: [facility];  Service: Orthopedics;  Laterality: Left;   left femur fracture surgery      x 2   ORIF TIBIA PLATEAU Left 02/26/2020   Procedure: OPEN REDUCTION INTERNAL FIXATION (ORIF) TIBIAL PLATEAU;  Surgeon: Shona Needles, MD;  Location:  [facility];  Service: Orthopedics;  Laterality: Left;   STUMP REVISION Left 11/29/2020   Procedure: REVISION LEFT ABOVE KNEE AMPUTATION with removal of deep hardware;  Surgeon: Newt Minion, MD;  Location: [facility];  Service: Orthopedics;  Laterality: Left;   TUBAL LIGATION     Social History   Occupational History   Not on file  Tobacco Use   Smoking status: Former    Types: Cigars    Quit date: 11/14/2020    Years since quitting: 0.5   Smokeless tobacco: Never  Vaping Use   Vaping Use: Former   Substances: Nicotine, Flavoring  Substance and Sexual Activity   Alcohol use: Yes    Alcohol/week: 18.0 standard drinks    Types: 18 Cans of beer per week    Comment: 18 beers per week   Drug use: Never   Sexual activity: Not on file

## 2021-06-04 ENCOUNTER — Other Ambulatory Visit: Payer: BC Managed Care – PPO

## 2021-06-08 ENCOUNTER — Encounter (HOSPITAL_COMMUNITY): Payer: Self-pay

## 2021-06-08 ENCOUNTER — Emergency Department (HOSPITAL_COMMUNITY): Payer: BC Managed Care – PPO

## 2021-06-08 ENCOUNTER — Emergency Department (HOSPITAL_COMMUNITY)
Admission: EM | Admit: 2021-06-08 | Discharge: 2021-06-08 | Disposition: A | Discharge status: Home / Self Care | Payer: BC Managed Care – PPO | Attending: Emergency Medicine | Admitting: Emergency Medicine

## 2021-06-08 ENCOUNTER — Other Ambulatory Visit: Payer: Self-pay

## 2021-06-08 DIAGNOSIS — D270 Benign neoplasm of right ovary: Secondary | ICD-10-CM | POA: Diagnosis not present

## 2021-06-08 DIAGNOSIS — K2900 Acute gastritis without bleeding: Secondary | ICD-10-CM

## 2021-06-08 DIAGNOSIS — I10 Essential (primary) hypertension: Secondary | ICD-10-CM | POA: Diagnosis not present

## 2021-06-08 DIAGNOSIS — R197 Diarrhea, unspecified: Secondary | ICD-10-CM | POA: Insufficient documentation

## 2021-06-08 DIAGNOSIS — D649 Anemia, unspecified: Secondary | ICD-10-CM | POA: Insufficient documentation

## 2021-06-08 DIAGNOSIS — R109 Unspecified abdominal pain: Secondary | ICD-10-CM

## 2021-06-08 DIAGNOSIS — R1013 Epigastric pain: Secondary | ICD-10-CM

## 2021-06-08 DIAGNOSIS — Z79899 Other long term (current) drug therapy: Secondary | ICD-10-CM | POA: Insufficient documentation

## 2021-06-08 LAB — URINALYSIS, ROUTINE W REFLEX MICROSCOPIC
Bilirubin Urine: NEGATIVE
Glucose, UA: NEGATIVE mg/dL
Hgb urine dipstick: NEGATIVE
Ketones, ur: NEGATIVE mg/dL
Leukocytes,Ua: NEGATIVE
Nitrite: NEGATIVE
Protein, ur: NEGATIVE mg/dL
Specific Gravity, Urine: 1.019 (ref 1.005–1.030)
pH: 5 (ref 5.0–8.0)

## 2021-06-08 LAB — COMPREHENSIVE METABOLIC PANEL
ALT: 15 U/L (ref 0–44)
AST: 12 U/L — ABNORMAL LOW (ref 15–41)
Albumin: 3.5 g/dL (ref 3.5–5.0)
Alkaline Phosphatase: 95 U/L (ref 38–126)
Anion gap: 9 (ref 5–15)
BUN: 13 mg/dL (ref 8–23)
CO2: 26 mmol/L (ref 22–32)
Calcium: 8.9 mg/dL (ref 8.9–10.3)
Chloride: 107 mmol/L (ref 98–111)
Creatinine, Ser: 0.57 mg/dL (ref 0.44–1.00)
GFR, Estimated: >60 mL/min (ref >60)
Glucose, Bld: 104 mg/dL — ABNORMAL HIGH (ref 70–99)
Potassium: 3.7 mmol/L (ref 3.5–5.1)
Sodium: 142 mmol/L (ref 135–145)
Total Bilirubin: 0.4 mg/dL (ref 0.3–1.2)
Total Protein: 6.6 g/dL (ref 6.5–8.1)

## 2021-06-08 LAB — CBC
HCT: 38.5 % (ref 36.0–46.0)
Hemoglobin: 11.7 g/dL — ABNORMAL LOW (ref 12.0–15.0)
MCH: 24.4 pg — ABNORMAL LOW (ref 26.0–34.0)
MCHC: 30.4 g/dL (ref 30.0–36.0)
MCV: 80.2 fL (ref 80.0–100.0)
Platelets: 305 10*3/uL (ref 150–400)
RBC: 4.8 MIL/uL (ref 3.87–5.11)
RDW: 21.1 % — ABNORMAL HIGH (ref 11.5–15.5)
WBC: 9.3 10*3/uL (ref 4.0–10.5)
nRBC: 0 % (ref 0.0–0.2)

## 2021-06-08 LAB — LIPASE, BLOOD: Lipase: 32 U/L (ref 11–51)

## 2021-06-08 LAB — MAGNESIUM: Magnesium: 2.1 mg/dL (ref 1.7–2.4)

## 2021-06-08 MED ORDER — HYDROMORPHONE HCL 1 MG/ML IJ SOLN
1.0000 mg | Freq: Once | INTRAMUSCULAR | Status: AC
  Administered 2021-06-08: 1 mg via INTRAVENOUS
  Filled 2021-06-08: qty 1

## 2021-06-08 MED ORDER — SODIUM CHLORIDE 0.9 % IV BOLUS
500.0000 mL | Freq: Once | INTRAVENOUS | Status: AC
Start: 2021-06-08 — End: 2021-06-08
  Administered 2021-06-08: 500 mL via INTRAVENOUS

## 2021-06-08 MED ORDER — SUCRALFATE 1 G PO TABS: 1.0000 g | ORAL_TABLET | Freq: Three times a day (TID) | ORAL | 0 refills | Status: AC

## 2021-06-08 MED ORDER — ONDANSETRON HCL 4 MG/2ML IJ SOLN
4.0000 mg | Freq: Once | INTRAMUSCULAR | Status: AC
  Administered 2021-06-08: 4 mg via INTRAVENOUS
  Filled 2021-06-08: qty 2

## 2021-06-08 MED ORDER — PANTOPRAZOLE SODIUM 40 MG PO TBEC: 40.0000 mg | DELAYED_RELEASE_TABLET | Freq: Every day | ORAL | 0 refills | Status: AC

## 2021-06-08 MED ORDER — PANTOPRAZOLE SODIUM 40 MG IV SOLR
40.0000 mg | Freq: Once | INTRAVENOUS | Status: AC
Start: 2021-06-08 — End: 2021-06-08
  Administered 2021-06-08: 40 mg via INTRAVENOUS
  Filled 2021-06-08: qty 10

## 2021-06-08 MED ORDER — IOHEXOL 300 MG/ML  SOLN
100.0000 mL | Freq: Once | INTRAMUSCULAR | Status: AC | PRN
  Administered 2021-06-08: 100 mL via INTRAVENOUS

## 2021-06-08 NOTE — ED Triage Notes (Addendum)
Patient reports LUQ abdominal pain x 4 days. Patient also c/o N/v x 2 days. Patient states she has been having diarrhea x 4 months. Patient states she is incontinent of urine and stool.  Patient's significant other reports that he keeps he patient's prescribed oxycodone (30 day supply) locked up and she is currently o of the medication and is now having abdominal pain.

## 2021-06-08 NOTE — ED Notes (Signed)
Pt gone to CT 

## 2021-06-08 NOTE — ED Provider Notes (Signed)
[facility] Provider Note   CSN: [id number] Arrival date & time: 06/08/21  1804     History  Chief Complaint  Patient presents with   Emesis   Diarrhea   Abdominal Pain    [name] is a 66 y.o. female.  Patient with history of morbid obesity, hypertension, and incontinence presents today with complaints of abdominal pain, nausea, vomiting, and diarrhea. She states that she has been having diarrhea for the past 4 months but has had worsening in the last 2 days, now with approximately 6 episodes of watery diarrhea per day. States that in the past 2 days she also began to have nausea and LUQ abdominal pain that is worsening, no vomiting. States that pain is sharp in nature and does not radiate, is worse with eating. History of gastric bypass and hysterectomy many years ago. Does state that she recently completed a course of doxycycline for infection of her AKA.   The history is provided by the patient. No language interpreter was used.  Emesis Associated symptoms: abdominal pain and diarrhea   Associated symptoms: no chills and no fever   Diarrhea Associated symptoms: abdominal pain and vomiting   Associated symptoms: no chills and no fever   Abdominal Pain Associated symptoms: diarrhea and vomiting   Associated symptoms: no chest pain, no chills, no dysuria, no fever and no shortness of breath   She      Home Medications Prior to Admission medications   Medication Sig Start Date End Date Taking? Authorizing Provider  acetaminophen (TYLENOL) 500 MG tablet Take 500-1,000 mg by mouth every 6 (six) hours as needed (for headaches).    [provider]  ALPRAZolam Duanne Moron) 0.5 MG tablet Take 0.5 mg by mouth every 8 (eight) hours as needed for anxiety. Patient not taking: Reported on 03/08/2021 11/13/20   [provider]  amLODipine (NORVASC) 5 MG tablet Take 5 mg by mouth in the morning. 11/19/20   [provider]   ARIPiprazole (ABILIFY) 5 MG tablet Take 1 tablet (5 mg total) by mouth daily. 09/20/20   McBane, Maylene Roes, PA-C  ascorbic acid (VITAMIN C) 1000 MG tablet Take 1 tablet (1,000 mg total) by mouth daily. Patient not taking: Reported on 03/08/2021 10/23/20   Persons, Bevely Palmer, [place]  celecoxib (CELEBREX) 200 MG capsule Take 200 mg by mouth daily.    [provider]  cetirizine (ZYRTEC) 10 MG tablet Take 10 mg by mouth daily as needed (for allergic reactions).    [provider]  Cholecalciferol (VITAMIN D3) 50 MCG (2000 UT) TABS Take by mouth in the morning. Patient not taking: Reported on 03/08/2021    [provider]  cyclobenzaprine (FLEXERIL) 10 MG tablet Take 1 tablet (10 mg total) by mouth 3 (three) times daily as needed for muscle spasms. Patient taking differently: Take 10 mg by mouth at bedtime. 09/19/20   McBane, Maylene Roes, PA-C  diphenhydrAMINE (BENADRYL) 25 mg capsule Take 1 capsule (25 mg total) by mouth every 6 (six) hours as needed for itching (if pain meds cause itching). 10/23/20   Persons, Bevely Palmer, PA  docusate sodium (COLACE) 100 MG capsule Take 100 mg by mouth daily as needed for mild constipation.    [provider]  doxycycline (VIBRA-TABS) 100 MG tablet Take 1 tablet (100 mg total) by mouth 2 (two) times daily. 05/27/21   Suzan Slick, NP  furosemide (LASIX) 20 MG tablet Take 20 mg by mouth daily as needed (fluid  retention). Patient not taking: Reported on 03/08/2021    [provider]  gabapentin (NEURONTIN) 800 MG tablet Take 800 mg by mouth 3 (three) times daily. Patient not taking: Reported on 03/08/2021 02/10/21   [provider]  Multiple Vitamin (MULTIVITAMIN WITH MINERALS) TABS tablet Take 1 tablet by mouth in the morning. Centrum Multivitamin for Women Patient not taking: Reported on 03/08/2021    [provider]  MYRBETRIQ 25 MG TB24 tablet Take 25 mg by mouth in the morning. 11/19/20   [provider]   naloxone [facility]) nasal spray 4 mg/0.1 mL Place 1 spray into the nose as needed (accidental overdose). 11/22/20   [provider]  nicotine (NICODERM CQ - DOSED IN MG/24 HOURS) 21 mg/24hr patch Place 21 mg onto the skin daily as needed (for smoking cessation).    [provider]  nystatin cream (MYCOSTATIN) Apply 1 application topically 3 (three) times daily as needed (irritation).    [provider]  omeprazole (PRILOSEC) 20 MG capsule Take 20 mg by mouth daily as needed (for ingestion).    [provider]  oxyCODONE (OXY IR/ROXICODONE) 5 MG immediate release tablet Take 1 tablet (5 mg total) by mouth every 4 (four) hours as needed for severe pain. 05/27/21   Newt Minion, MD  oxyCODONE 10 MG TABS Take 1-1.5 tablets (10-15 mg total) by mouth every 4 (four) hours as needed for severe pain (pain score 7-10). 12/02/20   Persons, Bevely Palmer, [place]  Potassium Chloride ER 20 MEQ TBCR Take 20 mEq by mouth in the morning. Patient not taking: Reported on 03/08/2021 11/13/20   [provider]  pregabalin (LYRICA) 150 MG capsule Take 150 mg by mouth 3 (three) times daily.    [provider]      Allergies    Morphine and related and Oxycodone    Review of Systems   Review of Systems  Constitutional:  Negative for chills and fever.  Respiratory:  Negative for shortness of breath.   Cardiovascular:  Negative for chest pain.  Gastrointestinal:  Positive for abdominal pain, diarrhea and vomiting. Negative for blood in stool.  Genitourinary:  Negative for dysuria and pelvic pain.  All other systems reviewed and are negative.  Physical Exam Updated Vital Signs BP (!) 180/77 (BP Location: Right Arm)    Pulse 73    Temp 98.2 F (36.8 C) (Oral)    Resp 18    Ht 5\' 8"  (1.727 m)    Wt (!) 158.8 kg    SpO2 98%    BMI 53.22 kg/m  Physical Exam Vitals and nursing note reviewed.  Constitutional:      Comments: Patient somewhat chronically ill appearing resting in  bed in no acute distress  HENT:     Head: Normocephalic and atraumatic.  Cardiovascular:     Rate and Rhythm: Normal rate and regular rhythm.     Heart sounds: Normal heart sounds.  Pulmonary:     Effort: Pulmonary effort is normal. No respiratory distress.     Breath sounds: Normal breath sounds.  Abdominal:     General: Abdomen is flat.     Tenderness: There is generalized abdominal tenderness and tenderness in the left upper quadrant. There is no right CVA tenderness, left CVA tenderness or rebound. Negative signs include Murphy's sign and McBurney's sign.  Skin:    General: Skin is warm and dry.  Neurological:     General: No focal deficit present.     Mental  Status: She is alert.  Psychiatric:        Mood and Affect: Mood normal.        Behavior: Behavior normal.    ED Results / Procedures / Treatments   Labs (all labs ordered are listed, but only abnormal results are displayed) Labs Reviewed  COMPREHENSIVE METABOLIC PANEL - Abnormal; Notable for the following components:      Result Value   Glucose, Bld 104 (*)    AST 12 (*)    All other components within normal limits  CBC - Abnormal; Notable for the following components:   Hemoglobin 11.7 (*)    MCH 24.4 (*)    RDW 21.1 (*)    All other components within normal limits  C DIFFICILE QUICK SCREEN W PCR REFLEX    LIPASE, BLOOD  URINALYSIS, ROUTINE W REFLEX MICROSCOPIC  MAGNESIUM    EKG None  Radiology CT ABDOMEN PELVIS W CONTRAST  Result Date: 06/08/2021 CLINICAL DATA:  Left upper quadrant abdominal pain for 4 days. Nausea, vomiting, and diarrhea. EXAM: CT ABDOMEN AND PELVIS WITH CONTRAST TECHNIQUE: Multidetector CT imaging of the abdomen and pelvis was performed using the standard protocol following bolus administration of intravenous contrast. RADIATION DOSE REDUCTION: This exam was performed according to the departmental dose-optimization program which includes automated exposure control, adjustment of the mA  and/or kV according to patient size and/or use of iterative reconstruction technique. CONTRAST:  182mL OMNIPAQUE IOHEXOL 300 MG/ML  SOLN COMPARISON:  01/14/2008. FINDINGS: Lower chest: Mild atelectasis or scarring is noted at the lung bases. Hepatobiliary: Subcentimeter hypodensities are present in the liver which are too small to further characterize. The gallbladder is without stones. No biliary ductal dilatation. Pancreas: Mild pancreatic atrophy. No pancreatic ductal dilatation or surrounding inflammatory changes. Spleen: Normal in size without focal abnormality. Adrenals/Urinary Tract: Adrenal glands are unremarkable. Kidneys are normal, without renal calculi, focal lesion, or hydronephrosis. A small amount of air is present in the urinary bladder and may be iatrogenic. Stomach/Bowel: Gastric surgery changes are noted. No bowel obstruction, free air, or pneumatosis. A few scattered diverticula are present along the colon without evidence of diverticulitis. The appendix is not visualized on exam, however no inflammatory changes are seen in the right lower quadrant. Vascular/Lymphatic: No significant vascular findings are present. No enlarged abdominal or pelvic lymph nodes. Reproductive: The uterus is surgically absent. There is a heterogeneous fat containing focal lesion in the pelvis adjacent to the sigmoid colon which likely represents an ovarian dermoid cyst and is unchanged from 2009. Other: No abdominopelvic ascites. Musculoskeletal: Subcutaneous fat stranding is noted over the left lateral flank. A rim calcified fat attenuation structure is present in the subcutaneous fat in the anterior abdominal wall in the left lower quadrant, likely representing fat necrosis. Fixation hardware is present in the proximal left femur. Degenerative changes are noted in the thoracolumbar spine. 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 Evaluation of the spinal canal is limited due to artifact. IMPRESSION: 1. No acute intra-abdominal process. 2. Subcutaneous fat stranding along the lateral aspect of the abdominal wall on the left, possible edema, contusion, or cellulitis. Correlation with physical exam is recommended. 3. Right ovarian dermoid cyst, unchanged from 2009. Electronically Signed   By: Brett Fairy M.D.   On: 06/08/2021 21:57    Procedures Procedures    Medications Ordered in ED Medications  ondansetron (ZOFRAN) injection 4 mg (has no administration in time  range)  HYDROmorphone (DILAUDID) injection 1 mg (has no administration in time range)  sodium chloride 0.9 % bolus 500 mL (has no administration in time range)    ED Course/ Medical Decision Making/ A&P                           Medical Decision Making Amount and/or Complexity of Data Reviewed Labs: ordered. Radiology: ordered.  Risk Prescription drug management.   This patient presents to the ED for concern of abdominal pain, nausea, and diarrhea, this involves an extensive number of treatment options, and is a complaint that carries with it a high risk of complications and morbidity.    Co morbidities that complicate the patient evaluation  Morbid obesity, htn   Lab Tests:  I Ordered, and personally interpreted labs.  The pertinent results include:  no leukocytosis, anemia unchanged from baseline. No electrolyte abnormalities or changes in kidney or liver function    Imaging Studies ordered:  I ordered imaging studies including CT abdomen and pelvis with contrast  I independently visualized and interpreted imaging which showed  1. No acute intra-abdominal process. 2. Subcutaneous fat stranding along the lateral aspect of the abdominal wall on the left, possible edema, contusion, or cellulitis. Correlation with physical exam is recommended. 3. Right ovarian dermoid cyst, unchanged from 2009. I agree with the radiologist  interpretation   Medicines ordered and prescription drug management:  I ordered medication including fluids  for dehydration, zofran for nausea/vomiting, and dilaudid for pain Reevaluation of the patient after these medicines showed that the patient improved I have reviewed the patients home medicines and have made adjustments as needed  Upon reassessment, patient still uncomfortable and nauseous. In the absence of acute findings on CT scan, suspect patients symptoms are due to gastritis. Patient without any episodes of diarrhea since she arrived in the ER, therefore low suspicion for C diff. Her LUQ did reveal some signs of very mild cellulititis, however patient states that it has been improving since she completed doxycycline, therefore do not feel further treatment is necessary at this this time. As patients symptoms seem to be mostly chronic in nature, I suspect that she needs close outpatient follow-up with GI. Plan to fluid challenge and if passed will discharge with Zofran and GI referral.  Care handoff to Dr. Kathrynn Humble at shift change. Please see their note for further evaluation and dispo.  This is a shared visit with supervising physician Dr. Kathrynn Humble who has independently evaluated patient & provided guidance in evaluation/management/disposition, in agreement with care   Final Clinical Impression(s) / ED Diagnoses Final diagnoses:  None    Rx / DC Orders ED Discharge Orders     None         Nestor Lewandowsky 06/10/21 0016    Varney Biles, MD 06/15/21 0745

## 2021-06-08 NOTE — ED Notes (Signed)
Pt called out stating she was wet due to suction not working; suction checked and working at this time; pt cleaned and new chuck pads placed under pt

## 2021-06-08 NOTE — Discharge Instructions (Addendum)
It appears that most likely you have gastritis.  The CT scan is not showing any evidence of obstruction, infection, internal bleeding.  Start taking the medications that are prescribed.  You may discontinue omeprazole for now. Call the number provided to follow-up with the GI team as soon as possible.  Return to the ER if you start having bloody stools, dark tarry stools or bloody vomitus, fevers.

## 2021-06-10 ENCOUNTER — Other Ambulatory Visit: Payer: Self-pay

## 2021-06-10 ENCOUNTER — Ambulatory Visit (INDEPENDENT_AMBULATORY_CARE_PROVIDER_SITE_OTHER): Payer: BC Managed Care – PPO | Admitting: Orthopedic Surgery

## 2021-06-10 DIAGNOSIS — M898X5 Other specified disorders of bone, thigh: Secondary | ICD-10-CM

## 2021-06-10 DIAGNOSIS — S78112A Complete traumatic amputation at level between left hip and knee, initial encounter: Secondary | ICD-10-CM

## 2021-06-10 DIAGNOSIS — Z89612 Acquired absence of left leg above knee: Secondary | ICD-10-CM

## 2021-06-16 ENCOUNTER — Encounter: Payer: Self-pay | Admitting: Orthopedic Surgery

## 2021-06-16 NOTE — Progress Notes (Signed)
Office Visit Note   [name]           Date of Birth: June 01, 1955           MRN: [id number] Visit Date: 06/10/2021              Requested by: [name], MD [place],  [place] [address] PCP: [name], MD  Chief Complaint  Patient presents with   Left Leg - Follow-up    Hx AKA      HPI: Patient is a 66 year old woman who presents in follow-up for pain in the residual limb left above-the-knee amputation she has significant swelling she is status post a CT scan she has been on doxycycline twice a day for 2 weeks.  Patient is currently wearing a loosely fitting stump shrinker.  Assessment & Plan: Visit Diagnoses:  1. Pain of left femur   2. 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[place])     Plan: Patient's labs and clinical exam do not seem consistent with abscess.  Recommended going to [facility] for a new tighter shrinker to decrease swelling.  Follow-Up Instructions: Return if symptoms worsen or fail to improve.   Ortho Exam  Patient is alert, oriented, no adenopathy, well-dressed, normal affect, normal respiratory effort. 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  There is no redness or cellulitis in her legs there is no swelling no ulcers.  There is global tenderness to palpation.  CT scan does show a resolving hematoma no destructive bony changes.  Imaging: No results found. No images are attached to the encounter.  Labs: Lab Results  Component Value Date   HGBA1C 5.7 (H) 09/13/2020   ESRSEDRATE 9 05/27/2021   ESRSEDRATE 17 10/07/2020   CRP 3.6 05/27/2021   CRP 42.9 (H) 10/07/2020   REPTSTATUS 09/16/2020 FINAL 09/11/2020   GRAMSTAIN  09/11/2020    RARE WBC PRESENT, PREDOMINANTLY PMN NO ORGANISMS SEEN    CULT  09/11/2020    No growth aerobically or anaerobically. Performed at [facility], [place] [address] , [place] [address]      Lab Results  Component Value Date   ALBUMIN 3.5 06/08/2021   ALBUMIN 3.3 (L) 03/08/2021   ALBUMIN 3.0 (L) 03/04/2020    Lab Results  Component Value Date   MG 2.1 06/08/2021   MG 2.1 10/07/2020   Lab Results  Component Value Date   VD25OH 28.94 (L) 03/15/2020   VD25OH 18.03 (L) 02/25/2020    No results found for: PREALBUMIN CBC EXTENDED Latest Ref Rng & Units 06/08/2021 05/27/2021 03/08/2021  WBC 4.0 - 10.5 K/uL 9.3 9.9 8.7  RBC 3.87 - 5.11 MIL/uL 4.80 4.87 4.64  HGB 12.0 - 15.0 g/dL 11.7(L) 11.5(L) 10.6(L)  HCT 36.0 - 46.0 % 38.5 37.8 36.0  PLT 150 - 400 K/uL 305 353 317  NEUTROABS 1,500 - 7,800 cells/uL - 7,009 6.7  LYMPHSABS 850 - 3,900 cells/uL - 1,891 1.3     There is no height or weight on file to calculate BMI.  Orders:  No orders of the defined types were placed in this encounter.  No orders of the defined types were placed in this encounter.    Procedures: No procedures performed  Clinical Data: No additional findings.  ROS:  All other systems negative,  except as noted in the HPI. Review of Systems  Objective: Vital Signs: There were no vitals taken for this visit.  Specialty Comments:  No specialty comments available.  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 ([facility])    Arthritis of left knee 09/20/2020   Septic arthritis of knee, left ([place]) 09/17/2020   Cellulitis and abscess of lower extremity 09/11/2020   Debility 05/02/2020   Urinary incontinence  05/02/2020   Pain in left shin    Urinary frequency    Sleep disturbance    Pain    Anxiety state    Urinary urgency    Drug induced constipation    Post-operative pain    Benign essential HTN    Pruritus    Left medial tibial plateau fracture 03/01/2020   Morbid obesity ([place])    Acute blood loss anemia    Postoperative pain    Tibial plateau fracture 02/25/2020   Tibial plateau fracture, left 02/25/2020   Hyponatremia 02/25/2020   Alcohol use disorder, moderate, dependence ([place]) 02/25/2020   Essential hypertension 02/25/2020   Past Medical History:  Diagnosis Date   Anemia    Anxiety    Arthritis    Asthma    no longer treated   Bipolar disorder ([place])    off meds for 4-5 years   Depression    Hypertension     History reviewed. No pertinent family history.  Past Surgical History:  Procedure Laterality Date   ABDOMINAL HYSTERECTOMY     AMPUTATION Left 10/18/2020   Procedure: LEFT ABOVE KNEE AMPUTATION;  Surgeon: Newt Minion, MD;  Location: [facility];  Service: Orthopedics;  Laterality: Left;   BREAST SURGERY     left lumpectomy    gastric bypass surgery      HARDWARE REMOVAL Left 08/28/2020   Procedure: HARDWARE REMOVAL;  Surgeon: Hiram Gash, MD;  Location: [facility];  Service: Orthopedics;  Laterality: Left;   hemorrhjoid surgery      IRRIGATION AND DEBRIDEMENT ABSCESS Left 09/11/2020   Procedure: Irrigation and debridement of deep abscess- left leg, septic joint;  Surgeon: Hiram Gash, MD;  Location: [facility];  Service: Orthopedics;  Laterality: Left;   KNEE ARTHROSCOPY WITH MEDIAL MENISECTOMY Left 08/28/2020   Procedure: KNEE ARTHROSCOPY WITH CHONDROPLASTY, MANIPULATION WITH LYSIS OF ADHESIONS, POSSIBLE MEDIAL MENISECTOMY;  Surgeon: Hiram Gash, MD;  Location: [facility];  Service: Orthopedics;  Laterality: Left;   left femur fracture surgery      x 2   ORIF TIBIA PLATEAU Left 02/26/2020   Procedure: OPEN REDUCTION INTERNAL FIXATION (ORIF) TIBIAL PLATEAU;  Surgeon:  Shona Needles, MD;  Location: [facility];  Service: Orthopedics;  Laterality: Left;   STUMP REVISION Left 11/29/2020   Procedure: REVISION LEFT ABOVE KNEE AMPUTATION with removal of deep hardware;  Surgeon: Newt Minion, MD;  Location: [facility];  Service: Orthopedics;  Laterality: Left;   TUBAL LIGATION     Social History   Occupational History   Not on file  Tobacco Use   Smoking status: Former  Types: Cigars    Quit date: 11/14/2020    Years since quitting: 0.5   Smokeless tobacco: Never  Vaping Use   Vaping Use: Former   Substances: Nicotine, Flavoring  Substance and Sexual Activity   Alcohol use: Yes    Alcohol/week: 18.0 standard drinks    Types: 18 Cans of beer per week    Comment: 18 beers per week   Drug use: Never   Sexual activity: Not on file

## 2021-07-11 ENCOUNTER — Other Ambulatory Visit: Payer: Self-pay | Admitting: Gastroenterology

## 2021-07-12 ENCOUNTER — Emergency Department (HOSPITAL_COMMUNITY)
Admission: EM | Admit: 2021-07-12 | Discharge: 2021-07-12 | Disposition: A | Discharge status: Home / Self Care | Payer: BC Managed Care – PPO | Attending: Emergency Medicine | Admitting: Emergency Medicine

## 2021-07-12 ENCOUNTER — Other Ambulatory Visit: Payer: Self-pay

## 2021-07-12 ENCOUNTER — Encounter (HOSPITAL_COMMUNITY): Payer: Self-pay | Admitting: Emergency Medicine

## 2021-07-12 ENCOUNTER — Emergency Department (HOSPITAL_COMMUNITY): Payer: BC Managed Care – PPO

## 2021-07-12 DIAGNOSIS — R110 Nausea: Secondary | ICD-10-CM | POA: Diagnosis not present

## 2021-07-12 DIAGNOSIS — R109 Unspecified abdominal pain: Secondary | ICD-10-CM

## 2021-07-12 DIAGNOSIS — R1012 Left upper quadrant pain: Secondary | ICD-10-CM | POA: Diagnosis present

## 2021-07-12 DIAGNOSIS — R197 Diarrhea, unspecified: Secondary | ICD-10-CM | POA: Diagnosis not present

## 2021-07-12 LAB — LIPASE, BLOOD: Lipase: 23 U/L (ref 11–51)

## 2021-07-12 LAB — COMPREHENSIVE METABOLIC PANEL
ALT: 14 U/L (ref 0–44)
AST: 12 U/L — ABNORMAL LOW (ref 15–41)
Albumin: 3.7 g/dL (ref 3.5–5.0)
Alkaline Phosphatase: 98 U/L (ref 38–126)
Anion gap: 7 (ref 5–15)
BUN: 8 mg/dL (ref 8–23)
CO2: 26 mmol/L (ref 22–32)
Calcium: 8.8 mg/dL — ABNORMAL LOW (ref 8.9–10.3)
Chloride: 107 mmol/L (ref 98–111)
Creatinine, Ser: 0.47 mg/dL (ref 0.44–1.00)
GFR, Estimated: >60 mL/min (ref >60)
Glucose, Bld: 104 mg/dL — ABNORMAL HIGH (ref 70–99)
Potassium: 3.7 mmol/L (ref 3.5–5.1)
Sodium: 140 mmol/L (ref 135–145)
Total Bilirubin: 0.5 mg/dL (ref 0.3–1.2)
Total Protein: 6.3 g/dL — ABNORMAL LOW (ref 6.5–8.1)

## 2021-07-12 LAB — CBC
HCT: 37.3 % (ref 36.0–46.0)
Hemoglobin: 11.5 g/dL — ABNORMAL LOW (ref 12.0–15.0)
MCH: 24.6 pg — ABNORMAL LOW (ref 26.0–34.0)
MCHC: 30.8 g/dL (ref 30.0–36.0)
MCV: 79.7 fL — ABNORMAL LOW (ref 80.0–100.0)
Platelets: 273 10*3/uL (ref 150–400)
RBC: 4.68 MIL/uL (ref 3.87–5.11)
RDW: 19.5 % — ABNORMAL HIGH (ref 11.5–15.5)
WBC: 9.2 10*3/uL (ref 4.0–10.5)
nRBC: 0 % (ref 0.0–0.2)

## 2021-07-12 MED ORDER — DIPHENHYDRAMINE HCL 50 MG/ML IJ SOLN
25.0000 mg | Freq: Once | INTRAMUSCULAR | Status: AC
Start: 2021-07-12 — End: 2021-07-12
  Administered 2021-07-12: 25 mg via INTRAVENOUS
  Filled 2021-07-12: qty 1

## 2021-07-12 MED ORDER — MORPHINE SULFATE (PF) 4 MG/ML IV SOLN
4.0000 mg | Freq: Once | INTRAVENOUS | Status: AC
  Administered 2021-07-12: 4 mg via INTRAVENOUS
  Filled 2021-07-12: qty 1

## 2021-07-12 MED ORDER — IOHEXOL 300 MG/ML  SOLN
100.0000 mL | Freq: Once | INTRAMUSCULAR | Status: AC | PRN
  Administered 2021-07-12: 100 mL via INTRAVENOUS

## 2021-07-12 MED ORDER — AMLODIPINE BESYLATE 5 MG PO TABS
10.0000 mg | ORAL_TABLET | Freq: Once | ORAL | Status: AC
  Administered 2021-07-12: 10 mg via ORAL
  Filled 2021-07-12: qty 2

## 2021-07-12 MED ORDER — SODIUM CHLORIDE 0.9 % IV BOLUS
1000.0000 mL | Freq: Once | INTRAVENOUS | Status: AC
  Administered 2021-07-12: 1000 mL via INTRAVENOUS

## 2021-07-12 NOTE — Discharge Instructions (Signed)
Call your primary care doctor or specialist as discussed in the next 2-3 days.   Return immediately back to the ER if:  Your symptoms worsen within the next 12-24 hours. You develop new symptoms such as new fevers, persistent vomiting, new pain, shortness of breath, or new weakness or numbness, or if you have any other concerns.  

## 2021-07-12 NOTE — ED Triage Notes (Signed)
Reports a 'sick stomach' has upper L abdominal pain, nausea, diarrhea and upper back pain. Saw [facility] yesterday and was okay, but pain started last night.  ?

## 2021-07-12 NOTE — ED Notes (Signed)
Discharge instructions including GI follow up discussed with pt. Pt verbalized understanding with no questions at this time. Pt to go home with family member. Assisted with personal wheelchair to lobby.  ?

## 2021-07-12 NOTE — ED Provider Notes (Signed)
?[facility] DEPT ?Provider Note ? ? ?CSN: [id number] ?Arrival date & time: 07/12/21  1157 ? ?  ? ?History ? ?Chief Complaint  ?Patient presents with  ? Abdominal Pain  ? Diarrhea  ? ? ?[name] is a 66 y.o. female. ? ?Patient presents ER chief complaint of left upper quadrant pain.  Symptoms ongoing for the past 2 to 3 days.  Describes a waxing and waning in intensity.  She went to her gastroenterologist yesterday and the pain seemed to have improved to a lower level.  She had exacerbation of the pain today and presents to the ER.  This is associated with diarrhea nonbloody.  Positive nausea but no vomiting.  No fevers no cough.  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 ? ? ?  ? ?Home Medications ?Prior to Admission medications   ?Medication Sig Start Date End Date Taking? Authorizing Provider  ?acetaminophen (TYLENOL) 500 MG tablet Take 500-1,000 mg by mouth every 6 (six) hours as needed (for headaches).    [provider]  ?amLODipine (NORVASC) 5 MG tablet Take 5 mg by mouth in the morning. 11/19/20   [provider]  ?ascorbic acid (VITAMIN C) 1000 MG tablet Take 1 tablet (1,000 mg total) by mouth daily. ?Patient not taking: Reported on 03/08/2021 10/23/20   Persons, Bevely Palmer, PA  ?busPIRone (BUSPAR) 5 MG tablet Take 5 mg by mouth 3 (three) times daily. 05/06/21   [provider]  ?celecoxib (CELEBREX) 200 MG capsule Take 200 mg by mouth daily.    [provider]  ?Cholecalciferol (VITAMIN D3) 50 MCG (2000 UT) TABS Take by mouth in the morning. ?Patient not taking: Reported on 03/08/2021    [provider]  ?cyclobenzaprine (FLEXERIL) 10 MG tablet Take 1 tablet (10 mg total) by mouth 3 (three) times daily as needed for muscle spasms. ?Patient not taking: Reported on 06/08/2021 09/19/20   Ethelda Chick, PA-C  ?diphenhydrAMINE (BENADRYL) 25 mg capsule Take 1 capsule (25 mg total) by mouth every  6 (six) hours as needed for itching (if pain meds cause itching). ?Patient not taking: Reported on 06/08/2021 10/23/20   Persons, Bevely Palmer, PA  ?doxycycline (VIBRA-TABS) 100 MG tablet Take 1 tablet (100 mg total) by mouth 2 (two) times daily. 05/27/21   Suzan Slick, NP  ?furosemide (LASIX) 20 MG tablet Take 20 mg by mouth daily as needed (fluid retention). ?Patient not taking: Reported on 03/08/2021    [provider]  ?lamoTRIgine (LAMICTAL) 100 MG tablet Take 100 mg by mouth daily. 05/07/21   [provider]  ?lithium carbonate (LITHOBID) 300 MG CR tablet Take 900 mg by mouth at bedtime. 05/06/21   [provider]  ?MYRBETRIQ 25 MG TB24 tablet Take 25 mg by mouth in the morning. ?Patient not taking: Reported on 06/08/2021 11/19/20   [provider]  ?naloxone [facility]) nasal spray 4 mg/0.1 mL Place 1 spray into the nose as needed (accidental overdose). ?Patient not taking: Reported on 06/08/2021 11/22/20   [provider]  ?nystatin cream (MYCOSTATIN) Apply 1 application topically 3 (three) times daily as needed (irritation).    [provider]  ?oxyCODONE (OXY IR/ROXICODONE) 5 MG immediate release tablet Take 1 tablet (5 mg total) by mouth every 4 (four) hours as needed for severe pain. 05/27/21   Newt Minion, MD  ?pantoprazole (PROTONIX) 40 MG tablet Take 1 tablet (40 mg total) by mouth daily. 06/08/21  Varney Biles, MD  ?pregabalin (LYRICA) 150 MG capsule Take 150 mg by mouth 3 (three) times daily.    [provider]  ?sucralfate (CARAFATE) 1 g tablet Take 1 tablet (1 g total) by mouth 4 (four) times daily -  with meals and at bedtime. 06/08/21   Varney Biles, MD  ?   ? ?Allergies    ?Morphine and related and Oxycodone   ? ?Review of Systems   ?Review of Systems  ?Constitutional:  Negative for fever.  ?HENT:  Negative for ear pain.   ?Eyes:  Negative for pain.  ?Respiratory:  Negative for cough.   ?Cardiovascular:  Negative for chest pain.   ?Gastrointestinal:  Positive for abdominal pain.  ?Genitourinary:  Negative for flank pain.  ?Musculoskeletal:  Negative for back pain.  ?Skin:  Negative for rash.  ?Neurological:  Negative for headaches.  ? ?Physical Exam ?Updated Vital Signs ?BP (!) 147/57   Pulse 75   Temp 97.8 ?F (36.6 ?C) (Oral)   Resp 18   Ht '5\' 8"'$  (1.727 m)   Wt (!) 158.8 kg   SpO2 96%   BMI 53.22 kg/m?  ?Physical Exam ?Constitutional:   ?   General: She is not in acute distress. ?   Appearance: Normal appearance.  ?HENT:  ?   Head: Normocephalic.  ?   Nose: Nose normal.  ?Eyes:  ?   Extraocular Movements: Extraocular movements intact.  ?Cardiovascular:  ?   Rate and Rhythm: Normal rate.  ?Pulmonary:  ?   Effort: Pulmonary effort is normal.  ?Abdominal:  ?   Comments: Obese abdomen, moderate tenderness palpation in the left upper quadrant region.  No rebound noted.  ?Musculoskeletal:     ?   General: Normal range of motion.  ?   Cervical back: Normal range of motion.  ?Neurological:  ?   General: No focal deficit present.  ?   Mental Status: She is alert. Mental status is at baseline.  ? ? ?ED Results / Procedures / Treatments   ?Labs ?(all labs ordered are listed, but only abnormal results are displayed) ?Labs Reviewed  ?COMPREHENSIVE METABOLIC PANEL - Abnormal; Notable for the following components:  ?    Result Value  ? Glucose, Bld 104 (*)   ? Calcium 8.8 (*)   ? Total Protein 6.3 (*)   ? AST 12 (*)   ? All other components within normal limits  ?CBC - Abnormal; Notable for the following components:  ? Hemoglobin 11.5 (*)   ? MCV 79.7 (*)   ? MCH 24.6 (*)   ? RDW 19.5 (*)   ? All other components within normal limits  ?LIPASE, BLOOD  ?URINALYSIS, ROUTINE W REFLEX MICROSCOPIC  ? ? ?EKG ?None ? ?Radiology ?CT ABDOMEN PELVIS W CONTRAST ? ?Result Date: 07/12/2021 ?CLINICAL DATA:  Left upper quadrant pain, nausea, diarrhea EXAM: CT ABDOMEN AND PELVIS WITH CONTRAST TECHNIQUE: Multidetector CT imaging of the abdomen and pelvis was  performed using the standard protocol following bolus administration of intravenous contrast. RADIATION DOSE REDUCTION: This exam was performed according to the departmental dose-optimization program which includes automated exposure control, adjustment of the mA and/or kV according to patient size and/or use of iterative reconstruction technique. CONTRAST:  178m OMNIPAQUE IOHEXOL 300 MG/ML  SOLN COMPARISON:  06/08/2021 FINDINGS: Lower chest: No acute pleural or parenchymal lung disease. Hepatobiliary: No focal liver abnormality is seen. No gallstones, gallbladder wall thickening, or biliary dilatation. Pancreas: Unremarkable. No pancreatic ductal dilatation or surrounding inflammatory changes. Spleen:  Normal in size without focal abnormality. Adrenals/Urinary Tract: Adrenal glands are unremarkable. Kidneys are normal, without renal calculi, focal lesion, or hydronephrosis. Bladder is unremarkable. Stomach/Bowel: No bowel obstruction or ileus. Scattered diverticulosis of the descending and sigmoid colon without evidence of acute diverticulitis. Normal retrocecal appendix. No bowel wall thickening or inflammatory change. Postsurgical changes from prior bariatric surgery. Vascular/Lymphatic: No significant vascular findings are present. No enlarged abdominal or pelvic lymph nodes. Reproductive: Status post hysterectomy. No adnexal masses. Other: No free fluid or free intraperitoneal gas. No abdominal wall hernia. Musculoskeletal: Subcutaneous edema is again seen within the bilateral flanks, partially excluded by field of view limitations and body habitus. Findings favor dependent edema given lack of significant change since prior exam. Chronic posttraumatic and postsurgical changes are seen at the left hip. There are no acute bony abnormalities. Reconstructed images demonstrate no additional findings. IMPRESSION: 1. No acute intra-abdominal or intrapelvic process. 2. Distal colonic diverticulosis without  diverticulitis. 3. Postsurgical changes related to bariatric surgery. Electronically Signed   By: Randa Ngo M.D.   On: 07/12/2021 18:52   ? ?Procedures ?Procedures  ? ? ?Medications Ordered in ED ?Medications  ?morphine (PF) 4 MG/ML i

## 2021-07-12 NOTE — ED Provider Triage Note (Signed)
Emergency Medicine Provider Triage Evaluation Note ? ?[name] , a 66 y.o. female  was evaluated in triage.  Pt complains of abdominal pain and diarrhea ? ?Review of Systems  ?Positive: nausea ?Negative: vomiting ? ?Physical Exam  ?BP (!) 189/88 (BP Location: Right Arm)   Pulse 74   Temp 97.8 ?F (36.6 ?C) (Oral)   Resp 18   Ht '5\' 8"'$  (1.727 m)   Wt (!) 158.8 kg   SpO2 95%   BMI 53.22 kg/m?  ?Gen:   Awake, no distress   ?Resp:  Normal effort  ?MSK:   Moves extremities without difficulty  ?Other:   ? ?Medical Decision Making  ?Medically screening exam initiated at 12:28 PM.  Appropriate orders placed.  Kathrynn Backstrom was informed that the remainder of the evaluation will be completed by another provider, this initial triage assessment does not replace that evaluation, and the importance of remaining in the ED until their evaluation is complete. ? ? ?  ?Fransico Meadow, [place] ?07/12/21 1229 ? ?

## 2021-09-04 ENCOUNTER — Telehealth: Payer: BC Managed Care – PPO | Admitting: Physician Assistant

## 2021-09-04 DIAGNOSIS — L03115 Cellulitis of right lower limb: Secondary | ICD-10-CM

## 2021-09-04 DIAGNOSIS — R21 Rash and other nonspecific skin eruption: Secondary | ICD-10-CM | POA: Diagnosis not present

## 2021-09-04 MED ORDER — CEPHALEXIN 500 MG PO CAPS: 500.0000 mg | ORAL_CAPSULE | Freq: Two times a day (BID) | ORAL | 0 refills | Status: AC

## 2021-09-04 MED ORDER — PREDNISONE 10 MG (21) PO TBPK: ORAL_TABLET | ORAL | 0 refills | Status: AC

## 2021-09-04 NOTE — Patient Instructions (Signed)
[name], thank you for joining Mar Daring, PA-C for today's virtual visit.  While this provider is not your primary care provider (PCP), if your PCP is located in our provider database this encounter information will be shared with them immediately following your visit.  Consent: (Patient) [name] provided verbal consent for this virtual visit at the beginning of the encounter.  Current Medications:  Current Outpatient Medications:    cephALEXin (KEFLEX) 500 MG capsule, Take 1 capsule (500 mg total) by mouth 2 (two) times daily., Disp: 20 capsule, Rfl: 0   predniSONE (STERAPRED UNI-PAK 21 TAB) 10 MG (21) TBPK tablet, 6 day taper; take as directed on package instructions, Disp: 21 tablet, Rfl: 0   acetaminophen (TYLENOL) 500 MG tablet, Take 500-1,000 mg by mouth every 6 (six) hours as needed (for headaches)., Disp: , Rfl:    amLODipine (NORVASC) 5 MG tablet, Take 5 mg by mouth in the morning., Disp: , Rfl:    ascorbic acid (VITAMIN C) 1000 MG tablet, Take 1 tablet (1,000 mg total) by mouth daily. (Patient not taking: Reported on 03/08/2021), Disp: , Rfl:    busPIRone (BUSPAR) 5 MG tablet, Take 5 mg by mouth 3 (three) times daily., Disp: , Rfl:    celecoxib (CELEBREX) 200 MG capsule, Take 200 mg by mouth daily., Disp: , Rfl:    Cholecalciferol (VITAMIN D3) 50 MCG (2000 UT) TABS, Take by mouth in the morning. (Patient not taking: Reported on 03/08/2021), Disp: , Rfl:    cyclobenzaprine (FLEXERIL) 10 MG tablet, Take 1 tablet (10 mg total) by mouth 3 (three) times daily as needed for muscle spasms. (Patient not taking: Reported on 06/08/2021), Disp: 30 tablet, Rfl: 0   diphenhydrAMINE (BENADRYL) 25 mg capsule, Take 1 capsule (25 mg total) by mouth every 6 (six) hours as needed for itching (if pain meds cause itching). (Patient not taking: Reported on 06/08/2021), Disp: 30 capsule, Rfl: 0   doxycycline (VIBRA-TABS) 100 MG tablet, Take 1 tablet (100 mg total) by mouth 2 (two) times  daily., Disp: 60 tablet, Rfl: 0   furosemide (LASIX) 20 MG tablet, Take 20 mg by mouth daily as needed (fluid retention). (Patient not taking: Reported on 03/08/2021), Disp: , Rfl:    lamoTRIgine (LAMICTAL) 100 MG tablet, Take 100 mg by mouth daily., Disp: , Rfl:    lithium carbonate (LITHOBID) 300 MG CR tablet, Take 900 mg by mouth at bedtime., Disp: , Rfl:    MYRBETRIQ 25 MG TB24 tablet, Take 25 mg by mouth in the morning. (Patient not taking: Reported on 06/08/2021), Disp: , Rfl:    naloxone (NARCAN) nasal spray 4 mg/0.1 mL, Place 1 spray into the nose as needed (accidental overdose). (Patient not taking: Reported on 06/08/2021), Disp: , Rfl:    nystatin cream (MYCOSTATIN), Apply 1 application topically 3 (three) times daily as needed (irritation)., Disp: , Rfl:    oxyCODONE (OXY IR/ROXICODONE) 5 MG immediate release tablet, Take 1 tablet (5 mg total) by mouth every 4 (four) hours as needed for severe pain., Disp: 30 tablet, Rfl: 0   pantoprazole (PROTONIX) 40 MG tablet, Take 1 tablet (40 mg total) by mouth daily., Disp: 60 tablet, Rfl: 0   pregabalin (LYRICA) 150 MG capsule, Take 150 mg by mouth 3 (three) times daily., Disp: , Rfl:    sucralfate (CARAFATE) 1 g tablet, Take 1 tablet (1 g total) by mouth 4 (four) times daily -  with meals and at bedtime., Disp: 90 tablet, Rfl: 0   Medications  ordered in this encounter:  Meds ordered this encounter  Medications   predniSONE (STERAPRED UNI-PAK 21 TAB) 10 MG (21) TBPK tablet    Sig: 6 day taper; take as directed on package instructions    Dispense:  21 tablet    Refill:  0    Order Specific Question:   Supervising Provider    Answer:   Sabra Heck, BRIAN [[id number]]   cephALEXin (KEFLEX) 500 MG capsule    Sig: Take 1 capsule (500 mg total) by mouth 2 (two) times daily.    Dispense:  20 capsule    Refill:  0    Order Specific Question:   Supervising Provider    Answer:   Sabra Heck, [place]     *If you need refills on other medications prior to your  next appointment, please contact your pharmacy*  Follow-Up: Call back or seek an in-person evaluation if the symptoms worsen or if the condition fails to improve as anticipated.  Other Instructions Cellulitis, Adult  Cellulitis is a skin infection. The infected area is usually warm, red, swollen, and tender. This condition occurs most often in the arms and lower legs. The infection can travel to the muscles, blood, and underlying tissue and become serious. It is very important to get treated for this condition. What are the causes? Cellulitis is caused by bacteria. The bacteria enter through a break in the skin, such as a cut, burn, insect bite, open sore, or crack. What increases the risk? This condition is more likely to occur in people who: Have a weak body defense system (immune system). Have open wounds on the skin, such as cuts, burns, bites, and scrapes. Bacteria can enter the body through these open wounds. Are older than 66 years of age. Have diabetes. Have a type of long-lasting (chronic) liver disease (cirrhosis) or kidney disease. Are obese. Have a skin condition such as: Itchy rash (eczema). Slow movement of blood in the veins (venous stasis). Fluid buildup below the skin (edema). Have had radiation therapy. Use IV drugs. What are the signs or symptoms? Symptoms of this condition include: Redness, streaking, or spotting on the skin. Swollen area of the skin. Tenderness or pain when an area of the skin is touched. Warm skin. A fever. Chills. Blisters. How is this diagnosed? This condition is diagnosed based on a medical history and physical exam. You may also have tests, including: Blood tests. Imaging tests. How is this treated? Treatment for this condition may include: Medicines, such as antibiotic medicines or medicines to treat allergies (antihistamines). Supportive care, such as rest and application of cold or warm cloths (compresses) to the skin. Hospital  care, if the condition is severe. The infection usually starts to get better within 1-2 days of treatment. Follow these instructions at home:  Medicines Take over-the-counter and prescription medicines only as told by your health care provider. If you were prescribed an antibiotic medicine, take it as told by your health care provider. Do not stop taking the antibiotic even if you start to feel better. General instructions Drink enough fluid to keep your urine pale yellow. Do not touch or rub the infected area. Raise (elevate) the infected area above the level of your heart while you are sitting or lying down. Apply warm or cold compresses to the affected area as told by your health care provider. Keep all follow-up visits as told by your health care provider. This is important. These visits let your health care provider make sure a more serious  infection is not developing. Contact a health care provider if: You have a fever. Your symptoms do not begin to improve within 1-2 days of starting treatment. Your bone or joint underneath the infected area becomes painful after the skin has healed. Your infection returns in the same area or another area. You notice a swollen bump in the infected area. You develop new symptoms. You have a general ill feeling (malaise) with muscle aches and pains. Get help right away if: Your symptoms get worse. You feel very sleepy. You develop vomiting or diarrhea that persists. You notice red streaks coming from the infected area. Your red area gets larger or turns dark in color. These symptoms may represent a serious problem that is an emergency. Do not wait to see if the symptoms will go away. Get medical help right away. Call your local emergency services (911 in the [place] Do not drive yourself to the hospital. Summary Cellulitis is a skin infection. This condition occurs most often in the arms and lower legs. Treatment for this condition may include  medicines, such as antibiotic medicines or antihistamines. Take over-the-counter and prescription medicines only as told by your health care provider. If you were prescribed an antibiotic medicine, do not stop taking the antibiotic even if you start to feel better. Contact a health care provider if your symptoms do not begin to improve within 1-2 days of starting treatment or your symptoms get worse. Keep all follow-up visits as told by your health care provider. This is important. These visits let your health care provider make sure that a more serious infection is not developing. This information is not intended to replace advice given to you by your health care provider. Make sure you discuss any questions you have with your health care provider. Document Revised: 01/16/2021 Document Reviewed: 01/16/2021 Elsevier Patient Education  [place].    If you have been instructed to have an in-person evaluation today at a local Urgent Care facility, please use the link below. It will take you to a list of all of our available [facility], including address, phone number and hours of operation. Please do not delay care.  [facility]  If you or a family member do not have a primary care provider, use the link below to schedule a visit and establish care. When you choose a [facility] primary care physician or advanced practice provider, you gain a long-term partner in health. Find a Primary Care Provider  Learn more about [facility]'s in-office and virtual care options: [facility]

## 2021-09-04 NOTE — Progress Notes (Signed)
Virtual Visit Consent   [name], you are scheduled for a virtual visit with a [facility] provider today. Just as with appointments in the office, your consent must be obtained to participate. Your consent will be active for this visit and any virtual visit you may have with one of our providers in the next 365 days. If you have a MyChart account, a copy of this consent can be sent to you electronically.  As this is a virtual visit, video technology does not allow for your provider to perform a traditional examination. This may limit your provider's ability to fully assess your condition. If your provider identifies any concerns that need to be evaluated in person or the need to arrange testing (such as labs, EKG, etc.), we will make arrangements to do so. Although advances in technology are sophisticated, we cannot ensure that it will always work on either your end or our end. If the connection with a video visit is poor, the visit may have to be switched to a telephone visit. With either a video or telephone visit, we are not always able to ensure that we have a secure connection.  By engaging in this virtual visit, you consent to the provision of healthcare and authorize for your insurance to be billed (if applicable) for the services provided during this visit. Depending on your insurance coverage, you may receive a charge related to this service.  I need to obtain your verbal consent now. Are you willing to proceed with your visit today? Marlene Beidler has provided verbal consent on 09/04/2021 for a virtual visit (video or telephone). Mar Daring, PA-C  Date: 09/04/2021 3:51 PM  Virtual Visit via Video Note   I, Mar Daring, connected with  Stasha Naraine  ([id number], 1955/08/04) on 09/04/21 at  3:45 PM EDT by a video-enabled telemedicine application and verified that I am speaking with the correct person using two identifiers.  Location: Patient: Virtual Visit Location Patient:  Home Provider: Virtual Visit Location Provider: Home Office   I discussed the limitations of evaluation and management by telemedicine and the availability of in person appointments. The patient expressed understanding and agreed to proceed.    History of Present Illness: Kassadi Presswood is a 66 y.o. who identifies as a female who was assigned female at birth, and is being seen today for itchy rash.  HPI: Rash This is a new problem. The current episode started in the past 7 days. The problem has been gradually worsening since onset. The affected locations include the right lower leg and right ankle. The rash is characterized by blistering, redness, swelling, itchiness and draining. It is unknown if there was an exposure to a precipitant. Pertinent negatives include no anorexia, congestion, cough, fatigue, fever, joint pain, shortness of breath or sore throat. Past treatments include antihistamine. The treatment provided no relief.     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 ([facility])    Arthritis of left knee 09/20/2020   Septic arthritis of knee, left ([place]) 09/17/2020   Cellulitis and  abscess of lower extremity 09/11/2020   Debility 05/02/2020   Urinary incontinence 05/02/2020   Pain in left shin    Urinary frequency    Sleep disturbance    Pain    Anxiety state    Urinary urgency    Drug induced constipation    Post-operative pain    Benign essential HTN    Pruritus    Left medial tibial plateau fracture 03/01/2020   Morbid obesity ([place])    Acute blood loss  anemia    Postoperative pain    Tibial plateau fracture 02/25/2020   Tibial plateau fracture, left 02/25/2020   Hyponatremia 02/25/2020   Alcohol use disorder, moderate, dependence ([place]) 02/25/2020   Essential hypertension 02/25/2020    Allergies:  Allergies  Allergen Reactions   Morphine And Related Itching and Other (See Comments)    Tolerates fine with antihistamine (benadryl or cetirizine)    Oxycodone Itching and Other (See Comments)    Tolerates fine with antihistamine (benadryl or cetirizine)    Medications:  Current Outpatient Medications:    cephALEXin (KEFLEX) 500 MG capsule, Take 1 capsule (500 mg total) by mouth 2 (two) times daily., Disp: 20 capsule, Rfl: 0   predniSONE (STERAPRED UNI-PAK 21 TAB) 10 MG (21) TBPK tablet, 6 day taper; take as directed on package instructions, Disp: 21 tablet, Rfl: 0   acetaminophen (TYLENOL) 500 MG tablet, Take 500-1,000 mg by mouth every 6 (six) hours as needed (for headaches)., Disp: , Rfl:    amLODipine (NORVASC) 5 MG tablet, Take 5 mg by mouth in the morning., Disp: , Rfl:    ascorbic acid (VITAMIN C) 1000 MG tablet, Take 1 tablet (1,000 mg total) by mouth daily. (Patient not taking: Reported on 03/08/2021), Disp: , Rfl:    busPIRone (BUSPAR) 5 MG tablet, Take 5 mg by mouth 3 (three) times daily., Disp: , Rfl:    celecoxib (CELEBREX) 200 MG capsule, Take 200 mg by mouth daily., Disp: , Rfl:    Cholecalciferol (VITAMIN D3) 50 MCG (2000 UT) TABS, Take by mouth in the morning. (Patient not taking: Reported on 03/08/2021), Disp: , Rfl:    cyclobenzaprine (FLEXERIL) 10 MG tablet, Take 1 tablet (10 mg total) by mouth 3 (three) times daily as needed for muscle spasms. (Patient not taking: Reported on 06/08/2021), Disp: 30 tablet, Rfl: 0   diphenhydrAMINE (BENADRYL) 25 mg capsule, Take 1 capsule (25 mg total) by mouth every 6 (six) hours as needed for itching (if pain meds cause itching). (Patient not taking: Reported on 06/08/2021), Disp: 30 capsule,  Rfl: 0   doxycycline (VIBRA-TABS) 100 MG tablet, Take 1 tablet (100 mg total) by mouth 2 (two) times daily., Disp: 60 tablet, Rfl: 0   furosemide (LASIX) 20 MG tablet, Take 20 mg by mouth daily as needed (fluid retention). (Patient not taking: Reported on 03/08/2021), Disp: , Rfl:    lamoTRIgine (LAMICTAL) 100 MG tablet, Take 100 mg by mouth daily., Disp: , Rfl:    lithium carbonate (LITHOBID) 300 MG CR tablet, Take 900 mg by mouth at bedtime., Disp: , Rfl:    MYRBETRIQ 25 MG TB24 tablet, Take 25 mg by mouth in the morning. (Patient not taking: Reported on 06/08/2021), Disp: , Rfl:    naloxone (NARCAN) nasal spray 4 mg/0.1 mL, Place 1 spray into the nose as needed (accidental overdose). (Patient not taking: Reported on 06/08/2021), Disp: , Rfl:    nystatin cream (MYCOSTATIN), Apply 1 application topically 3 (three) times daily as needed (irritation)., Disp: ,  Rfl:    oxyCODONE (OXY IR/ROXICODONE) 5 MG immediate release tablet, Take 1 tablet (5 mg total) by mouth every 4 (four) hours as needed for severe pain., Disp: 30 tablet, Rfl: 0   pantoprazole (PROTONIX) 40 MG tablet, Take 1 tablet (40 mg total) by mouth daily., Disp: 60 tablet, Rfl: 0   pregabalin (LYRICA) 150 MG capsule, Take 150 mg by mouth 3 (three) times daily., Disp: , Rfl:    sucralfate (CARAFATE) 1 g tablet, Take 1 tablet (1 g total) by mouth 4 (four) times daily -  with meals and at bedtime., Disp: 90 tablet, Rfl: 0  Observations/Objective: Patient is well-developed, well-nourished in no acute distress.  Resting comfortably at home.  Head is normocephalic, atraumatic.  No labored breathing.  Speech is clear and coherent with logical content.  Patient is alert and oriented at baseline.  Right lower extremity with excoriations, redness, multiple open areas with purulent and serosanginous discharge  Assessment and Plan: 1. Rash and nonspecific skin eruption - predniSONE (STERAPRED UNI-PAK 21 TAB) 10 MG (21) TBPK tablet; 6 day taper;  take as directed on package instructions  Dispense: 21 tablet; Refill: 0 - cephALEXin (KEFLEX) 500 MG capsule; Take 1 capsule (500 mg total) by mouth 2 (two) times daily.  Dispense: 20 capsule; Refill: 0  - Unspecified source, possibly edema  - Prednisone prescribed for itching, continue antihistamine - Keflex for cellulitis and secondary infection on excoriated lesions - Keep leg elevated - Keep leg clean and dry - Seek in person evaluation if not improving or if worsening  Follow Up Instructions: I discussed the assessment and treatment plan with the patient. The patient was provided an opportunity to ask questions and all were answered. The patient agreed with the plan and demonstrated an understanding of the instructions.  A copy of instructions were sent to the patient via MyChart unless otherwise noted below.   The patient was advised to call back or seek an in-person evaluation if the symptoms worsen or if the condition fails to improve as anticipated.  Time:  I spent 12 minutes with the patient via telehealth technology discussing the above problems/concerns.    Mar Daring, PA-C

## 2021-09-29 NOTE — Progress Notes (Signed)
Pt called to cancel this appointment will call office to reschedule.

## 2021-10-08 ENCOUNTER — Encounter: Payer: Self-pay | Admitting: Infectious Diseases

## 2021-10-28 ENCOUNTER — Encounter (HOSPITAL_COMMUNITY): Payer: Self-pay

## 2021-10-28 ENCOUNTER — Ambulatory Visit (HOSPITAL_COMMUNITY): Admit: 2021-10-28 | Payer: BC Managed Care – PPO | Admitting: Gastroenterology

## 2021-10-28 SURGERY — EGD (ESOPHAGOGASTRODUODENOSCOPY)
Anesthesia: Monitor Anesthesia Care

## 2021-11-24 ENCOUNTER — Telehealth: Payer: Self-pay | Admitting: Orthopedic Surgery

## 2021-11-24 NOTE — Telephone Encounter (Signed)
Pt states she broke a wheelchair out of pocket and her wheelchair is falling aprt and asking for a new script for insurance company to get for her. Please call pt about this matter at [phone].

## 2021-11-26 NOTE — Telephone Encounter (Signed)
Order for wheelchair completed through [employer]. Pending.

## 2021-11-27 NOTE — Telephone Encounter (Signed)
Note from [employer] that pt called to cancel the order.

## 2022-01-18 DEATH — deceased

## 2022-04-14 IMAGING — CT CT ABD-PELV W/ CM
2 of 5 series · 16 of 46 positions shown, 18 images · IV contrast (agent unspecified)
Comparison: 01/14/2008.

CLINICAL DATA: Left upper quadrant abdominal pain for 4 days.
Nausea, vomiting, and diarrhea.

EXAM:
CT ABDOMEN AND PELVIS WITH CONTRAST
TECHNIQUE: Multidetector CT imaging of the abdomen and pelvis was performed
using the standard protocol following bolus administration of
intravenous contrast.

[Series 2: axial st · axial · 0.98mm/px · z∈[-494,-84]mm · 13 of 96 slices shown, 15 images]
[im 7/96  soft-tissue]
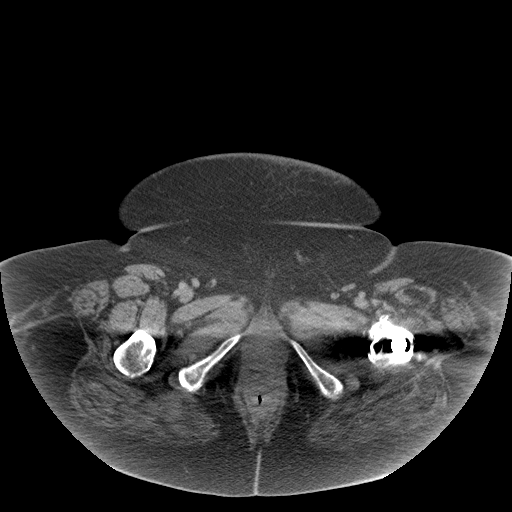
[im 7/96  bone]
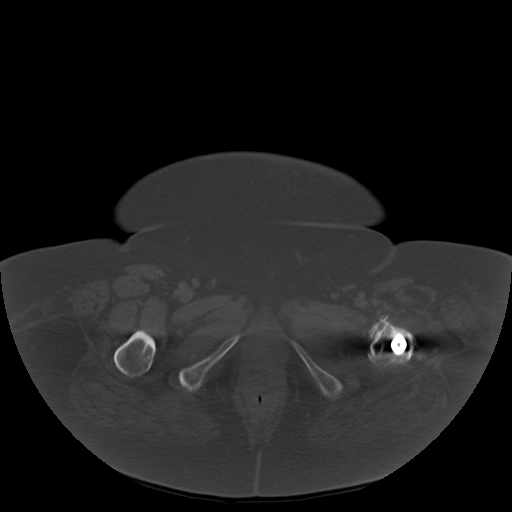
[im 13/96  soft-tissue]
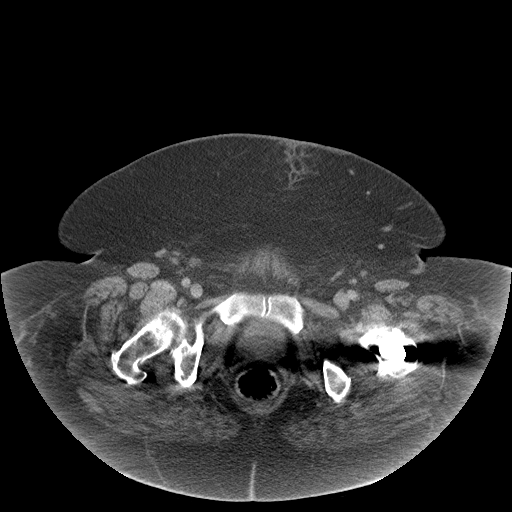
[im 20/96  soft-tissue]
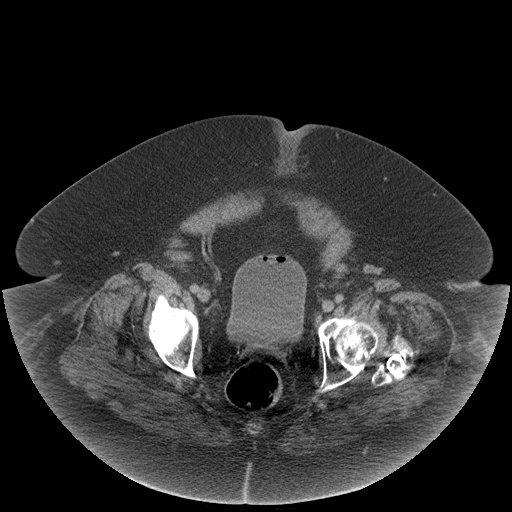
[im 26/96  soft-tissue]
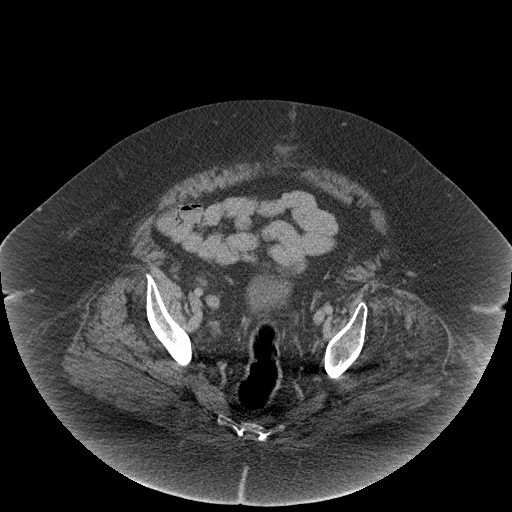
[im 32/96  soft-tissue]
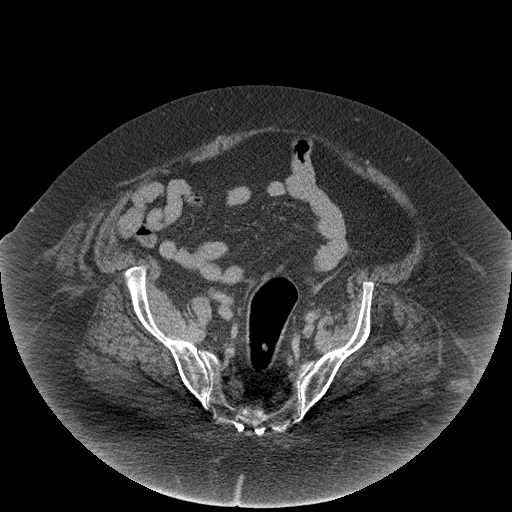
[im 39/96  soft-tissue]
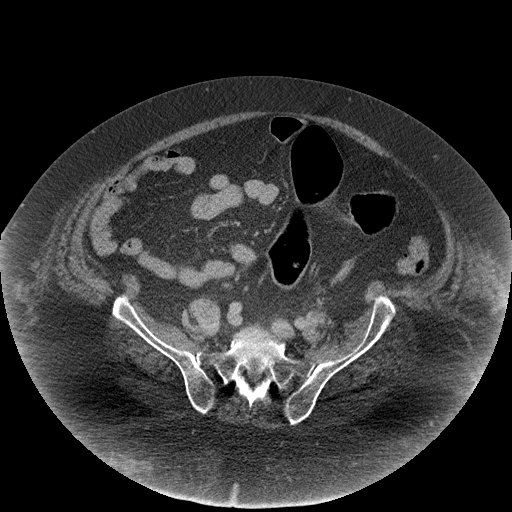
[im 51/96  soft-tissue]
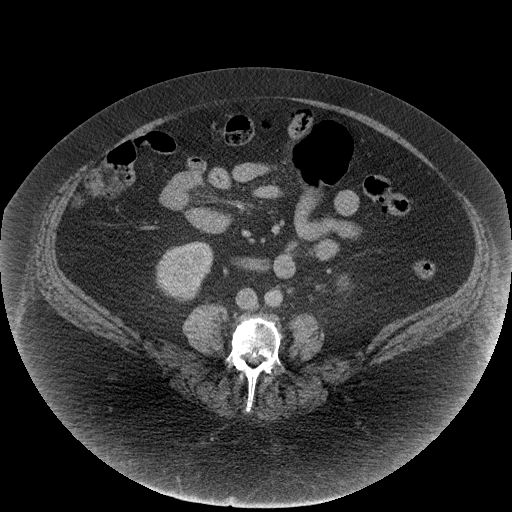
[im 58/96  soft-tissue]
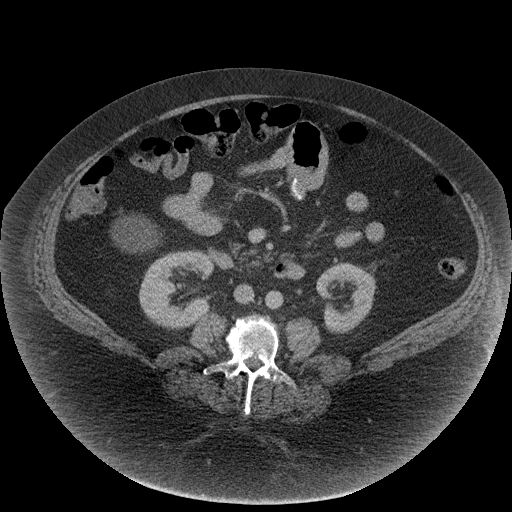
[im 64/96  soft-tissue]
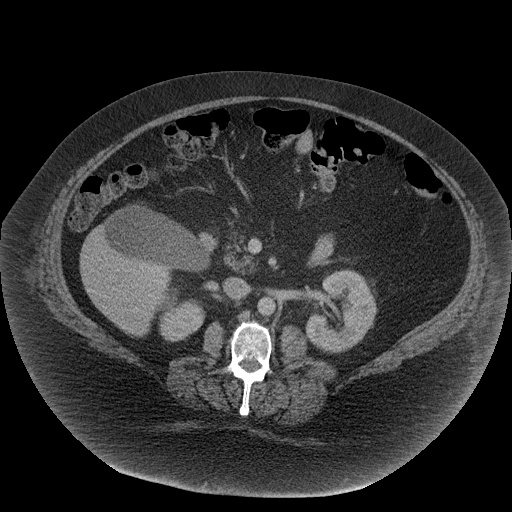
[im 64/96  bone]
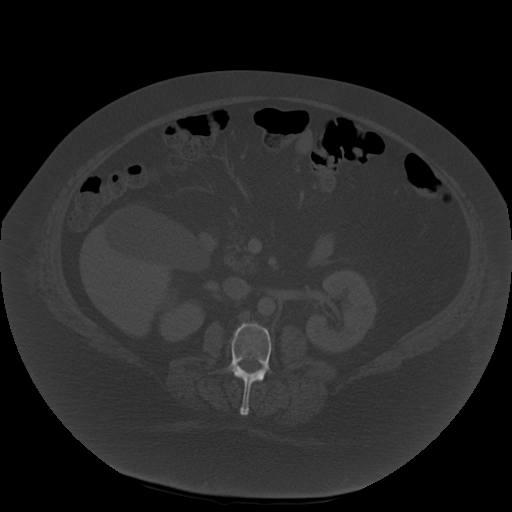
[im 70/96  soft-tissue]
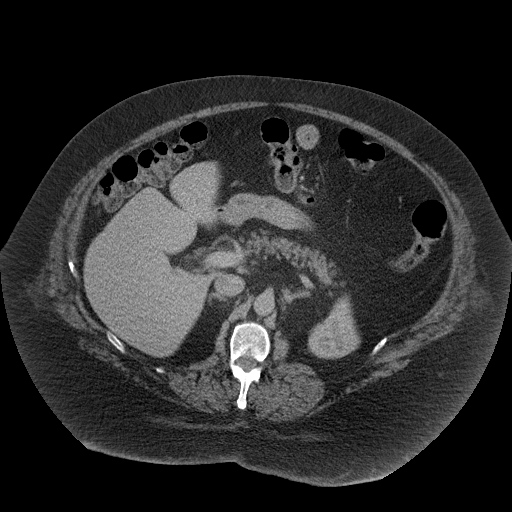
[im 77/96  soft-tissue]
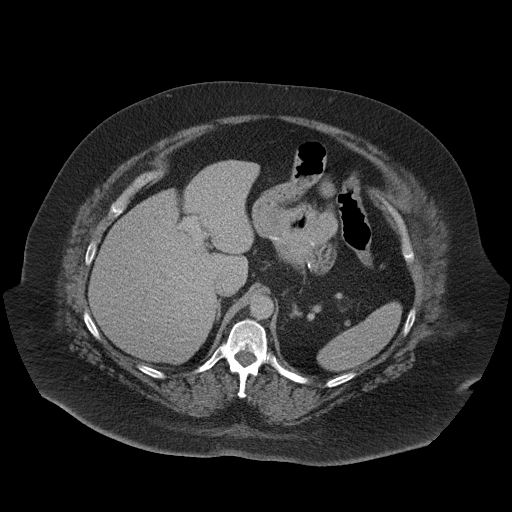
[im 83/96  soft-tissue]
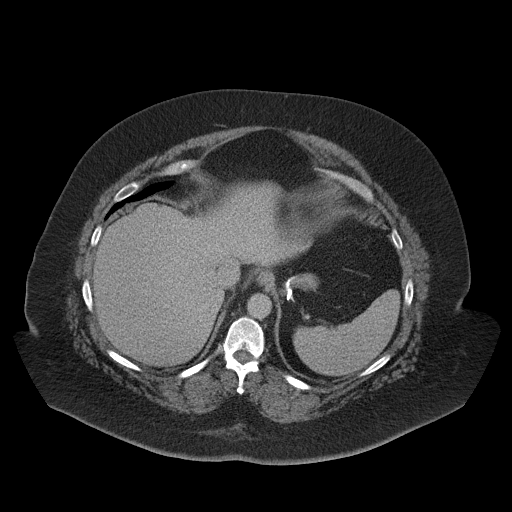
[im 89/96  soft-tissue]
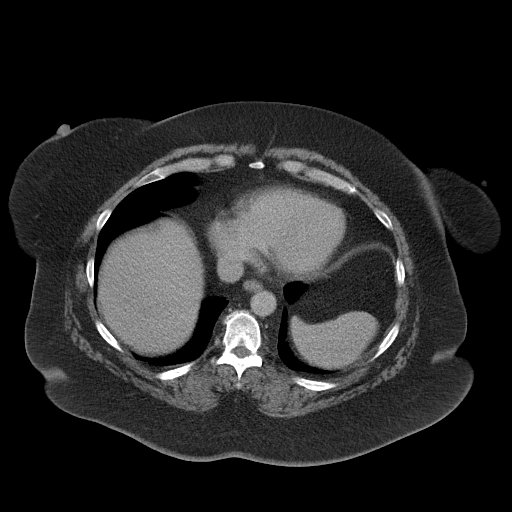

[Series 5: coronal st · coronal · 0.99mm/px · 3 of 207 slices shown]
[im 69/207  soft-tissue]
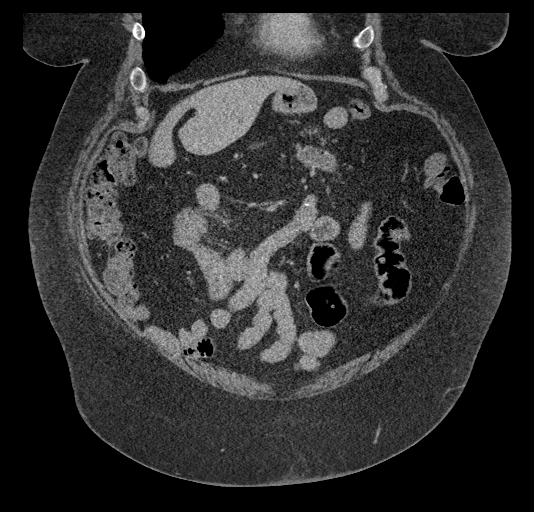
[im 92/207  soft-tissue]
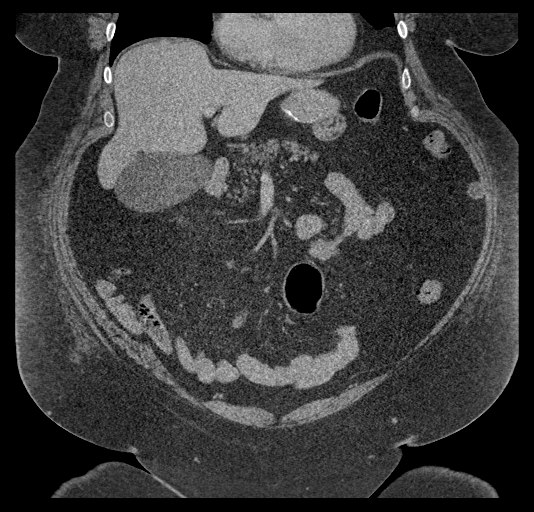
[im 115/207  soft-tissue]
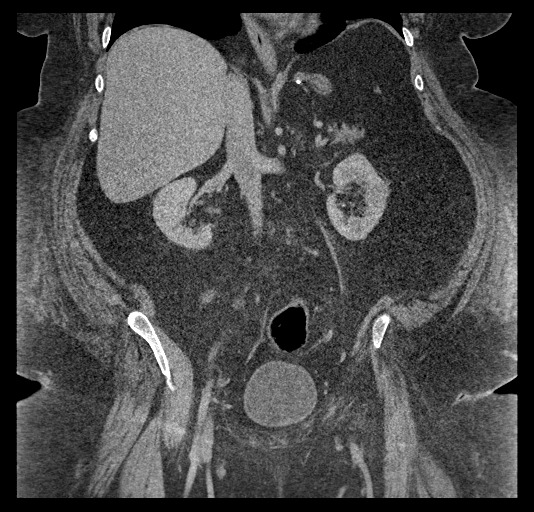

[16 of 46 positions shown; findings below may reference images not displayed]

RADIATION DOSE REDUCTION: This exam was performed according to the
departmental dose-optimization program which includes automated
exposure control, adjustment of the mA and/or kV according to
patient size and/or use of iterative reconstruction technique.

CONTRAST:  100mL OMNIPAQUE IOHEXOL 300 MG/ML  SOLN
FINDINGS: Lower chest: Mild atelectasis or scarring is noted at the lung
bases.

Hepatobiliary: Subcentimeter hypodensities are present in the liver
which are too small to further characterize. The gallbladder is
without stones. No biliary ductal dilatation.

Pancreas: Mild pancreatic atrophy. No pancreatic ductal dilatation
or surrounding inflammatory changes.

Spleen: Normal in size without focal abnormality.

Adrenals/Urinary Tract: Adrenal glands are unremarkable. Kidneys are
normal, without renal calculi, focal lesion, or hydronephrosis. A
small amount of air is present in the urinary bladder and may be
iatrogenic.

Stomach/Bowel: Gastric surgery changes are noted. No bowel
obstruction, free air, or pneumatosis. A few scattered diverticula
are present along the colon without evidence of diverticulitis. The
appendix is not visualized on exam, however no inflammatory changes
are seen in the right lower quadrant.

Vascular/Lymphatic: No significant vascular findings are present. No
enlarged abdominal or pelvic lymph nodes.

Reproductive: The uterus is surgically absent. There is a
heterogeneous fat containing focal lesion in the pelvis adjacent to
the sigmoid colon which likely represents an ovarian dermoid cyst
and is unchanged from 3008.

Other: No abdominopelvic ascites.

Musculoskeletal: Subcutaneous fat stranding is noted over the left
lateral flank. A rim calcified fat attenuation structure is present
in the subcutaneous fat in the anterior abdominal wall in the left
lower quadrant, likely representing fat necrosis. Fixation hardware
is present in the proximal left femur. Degenerative changes are
noted in the thoracolumbar spine. There are disc herniations with
facet arthropathy and osteophyte formation resulting in moderate to
severe spinal canal stenosis at L3-L4. Evaluation of the spinal
canal is limited due to artifact.
IMPRESSION: 1. No acute intra-abdominal process.
2. Subcutaneous fat stranding along the lateral aspect of the
abdominal wall on the left, possible edema, contusion, or
cellulitis. Correlation with physical exam is recommended.
3. Right ovarian dermoid cyst, unchanged from 3008.
